# Patient Record
Sex: Female | Born: 1990 | Race: White | Hispanic: No | Marital: Married | Smoking: Never smoker
Health system: Southern US, Community
[De-identification: ages and names within clinical notes are randomized; demographics above are authoritative.]

## PROBLEM LIST (undated history)

## (undated) ENCOUNTER — Inpatient Hospital Stay (HOSPITAL_COMMUNITY): Payer: Self-pay

## (undated) DIAGNOSIS — I10 Essential (primary) hypertension: Secondary | ICD-10-CM

## (undated) DIAGNOSIS — J45909 Unspecified asthma, uncomplicated: Secondary | ICD-10-CM

## (undated) DIAGNOSIS — E669 Obesity, unspecified: Secondary | ICD-10-CM

## (undated) DIAGNOSIS — F419 Anxiety disorder, unspecified: Secondary | ICD-10-CM

## (undated) DIAGNOSIS — O139 Gestational [pregnancy-induced] hypertension without significant proteinuria, unspecified trimester: Secondary | ICD-10-CM

## (undated) DIAGNOSIS — O24419 Gestational diabetes mellitus in pregnancy, unspecified control: Secondary | ICD-10-CM

## (undated) DIAGNOSIS — F32A Depression, unspecified: Secondary | ICD-10-CM

## (undated) DIAGNOSIS — D649 Anemia, unspecified: Secondary | ICD-10-CM

## (undated) DIAGNOSIS — F329 Major depressive disorder, single episode, unspecified: Secondary | ICD-10-CM

## (undated) DIAGNOSIS — Z8619 Personal history of other infectious and parasitic diseases: Secondary | ICD-10-CM

## (undated) DIAGNOSIS — R87629 Unspecified abnormal cytological findings in specimens from vagina: Secondary | ICD-10-CM

## (undated) HISTORY — DX: Gestational diabetes mellitus in pregnancy, unspecified control: O24.419

## (undated) HISTORY — DX: Unspecified abnormal cytological findings in specimens from vagina: R87.629

## (undated) HISTORY — PX: COLPOSCOPY: SHX161

## (undated) HISTORY — DX: Personal history of other infectious and parasitic diseases: Z86.19

## (undated) HISTORY — PX: TONSILLECTOMY: SUR1361

## (undated) HISTORY — PX: HERNIA REPAIR: SHX51

## (undated) HISTORY — DX: Unspecified asthma, uncomplicated: J45.909

---
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
## 2011-09-02 LAB — OB RESULTS CONSOLE RUBELLA ANTIBODY, IGM: Rubella: IMMUNE

## 2011-09-02 LAB — OB RESULTS CONSOLE ABO/RH: RH Type: POSITIVE

## 2011-09-02 LAB — OB RESULTS CONSOLE HIV ANTIBODY (ROUTINE TESTING): HIV: NONREACTIVE

## 2011-09-02 LAB — OB RESULTS CONSOLE HEPATITIS B SURFACE ANTIGEN: Hepatitis B Surface Ag: NEGATIVE

## 2012-02-23 ENCOUNTER — Encounter (HOSPITAL_COMMUNITY): Payer: Self-pay | Admitting: *Deleted

## 2012-02-23 ENCOUNTER — Inpatient Hospital Stay (HOSPITAL_COMMUNITY)
Admission: AD | Admit: 2012-02-23 | Discharge: 2012-02-23 | Disposition: A | Discharge status: Home / Self Care | Payer: 59 | Source: Ambulatory Visit | Attending: Obstetrics and Gynecology | Admitting: Obstetrics and Gynecology

## 2012-02-23 DIAGNOSIS — O133 Gestational [pregnancy-induced] hypertension without significant proteinuria, third trimester: Secondary | ICD-10-CM

## 2012-02-23 DIAGNOSIS — R51 Headache: Secondary | ICD-10-CM | POA: Insufficient documentation

## 2012-02-23 DIAGNOSIS — O139 Gestational [pregnancy-induced] hypertension without significant proteinuria, unspecified trimester: Secondary | ICD-10-CM

## 2012-02-23 HISTORY — DX: Anemia, unspecified: D64.9

## 2012-02-23 HISTORY — DX: Gestational (pregnancy-induced) hypertension without significant proteinuria, unspecified trimester: O13.9

## 2012-02-23 HISTORY — DX: Obesity, unspecified: E66.9

## 2012-02-23 LAB — COMPREHENSIVE METABOLIC PANEL
AST: 14 U/L (ref 0–37)
Albumin: 2.7 g/dL — ABNORMAL LOW (ref 3.5–5.2)
Calcium: 9 mg/dL (ref 8.4–10.5)
Creatinine, Ser: 0.53 mg/dL (ref 0.50–1.10)
Total Protein: 6.9 g/dL (ref 6.0–8.3)

## 2012-02-23 LAB — CBC
MCH: 25.5 pg — ABNORMAL LOW (ref 26.0–34.0)
MCV: 76.8 fL — ABNORMAL LOW (ref 78.0–100.0)
Platelets: 246 10*3/uL (ref 150–400)
RDW: 15.3 % (ref 11.5–15.5)

## 2012-02-23 LAB — URINALYSIS, ROUTINE W REFLEX MICROSCOPIC
Bilirubin Urine: NEGATIVE
Protein, ur: NEGATIVE mg/dL
Urobilinogen, UA: 0.2 mg/dL (ref 0.0–1.0)

## 2012-02-23 LAB — URINE MICROSCOPIC-ADD ON

## 2012-02-23 MED ORDER — OXYCODONE-ACETAMINOPHEN 5-325 MG PO TABS
2.0000 | ORAL_TABLET | Freq: Once | ORAL | Status: AC
  Administered 2012-02-23: 2 via ORAL
  Filled 2012-02-23: qty 2

## 2012-02-23 MED ORDER — OXYCODONE-ACETAMINOPHEN 5-325 MG PO TABS: 2.0000 | ORAL_TABLET | Freq: Once | ORAL | Status: DC

## 2012-02-23 NOTE — MAU Note (Signed)
Has been having headaches today.  Checked bp twice and it was up, 160's over 90's.  Denies visual changes or epigastric pain.  Increased swelling noted in feet.

## 2012-02-23 NOTE — Progress Notes (Signed)
Pt states she was treated for depression in middle school

## 2012-02-23 NOTE — MAU Note (Signed)
Pt states she woke up with a headache yesterday. Pt states she still has a headache. Pt checked her blood pressure after lunch and it was 160/96

## 2012-02-23 NOTE — MAU Provider Note (Signed)
History     CSN: [id number]  Arrival date and time: 02/23/12 1533   First Provider Initiated Contact with Patient 02/23/12 1615      Chief Complaint  Patient presents with  . Hypertension   HPI This is a 22 y.o. female at [redacted]w[redacted]d who presents for evaluation of elevated BPs.  She has had a headache today unresponsive to Tylenol. Has some increase in feet swelling. No upper abdominal pain.  RN Note: Pt states she woke up with a headache yesterday. Pt states she still has a headache. Pt checked her blood pressure after lunch and it was 160/96      OB History    Grav Para Term Preterm Abortions TAB SAB Ect Mult Living   1               Past Medical History  Diagnosis Date  . Pregnancy induced hypertension   . Anemia   . Obese     Past Surgical History  Procedure Date  . Tonsillectomy     Family History  Problem Relation Age of Onset  . Anemia Mother     History  Substance Use Topics  . Smoking status: Not on file  . Smokeless tobacco: Not on file  . Alcohol Use:     Allergies: No Known Allergies  Prescriptions prior to admission  Medication Sig Dispense Refill  . IRON PO Take 1 tablet by mouth daily.      . Prenatal Vit-Fe Fumarate-FA (PRENATAL MULTIVITAMIN) TABS Take 1 tablet by mouth daily.        Review of Systems  Constitutional: Negative for fever, chills and malaise/fatigue.  Eyes: Negative for blurred vision.  Cardiovascular: Positive for leg swelling.  Gastrointestinal: Negative for nausea, vomiting, abdominal pain, diarrhea and constipation.  Genitourinary: Negative for dysuria.  Neurological: Positive for headaches. Negative for dizziness and weakness.   Physical Exam   Blood pressure 134/89, pulse 107, temperature 97.8 F (36.6 C), temperature source Oral, resp. rate 18, height 5' 1.75" (1.568 m), weight 281 lb (127.461 kg). Filed Vitals:   02/23/12 1646 02/23/12 1705 02/23/12 1716 02/23/12 1731  BP: 138/77 134/89 145/64 137/81  Pulse:  110 107 105 105  Temp:      TempSrc:      Resp:      Height:      Weight:        Physical Exam  Constitutional: She is oriented to person, place, and time. She appears well-developed and well-nourished. No distress.  HENT:  Head: Normocephalic.  Cardiovascular: Normal rate.   Respiratory: Effort normal.  GI: Soft. She exhibits no distension and no mass. There is no tenderness. There is no rebound and no guarding.  Musculoskeletal: Normal range of motion. She exhibits edema (trace).  Neurological: She is alert and oriented to person, place, and time. She has normal reflexes. She displays normal reflexes. She exhibits normal muscle tone.  Skin: Skin is warm and dry.  Psychiatric: She has a normal mood and affect.    MAU Course  Procedures  MDM Labs reviewed with Dr Tenny Craw.  Results for orders placed during the hospital encounter of 02/23/12 (from the past 24 hour(s))  URINALYSIS, ROUTINE W REFLEX MICROSCOPIC     Status: Abnormal   Collection Time   02/23/12  2:53 PM      Component Value Range   Color, Urine YELLOW  YELLOW   APPearance HAZY (*) CLEAR   Specific Gravity, Urine >1.030 (*) 1.005 - 1.030  pH 6.0  5.0 - 8.0   Glucose, UA NEGATIVE  NEGATIVE mg/dL   Hgb urine dipstick TRACE (*) NEGATIVE   Bilirubin Urine NEGATIVE  NEGATIVE   Ketones, ur NEGATIVE  NEGATIVE mg/dL   Protein, ur NEGATIVE  NEGATIVE mg/dL   Urobilinogen, UA 0.2  0.0 - 1.0 mg/dL   Nitrite NEGATIVE  NEGATIVE   Leukocytes, UA TRACE (*) NEGATIVE  URINE MICROSCOPIC-ADD ON     Status: Abnormal   Collection Time   02/23/12  2:53 PM      Component Value Range   Squamous Epithelial / LPF MANY (*) RARE   WBC, UA 3-6  <3 WBC/hpf   RBC / HPF 0-2  <3 RBC/hpf   Bacteria, UA FEW (*) RARE  CBC     Status: Abnormal   Collection Time   02/23/12  4:17 PM      Component Value Range   WBC 9.4  4.0 - 10.5 K/uL   RBC 4.19  3.87 - 5.11 MIL/uL   Hemoglobin 10.7 (*) 12.0 - 15.0 g/dL   HCT 16.1 (*) 09.6 - 04.5 %   MCV 76.8  (*) 78.0 - 100.0 fL   MCH 25.5 (*) 26.0 - 34.0 pg   MCHC 33.2  30.0 - 36.0 g/dL   RDW 40.9  81.1 - 91.4 %   Platelets 246  150 - 400 K/uL  COMPREHENSIVE METABOLIC PANEL     Status: Abnormal   Collection Time   02/23/12  4:17 PM      Component Value Range   Sodium 135  135 - 145 mEq/L   Potassium 4.1  3.5 - 5.1 mEq/L   Chloride 101  96 - 112 mEq/L   CO2 22  19 - 32 mEq/L   Glucose, Bld 105 (*) 70 - 99 mg/dL   BUN 6  6 - 23 mg/dL   Creatinine, Ser 7.82  0.50 - 1.10 mg/dL   Calcium 9.0  8.4 - 95.6 mg/dL   Total Protein 6.9  6.0 - 8.3 g/dL   Albumin 2.7 (*) 3.5 - 5.2 g/dL   AST 14  0 - 37 U/L   ALT 10  0 - 35 U/L   Alkaline Phosphatase 114  39 - 117 U/L   Total Bilirubin 0.1 (*) 0.3 - 1.2 mg/dL   GFR calc non Af Amer >90  >90 mL/min   GFR calc Af Amer >90  >90 mL/min  URIC ACID     Status: Normal   Collection Time   02/23/12  4:17 PM      Component Value Range   Uric Acid, Serum 4.9  2.4 - 7.0 mg/dL     Assessment and Plan  A:  SIUP at [redacted]w[redacted]d       Gestational Hypertension      Headache  P:  Discussed with Dr Tenny Craw       Will treat HA with Percocet       24 hour urine to be turned in Monday at office      Preeclampsia precautions  [facility] 02/23/2012, 5:40 PM

## 2012-02-25 LAB — URINE CULTURE

## 2012-02-28 ENCOUNTER — Encounter (HOSPITAL_COMMUNITY): Payer: Self-pay | Admitting: *Deleted

## 2012-02-28 ENCOUNTER — Inpatient Hospital Stay (HOSPITAL_COMMUNITY)
Admission: AD | Admit: 2012-02-28 | Discharge: 2012-02-28 | Disposition: A | Discharge status: Home / Self Care | Payer: 59 | Source: Ambulatory Visit | Attending: Obstetrics and Gynecology | Admitting: Obstetrics and Gynecology

## 2012-02-28 DIAGNOSIS — O219 Vomiting of pregnancy, unspecified: Secondary | ICD-10-CM

## 2012-02-28 DIAGNOSIS — O99891 Other specified diseases and conditions complicating pregnancy: Secondary | ICD-10-CM | POA: Insufficient documentation

## 2012-02-28 DIAGNOSIS — R51 Headache: Secondary | ICD-10-CM | POA: Insufficient documentation

## 2012-02-28 DIAGNOSIS — O212 Late vomiting of pregnancy: Secondary | ICD-10-CM | POA: Insufficient documentation

## 2012-02-28 DIAGNOSIS — O26893 Other specified pregnancy related conditions, third trimester: Secondary | ICD-10-CM

## 2012-02-28 LAB — COMPREHENSIVE METABOLIC PANEL
ALT: 10 U/L (ref 0–35)
AST: 13 U/L (ref 0–37)
Alkaline Phosphatase: 109 U/L (ref 39–117)
CO2: 22 mEq/L (ref 19–32)
Chloride: 104 mEq/L (ref 96–112)
Creatinine, Ser: 0.58 mg/dL (ref 0.50–1.10)
GFR calc non Af Amer: >90 mL/min (ref >90)
Total Bilirubin: 0.1 mg/dL — ABNORMAL LOW (ref 0.3–1.2)

## 2012-02-28 LAB — CBC
HCT: 30.2 % — ABNORMAL LOW (ref 36.0–46.0)
MCV: 77.4 fL — ABNORMAL LOW (ref 78.0–100.0)
Platelets: 212 10*3/uL (ref 150–400)
RBC: 3.9 MIL/uL (ref 3.87–5.11)
WBC: 8.6 10*3/uL (ref 4.0–10.5)

## 2012-02-28 LAB — CREATININE CLEARANCE, URINE, 24 HOUR
Collection Interval-CRCL: 24 hours
Creatinine Clearance: 189 mL/min — ABNORMAL HIGH (ref 75–115)
Creatinine, 24H Ur: 1577 mg/d (ref 700–1800)
Creatinine, Urine: 150.15 mg/dL
Creatinine: 0.58 mg/dL (ref 0.50–1.10)

## 2012-02-28 LAB — PROTEIN, URINE, 24 HOUR: Collection Interval-UPROT: 24 hours

## 2012-02-28 MED ORDER — DEXTROSE 5 % IN LACTATED RINGERS IV BOLUS
500.0000 mL | Freq: Once | INTRAVENOUS | Status: AC
  Administered 2012-02-28: 1000 mL via INTRAVENOUS

## 2012-02-28 MED ORDER — ONDANSETRON 8 MG/NS 50 ML IVPB
8.0000 mg | Freq: Once | INTRAVENOUS | Status: AC
  Administered 2012-02-28: 8 mg via INTRAVENOUS
  Filled 2012-02-28: qty 8

## 2012-02-28 NOTE — MAU Provider Note (Signed)
  History     CSN: [id number]  Arrival date and time: 02/28/12 1520   None     Chief Complaint  Patient presents with  . Headache  . Hypertension   HPI Marilyn Cooper is 22 y.o. G1P0 [redacted]w[redacted]d weeks presenting with nausea and vomiting, headache.  She has collected a 24 hr urine which she brought with her.  She states she spoke to Dr. Henderson Cloud and she sent her here for blood work.  Patient perceives fetal movement.  Denies leaking of fluid or vaginal bleeding.  Photophobic.  Patient was seen in the office today by Dr. Henderson Cloud.   Past Medical History  Diagnosis Date  . Pregnancy induced hypertension   . Anemia   . Obese     Past Surgical History  Procedure Laterality Date  . Tonsillectomy      Family History  Problem Relation Age of Onset  . Anemia Mother     History  Substance Use Topics  . Smoking status: Not on file  . Smokeless tobacco: Not on file  . Alcohol Use: Not on file    Allergies: No Known Allergies  Prescriptions prior to admission  Medication Sig Dispense Refill  . IRON PO Take 1 tablet by mouth daily.      Marland Kitchen oxyCODONE-acetaminophen (PERCOCET/ROXICET) 5-325 MG per tablet Take 2 tablets by mouth once.  20 tablet  0  . Prenatal Vit-Fe Fumarate-FA (PRENATAL MULTIVITAMIN) TABS Take 1 tablet by mouth daily.        ROS Physical Exam   There were no vitals taken for this visit.  Physical Exam  MAU Course  Procedures  MDM 16:25  Reported MSE to Dr. Henderson Cloud.  She said she had called in orders and that she had been see in office today. 20:15  Belinda, RN asked that I discharge the patient.  The labs results were reported by [facility] to Dr. Henderson Cloud.  Patient is to follow up in the office on Monday.  Assessment and Plan  A;  PIH ruled out at [redacted]w[redacted]d  P:  Follow up with Dr. Henderson Cloud on Monday   Azrael Huss,EVE M 02/28/2012, 4:16 PM

## 2012-02-28 NOTE — MAU Note (Signed)
Pt states she has been having elevated bp off and on . Pt complains of headache

## 2012-03-06 ENCOUNTER — Encounter (HOSPITAL_COMMUNITY): Payer: Self-pay | Admitting: *Deleted

## 2012-03-06 ENCOUNTER — Telehealth (HOSPITAL_COMMUNITY): Payer: Self-pay | Admitting: *Deleted

## 2012-03-06 NOTE — Telephone Encounter (Signed)
Preadmission screen  

## 2012-03-08 ENCOUNTER — Inpatient Hospital Stay (HOSPITAL_COMMUNITY)
Admission: RE | Admit: 2012-03-08 | Discharge: 2012-03-12 | DRG: 766 | Disposition: A | Discharge status: Home / Self Care | Payer: 59 | Source: Ambulatory Visit | Attending: Obstetrics and Gynecology | Admitting: Obstetrics and Gynecology

## 2012-03-08 ENCOUNTER — Encounter (HOSPITAL_COMMUNITY): Payer: Self-pay

## 2012-03-08 DIAGNOSIS — O139 Gestational [pregnancy-induced] hypertension without significant proteinuria, unspecified trimester: Principal | ICD-10-CM | POA: Diagnosis present

## 2012-03-08 DIAGNOSIS — E669 Obesity, unspecified: Secondary | ICD-10-CM | POA: Diagnosis present

## 2012-03-08 LAB — COMPREHENSIVE METABOLIC PANEL
ALT: 8 U/L (ref 0–35)
AST: 16 U/L (ref 0–37)
Alkaline Phosphatase: 130 U/L — ABNORMAL HIGH (ref 39–117)
CO2: 18 mEq/L — ABNORMAL LOW (ref 19–32)
Calcium: 9.2 mg/dL (ref 8.4–10.5)
Potassium: 4.2 mEq/L (ref 3.5–5.1)
Sodium: 136 mEq/L (ref 135–145)
Total Protein: 6.6 g/dL (ref 6.0–8.3)

## 2012-03-08 LAB — CBC
HCT: 33.2 % — ABNORMAL LOW (ref 36.0–46.0)
Hemoglobin: 10.7 g/dL — ABNORMAL LOW (ref 12.0–15.0)
MCHC: 32.2 g/dL (ref 30.0–36.0)
RDW: 15.7 % — ABNORMAL HIGH (ref 11.5–15.5)
WBC: 11 10*3/uL — ABNORMAL HIGH (ref 4.0–10.5)

## 2012-03-08 MED ORDER — LACTATED RINGERS IV SOLN
INTRAVENOUS | Status: DC
  Administered 2012-03-08 – 2012-03-09 (×3): 125 mL/h via INTRAVENOUS

## 2012-03-08 MED ORDER — ACETAMINOPHEN 325 MG PO TABS: 650.0000 mg | ORAL_TABLET | ORAL | Status: DC | PRN

## 2012-03-08 MED ORDER — MISOPROSTOL 25 MCG QUARTER TABLET
25.0000 ug | ORAL_TABLET | ORAL | Status: DC | PRN
  Administered 2012-03-08: 25 ug via VAGINAL
  Filled 2012-03-08: qty 0.25

## 2012-03-08 MED ORDER — ZOLPIDEM TARTRATE 5 MG PO TABS
5.0000 mg | ORAL_TABLET | Freq: Every evening | ORAL | Status: DC | PRN
  Administered 2012-03-08: 5 mg via ORAL
  Filled 2012-03-08: qty 1

## 2012-03-08 MED ORDER — CITRIC ACID-SODIUM CITRATE 334-500 MG/5ML PO SOLN
30.0000 mL | ORAL | Status: DC | PRN
  Administered 2012-03-08 – 2012-03-09 (×2): 30 mL via ORAL
  Filled 2012-03-08 (×2): qty 15

## 2012-03-08 MED ORDER — ONDANSETRON HCL 4 MG/2ML IJ SOLN: 4.0000 mg | Freq: Four times a day (QID) | INTRAMUSCULAR | Status: DC | PRN

## 2012-03-08 MED ORDER — LACTATED RINGERS IV SOLN
500.0000 mL | INTRAVENOUS | Status: DC | PRN
  Administered 2012-03-09: 1000 mL via INTRAVENOUS
  Administered 2012-03-09: 300 mL via INTRAVENOUS
  Administered 2012-03-09: 1000 mL via INTRAVENOUS

## 2012-03-08 MED ORDER — OXYTOCIN 40 UNITS IN LACTATED RINGERS INFUSION - SIMPLE MED: 62.5000 mL/h | INTRAVENOUS | Status: DC

## 2012-03-08 MED ORDER — LIDOCAINE HCL (PF) 1 % IJ SOLN: 30.0000 mL | INTRAMUSCULAR | Status: DC | PRN

## 2012-03-08 MED ORDER — IBUPROFEN 600 MG PO TABS: 600.0000 mg | ORAL_TABLET | Freq: Four times a day (QID) | ORAL | Status: DC | PRN

## 2012-03-08 MED ORDER — OXYTOCIN BOLUS FROM INFUSION: 500.0000 mL | INTRAVENOUS | Status: DC

## 2012-03-08 MED ORDER — TERBUTALINE SULFATE 1 MG/ML IJ SOLN: 0.2500 mg | Freq: Once | INTRAMUSCULAR | Status: AC | PRN

## 2012-03-08 MED ORDER — OXYCODONE-ACETAMINOPHEN 5-325 MG PO TABS: 1.0000 | ORAL_TABLET | ORAL | Status: DC | PRN

## 2012-03-08 MED ORDER — FLEET ENEMA 7-19 GM/118ML RE ENEM: 1.0000 | ENEMA | RECTAL | Status: DC | PRN

## 2012-03-08 NOTE — Progress Notes (Signed)
MD on the phone and notified of pt status, FHR, deceleration, SVE, and maternal BPs. Orders received. Will continue to monitor.

## 2012-03-08 NOTE — H&P (Signed)
22 y.o. [redacted]w[redacted]d  G4P0030 comes in for scheduled induction for gestational hypertension.  Pt had multiple elevated BPs in first trimester but none documented less that a week apart, so suspect chronic hypertension.  Denies HA/Vision changes/RUQ pain or other complaints.  Otherwise has good fetal movement and no bleeding.  Past Medical History  Diagnosis Date  . Pregnancy induced hypertension   . Anemia   . Obese   . Hx of varicella   . Asthma     childhood    Past Surgical History  Procedure Laterality Date  . Tonsillectomy      OB History   Grav Para Term Preterm Abortions TAB SAB Ect Mult Living   4 0   3 3         # Outc Date GA Lbr Len/2nd Wgt Sex Del Anes PTL Lv   1 TAB 2011           2 TAB 2012           3 TAB 2013           4 CUR               History   Social History  . Marital Status: Single    Spouse Name: N/A    Number of Children: N/A  . Years of Education: N/A   Occupational History  . Not on file.   Social History Main Topics  . Smoking status: Never Smoker   . Smokeless tobacco: Never Used  . Alcohol Use: No  . Drug Use: No  . Sexually Active: Yes   Other Topics Concern  . Not on file   Social History Narrative  . No narrative on file   Review of patient's allergies indicates no known allergies.    Prenatal Transfer Tool  Maternal Diabetes: No Genetic Screening: Declined Fetal Ultrasounds or other Referrals:  Other: anatomy scan normal Maternal Substance Abuse:  No Significant Maternal Medications:  None Significant Maternal Lab Results: Lab values include: Group B Strep negative  Other PNC: Labs done 02/28/12: ALT/AST:10/13, Plt 212, 24hr urine protein 168    Filed Vitals:   03/08/12 2009  BP: 146/93  Pulse: 114  Temp:   Resp:      Lungs/Cor:  NAD Abdomen:  soft, gravid Ex:  no cords, erythema SVE:  1/50/-3/posterior FHTs: 150 , min to mod var, +accesl, one shallow variable at start of monitoring Toco:  absent   A/P   Admit  to L&D for scheduled IOL for GHTN  Repeat CBC/CMP  Routine care  GBS Neg  Chandni Gagan

## 2012-03-09 ENCOUNTER — Inpatient Hospital Stay (HOSPITAL_COMMUNITY): Payer: 59 | Admitting: Anesthesiology

## 2012-03-09 ENCOUNTER — Encounter (HOSPITAL_COMMUNITY)
Admission: RE | Disposition: A | Discharge status: Home / Self Care | Payer: Self-pay | Source: Ambulatory Visit | Attending: Obstetrics and Gynecology

## 2012-03-09 ENCOUNTER — Encounter (HOSPITAL_COMMUNITY): Payer: Self-pay | Admitting: Anesthesiology

## 2012-03-09 ENCOUNTER — Encounter (HOSPITAL_COMMUNITY): Payer: Self-pay

## 2012-03-09 LAB — CBC
HCT: 31.7 % — ABNORMAL LOW (ref 36.0–46.0)
Hemoglobin: 10.5 g/dL — ABNORMAL LOW (ref 12.0–15.0)
MCV: 76 fL — ABNORMAL LOW (ref 78.0–100.0)
Platelets: 236 10*3/uL (ref 150–400)
RBC: 4.17 MIL/uL (ref 3.87–5.11)
WBC: 12.2 10*3/uL — ABNORMAL HIGH (ref 4.0–10.5)

## 2012-03-09 LAB — RPR: RPR Ser Ql: NONREACTIVE

## 2012-03-09 SURGERY — Surgical Case
Anesthesia: Regional | Wound class: Clean Contaminated

## 2012-03-09 MED ORDER — OXYTOCIN 40 UNITS IN LACTATED RINGERS INFUSION - SIMPLE MED: 1.0000 m[IU]/min | INTRAVENOUS | Status: DC

## 2012-03-09 MED ORDER — PRENATAL MULTIVITAMIN CH
1.0000 | ORAL_TABLET | Freq: Every day | ORAL | Status: DC
  Administered 2012-03-11 – 2012-03-12 (×2): 1 via ORAL
  Filled 2012-03-09 (×2): qty 1

## 2012-03-09 MED ORDER — KETOROLAC TROMETHAMINE 30 MG/ML IJ SOLN: 30.0000 mg | Freq: Four times a day (QID) | INTRAMUSCULAR | Status: DC | PRN

## 2012-03-09 MED ORDER — LIDOCAINE HCL (PF) 1 % IJ SOLN
INTRAMUSCULAR | Status: DC | PRN
  Administered 2012-03-09 (×2): 5 mL

## 2012-03-09 MED ORDER — OXYTOCIN 10 UNIT/ML IJ SOLN
INTRAMUSCULAR | Status: AC
  Filled 2012-03-09: qty 4

## 2012-03-09 MED ORDER — NALOXONE HCL 1 MG/ML IJ SOLN
1.0000 ug/kg/h | INTRAVENOUS | Status: DC | PRN
  Filled 2012-03-09: qty 2

## 2012-03-09 MED ORDER — ONDANSETRON HCL 4 MG PO TABS: 4.0000 mg | ORAL_TABLET | ORAL | Status: DC | PRN

## 2012-03-09 MED ORDER — KETOROLAC TROMETHAMINE 30 MG/ML IJ SOLN
30.0000 mg | Freq: Four times a day (QID) | INTRAMUSCULAR | Status: DC | PRN
Start: 2012-03-09 — End: 2012-03-09

## 2012-03-09 MED ORDER — MORPHINE SULFATE (PF) 0.5 MG/ML IJ SOLN
INTRAMUSCULAR | Status: DC | PRN
  Administered 2012-03-09: 4 mg via EPIDURAL

## 2012-03-09 MED ORDER — METOCLOPRAMIDE HCL 5 MG/ML IJ SOLN: 10.0000 mg | Freq: Three times a day (TID) | INTRAMUSCULAR | Status: DC | PRN

## 2012-03-09 MED ORDER — KETOROLAC TROMETHAMINE 60 MG/2ML IM SOLN
INTRAMUSCULAR | Status: AC
  Administered 2012-03-09: 60 mg via INTRAMUSCULAR
  Filled 2012-03-09: qty 2

## 2012-03-09 MED ORDER — LIDOCAINE-EPINEPHRINE (PF) 2 %-1:200000 IJ SOLN
INTRAMUSCULAR | Status: AC
  Filled 2012-03-09: qty 20

## 2012-03-09 MED ORDER — DIPHENHYDRAMINE HCL 50 MG/ML IJ SOLN: 12.5000 mg | INTRAMUSCULAR | Status: DC | PRN

## 2012-03-09 MED ORDER — LACTATED RINGERS IV SOLN
INTRAVENOUS | Status: DC
  Administered 2012-03-09 – 2012-03-10 (×2): via INTRAVENOUS

## 2012-03-09 MED ORDER — EPHEDRINE 5 MG/ML INJ
10.0000 mg | INTRAVENOUS | Status: DC | PRN
  Filled 2012-03-09: qty 4

## 2012-03-09 MED ORDER — ZOLPIDEM TARTRATE 5 MG PO TABS: 5.0000 mg | ORAL_TABLET | Freq: Every evening | ORAL | Status: DC | PRN

## 2012-03-09 MED ORDER — NALOXONE HCL 0.4 MG/ML IJ SOLN: 0.4000 mg | INTRAMUSCULAR | Status: DC | PRN

## 2012-03-09 MED ORDER — MORPHINE SULFATE 0.5 MG/ML IJ SOLN
INTRAMUSCULAR | Status: AC
  Filled 2012-03-09: qty 10

## 2012-03-09 MED ORDER — SIMETHICONE 80 MG PO CHEW: 80.0000 mg | CHEWABLE_TABLET | ORAL | Status: DC | PRN

## 2012-03-09 MED ORDER — OXYCODONE-ACETAMINOPHEN 5-325 MG PO TABS
1.0000 | ORAL_TABLET | ORAL | Status: DC | PRN
  Administered 2012-03-10 – 2012-03-11 (×4): 1 via ORAL
  Administered 2012-03-11: 2 via ORAL
  Administered 2012-03-11: 1 via ORAL
  Administered 2012-03-11: 2 via ORAL
  Administered 2012-03-12: 1 via ORAL
  Filled 2012-03-09: qty 1
  Filled 2012-03-09: qty 2
  Filled 2012-03-09 (×2): qty 1
  Filled 2012-03-09: qty 2
  Filled 2012-03-09 (×2): qty 1

## 2012-03-09 MED ORDER — OXYTOCIN 40 UNITS IN LACTATED RINGERS INFUSION - SIMPLE MED: 62.5000 mL/h | INTRAVENOUS | Status: AC

## 2012-03-09 MED ORDER — OXYTOCIN 10 UNIT/ML IJ SOLN
40.0000 [IU] | INTRAVENOUS | Status: DC | PRN
  Administered 2012-03-09: 40 [IU] via INTRAVENOUS

## 2012-03-09 MED ORDER — LANOLIN HYDROUS EX OINT: 1.0000 "application " | TOPICAL_OINTMENT | CUTANEOUS | Status: DC | PRN

## 2012-03-09 MED ORDER — SODIUM BICARBONATE 8.4 % IV SOLN
INTRAVENOUS | Status: DC | PRN
  Administered 2012-03-09: 5 mL via EPIDURAL

## 2012-03-09 MED ORDER — SCOPOLAMINE 1 MG/3DAYS TD PT72: 1.0000 | MEDICATED_PATCH | Freq: Once | TRANSDERMAL | Status: DC

## 2012-03-09 MED ORDER — CEFAZOLIN SODIUM 1-5 GM-% IV SOLN
1.0000 g | Freq: Once | INTRAVENOUS | Status: DC
  Filled 2012-03-09: qty 50

## 2012-03-09 MED ORDER — CEFAZOLIN SODIUM-DEXTROSE 2-3 GM-% IV SOLR
INTRAVENOUS | Status: AC
  Filled 2012-03-09: qty 50

## 2012-03-09 MED ORDER — WITCH HAZEL-GLYCERIN EX PADS: 1.0000 "application " | MEDICATED_PAD | CUTANEOUS | Status: DC | PRN

## 2012-03-09 MED ORDER — PHENYLEPHRINE 40 MCG/ML (10ML) SYRINGE FOR IV PUSH (FOR BLOOD PRESSURE SUPPORT): 80.0000 ug | PREFILLED_SYRINGE | INTRAVENOUS | Status: DC | PRN

## 2012-03-09 MED ORDER — KETOROLAC TROMETHAMINE 60 MG/2ML IM SOLN: 60.0000 mg | Freq: Once | INTRAMUSCULAR | Status: AC | PRN

## 2012-03-09 MED ORDER — SODIUM CHLORIDE 0.9 % IJ SOLN: 3.0000 mL | INTRAMUSCULAR | Status: DC | PRN

## 2012-03-09 MED ORDER — MENTHOL 3 MG MT LOZG: 1.0000 | LOZENGE | OROMUCOSAL | Status: DC | PRN

## 2012-03-09 MED ORDER — CEFAZOLIN SODIUM 10 G IJ SOLR
3.0000 g | INTRAMUSCULAR | Status: DC | PRN
  Administered 2012-03-09: 2 g via INTRAVENOUS

## 2012-03-09 MED ORDER — ONDANSETRON HCL 4 MG/2ML IJ SOLN
4.0000 mg | INTRAMUSCULAR | Status: DC | PRN
  Administered 2012-03-09: 4 mg via INTRAVENOUS
  Filled 2012-03-09: qty 2

## 2012-03-09 MED ORDER — DIPHENHYDRAMINE HCL 50 MG/ML IJ SOLN: 25.0000 mg | INTRAMUSCULAR | Status: DC | PRN

## 2012-03-09 MED ORDER — ONDANSETRON HCL 4 MG/2ML IJ SOLN: 4.0000 mg | Freq: Three times a day (TID) | INTRAMUSCULAR | Status: DC | PRN

## 2012-03-09 MED ORDER — FENTANYL 2.5 MCG/ML BUPIVACAINE 1/10 % EPIDURAL INFUSION (WH - ANES)
14.0000 mL/h | INTRAMUSCULAR | Status: DC
  Administered 2012-03-09: 14 mL/h via EPIDURAL
  Filled 2012-03-09: qty 125

## 2012-03-09 MED ORDER — PHENYLEPHRINE 40 MCG/ML (10ML) SYRINGE FOR IV PUSH (FOR BLOOD PRESSURE SUPPORT)
80.0000 ug | PREFILLED_SYRINGE | INTRAVENOUS | Status: DC | PRN
  Filled 2012-03-09: qty 5

## 2012-03-09 MED ORDER — MEPERIDINE HCL 25 MG/ML IJ SOLN: 6.2500 mg | INTRAMUSCULAR | Status: DC | PRN

## 2012-03-09 MED ORDER — DIBUCAINE 1 % RE OINT: 1.0000 "application " | TOPICAL_OINTMENT | RECTAL | Status: DC | PRN

## 2012-03-09 MED ORDER — DIPHENHYDRAMINE HCL 25 MG PO CAPS
25.0000 mg | ORAL_CAPSULE | ORAL | Status: DC | PRN
  Filled 2012-03-09: qty 1

## 2012-03-09 MED ORDER — FENTANYL CITRATE 0.05 MG/ML IJ SOLN: 25.0000 ug | INTRAMUSCULAR | Status: DC | PRN

## 2012-03-09 MED ORDER — IBUPROFEN 600 MG PO TABS
600.0000 mg | ORAL_TABLET | Freq: Four times a day (QID) | ORAL | Status: DC
  Administered 2012-03-10 – 2012-03-12 (×9): 600 mg via ORAL
  Filled 2012-03-09 (×10): qty 1

## 2012-03-09 MED ORDER — ONDANSETRON HCL 4 MG/2ML IJ SOLN
INTRAMUSCULAR | Status: DC | PRN
  Administered 2012-03-09: 4 mg via INTRAVENOUS

## 2012-03-09 MED ORDER — NALBUPHINE SYRINGE 5 MG/0.5 ML
5.0000 mg | INJECTION | INTRAMUSCULAR | Status: DC | PRN
  Filled 2012-03-09: qty 1

## 2012-03-09 MED ORDER — LACTATED RINGERS IV SOLN: 500.0000 mL | Freq: Once | INTRAVENOUS | Status: DC

## 2012-03-09 MED ORDER — SODIUM BICARBONATE 8.4 % IV SOLN
INTRAVENOUS | Status: AC
  Filled 2012-03-09: qty 50

## 2012-03-09 MED ORDER — OXYTOCIN 40 UNITS IN LACTATED RINGERS INFUSION - SIMPLE MED
1.0000 m[IU]/min | INTRAVENOUS | Status: DC
  Administered 2012-03-09: 1 m[IU]/min via INTRAVENOUS
  Filled 2012-03-09: qty 1000

## 2012-03-09 MED ORDER — EPHEDRINE 5 MG/ML INJ: 10.0000 mg | INTRAVENOUS | Status: DC | PRN

## 2012-03-09 MED ORDER — SIMETHICONE 80 MG PO CHEW
80.0000 mg | CHEWABLE_TABLET | Freq: Three times a day (TID) | ORAL | Status: DC
  Administered 2012-03-10 – 2012-03-12 (×8): 80 mg via ORAL

## 2012-03-09 MED ORDER — IBUPROFEN 600 MG PO TABS: 600.0000 mg | ORAL_TABLET | Freq: Four times a day (QID) | ORAL | Status: DC | PRN

## 2012-03-09 MED ORDER — LACTATED RINGERS IV SOLN
INTRAVENOUS | Status: DC | PRN
  Administered 2012-03-09 (×3): via INTRAVENOUS

## 2012-03-09 MED ORDER — DIPHENHYDRAMINE HCL 25 MG PO CAPS: 25.0000 mg | ORAL_CAPSULE | Freq: Four times a day (QID) | ORAL | Status: DC | PRN

## 2012-03-09 MED ORDER — SENNOSIDES-DOCUSATE SODIUM 8.6-50 MG PO TABS
2.0000 | ORAL_TABLET | Freq: Every day | ORAL | Status: DC
  Administered 2012-03-10 – 2012-03-11 (×2): 2 via ORAL

## 2012-03-09 MED ORDER — TETANUS-DIPHTH-ACELL PERTUSSIS 5-2.5-18.5 LF-MCG/0.5 IM SUSP
0.5000 mL | Freq: Once | INTRAMUSCULAR | Status: AC
  Administered 2012-03-11: 0.5 mL via INTRAMUSCULAR

## 2012-03-09 MED ORDER — ONDANSETRON HCL 4 MG/2ML IJ SOLN
INTRAMUSCULAR | Status: AC
  Filled 2012-03-09: qty 2

## 2012-03-09 SURGICAL SUPPLY — 38 items
ADH SKN CLS APL DERMABOND .7 (GAUZE/BANDAGES/DRESSINGS) ×2
CLOTH BEACON ORANGE TIMEOUT ST (SAFETY) ×2 IMPLANT
DERMABOND ADVANCED (GAUZE/BANDAGES/DRESSINGS) ×2
DERMABOND ADVANCED .7 DNX12 (GAUZE/BANDAGES/DRESSINGS) IMPLANT
DRAPE LG THREE QUARTER DISP (DRAPES) ×2 IMPLANT
DRESSING TELFA 8X3 (GAUZE/BANDAGES/DRESSINGS) ×2 IMPLANT
DRSG OPSITE POSTOP 4X10 (GAUZE/BANDAGES/DRESSINGS) ×2 IMPLANT
DRSG OPSITE POSTOP 4X12 (GAUZE/BANDAGES/DRESSINGS) ×1 IMPLANT
DURAPREP 26ML APPLICATOR (WOUND CARE) ×2 IMPLANT
ELECT REM PT RETURN 9FT ADLT (ELECTROSURGICAL) ×2
ELECTRODE REM PT RTRN 9FT ADLT (ELECTROSURGICAL) ×1 IMPLANT
EXTRACTOR VACUUM M CUP 4 TUBE (SUCTIONS) IMPLANT
GAUZE SPONGE 4X4 12PLY STRL LF (GAUZE/BANDAGES/DRESSINGS) ×4 IMPLANT
GLOVE BIO SURGEON STRL SZ7 (GLOVE) ×2 IMPLANT
GLOVE SS N UNI LF 6.5 STRL (GLOVE) ×1 IMPLANT
GLOVE SS N UNI LF 7.0 STRL (GLOVE) ×1 IMPLANT
GLOVE SS N UNI LF 7.5 STRL (GLOVE) ×1 IMPLANT
GOWN PREVENTION PLUS LG XLONG (DISPOSABLE) ×4 IMPLANT
KIT ABG SYR 3ML LUER SLIP (SYRINGE) ×1 IMPLANT
NDL HYPO 25X5/8 SAFETYGLIDE (NEEDLE) IMPLANT
NEEDLE HYPO 25X5/8 SAFETYGLIDE (NEEDLE) ×2 IMPLANT
NS IRRIG 1000ML POUR BTL (IV SOLUTION) ×2 IMPLANT
PACK C SECTION WH (CUSTOM PROCEDURE TRAY) ×2 IMPLANT
PAD ABD 7.5X8 STRL (GAUZE/BANDAGES/DRESSINGS) ×2 IMPLANT
PAD OB MATERNITY 4.3X12.25 (PERSONAL CARE ITEMS) ×2 IMPLANT
RETAINER VISCERAL (MISCELLANEOUS) ×1 IMPLANT
RTRCTR C-SECT PINK 25CM LRG (MISCELLANEOUS) ×2 IMPLANT
SLEEVE SCD COMPRESS KNEE MED (MISCELLANEOUS) IMPLANT
STAPLER VISISTAT 35W (STAPLE) IMPLANT
SUT CHROMIC 1 CTX 36 (SUTURE) ×6 IMPLANT
SUT PDS AB 0 CTX 60 (SUTURE) ×3 IMPLANT
SUT PLAIN 2 0 XLH (SUTURE) ×1 IMPLANT
SUT VIC AB 2-0 CT1 27 (SUTURE) ×2
SUT VIC AB 2-0 CT1 TAPERPNT 27 (SUTURE) ×1 IMPLANT
SUT VIC AB 4-0 KS 27 (SUTURE) ×1 IMPLANT
TOWEL OR 17X24 6PK STRL BLUE (TOWEL DISPOSABLE) ×6 IMPLANT
TRAY FOLEY CATH 14FR (SET/KITS/TRAYS/PACK) ×2 IMPLANT
WATER STERILE IRR 1000ML POUR (IV SOLUTION) ×2 IMPLANT

## 2012-03-09 NOTE — Op Note (Signed)
Pre-Operative Diagnosis: 1) 39+0 week intrauterine pregnancy 2) Non-reassuring fetal well-being remote from delivery 3) Thick meconium stained fluid Postoperative Diagnosis: Same Procedure: Primary Low Transverse Uterine Incision via Pfannenstiel Skin Incision Surgeon: Dr. Waynard Reeds Assistant: Dr. Ilda Mori Operative Findings: Female infant in vertex presentation with thick meconium stained fluid.  True knot noted in the umbilical cord. Normal ovaries, tubes, and uterus. Specimen: Placenta EBL: Total I/O In: 2300 [I.V.:2300] Out: 600 [Urine:100; Blood:500]   Procedure:Marilyn Cooper is an 22 year old gravida 4 para 1031 at 62 weeks and 0 days estimated gestational age who presents for cesarean section. The patient was admitted for induction of labor for gestational versus chronic hypertension.  She had had intermittant decelerations throughout the night.  Amniotomy was performed and internal monitors were placed.  Thick meconium stained fluid was noted.  The patient developed persistent late decelerations despite no pitocin.  Given remote from delivery and persistent late decelerations the decision was made to proceed with cesarean section for non-reassuring fetal well being. Following the appropriate informed consent the patient was brought to the operating room where spinal anesthesia was administered and found to be adequate. She was placed in the dorsal supine position with a leftward tilt. She was prepped and draped in the normal sterile fashion. Scalpel was then used to make a Pfannenstiel skin incision which was carried down to the underlying layers of soft tissue to the fascia. The fascia was incised in the midline and the fascial incision was extended laterally with Mayo scissors. The superior aspect of the fascial incision was grasped with Coker clamps x2, tented up and the rectus muscles dissected off sharply with the electrocautery unit area and the same procedure was repeated on the  inferior aspect of the fascial incision. The rectus muscles were separated in the midline. The abdominal peritoneum was identified, tented up, entered sharply, and the incision was extended superiorly and inferiorly with good visualization of the bladder. The Alexis retractor was then deployed. The vesicouterine peritoneum was identified, tented up, entered sharply, and the bladder flap was created digitally. Scalpel was then used to make a low transverse incision on the uterus which was extended laterally with blunt dissection. The fetal vertex was identified, delivered easily through the uterine incision followed by the body. The infant was bulb suctioned on the operative field, cord was clamped and cut and the infant was passed to the waiting neonatologist. Placenta was then delivered spontaneously, the uterus was cleared of all clot and debris. The uterine incision was repaired with #1 chromic in running locked fashion followed by a second imbricating layer. Ovaries and tubes were inspected and normal. The Alexis retractor was removed. The uterus was returned to the abdominal cavity the abdominal cavity was cleared of all clot and debris. The abdominal peritoneum was reapproximated with 2-0 Vicryl in a running fashion, the rectus muscles was reapproximated with #1 chromic in a running fashion. The fascia was closed with a looped PDS in a running fashion. The skin was closed with 4-0 vicryl in a subcuticular fashion and Dermabond. All sponge lap and needle counts were correct x2. Patient tolerated the procedure well and recovered in stable condition following the procedure.

## 2012-03-09 NOTE — Anesthesia Preprocedure Evaluation (Addendum)
Anesthesia Evaluation  Patient identified by MRN, date of birth, ID band Patient awake    Reviewed: Allergy & Precautions, H&P , Patient's Chart, lab work & pertinent test results  Airway Mallampati: III TM Distance: >3 FB Neck ROM: full    Dental no notable dental hx.    Pulmonary neg pulmonary ROS, asthma ,  breath sounds clear to auscultation  Pulmonary exam normal       Cardiovascular hypertension (PIH), negative cardio ROS  Rhythm:regular Rate:Normal     Neuro/Psych negative neurological ROS  negative psych ROS   GI/Hepatic negative GI ROS, Neg liver ROS,   Endo/Other  negative endocrine ROSMorbid obesity  Renal/GU negative Renal ROS     Musculoskeletal   Abdominal   Peds  Hematology negative hematology ROS (+) anemia ,   Anesthesia Other Findings   Reproductive/Obstetrics (+) Pregnancy (NRFHR --> C/S)                          Anesthesia Physical Anesthesia Plan  ASA: III and emergent  Anesthesia Plan: Epidural   Post-op Pain Management:    Induction:   Airway Management Planned:   Additional Equipment:   Intra-op Plan:   Post-operative Plan:   Informed Consent: I have reviewed the patients History and Physical, chart, labs and discussed the procedure including the risks, benefits and alternatives for the proposed anesthesia with the patient or authorized representative who has indicated his/her understanding and acceptance.     Plan Discussed with: Surgeon and CRNA  Anesthesia Plan Comments:        Anesthesia Quick Evaluation

## 2012-03-09 NOTE — Progress Notes (Signed)
After d/c of pitocin, strip was reviewed and discussed with RN.  Overall good variability with 15x15 accels noted in the past hour.  RN reports two episodes of possible subtle late decels approx 6am with good variability thereafter.  Pt has had multiple narrow variables since with one more pronounced late deceleration.  Throughout variability has been good. Instructed RN to apply oxygen as needed.  Currently the tracing is reassuring.  Request given to resume pitocin for CST.  Asked RN to reeval cervix, no change.

## 2012-03-09 NOTE — Anesthesia Postprocedure Evaluation (Signed)
  Anesthesia Post-op Note  Anesthesia Post Note  Patient: Marilyn Cooper  Procedure(s) Performed: Procedure(s) (LRB): CESAREAN SECTION (N/A)  Anesthesia type: Epidural  Patient location: PACU  Post pain: Pain level controlled  Post assessment: Post-op Vital signs reviewed  Last Vitals:  Filed Vitals:   03/09/12 1409  BP: 132/77  Pulse: 95  Temp: 37.1 C  Resp: 18    Post vital signs: stable  Level of consciousness: awake  Complications: No apparent anesthesia complications

## 2012-03-09 NOTE — Progress Notes (Signed)
MD notified of FHR tracing, decelerations and UC pattern. Orders received to discontinue pitocin and restart when tracing recovers. Will continue to monitor.

## 2012-03-09 NOTE — Transfer of Care (Signed)
Immediate Anesthesia Transfer of Care Note  Patient: Marilyn Cooper  Procedure(s) Performed: Procedure(s): CESAREAN SECTION (N/A)  Patient Location: PACU  Anesthesia Type:Epidural  Level of Consciousness: awake, alert  and oriented  Airway & Oxygen Therapy: Patient Spontanous Breathing  Post-op Assessment: Report given to PACU RN and Post -op Vital signs reviewed and stable  Post vital signs: Reviewed and stable  Complications: No apparent anesthesia complications

## 2012-03-09 NOTE — Anesthesia Procedure Notes (Signed)

## 2012-03-09 NOTE — Consult Note (Signed)
Neonatology Note:   Attendance at C-section:    I was asked to attend this primary C/S at term due to [facility]. The mother is a G4P0A3 A pos, GBS  neg with PIH and FHR decelerations. ROM 3 hours prior to delivery, fluid with thick meconium. Infant was flaccid, apneic, and with a HR of about 60 at birth. I bulb suctioned him, then intubated him with a 3.5 mm ETT. Meconium was seen welling up from between the vocal cords. We suctioned about 1 ml of meconium from the trachea, then gave vigorous stimulation. The baby opened his mouth wide as if to cry, but didn't, so PPV was applied for 3 breaths, which provided enough stimulation to get him crying. BBO2 was then given as he remained cyanotic. His color and tone improved gradually, and perfusion also improved. By 7 minutes of life, his tone was normal. Ap 2/8/9. Lungs clear to ausc in DR. Of note, there was a true knot in the cord. To CN to care of Pediatrician.   Deatra James, MD

## 2012-03-09 NOTE — Progress Notes (Signed)
MD notified of pt status, FHR, decelerations, UC pattern, SVE, pts pain level, and RN unable to place cytotec. MD states to wait and try to place cytotec. If unable to place another by 02:30 RN is to start pitocin. Will continue to monitor.

## 2012-03-09 NOTE — Progress Notes (Signed)
Asked by RN to review strip.  Episodes of minimal variability.  RN noted several decels, associated with moderate variability, not necessarily assoc with contractions.  Currently moderate variability with 15x15 noted at 12:14.  Asked RN to alert me with any further concerns of the strip.

## 2012-03-09 NOTE — Progress Notes (Signed)
MD notified of FHR, decelerations and RN interventions. MD asked to review strip. MD states she is comfortable with FHR tracing. Will continue to monitor

## 2012-03-09 NOTE — Progress Notes (Signed)
Patient ID: Marilyn Cooper, female   DOB: February 25, 1990, 22 y.o.   MRN: [id number]  S: Comfortable O: Filed Vitals:   03/09/12 0731 03/09/12 0802 03/09/12 0832 03/09/12 0902  BP: 129/77 146/67 120/94 119/64  Pulse: 97 101  94  Temp:    98.4 F (36.9 C)  TempSrc:    Oral  Resp: 18 18 18 18   Height:      Weight:       cvx 2/50/-2 FHT 140-150 reactive with accelerations toco Q4  AROM With FSE placement for thick meconium fluid IUPC placed  A/P 1) Start pitocin again 2) thick meconium fluid, plan for NICU to attend delivery 3) Overall FWB reassuring 4) Epidural on request

## 2012-03-09 NOTE — Progress Notes (Signed)
Patient ID: Marilyn Cooper, female   DOB: [birth year], 22 y.o.   MRN: [id number]   S: comfortable after epidural O: AFVSS cvc 3/80/-2, firm toco q2-4, pitocin off FHT 150 with decreased variability and repetative late decels.  Pitocin stopped, O2 on due to decreased variability and lates. Pt informed of concern for FWB.  If repetative lates persist will need to proceed with cesarean

## 2012-03-10 LAB — CBC
MCH: 25.1 pg — ABNORMAL LOW (ref 26.0–34.0)
MCHC: 32.7 g/dL (ref 30.0–36.0)
MCV: 76.8 fL — ABNORMAL LOW (ref 78.0–100.0)
Platelets: 152 10*3/uL (ref 150–400)

## 2012-03-10 NOTE — Progress Notes (Signed)
Subjective: Postpartum Day 1: Cesarean Delivery Patient reports pain controlled, bleeding appropriate  Objective: Vital signs in last 24 hours: Filed Vitals:   03/09/12 2035 03/09/12 2200 03/10/12 0000 03/10/12 0611  BP:  122/82 130/80 112/72  Pulse: 107 101 100 98  Temp:  98.6 F (37 C) 98.7 F (37.1 C) 97.9 F (36.6 C)  TempSrc:   Oral Oral  Resp: 18 20 20 20   Height:      Weight:      SpO2: 97% 97% 98% 96%    Physical Exam:  General: alert, cooperative and appears stated age 22: appropriate Uterine Fundus: firm Incision: healing well DVT Evaluation: No evidence of DVT seen on physical exam.   Recent Labs  03/09/12 0915 03/10/12 0515  HGB 10.5* 8.2*  HCT 31.7* 25.1*    Assessment/Plan: Status post Cesarean section. Doing well postoperatively.  Continue current care.   Sherre Wooton H. 03/10/2012, 9:36 AM

## 2012-03-10 NOTE — Anesthesia Postprocedure Evaluation (Signed)
  Anesthesia Post-op Note  Patient: Marilyn Cooper  Procedure(s) Performed: Procedure(s): CESAREAN SECTION (N/A)  Patient Location: PACU  Anesthesia Type:Epidural  Level of Consciousness: awake, alert  and oriented  Airway and Oxygen Therapy: Patient Spontanous Breathing  Post-op Pain: mild  Post-op Assessment: Post-op Vital signs reviewed and Patient's Cardiovascular Status Stable  Post-op Vital Signs: Reviewed and stable  Complications: No apparent anesthesia complications

## 2012-03-11 NOTE — Progress Notes (Signed)
Subjective: Postpartum Day 2: Cesarean Delivery Patient reports doing well.  Patient does notice more pain at the incision today but its now controlled with percocet  Objective: Vital signs in last 24 hours: Temp:  [97.9 F (36.6 C)-99.2 F (37.3 C)] 97.9 F (36.6 C) (02/23 0535) Pulse Rate:  [102-109] 102 (02/23 0535) Resp:  [18] 18 (02/23 0535) BP: (126-149)/(88-90) 149/88 mmHg (02/23 0535)  Physical Exam:  General: alert, cooperative and appears stated age Lochia: appropriate Uterine Fundus: firm Incision: healing well DVT Evaluation: No evidence of DVT seen on physical exam.   Recent Labs  03/09/12 0915 03/10/12 0515  HGB 10.5* 8.2*  HCT 31.7* 25.1*    Assessment/Plan: Status post Cesarean section. Doing well postoperatively.  Continue current care.  Miner Koral H. 03/11/2012, 12:02 PM

## 2012-03-12 ENCOUNTER — Encounter (HOSPITAL_COMMUNITY)
Admission: RE | Admit: 2012-03-12 | Discharge: 2012-03-12 | Disposition: A | Discharge status: Home / Self Care | Payer: 59 | Source: Ambulatory Visit | Attending: Obstetrics and Gynecology | Admitting: Obstetrics and Gynecology

## 2012-03-12 ENCOUNTER — Encounter (HOSPITAL_COMMUNITY): Payer: Self-pay | Admitting: Obstetrics and Gynecology

## 2012-03-12 DIAGNOSIS — O923 Agalactia: Secondary | ICD-10-CM | POA: Insufficient documentation

## 2012-03-12 MED ORDER — OXYCODONE-ACETAMINOPHEN 5-325 MG PO TABS: 1.0000 | ORAL_TABLET | ORAL | Status: DC | PRN

## 2012-03-12 NOTE — Discharge Summary (Signed)
Obstetric Discharge Summary Reason for Admission: induction of labor; PIH Prenatal Procedures: NST and ultrasound Intrapartum Procedures: cesarean: low cervical, transverse Postpartum Procedures: none Complications-Operative and Postpartum: none Hemoglobin  Date Value Range Status  03/10/2012 8.2* 12.0 - 15.0 g/dL Final     REPEATED TO VERIFY     DELTA CHECK NOTED     HCT  Date Value Range Status  03/10/2012 25.1* 36.0 - 46.0 % Final    Physical Exam:  General: alert Lochia: appropriate Uterine Fundus: firm Incision: healing well DVT Evaluation: No evidence of DVT seen on physical exam.  Discharge Diagnoses: Term Pregnancy-delivered, PIH, meconium fluid and indeterminate fetal heart rate tracing remote from delivery.   Discharge Information: Date: 03/12/2012 Activity: limited Diet: routine Medications: PNV, Ibuprofen and Percocet Condition: stable Instructions: refer to practice specific booklet Discharge to: home   Newborn Data: Live born female  Birth Weight: 8 lb 1.8 oz (3680 g) APGAR: 2, 8  Home with mother.  Marilyn Cooper D 03/12/2012, 9:44 AM

## 2012-04-10 ENCOUNTER — Encounter (HOSPITAL_COMMUNITY)
Admission: RE | Admit: 2012-04-10 | Discharge: 2012-04-10 | Disposition: A | Discharge status: Home / Self Care | Payer: 59 | Source: Ambulatory Visit | Attending: Obstetrics and Gynecology | Admitting: Obstetrics and Gynecology

## 2012-04-10 DIAGNOSIS — O923 Agalactia: Secondary | ICD-10-CM | POA: Insufficient documentation

## 2012-04-27 ENCOUNTER — Other Ambulatory Visit: Payer: Self-pay | Admitting: Obstetrics and Gynecology

## 2013-02-04 ENCOUNTER — Encounter: Payer: Self-pay | Admitting: Physician Assistant

## 2013-02-04 ENCOUNTER — Ambulatory Visit (INDEPENDENT_AMBULATORY_CARE_PROVIDER_SITE_OTHER): Payer: 59 | Admitting: Physician Assistant

## 2013-02-04 VITALS — BP 152/94 | HR 84 | Temp 98.2°F | Resp 18 | Ht 61.5 in | Wt 279.0 lb

## 2013-02-04 DIAGNOSIS — R635 Abnormal weight gain: Secondary | ICD-10-CM

## 2013-02-04 DIAGNOSIS — F411 Generalized anxiety disorder: Secondary | ICD-10-CM

## 2013-02-04 DIAGNOSIS — F32A Depression, unspecified: Secondary | ICD-10-CM | POA: Insufficient documentation

## 2013-02-04 LAB — BASIC METABOLIC PANEL WITH GFR
BUN: 7 mg/dL (ref 6–23)
CALCIUM: 8.9 mg/dL (ref 8.4–10.5)
CO2: 25 mEq/L (ref 19–32)
CREATININE: 0.54 mg/dL (ref 0.50–1.10)
Chloride: 106 mEq/L (ref 96–112)
GFR, Est African American: >89 mL/min
Glucose, Bld: 102 mg/dL — ABNORMAL HIGH (ref 70–99)
POTASSIUM: 4.2 meq/L (ref 3.5–5.3)
SODIUM: 140 meq/L (ref 135–145)

## 2013-02-04 LAB — TSH: TSH: 1.656 u[IU]/mL (ref 0.350–4.500)

## 2013-02-04 MED ORDER — SERTRALINE HCL 50 MG PO TABS: 50.0000 mg | ORAL_TABLET | Freq: Every day | ORAL | Status: DC

## 2013-02-04 MED ORDER — CLONAZEPAM 0.5 MG PO TABS: 0.5000 mg | ORAL_TABLET | Freq: Two times a day (BID) | ORAL | Status: DC | PRN

## 2013-02-05 NOTE — Progress Notes (Signed)
Patient ID: Marilyn Cooper MRN: [id number], DOB: 17-Sep-1990, 23 y.o. Date of Encounter: @DATE @  Chief Complaint:  Chief Complaint  Patient presents with  . discuss weight issues, anxiety    very moody, depressed    HPI: 23 y.o. year old obese white female  presents with above complaints.  She reports that she has struggled with depression for a long time. In the past has met with a therapist but has never been on any medications. In the past she was able to change her routine with exercise, food she was eating, and being involved in positive activities and avoiding things that made her sad/negative. She was able to control her symptoms with these lifestyle changes.  However she says for the past one month she has not been able to control her mood even with trying to adjust these things. Says that she "goes off "all the sudden and lashes out over nothing.  She has one child who is 3 months old. Says that she had no postpartum depression. Says her mood was stable until just this past one month.  She does report that she believes one trigger recently has been the fact that she was not able to get in to school as planned. She says that she has always been a planner and has always had goals. She had planned to go to college and to pharmacy school. This baby was unplanned. However she was able to adapt to that change. However when she recently found out that she did not get her forms in  to school in time, this  caused cause her a lot of anxiety. 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  She also says she has problems with insomnia. Says  this past Friday night she was up late at night doing laundry. She says that she gets impulses to do things and is unable to control it.  As well she is concerned about her weight. Her baby is 22  months old. Prepregnancy weight 260 pounds. After the baby she actually lost down to 240 pounds. Says that she was eating small amounts of healthy foods throughout the day. Was walking 2.2 miles every day. Now she is doing no exercise. Works at [employer] and because of her schedule she ends up eating an entire sub within a matter of minutes which is very different than the diet she was doing. Also because of her anxiety she ends up eating a lot of chips etc. In addition to the weight gain she is also concerned about developing diabetes.   Past Medical History  Diagnosis Date  . Pregnancy induced hypertension   . Anemia   . Obese   . Hx of varicella   . Asthma     childhood     Home Meds: See attached medication section for current medication list. Any medications entered into computer today will not appear on this note's list. The medications listed below were entered prior to today. Current Outpatient Prescriptions on File Prior to Visit  Medication Sig Dispense Refill  . calcium carbonate-magnesium hydroxide (ROLAIDS) 334 MG CHEW Chew 2 tablets by mouth daily as needed (For heartburn).      . IRON PO Take 1 tablet by mouth daily.       No current facility-administered medications on  file prior to visit.    Allergies: No Known Allergies  History   Social History  . Marital Status: Single    Spouse Name: N/A    Number of Children: N/A  . Years of Education: N/A   Occupational History  . Not on file.   Social History Main Topics  . Smoking status: Never Smoker   . Smokeless tobacco: Never Used  . Alcohol Use: No  . Drug Use: No  . Sexual Activity: Yes   Other Topics Concern  . Not on file   Social History Narrative  . No narrative on file    Family History  Problem Relation Age of Onset  . Anemia Mother   . Anemia Sister   . Birth defects Brother     heart disorder?  Marland Kitchen Hypertension Maternal Grandmother   . Diabetes Maternal Grandmother   . Hypertension Maternal  Grandfather   . Diabetes Maternal Grandfather   . Hypertension Paternal Grandmother   . Hypertension Paternal Grandfather      Review of Systems:  See HPI for pertinent ROS. All other ROS negative.    Physical Exam: Blood pressure 152/94, pulse 84, temperature 98.2 F (36.8 C), temperature source Oral, resp. rate 18, height 5' 1.5" (1.562 m), weight 279 lb (126.554 kg), last menstrual period 02/04/2013, not currently breastfeeding., Body mass index is 51.87 kg/(m^2). General: Obese WF. Appears in no acute distress. Neck: Supple. No thyromegaly. No lymphadenopathy. Lungs: Clear bilaterally to auscultation without wheezes, rales, or rhonchi. Breathing is unlabored. Heart: RRR with S1 S2. No murmurs, rubs, or gallops. Musculoskeletal:  Strength and tone normal for age. Extremities/Skin: Warm and dry. No clubbing or cyanosis. No edema. No rashes or suspicious lesions. Neuro: Alert and oriented X 3. Moves all extremities spontaneously. Gait is normal. CNII-XII grossly in tact. Psych:  Responds to questions appropriately with a normal affect.     ASSESSMENT AND PLAN:  23 y.o. year old female with  1. Generalized anxiety disorder I spent > 40 minutes with the patient in face-to-face contact discussing the above. Also had lengthy discussion regarding expectations of medications. Discussed that it can take weeks to see the effects from the Zoloft.  If she thinks it is causing side effects and she is to call me immediately. Otherwise even if she is seeing no effect from it she is to continue taking it daily until followup office visit in 6 weeks. Also discussed proper use of the Klonopin. Discussed that she should not use this prior to driving or work as it may cause her to feel drowsy. Also discussed that the goal would be in the future we can control most of her symptoms with the Zoloft and eventually get her off of the Klonopin. She'll schedule followup office visit in 6 weeks. Call sooner if any  adverse effects or symptoms are worsening. - sertraline (ZOLOFT) 50 MG tablet; Take 1 tablet (50 mg total) by mouth daily.  Dispense: 30 tablet; Refill: 3 - clonazePAM (KLONOPIN) 0.5 MG tablet; Take 1 tablet (0.5 mg total) by mouth 2 (two) times daily as needed for anxiety.  Dispense: 60 tablet; Refill: 1  2. Weight gain - TSH - BASIC METABOLIC PANEL WITH GFR   Signed, [address] [place], [place], [facility] 02/05/2013 9:13 AM

## 2013-03-18 ENCOUNTER — Encounter: Payer: Self-pay | Admitting: Physician Assistant

## 2013-03-18 ENCOUNTER — Ambulatory Visit (INDEPENDENT_AMBULATORY_CARE_PROVIDER_SITE_OTHER): Payer: 59 | Admitting: Physician Assistant

## 2013-03-18 VITALS — BP 144/80 | HR 84 | Temp 98.4°F | Resp 18 | Wt 272.0 lb

## 2013-03-18 DIAGNOSIS — R635 Abnormal weight gain: Secondary | ICD-10-CM

## 2013-03-18 DIAGNOSIS — F411 Generalized anxiety disorder: Secondary | ICD-10-CM

## 2013-03-18 MED ORDER — SERTRALINE HCL 100 MG PO TABS: 100.0000 mg | ORAL_TABLET | Freq: Every day | ORAL | Status: DC

## 2013-03-18 NOTE — Progress Notes (Signed)
Patient ID: Marilyn Cooper MRN: [id number], DOB: 03/23/1990, 23 y.o. Date of Encounter: 03/18/2013, 9:21 AM    Chief Complaint:  Chief Complaint  Patient presents with  . 6 wk follow up new meds    c/o rash ?     HPI: 23 y.o. year old white female here for f/u  evaluation of anxiety/depression as well as weight management.  I saw her for office visit on 02/04/13. At that visit she reported that she had struggled with depression for a long time. In the past had met with a therapist but had never been on any medications. In the past she was able to change her routine with exercise, food she was eating, and being involved in positive activities and avoiding things that made her sad/negative. Able to control her symptoms with these lifestyle changes.  However she reported that for the past 1 month she had not been able to control her mood even with trying to adjust these things. Stated that she would "go off" all of a sudden and lash out over nothing.  She has one child who was 25 months old at the time of that visit. she reported she had no postpartum depression. Says her mood has been stable until just over the past one month.  At that visit she reported that she believed one trigger recently had been the fact that she had not been able to get into school as planned. She stated that she had always been a planner and had always had goals. She had planned to go to college and then to pharmacy school. This baby was unplanned. However she had been able to adapt to that change. However when she recently found out that she did not get her forms into the school in time, this caused her a lot of anxiety. She stated that she felt she was questioning her identity and felt that her identity was somewhat lost -- now she is a mother and not in school and has had a lot of changes  At that visit she also reported she was having problems with insomnia. She stated that Friday night she did stay up late at  night doing laundry. Stated that she would get impulses to do things and would be  unable to control it.  Also at that visit she was concerned about her weight. At that visit her baby was 60 months old. She reported prepregnancy weight was 260 pounds. After the baby she actually lost down to 240 pounds. Stated that she had been eating small amounts of healthy foods throughout the day. Was walking 2.2 miles every day. At the time of her visit 02/04/13 she was doing no exercise. She was working at [employer] and because of her schedule she would end up eating an entire sub within a matter of minutes which was very different than the diet she had been doing. Also because of her anxiety she would end up eating a lot of chips etc. In addition to the weight gain she was also concerned about developing diabetes.  At that visit we did lab work including BMPT and TSH. These were both normal.  At that visit I started Zoloft 50 mg daily. Also prescribed Klonopin 0.5 mg to use 1 twice a day as needed.  Today she states that she does feel some improvement with the Zoloft. Some days she feels great and she does not need any Klonopin. However some days she does need to take Klonopin twice  per day. She says this dose of Klonopin works well for her.  She has noticed patches of itchy rash on her left elbow and also on her anterior chest recently. Says she has a history of eczema but this seemed a little different and she was assured that could be secondary to the medicines.  States that she has 5 months until her wedding. Also 5 months until she is moving into a new house.     Home Meds: See attached medication section for any medications that were entered at today's visit. The computer does not put those onto this list.The following list is a list of meds entered prior to today's visit.   Current Outpatient Prescriptions on File Prior to Visit  Medication Sig Dispense Refill  . calcium carbonate-magnesium hydroxide  (ROLAIDS) 334 MG CHEW Chew 2 tablets by mouth daily as needed (For heartburn).      . clonazePAM (KLONOPIN) 0.5 MG tablet Take 1 tablet (0.5 mg total) by mouth 2 (two) times daily as needed for anxiety.  60 tablet  1  . IRON PO Take 1 tablet by mouth daily.       No current facility-administered medications on file prior to visit.    Allergies: No Known Allergies    Review of Systems: See HPI for pertinent ROS. All other ROS negative.    Physical Exam: Blood pressure 144/80, pulse 84, temperature 98.4 F (36.9 C), temperature source Oral, resp. rate 18, weight 272 lb (123.378 kg), last menstrual period 03/05/2013, not currently breastfeeding., Body mass index is 50.57 kg/(m^2). General:  Obese WF. Appears in no acute distress. Neck: Supple. No thyromegaly. No lymphadenopathy. Lungs: Clear bilaterally to auscultation without wheezes, rales, or rhonchi. Breathing is unlabored. Heart: Regular rhythm. No murmurs, rubs, or gallops. Abdomen: Soft, non-tender, non-distended with normoactive bowel sounds. No hepatomegaly. No rebound/guarding. No obvious abdominal masses. Msk:  Strength and tone normal for age. Extremities/Skin: Warm and dry. No clubbing or cyanosis. No edema. No rashes or suspicious lesions. Neuro: Alert and oriented X 3. Moves all extremities spontaneously. Gait is normal. CNII-XII grossly in tact. Psych:  Responds to questions appropriately with a normal affect.     ASSESSMENT AND PLAN:  24 y.o. year old female with  1. Generalized anxiety disorder Increase Zoloft to 100 mg daily. Followup in 6 weeks or sooner if needed. Can continue to use the Klonopin one twice daily as needed. He still has lots of pills left in her original bottle. - sertraline (ZOLOFT) 100 MG tablet; Take 1 tablet (100 mg total) by mouth daily.  Dispense: 30 tablet; Refill: 3  2. Weight gain Weight is down from 279 on 02/04/13  To  272 today.  3. Rash: He is an approximate 1 inch area on the  brachial aspect of her right elbow which has very light pink erythema and very small papules/urticaria. She has similar type of rash on her anterior chest which is also about 1 inch. I discussed with her that I do not think this is related to her medications. I think may be related to her anxiety/nodes were secondary to her eczema. Recommend that she apply hydrocortisone cream to these areas. Followup if the rash worsens.  Regular office visits 6 weeks or sooner if there is worsening or develops any adverse reactions.  Signed, [address] [place], [place], [facility] 03/18/2013 9:21 AM

## 2013-05-01 ENCOUNTER — Ambulatory Visit: Payer: 59 | Admitting: Physician Assistant

## 2013-05-31 ENCOUNTER — Other Ambulatory Visit: Payer: Self-pay | Admitting: Physician Assistant

## 2013-05-31 NOTE — Telephone Encounter (Signed)
Dose change refill denied

## 2013-05-31 NOTE — Telephone Encounter (Signed)
Pt on 100 mg dose.  50 mg dose denied

## 2013-11-18 ENCOUNTER — Encounter: Payer: Self-pay | Admitting: Physician Assistant

## 2014-01-01 ENCOUNTER — Other Ambulatory Visit: Payer: Self-pay | Admitting: Physician Assistant

## 2014-01-01 NOTE — Telephone Encounter (Signed)
Pt has not had this refilled since spring.  At last vist was to return for follow up and was No Show.  Refilled returned to pharmacy denied

## 2014-03-11 ENCOUNTER — Ambulatory Visit (INDEPENDENT_AMBULATORY_CARE_PROVIDER_SITE_OTHER): Payer: 59 | Admitting: Family Medicine

## 2014-03-11 ENCOUNTER — Encounter: Payer: Self-pay | Admitting: Family Medicine

## 2014-03-11 VITALS — BP 138/86 | HR 120 | Temp 99.9°F | Resp 20 | Ht 62.0 in | Wt 281.0 lb

## 2014-03-11 DIAGNOSIS — F411 Generalized anxiety disorder: Secondary | ICD-10-CM

## 2014-03-11 DIAGNOSIS — J069 Acute upper respiratory infection, unspecified: Secondary | ICD-10-CM

## 2014-03-11 DIAGNOSIS — E860 Dehydration: Secondary | ICD-10-CM

## 2014-03-11 MED ORDER — AZITHROMYCIN 250 MG PO TABS: ORAL_TABLET | ORAL | Status: DC

## 2014-03-11 MED ORDER — GUAIFENESIN-CODEINE 100-10 MG/5ML PO SOLN: 5.0000 mL | Freq: Four times a day (QID) | ORAL | Status: DC | PRN

## 2014-03-11 MED ORDER — CLONAZEPAM 0.5 MG PO TABS: 0.5000 mg | ORAL_TABLET | Freq: Two times a day (BID) | ORAL | Status: DC | PRN

## 2014-03-11 MED ORDER — ALBUTEROL SULFATE HFA 108 (90 BASE) MCG/ACT IN AERS: 2.0000 | INHALATION_SPRAY | RESPIRATORY_TRACT | Status: DC | PRN

## 2014-03-11 NOTE — Patient Instructions (Signed)
Increase your fluids Take antibiotics, cough medicine, antibiotics Klonopin refilled F/U 6 months

## 2014-03-11 NOTE — Assessment & Plan Note (Addendum)
Continue zoloft, she ran out of klonopin 3 weeks ago, refilled,   Note she has had tachycardia on and off, she been taking OTC cough/sinus meds, fever, may be MTF at this point, no red flags Discussed returning for fasting labs if she does not have them done by her GYN at her Physical next month

## 2014-03-11 NOTE — Progress Notes (Signed)
Patient ID: Marilyn Cooper, female   DOB: 07/07/1990, 24 y.o.   MRN: [id number]   Subjective:    Patient ID: Marilyn Cooper, female    DOB: 1990/05/08, 24 y.o.   MRN: [id number]  Patient presents for Illness  patient here with respiratory illness. She actually had a GI bug last week along with the rest of her family members this resolved by Thursday. Through the weekend she started having low-grade fever MAXIMUM TEMPERATURE is 100.40F she also had cough with bile production wheezing soreness in her chest. At times she would still short of breath. She has history of asthma but she has not had any flares in the past few years. She does not have any inhalers at home. She admits that she has not been drinking a lot of fluids but she has been trying to rest. She is caring for HER-12-year-old at home who also had the gastroenteritis. She has not had any nausea vomiting the past few days. She is tolerating by mouth otherwise    Review Of Systems:  GEN- denies fatigue, +fever, weight loss,weakness, recent illness HEENT- denies eye drainage, change in vision, nasal discharge, CVS- denies chest pain, palpitations RESP- + SOB, +cough, +wheeze ABD- denies N/V, +change in stools, abd pain GU- denies dysuria, hematuria, dribbling, incontinence MSK- denies joint pain, muscle aches, injury Neuro- denies headache, dizziness, syncope, seizure activity       Objective:    BP 138/86 mmHg  Pulse 120  Temp(Src) 99.9 F (37.7 C) (Oral)  Resp 20  Ht 5' 2"  (1.575 m)  Wt 281 lb (127.461 kg)  BMI 51.38 kg/m2  SpO2 98%  LMP 02/22/2014 (Approximate) GEN- NAD, alert and oriented x3 HEENT- PERRL, EOMI, non injected sclera, pink conjunctiva, MM a little tachy, oropharynx clear, TM clear bilat, nares clear, no maxillary sinus tenderness Neck- Supple, no LAD CVS- tacychardic, no murmur RESP-course BS, decreased at base, difficult due to habitus, harsh cough, no wheeze, no rales ABD-NABS,soft,NT,ND EXT- No  edema Pulses- Radial 2+        Assessment & Plan:      Problem List Items Addressed This Visit      Unprioritized   Generalized anxiety disorder   Relevant Medications   clonazePAM (KLONOPIN)  tablet    Other Visit Diagnoses    Acute URI    -  Primary    Given Zpak, albuterol for bedtome and cough medicine     Relevant Medications    azithromycin (ZITHROMAX) tablet    Mild dehydration        Recent gastroenteritis, resolved now with respiratory infection,push fluids, gaterade, water, rest       Note: This dictation was prepared with Dragon dictation along with smaller Company secretary. Any transcriptional errors that result from this process are unintentional.

## 2014-05-28 LAB — OB RESULTS CONSOLE GC/CHLAMYDIA
Chlamydia: NEGATIVE
Gonorrhea: NEGATIVE

## 2014-06-21 ENCOUNTER — Inpatient Hospital Stay (HOSPITAL_COMMUNITY): Payer: Medicaid Other | Admitting: Registered Nurse

## 2014-06-21 ENCOUNTER — Inpatient Hospital Stay (HOSPITAL_COMMUNITY): Payer: Medicaid Other

## 2014-06-21 ENCOUNTER — Encounter (HOSPITAL_COMMUNITY): Admission: AD | Disposition: A | Discharge status: Home / Self Care | Payer: Self-pay | Source: Ambulatory Visit

## 2014-06-21 ENCOUNTER — Encounter (HOSPITAL_COMMUNITY): Payer: Self-pay | Admitting: *Deleted

## 2014-06-21 ENCOUNTER — Inpatient Hospital Stay (HOSPITAL_COMMUNITY)
Admission: AD | Admit: 2014-06-21 | Discharge: 2014-06-25 | DRG: 330 | Disposition: A | Discharge status: Home / Self Care | Payer: Medicaid Other | Source: Ambulatory Visit | Attending: Surgery | Admitting: Surgery

## 2014-06-21 DIAGNOSIS — O368390 Maternal care for abnormalities of the fetal heart rate or rhythm, unspecified trimester, not applicable or unspecified: Secondary | ICD-10-CM

## 2014-06-21 DIAGNOSIS — Z3A13 13 weeks gestation of pregnancy: Secondary | ICD-10-CM | POA: Diagnosis present

## 2014-06-21 DIAGNOSIS — K219 Gastro-esophageal reflux disease without esophagitis: Secondary | ICD-10-CM | POA: Diagnosis present

## 2014-06-21 DIAGNOSIS — K420 Umbilical hernia with obstruction, without gangrene: Principal | ICD-10-CM | POA: Diagnosis present

## 2014-06-21 DIAGNOSIS — D649 Anemia, unspecified: Secondary | ICD-10-CM | POA: Diagnosis present

## 2014-06-21 DIAGNOSIS — J45909 Unspecified asthma, uncomplicated: Secondary | ICD-10-CM | POA: Diagnosis present

## 2014-06-21 DIAGNOSIS — Z8249 Family history of ischemic heart disease and other diseases of the circulatory system: Secondary | ICD-10-CM

## 2014-06-21 DIAGNOSIS — O26899 Other specified pregnancy related conditions, unspecified trimester: Secondary | ICD-10-CM

## 2014-06-21 DIAGNOSIS — K436 Other and unspecified ventral hernia with obstruction, without gangrene: Secondary | ICD-10-CM | POA: Diagnosis present

## 2014-06-21 DIAGNOSIS — R112 Nausea with vomiting, unspecified: Secondary | ICD-10-CM

## 2014-06-21 DIAGNOSIS — Z833 Family history of diabetes mellitus: Secondary | ICD-10-CM

## 2014-06-21 DIAGNOSIS — R109 Unspecified abdominal pain: Secondary | ICD-10-CM

## 2014-06-21 DIAGNOSIS — Z6841 Body Mass Index (BMI) 40.0 and over, adult: Secondary | ICD-10-CM | POA: Diagnosis not present

## 2014-06-21 DIAGNOSIS — O139 Gestational [pregnancy-induced] hypertension without significant proteinuria, unspecified trimester: Secondary | ICD-10-CM | POA: Diagnosis present

## 2014-06-21 DIAGNOSIS — K429 Umbilical hernia without obstruction or gangrene: Secondary | ICD-10-CM

## 2014-06-21 HISTORY — PX: UMBILICAL HERNIA REPAIR: SHX196

## 2014-06-21 LAB — CBC
HEMATOCRIT: 33.5 % — AB (ref 36.0–46.0)
HEMOGLOBIN: 11.1 g/dL — AB (ref 12.0–15.0)
MCH: 25.1 pg — AB (ref 26.0–34.0)
MCHC: 33.1 g/dL (ref 30.0–36.0)
MCV: 75.8 fL — ABNORMAL LOW (ref 78.0–100.0)
Platelets: 193 10*3/uL (ref 150–400)
RBC: 4.42 MIL/uL (ref 3.87–5.11)
RDW: 15.4 % (ref 11.5–15.5)
WBC: 10.6 10*3/uL — ABNORMAL HIGH (ref 4.0–10.5)

## 2014-06-21 LAB — COMPREHENSIVE METABOLIC PANEL
ALK PHOS: 69 U/L (ref 38–126)
ALT: 12 U/L — AB (ref 14–54)
ANION GAP: 7 (ref 5–15)
AST: 15 U/L (ref 15–41)
Albumin: 3.5 g/dL (ref 3.5–5.0)
BUN: 6 mg/dL (ref 6–20)
CALCIUM: 8.8 mg/dL — AB (ref 8.9–10.3)
CHLORIDE: 108 mmol/L (ref 101–111)
CO2: 22 mmol/L (ref 22–32)
Creatinine, Ser: 0.48 mg/dL (ref 0.44–1.00)
GFR calc Af Amer: >60 mL/min (ref >60)
GFR calc non Af Amer: >60 mL/min (ref >60)
GLUCOSE: 142 mg/dL — AB (ref 65–99)
Potassium: 3.9 mmol/L (ref 3.5–5.1)
SODIUM: 137 mmol/L (ref 135–145)
TOTAL PROTEIN: 7.2 g/dL (ref 6.5–8.1)
Total Bilirubin: 0.4 mg/dL (ref 0.3–1.2)

## 2014-06-21 SURGERY — REPAIR, HERNIA, UMBILICAL, ADULT
Anesthesia: General | Site: Abdomen

## 2014-06-21 MED ORDER — FENTANYL CITRATE (PF) 250 MCG/5ML IJ SOLN
INTRAMUSCULAR | Status: AC
  Filled 2014-06-21: qty 5

## 2014-06-21 MED ORDER — HYDROMORPHONE HCL 1 MG/ML IJ SOLN
2.0000 mg | Freq: Once | INTRAMUSCULAR | Status: AC
  Administered 2014-06-21: 2 mg via INTRAVENOUS
  Filled 2014-06-21: qty 2

## 2014-06-21 MED ORDER — 0.9 % SODIUM CHLORIDE (POUR BTL) OPTIME
TOPICAL | Status: DC | PRN
  Administered 2014-06-21: 1000 mL

## 2014-06-21 MED ORDER — ONDANSETRON HCL 4 MG PO TABS: 4.0000 mg | ORAL_TABLET | Freq: Four times a day (QID) | ORAL | Status: DC | PRN

## 2014-06-21 MED ORDER — ENOXAPARIN SODIUM 40 MG/0.4ML ~~LOC~~ SOLN
40.0000 mg | SUBCUTANEOUS | Status: DC
  Administered 2014-06-22 – 2014-06-24 (×3): 40 mg via SUBCUTANEOUS
  Filled 2014-06-21 (×4): qty 0.4

## 2014-06-21 MED ORDER — SUCCINYLCHOLINE CHLORIDE 20 MG/ML IJ SOLN
INTRAMUSCULAR | Status: DC | PRN
  Administered 2014-06-21: 100 mg via INTRAVENOUS

## 2014-06-21 MED ORDER — HYDROMORPHONE HCL 1 MG/ML IJ SOLN
0.2500 mg | INTRAMUSCULAR | Status: DC | PRN
  Administered 2014-06-21: 0.25 mg via INTRAVENOUS
  Administered 2014-06-21: 0.5 mg via INTRAVENOUS
  Administered 2014-06-21: 0.25 mg via INTRAVENOUS

## 2014-06-21 MED ORDER — GLYCOPYRROLATE 0.2 MG/ML IJ SOLN
INTRAMUSCULAR | Status: DC | PRN
  Administered 2014-06-21: .8 mg via INTRAVENOUS

## 2014-06-21 MED ORDER — DEXAMETHASONE SODIUM PHOSPHATE 10 MG/ML IJ SOLN
INTRAMUSCULAR | Status: AC
  Filled 2014-06-21: qty 1

## 2014-06-21 MED ORDER — MIDAZOLAM HCL 2 MG/2ML IJ SOLN
INTRAMUSCULAR | Status: AC
Start: 2014-06-21 — End: 2014-06-21
  Filled 2014-06-21: qty 2

## 2014-06-21 MED ORDER — HYDROMORPHONE HCL 1 MG/ML IJ SOLN
INTRAMUSCULAR | Status: AC
  Filled 2014-06-21: qty 1

## 2014-06-21 MED ORDER — KETOROLAC TROMETHAMINE 30 MG/ML IJ SOLN: 30.0000 mg | Freq: Once | INTRAMUSCULAR | Status: DC | PRN

## 2014-06-21 MED ORDER — BUPIVACAINE HCL (PF) 0.25 % IJ SOLN
INTRAMUSCULAR | Status: AC
  Filled 2014-06-21: qty 30

## 2014-06-21 MED ORDER — PROMETHAZINE HCL 25 MG/ML IJ SOLN: 6.2500 mg | INTRAMUSCULAR | Status: DC | PRN

## 2014-06-21 MED ORDER — NEOSTIGMINE METHYLSULFATE 10 MG/10ML IV SOLN
INTRAVENOUS | Status: AC
  Filled 2014-06-21: qty 1

## 2014-06-21 MED ORDER — NEOSTIGMINE METHYLSULFATE 10 MG/10ML IV SOLN
INTRAVENOUS | Status: DC | PRN
  Administered 2014-06-21: 5 mg via INTRAVENOUS

## 2014-06-21 MED ORDER — ONDANSETRON HCL 4 MG/2ML IJ SOLN
INTRAMUSCULAR | Status: DC | PRN
  Administered 2014-06-21: 4 mg via INTRAVENOUS

## 2014-06-21 MED ORDER — GLYCOPYRROLATE 0.2 MG/ML IJ SOLN
INTRAMUSCULAR | Status: AC
  Filled 2014-06-21: qty 3

## 2014-06-21 MED ORDER — MEPERIDINE HCL 50 MG/ML IJ SOLN: 6.2500 mg | INTRAMUSCULAR | Status: DC | PRN

## 2014-06-21 MED ORDER — ERTAPENEM SODIUM 1 G IJ SOLR: 1.0000 g | INTRAMUSCULAR | Status: DC | PRN

## 2014-06-21 MED ORDER — SODIUM CHLORIDE 0.9 % IV SOLN
1.0000 g | INTRAVENOUS | Status: DC | PRN
  Administered 2014-06-21: 1 g via INTRAVENOUS

## 2014-06-21 MED ORDER — ONDANSETRON HCL 4 MG/2ML IJ SOLN
INTRAMUSCULAR | Status: AC
  Filled 2014-06-21: qty 2

## 2014-06-21 MED ORDER — KCL IN DEXTROSE-NACL 20-5-0.45 MEQ/L-%-% IV SOLN
INTRAVENOUS | Status: DC
  Administered 2014-06-21 – 2014-06-25 (×4): via INTRAVENOUS
  Filled 2014-06-21 (×11): qty 1000

## 2014-06-21 MED ORDER — HYDROMORPHONE HCL 1 MG/ML IJ SOLN
1.0000 mg | INTRAMUSCULAR | Status: DC | PRN
Start: 2014-06-21 — End: 2014-06-25
  Administered 2014-06-21: 1 mg via INTRAVENOUS
  Administered 2014-06-22 (×6): 2 mg via INTRAVENOUS
  Administered 2014-06-23: 1 mg via INTRAVENOUS
  Administered 2014-06-23 (×2): 2 mg via INTRAVENOUS
  Administered 2014-06-23 – 2014-06-24 (×5): 1 mg via INTRAVENOUS
  Filled 2014-06-21 (×5): qty 1
  Filled 2014-06-21 (×5): qty 2
  Filled 2014-06-21: qty 1
  Filled 2014-06-21 (×3): qty 2
  Filled 2014-06-21: qty 1

## 2014-06-21 MED ORDER — ROCURONIUM BROMIDE 100 MG/10ML IV SOLN
INTRAVENOUS | Status: AC
  Filled 2014-06-21: qty 1

## 2014-06-21 MED ORDER — LIP MEDEX EX OINT
TOPICAL_OINTMENT | CUTANEOUS | Status: AC
  Filled 2014-06-21: qty 7

## 2014-06-21 MED ORDER — ALBUTEROL SULFATE (2.5 MG/3ML) 0.083% IN NEBU: 3.0000 mL | INHALATION_SOLUTION | RESPIRATORY_TRACT | Status: DC | PRN

## 2014-06-21 MED ORDER — DEXAMETHASONE SODIUM PHOSPHATE 10 MG/ML IJ SOLN
INTRAMUSCULAR | Status: DC | PRN
  Administered 2014-06-21: 10 mg via INTRAVENOUS

## 2014-06-21 MED ORDER — ONDANSETRON HCL 4 MG/2ML IJ SOLN
4.0000 mg | Freq: Four times a day (QID) | INTRAMUSCULAR | Status: DC | PRN
  Administered 2014-06-24 (×2): 4 mg via INTRAVENOUS
  Filled 2014-06-21 (×2): qty 2

## 2014-06-21 MED ORDER — SODIUM CHLORIDE 0.9 % IV SOLN
1.0000 g | INTRAVENOUS | Status: DC
  Filled 2014-06-21: qty 1

## 2014-06-21 MED ORDER — LACTATED RINGERS IV SOLN
Freq: Once | INTRAVENOUS | Status: AC
  Administered 2014-06-21: 13:00:00 via INTRAVENOUS

## 2014-06-21 MED ORDER — HYDROMORPHONE HCL 1 MG/ML IJ SOLN
1.0000 mg | Freq: Once | INTRAMUSCULAR | Status: AC
  Administered 2014-06-21: 1 mg via INTRAVENOUS
  Filled 2014-06-21: qty 1

## 2014-06-21 MED ORDER — PROPOFOL 10 MG/ML IV BOLUS
INTRAVENOUS | Status: DC | PRN
  Administered 2014-06-21: 200 mg via INTRAVENOUS

## 2014-06-21 MED ORDER — LACTATED RINGERS IV SOLN
INTRAVENOUS | Status: DC | PRN
  Administered 2014-06-21: 18:00:00 via INTRAVENOUS

## 2014-06-21 MED ORDER — FENTANYL CITRATE (PF) 100 MCG/2ML IJ SOLN
INTRAMUSCULAR | Status: DC | PRN
  Administered 2014-06-21 (×10): 50 ug via INTRAVENOUS

## 2014-06-21 MED ORDER — LIDOCAINE HCL (CARDIAC) 20 MG/ML IV SOLN
INTRAVENOUS | Status: AC
  Filled 2014-06-21: qty 5

## 2014-06-21 MED ORDER — PROPOFOL 10 MG/ML IV BOLUS
INTRAVENOUS | Status: AC
  Filled 2014-06-21: qty 20

## 2014-06-21 MED ORDER — ROCURONIUM BROMIDE 100 MG/10ML IV SOLN
INTRAVENOUS | Status: DC | PRN
  Administered 2014-06-21 (×2): 10 mg via INTRAVENOUS
  Administered 2014-06-21: 30 mg via INTRAVENOUS

## 2014-06-21 MED ORDER — SODIUM CHLORIDE 0.9 % IV SOLN
INTRAVENOUS | Status: AC
  Filled 2014-06-21: qty 1

## 2014-06-21 SURGICAL SUPPLY — 40 items
APL SKNCLS STERI-STRIP NONHPOA (GAUZE/BANDAGES/DRESSINGS) ×1
BENZOIN TINCTURE PRP APPL 2/3 (GAUZE/BANDAGES/DRESSINGS) ×2 IMPLANT
BLADE HEX COATED 2.75 (ELECTRODE) ×2 IMPLANT
BLADE SURG SZ10 CARB STEEL (BLADE) ×4 IMPLANT
CHLORAPREP W/TINT 26ML (MISCELLANEOUS) ×2 IMPLANT
CLEANER TIP ELECTROSURG 2X2 (MISCELLANEOUS) ×2 IMPLANT
COVER MAYO STAND STRL (DRAPES) ×1 IMPLANT
DECANTER SPIKE VIAL GLASS SM (MISCELLANEOUS) ×2 IMPLANT
DRAPE LAPAROTOMY T 102X78X121 (DRAPES) ×2 IMPLANT
DRSG OPSITE POSTOP 4X6 (GAUZE/BANDAGES/DRESSINGS) ×1 IMPLANT
ELECT REM PT RETURN 9FT ADLT (ELECTROSURGICAL) ×2
ELECTRODE REM PT RTRN 9FT ADLT (ELECTROSURGICAL) ×1 IMPLANT
GAUZE SPONGE 4X4 12PLY STRL (GAUZE/BANDAGES/DRESSINGS) IMPLANT
GLOVE BIOGEL PI IND STRL 7.0 (GLOVE) ×1 IMPLANT
GLOVE BIOGEL PI INDICATOR 7.0 (GLOVE) ×1
GLOVE SURG ORTHO 8.0 STRL STRW (GLOVE) ×2 IMPLANT
GOWN STRL REUS W/TWL LRG LVL3 (GOWN DISPOSABLE) ×2 IMPLANT
GOWN STRL REUS W/TWL XL LVL3 (GOWN DISPOSABLE) ×4 IMPLANT
KIT BASIN OR (CUSTOM PROCEDURE TRAY) ×2 IMPLANT
LIGASURE IMPACT 36 18CM CVD LR (INSTRUMENTS) ×1 IMPLANT
MARKER SKIN DUAL TIP RULER LAB (MISCELLANEOUS) ×2 IMPLANT
NDL HYPO 25X1 1.5 SAFETY (NEEDLE) ×1 IMPLANT
NEEDLE HYPO 25X1 1.5 SAFETY (NEEDLE) ×2 IMPLANT
NS IRRIG 1000ML POUR BTL (IV SOLUTION) ×2 IMPLANT
PACK BASIC VI WITH GOWN DISP (CUSTOM PROCEDURE TRAY) ×2 IMPLANT
PENCIL BUTTON HOLSTER BLD 10FT (ELECTRODE) ×2 IMPLANT
RELOAD PROXIMATE 75MM BLUE (ENDOMECHANICALS) ×4 IMPLANT
RELOAD STAPLE 75 3.8 BLU REG (ENDOMECHANICALS) IMPLANT
SPONGE LAP 4X18 X RAY DECT (DISPOSABLE) ×2 IMPLANT
STAPLER PROXIMATE 75MM BLUE (STAPLE) ×1 IMPLANT
STRIP CLOSURE SKIN 1/2X4 (GAUZE/BANDAGES/DRESSINGS) ×2 IMPLANT
SUT ETHIBOND NAB CT1 #1 30IN (SUTURE) ×2 IMPLANT
SUT MNCRL AB 4-0 PS2 18 (SUTURE) ×2 IMPLANT
SUT NOVA NAB DX-16 0-1 5-0 T12 (SUTURE) ×1 IMPLANT
SUT SILK 2 0 SH CR/8 (SUTURE) ×1 IMPLANT
SUT SILK 3 0 SH CR/8 (SUTURE) ×1 IMPLANT
SUT VIC AB 3-0 SH 18 (SUTURE) ×3 IMPLANT
SYR CONTROL 10ML LL (SYRINGE) ×2 IMPLANT
TOWEL OR 17X26 10 PK STRL BLUE (TOWEL DISPOSABLE) ×2 IMPLANT
WATER STERILE IRR 1500ML POUR (IV SOLUTION) ×2 IMPLANT

## 2014-06-21 NOTE — ED Provider Notes (Signed)
CSN: [id number]     Arrival date & time 06/21/14  1229 History   First MD Initiated Contact with Patient 06/21/14 1600     Chief Complaint  Patient presents with  . Hernia     (Consider location/radiation/quality/duration/timing/severity/associated sxs/prior Treatment) HPI Comments: Patient presents as a transfer from the [facility]U. She is [redacted] weeks pregnant. She has a known umbilical hernia. Today she had increasing pain with enlargement of the hernia and associated nausea and vomiting. She was seen that the [facility] and the hernia was not able to be reduced. They consulted with Dr. Gerrit FriendsGerkin who advised [facility]. She denies any fevers. She denies any current issues with the pregnancy.   Past Medical History  Diagnosis Date  . Pregnancy induced hypertension   . Anemia   . Obese   . Hx of varicella   . Asthma     childhood   Past Surgical History  Procedure Laterality Date  . Tonsillectomy    . Cesarean section N/A 03/09/2012    Procedure: CESAREAN SECTION;  Surgeon: Freddrick MarchKendra H. Tenny Crawoss, MD;  Location: [facility];  Service: Obstetrics;  Laterality: N/A;   Family History  Problem Relation Age of Onset  . Anemia Mother   . Anemia Sister   . Birth defects Brother     heart disorder?  Marland Kitchen. Hypertension Maternal Grandmother   . Diabetes Maternal Grandmother   . Hypertension Maternal Grandfather   . Diabetes Maternal Grandfather   . Hypertension Paternal Grandmother   . Hypertension Paternal Grandfather    History  Substance Use Topics  . Smoking status: Never Smoker   . Smokeless tobacco: Never Used  . Alcohol Use: No   OB History    Gravida Para Term Preterm AB TAB SAB Ectopic Multiple Living   5 1 1  3 3    1      Review of Systems  Constitutional: Negative for fever, chills, diaphoresis and fatigue.  HENT: Negative for congestion, rhinorrhea and sneezing.   Eyes: Negative.   Respiratory: Negative for cough, chest tightness  and shortness of breath.   Cardiovascular: Negative for chest pain and leg swelling.  Gastrointestinal: Positive for nausea, vomiting and abdominal pain. Negative for diarrhea and blood in stool.  Genitourinary: Negative for frequency, hematuria, flank pain and difficulty urinating.  Musculoskeletal: Negative for back pain and arthralgias.  Skin: Negative for rash.  Neurological: Negative for dizziness, speech difficulty, weakness, numbness and headaches.      Allergies  Review of patient's allergies indicates no known allergies.  Home Medications   Prior to Admission medications   Medication Sig Start Date End Date Taking? Authorizing Provider  acetaminophen (TYLENOL) 325 MG tablet Take 650 mg by mouth every 6 (six) hours as needed for mild pain or headache.   Yes Historical Provider, MD  Famotidine (PEPCID AC PO) Take 1 tablet by mouth daily.   Yes Historical Provider, MD  IRON PO Take 1 tablet by mouth daily.   Yes Historical Provider, MD  Prenatal Vit-Fe Fumarate-FA (PRENATAL MULTIVITAMIN) TABS tablet Take 2 tablets by mouth 2 (two) times daily.   Yes Historical Provider, MD  albuterol (PROVENTIL HFA;VENTOLIN HFA) 108 (90 BASE) MCG/ACT inhaler Inhale 2 puffs into the lungs every 4 (four) hours as needed for wheezing or shortness of breath. Patient not taking: Reported on 06/21/2014 03/11/14   Salley ScarletKawanta F [place], MD  azithromycin (ZITHROMAX) 250 MG tablet Take 2 tablets x 1 day,  then 1 tab daily for 4 days Patient not taking: Reported on 06/21/2014 03/11/14   Salley Scarlet, MD  clonazePAM (KLONOPIN) 0.5 MG tablet Take 1 tablet (0.5 mg total) by mouth 2 (two) times daily as needed for anxiety. Patient not taking: Reported on 06/21/2014 03/11/14   Salley Scarlet, MD  guaiFENesin-codeine 100-10 MG/5ML syrup Take 5 mLs by mouth every 6 (six) hours as needed for cough. Patient not taking: Reported on 06/21/2014 03/11/14   Salley Scarlet, MD  sertraline (ZOLOFT) 100 MG tablet Take 1 tablet (100  mg total) by mouth daily. Patient not taking: Reported on 06/21/2014 03/18/13   Dorena Bodo, PA-C   BP 154/93 mmHg  Pulse 90  Temp(Src) 98 F (36.7 C) (Oral)  Resp 20  Ht  (1.549 m)  Wt 284 lb (128.822 kg)  BMI 53.69 kg/m2  SpO2 100%  LMP 02/22/2014 (Approximate) Physical Exam  Constitutional: She is oriented to person, place, and time. She appears well-developed and well-nourished.  HENT:  Head: Normocephalic and atraumatic.  Eyes: Pupils are equal, round, and reactive to light.  Neck: Normal range of motion. Neck supple.  Cardiovascular: Normal rate, regular rhythm and normal heart sounds.   Pulmonary/Chest: Effort normal and breath sounds normal. No respiratory distress. She has no wheezes. She has no rales. She exhibits no tenderness.  Abdominal: Soft. Bowel sounds are normal. There is tenderness. There is no rebound and no guarding.  +tender umbilical hernia.  Musculoskeletal: Normal range of motion. She exhibits no edema.  Lymphadenopathy:    She has no cervical adenopathy.  Neurological: She is alert and oriented to person, place, and time.  Skin: Skin is warm and dry. No rash noted.  Psychiatric: She has a normal mood and affect.    ED Course  Procedures (including critical care time) Labs Review Labs Reviewed  CBC - Abnormal; Notable for the following:    WBC 10.6 (*)    Hemoglobin 11.1 (*)    HCT 33.5 (*)    MCV 75.8 (*)    MCH 25.1 (*)    All other components within normal limits  COMPREHENSIVE METABOLIC PANEL - Abnormal; Notable for the following:    Glucose, Bld 142 (*)    Calcium 8.8 (*)    ALT 12 (*)    All other components within normal limits    Imaging Review No results found.   EKG Interpretation None      MDM   Final diagnoses:  Umbilical hernia, recurrence not specified    Pt seen by Dr. Gerrit Friends at the same time as my evaluation.  Admitted to the surgery team for surgical fixation of the hernia.    Rolan Bucco, MD 06/21/14  [phone]

## 2014-06-21 NOTE — ED Notes (Signed)
Bed: XB14WA19 Expected date: 06/21/14 Expected time: 3:49 PM Means of arrival: Ambulance Comments: [facility]

## 2014-06-21 NOTE — Anesthesia Preprocedure Evaluation (Addendum)
Anesthesia Evaluation  Patient identified by MRN, date of birth, ID band Patient awake    Reviewed: Allergy & Precautions, H&P , NPO status , Patient's Chart, lab work & pertinent test results, reviewed documented beta blocker date and time   Airway Mallampati: II  TM Distance: >3 FB Neck ROM: full    Dental no notable dental hx. (+) Teeth Intact   Pulmonary    Pulmonary exam normal       Cardiovascular hypertension, Normal cardiovascular exam    Neuro/Psych negative neurological ROS     GI/Hepatic negative GI ROS, Neg liver ROS,   Endo/Other  Morbid obesity  Renal/GU negative Renal ROS     Musculoskeletal   Abdominal Normal abdominal exam  (+)   Peds  Hematology   Anesthesia Other Findings   Reproductive/Obstetrics negative OB ROS                            Anesthesia Physical Anesthesia Plan  ASA: III  Anesthesia Plan: General   Post-op Pain Management:    Induction: Intravenous  Airway Management Planned: Oral ETT  Additional Equipment:   Intra-op Plan:   Post-operative Plan: Extubation in OR  Informed Consent: I have reviewed the patients History and Physical, chart, labs and discussed the procedure including the risks, benefits and alternatives for the proposed anesthesia with the patient or authorized representative who has indicated his/her understanding and acceptance.   Dental Advisory Given  Plan Discussed with: CRNA and Surgeon  Anesthesia Plan Comments:         Anesthesia Quick Evaluation

## 2014-06-21 NOTE — Progress Notes (Signed)
Report called to Salome HolmesAnne Councilman RN on 5E and transported to by Wells GuilesSarah Marshall RN. Pink and black shorts and wedding rings with pt at transfer.

## 2014-06-21 NOTE — ED Notes (Signed)
Pt BIB [facility]. [facility] Pt is here for surgical consult regarding umbilical hernia. Pt is [redacted] weeks pregnant. NPO since last night.

## 2014-06-21 NOTE — Anesthesia Postprocedure Evaluation (Signed)
Anesthesia Post Note  Patient: Marilyn Cooper  Procedure(s) Performed: Procedure(s) (LRB): EXPLORATORY LAPAROTOMY,  SMALL BOWEL RESECTION, PRIMARY REPAIR INCARCERATED VENTRAL HERNIA (N/A)  Anesthesia type: General  Patient location: PACU  Post pain: Pain level controlled  Post assessment: Post-op Vital signs reviewed  Last Vitals:  Filed Vitals:   06/21/14 2100  BP: 162/88  Pulse: 94  Temp:   Resp: 16    Post vital signs: Reviewed  Level of consciousness: sedated  Complications: No apparent anesthesia complications

## 2014-06-21 NOTE — Transfer of Care (Signed)
Immediate Anesthesia Transfer of Care Note  Patient: Marilyn Cooper  Procedure(s) Performed: Procedure(s): EXPLORATORY LAPAROTOMY,  SMALL BOWEL RESECTION, PRIMARY REPAIR INCARCERATED VENTRAL HERNIA (N/A)  Patient Location: PACU  Anesthesia Type:General  Level of Consciousness:  sedated, patient cooperative and responds to stimulation  Airway & Oxygen Therapy:Patient Spontanous Breathing and Patient connected to face mask oxgen  Post-op Assessment:  Report given to PACU RN and Post -op Vital signs reviewed and stable  Post vital signs:  Reviewed and stable  Last Vitals:  Filed Vitals:   06/21/14 1652  BP: 111/66  Pulse: 77  Temp: 36.7 C  Resp: 16    Complications: No apparent anesthesia complications

## 2014-06-21 NOTE — Op Note (Signed)
NAMEDARLISA, SPRUIELL NO.:  [id number]  MEDICAL RECORD NO.:  [id number]  LOCATION:  [facility]                         FACILITY:  [facility]  PHYSICIAN:  Velora Heckler, MD      DATE OF BIRTH:  May 13, 1990  DATE OF PROCEDURE:  06/21/2014                              OPERATIVE REPORT   PREOPERATIVE DIAGNOSIS:  Incarcerated ventral hernia.  POSTOPERATIVE DIAGNOSIS:  Strangulated ventral hernia with infarcted loop of small bowel.  PROCEDURE: 1. Exploratory laparotomy. 2. Small bowel resection with primary anastomosis. 3. Primary repair of ventral hernia.  SURGEON:  Velora Heckler, MD, FACS  ANESTHESIA:  General.  ESTIMATED BLOOD LOSS:  Minimal.  PREPARATION:  ChloraPrep.  COMPLICATIONS:  None.  INDICATIONS:  The patient is a 24 year old female, referred from [facility], at 12 weeks estimated gestational age with her second pregnancy.  She presented with an incarcerated umbilical hernia.  Hernia has been present for approximately 2 years, but has never been incarcerated.  Over the past 24 hours, she developed pain, swelling, nausea, and vomiting.  No imaging was performed due to concerns for the fetus.  The patient was transferred to [facility], where she was evaluated by General Surgery and prepared for the operating room.  BODY OF REPORT:  Procedure was done in OR #1 at the [facility].  The patient was brought to the operating room, placed in supine position on the operating room table.  Following administration of general anesthesia, the patient was prepped and draped in the usual aseptic fashion.  After ascertaining that an adequate level of anesthesia had been achieved, a midline incision was made just above the level of the umbilicus.  Dissection was carefully carried into the subcutaneous tissues and the hernia sac was identified.  It contains omentum.  It contains a moderate amount of fluid.  Hernia sac was opened and  explored.  The omentum appears viable.  However, there was a loop of small bowel present which was clearly infarcted.  It was not perforated. Fascial incision was extended cephalad in the midline.  This allowed for delivery of the small bowel onto the abdominal wall.  There appears to be approximately a 20-cm segment of infarcted small bowel.  The omentum was dissected away from the hernia defect and reduced back within the peritoneal cavity.  Part of the omentum was resected and ligated with 2- 0 silk ties.  Next, the bowel was resected using GIA staplers, proximal and distal to the infarcted segment.  Mesentery was divided with the LigaSure.  A side-to-side functionally end-to-end anastomosis was created between the proximal and distal small bowel ends using a GIA stapler.  Enterotomy was closed with a TA-60 stapler.  Mesenteric defect was closed with interrupted 2-0 silk sutures.  Good hemostasis was noted.  Anastomosis appeared to be widely patent.  The small bowel was returned to the peritoneal cavity.  Fascial edges were dissected out circumferentially.  Hernia sac was excised and discarded.  Fascial defect was then closed with interrupted #1 Novafil simple sutures. Subcutaneous tissues were irrigated.  Subcutaneous tissues were closed with interrupted 3-0 Vicryl sutures.  Skin was closed with stainless steel staples.  Wound was washed and dried and honeycomb dressing was applied.  The patient was awakened from anesthesia and brought to the recovery room.  The patient tolerated the procedure well.   Velora Hecklerodd M. Jospeh Mangel, MD, [facility] Office: [phone]    TMG/MEDQ  D:  06/21/2014  T:  06/21/2014  Job:  [id number]  cc:   Mora ApplPinn, Dr.  Emelda FearFerguson, Dr.

## 2014-06-21 NOTE — MAU Provider Note (Signed)
History     CSN: [id number]  Arrival date and time: 06/21/14 1229   First Provider Initiated Contact with Patient 06/21/14 1303      Chief Complaint  Patient presents with  . Hernia   HPI Carmen L Grewe 24 y.o. [redacted]w[redacted]d  Comes to MAU with nausea and vomiting that started today at 10:30 am.  Has vomited repeatedly and has had a sudden onset of abdominal pain.  Has been diaphoretic and has continued with pain.  Hx of umbilical hernia that occurred with her last pregnancy.  Has always had a hard, but nontender smooth mass slightly larger than a pingpong ball.  Now it is larger and tender.  OB History    Gravida Para Term Preterm AB TAB SAB Ectopic Multiple Living   Past Medical History  Diagnosis Date  . Pregnancy induced hypertension   . Anemia   . Obese   . Hx of varicella   . Asthma     childhood    Past Surgical History  Procedure Laterality Date  . Tonsillectomy    . Cesarean section N/A 03/09/2012    Procedure: CESAREAN SECTION;  Surgeon: Freddrick March. Tenny Craw, MD;  Location: [facility];  Service: Obstetrics;  Laterality: N/A;    Family History  Problem Relation Age of Onset  . Anemia Mother   . Anemia Sister   . Birth defects Brother     heart disorder?  Marland Kitchen Hypertension Maternal Grandmother   . Diabetes Maternal Grandmother   . Hypertension Maternal Grandfather   . Diabetes Maternal Grandfather   . Hypertension Paternal Grandmother   . Hypertension Paternal Grandfather     History  Substance Use Topics  . Smoking status: Never Smoker   . Smokeless tobacco: Never Used  . Alcohol Use: No    Allergies: No Known Allergies  Prescriptions prior to admission  Medication Sig Dispense Refill Last Dose  . acetaminophen (TYLENOL) 325 MG tablet Take 650 mg by mouth every 6 (six) hours as needed for mild pain or headache.   prn  . Famotidine (PEPCID AC PO) Take 1 tablet by mouth daily.   06/20/2014 at Unknown time  . IRON PO Take 1 tablet by mouth daily.    Past Week at Unknown time  . Prenatal Vit-Fe Fumarate-FA (PRENATAL MULTIVITAMIN) TABS tablet Take 2 tablets by mouth 2 (two) times daily.   06/20/2014 at Unknown time  . albuterol (PROVENTIL HFA;VENTOLIN HFA) 108 (90 BASE) MCG/ACT inhaler Inhale 2 puffs into the lungs every 4 (four) hours as needed for wheezing or shortness of breath. (Patient not taking: Reported on 06/21/2014) 1 Inhaler 1   . azithromycin (ZITHROMAX) 250 MG tablet Take 2 tablets x 1 day, then 1 tab daily for 4 days (Patient not taking: Reported on 06/21/2014) 6 tablet 0   . clonazePAM (KLONOPIN) 0.5 MG tablet Take 1 tablet (0.5 mg total) by mouth 2 (two) times daily as needed for anxiety. (Patient not taking: Reported on 06/21/2014) 60 tablet 1   . guaiFENesin-codeine 100-10 MG/5ML syrup Take 5 mLs by mouth every 6 (six) hours as needed for cough. (Patient not taking: Reported on 06/21/2014) 180 mL 0   . sertraline (ZOLOFT) 100 MG tablet Take 1 tablet (100 mg total) by mouth daily. (Patient not taking: Reported on 06/21/2014) 30 tablet 3 Taking    Review of Systems  Constitutional: Negative for fever.  Gastrointestinal: Positive for nausea, vomiting  and abdominal pain.       Umbilical hernia   Physical Exam   Blood pressure 163/90, pulse 83, temperature 97.7 F (36.5 C), temperature source Oral, resp. rate 20, height 5\' 1"  (1.549 m), weight 284 lb (128.822 kg), last menstrual period 02/22/2014, SpO2 100 %.  Physical Exam  Nursing note and vitals reviewed. Constitutional: She is oriented to person, place, and time. She appears well-developed and well-nourished. She appears distressed.  HENT:  Head: Normocephalic.  Eyes: EOM are normal.  Neck: Neck supple.  GI: Soft. There is tenderness. There is guarding. There is no rebound.  Umbilical hernia is large 13-16 cm in diameter.  It is very hard and lumpy.  No discoloration seen.  Is not reducible.  Is very painful to any palpation. Bowel sounds in 4 quadrants but decreased in lower  quadrants bilaterally.  FHT 160 by ultrasound.  Musculoskeletal: Normal range of motion.  Neurological: She is alert and oriented to person, place, and time.  Skin: Skin is warm. There is pallor.  damp  Psychiatric: She has a normal mood and affect.    MAU Course  Procedures Results for orders placed or performed during the hospital encounter of 06/21/14 (from the past 24 hour(s))  CBC     Status: Abnormal   Collection Time: 06/21/14  1:15 PM  Result Value Ref Range   WBC 10.6 (H) 4.0 - 10.5 K/uL   RBC 4.42 3.87 - 5.11 MIL/uL   Hemoglobin 11.1 (L) 12.0 - 15.0 g/dL   HCT 16.133.5 (L) 09.636.0 - 04.546.0 %   MCV 75.8 (L) 78.0 - 100.0 fL   MCH 25.1 (L) 26.0 - 34.0 pg   MCHC 33.1 30.0 - 36.0 g/dL   RDW 40.915.4 81.111.5 - 91.415.5 %   Platelets 193 150 - 400 K/uL  Comprehensive metabolic panel     Status: Abnormal   Collection Time: 06/21/14  1:15 PM  Result Value Ref Range   Sodium 137 135 - 145 mmol/L   Potassium 3.9 3.5 - 5.1 mmol/L   Chloride 108 101 - 111 mmol/L   CO2 22 22 - 32 mmol/L   Glucose, Bld 142 (H) 65 - 99 mg/dL   BUN 6 6 - 20 mg/dL   Creatinine, Ser 7.820.48 0.44 - 1.00 mg/dL   Calcium 8.8 (L) 8.9 - 10.3 mg/dL   Total Protein 7.2 6.5 - 8.1 g/dL   Albumin 3.5 3.5 - 5.0 g/dL   AST 15 15 - 41 U/L   ALT 12 (L) 14 - 54 U/L   Alkaline Phosphatase 69 38 - 126 U/L   Total Bilirubin 0.4 0.3 - 1.2 mg/dL   GFR calc non Af Amer >60 >60 mL/min   GFR calc Af Amer >60 >60 mL/min   Anion gap 7 5 - 15    MDM Consult with Dr. Mora ApplPinn - CT of abdomen with and without contrast.  IV started. Radiology called - CT is not the test they would recommend during the pregnancy, client needs MRI - spoke with Dr. Mora ApplPinn and MRI ordered. Client in pain and Dilaudid IV ordered. 1430  Portable ultrasound to verify FHT - 160, Dr. Emelda FearFerguson in the room supervising the ultrasound and viewed the client's abdomen.  Dr. Emelda FearFerguson called and spoke with Dr. Mora ApplPinn about the plan of care.  Assessment and Plan  Possible  strangulated umbilical hernia   Jaideep Pollack 06/21/2014, 2:12 PM

## 2014-06-21 NOTE — H&P (Signed)
Marilyn Cooper is an 24 y.o. female.    General Surgery [facility]  Chief Complaint: incarcerated umbilical hernia  HPI:  Patient is a 24 yo WF with an incarcerated umbilical hernia.  Hernia has been present for 2 1/2 years and was previously reducible.Over the past 24 hours the hernia has become painful, tense, distended, and non-reducible.  Patient has had nausea and emesis.  She was evaluated at [facility] and transferred to [facility] ER for general surgery to evaluate and manage.  Patient is [redacted] weeks EGA with her second pregnancy.  Prior abdominal surgery includes C-section 2 years ago.  Past Medical History  Diagnosis Date  . Pregnancy induced hypertension   . Anemia   . Obese   . Hx of varicella   . Asthma     childhood    Past Surgical History  Procedure Laterality Date  . Tonsillectomy    . Cesarean section N/A 03/09/2012    Procedure: CESAREAN SECTION;  Surgeon: Farrel Gobble. Harrington Challenger, MD;  Location: [facility];  Service: Obstetrics;  Laterality: N/A;    Family History  Problem Relation Age of Onset  . Anemia Mother   . Anemia Sister   . Birth defects Brother     heart disorder?  Marland Kitchen Hypertension Maternal Grandmother   . Diabetes Maternal Grandmother   . Hypertension Maternal Grandfather   . Diabetes Maternal Grandfather   . Hypertension Paternal Grandmother   . Hypertension Paternal Grandfather    Social History:  reports that she has never smoked. She has never used smokeless tobacco. She reports that she does not drink alcohol or use illicit drugs.  Allergies: No Known Allergies   (Not in a hospital admission)  Results for orders placed or performed during the hospital encounter of 06/21/14 (from the past 48 hour(s))  CBC     Status: Abnormal   Collection Time: 06/21/14  1:15 PM  Result Value Ref Range   WBC 10.6 (H) 4.0 - 10.5 K/uL   RBC 4.42 3.87 - 5.11 MIL/uL   Hemoglobin 11.1 (L) 12.0 - 15.0 g/dL   HCT 33.5 (L) 36.0 - 46.0 %   MCV 75.8 (L) 78.0 - 100.0 fL   MCH 25.1 (L) 26.0 - 34.0 pg   MCHC 33.1 30.0 - 36.0 g/dL   RDW 15.4 11.5 - 15.5 %   Platelets 193 150 - 400 K/uL  Comprehensive metabolic panel     Status: Abnormal   Collection Time: 06/21/14  1:15 PM  Result Value Ref Range   Sodium 137 135 - 145 mmol/L   Potassium 3.9 3.5 - 5.1 mmol/L   Chloride 108 101 - 111 mmol/L   CO2 22 22 - 32 mmol/L   Glucose, Bld 142 (H) 65 - 99 mg/dL   BUN 6 6 - 20 mg/dL   Creatinine, Ser 0.48 0.44 - 1.00 mg/dL   Calcium 8.8 (L) 8.9 - 10.3 mg/dL   Total Protein 7.2 6.5 - 8.1 g/dL   Albumin 3.5 3.5 - 5.0 g/dL   AST 15 15 - 41 U/L   ALT 12 (L) 14 - 54 U/L   Alkaline Phosphatase 69 38 - 126 U/L   Total Bilirubin 0.4 0.3 - 1.2 mg/dL   GFR calc non Af Amer >60 >60 mL/min   GFR calc Af Amer >60 >60 mL/min    Comment: (NOTE) The eGFR has been calculated using the CKD EPI equation. This calculation has not been validated in all clinical situations.  eGFR's persistently <60 mL/min signify possible Chronic Kidney Disease.    Anion gap 7 5 - 15   No results found.  Review of Systems  Constitutional: Negative.   HENT: Negative.   Eyes: Negative.   Respiratory: Negative.   Cardiovascular: Negative.   Gastrointestinal: Positive for nausea, vomiting and abdominal pain.  Genitourinary: Negative.   Musculoskeletal: Negative.   Skin: Negative.   Neurological: Negative.   Endo/Heme/Allergies: Negative.   Psychiatric/Behavioral: Negative.     Blood pressure 154/93, pulse 90, temperature 98 F (36.7 C), temperature source Oral, resp. rate 20, height 5' 1" (1.549 m), weight 128.822 kg (284 lb), last menstrual period 02/22/2014, SpO2 100 %. Physical Exam  Constitutional: She is oriented to person, place, and time. She appears well-developed and well-nourished. No distress.  HENT:  Head: Normocephalic and atraumatic.  Right Ear: External ear normal.  Left Ear: External ear normal.  Eyes: Conjunctivae are normal. Pupils are  equal, round, and reactive to light. No scleral icterus.  Neck: Normal range of motion. Neck supple. No thyromegaly present.  Cardiovascular: Normal rate, regular rhythm and normal heart sounds.   No murmur heard. Respiratory: Effort normal and breath sounds normal. No respiratory distress. She has no wheezes.  GI: Soft. Bowel sounds are normal. She exhibits no distension. There is tenderness.  Obvious incarcerated hernia in midline just above level of umbilicus, firm, tender  Musculoskeletal: Normal range of motion. She exhibits no edema.  Lymphadenopathy:    She has no cervical adenopathy.  Neurological: She is alert and oriented to person, place, and time.  Skin: Skin is warm and dry.  Psychiatric: She has a normal mood and affect. Her behavior is normal.     Assessment/Plan Incarcerated umbilical hernia  Admit to general surgery service  Plan urgent repair of incarcerated umbilical hernia, possible bowel resection  Discussed risks of surgery, especially risk of spontaneous abortion and fetal loss.  Patient also discussed this with Dr. Glo Herring and Dr. Alwyn Pea at [facility].  MRI not performed as patient desires surgical repair whether there is comprimised small bowel or not within the hernia.  The risks and benefits of the procedure have been discussed at length with the patient.  The patient understands the proposed procedure, potential alternative treatments, and the course of recovery to be expected.  All of the patient's questions have been answered at this time.  The patient wishes to proceed with surgery.  Earnstine Regal, MD, [facility] Office: [place] 06/21/2014, 4:24 PM

## 2014-06-21 NOTE — Anesthesia Procedure Notes (Signed)
Procedure Name: Intubation Date/Time: 06/21/2014 5:56 PM Performed by: Delphia GratesHANDLER, Haydn Cush Pre-anesthesia Checklist: Patient identified, Emergency Drugs available, Suction available and Patient being monitored Patient Re-evaluated:Patient Re-evaluated prior to inductionOxygen Delivery Method: Circle system utilized Preoxygenation: Pre-oxygenation with 100% oxygen Intubation Type: IV induction and Rapid sequence Laryngoscope Size: Mac and 4 Grade View: Grade I Tube type: Oral Tube size: 7.5 mm Number of attempts: 1 Airway Equipment and Method: Stylet Placement Confirmation: ETT inserted through vocal cords under direct vision,  positive ETCO2 and breath sounds checked- equal and bilateral Secured at: 22 cm Tube secured with: Tape Dental Injury: Teeth and Oropharynx as per pre-operative assessment

## 2014-06-21 NOTE — Brief Op Note (Signed)
06/21/2014  7:19 PM  PATIENT:  Marilyn Cooper  24 y.o. female  PRE-OPERATIVE DIAGNOSIS:  incarcerated umbilical hernia  POST-OPERATIVE DIAGNOSIS:  incarcerated umbilical hernia with strangulated/infarcted small intestine  PROCEDURE:  Procedure(s): EXPLORATORY LAPAROTOMY,  SMALL BOWEL RESECTION, PRIMARY REPAIR INCARCERATED VENTRAL HERNIA (N/A)  SURGEON:  Surgeon(s) and Role:    * Darnell Levelodd Treavor Blomquist, MD - Primary  ANESTHESIA:   general  EBL:     BLOOD ADMINISTERED:none  DRAINS: none   LOCAL MEDICATIONS USED:  NONE  SPECIMEN:  Excision  DISPOSITION OF SPECIMEN:  PATHOLOGY  COUNTS:  YES  TOURNIQUET:  * No tourniquets in log *  DICTATION: .Other Dictation: Dictation Number [id number]  PLAN OF CARE: Admit to inpatient   PATIENT DISPOSITION:  PACU - hemodynamically stable.   Delay start of Pharmacological VTE agent (>24hrs) due to surgical blood loss or risk of bleeding: yes  Velora Hecklerodd M. Inigo Lantigua, MD, [facility] Office: [phone]

## 2014-06-21 NOTE — MAU Note (Signed)
Pt here for hernia pain. Has been vomiting and hernia had enlarged. Pain rated at 7/8 over 10. Unable to take pain meds r/t vomiting.

## 2014-06-21 NOTE — ED Notes (Signed)
MD at bedside. Dr. Gerrit FriendsGerkin, Surgeon at bedsie.

## 2014-06-22 LAB — CBC
HEMATOCRIT: 34.9 % — AB (ref 36.0–46.0)
HEMOGLOBIN: 11 g/dL — AB (ref 12.0–15.0)
MCH: 24.4 pg — AB (ref 26.0–34.0)
MCHC: 31.5 g/dL (ref 30.0–36.0)
MCV: 77.4 fL — ABNORMAL LOW (ref 78.0–100.0)
Platelets: 227 10*3/uL (ref 150–400)
RBC: 4.51 MIL/uL (ref 3.87–5.11)
RDW: 15.5 % (ref 11.5–15.5)
WBC: 11.6 10*3/uL — ABNORMAL HIGH (ref 4.0–10.5)

## 2014-06-22 LAB — CREATININE, SERUM
Creatinine, Ser: 0.44 mg/dL (ref 0.44–1.00)
GFR calc Af Amer: >60 mL/min (ref >60)
GFR calc non Af Amer: >60 mL/min (ref >60)

## 2014-06-22 NOTE — Progress Notes (Signed)
Patient ID: Marilyn Cooper, female   DOB: 27-Apr-1990, 24 y.o.   MRN: [id number]  General Surgery [facility]  POD#: 1  Subjective: Patient in bed, husband at bedside.  No complaints.  L&D examined baby with USN - good report.  Objective: Vital signs in last 24 hours: Temp:  [97.7 F (36.5 C)-100.7 F (38.2 C)] 98.4 F (36.9 C) (06/05 0531) Pulse Rate:  [77-113] 94 (06/05 0531) Resp:  [12-27] 20 (06/05 0200) BP: (111-172)/(66-102) 137/83 mmHg (06/05 0531) SpO2:  [96 %-100 %] 99 % (06/05 0531) Weight:  [128.822 kg (284 lb)] 128.822 kg (284 lb) (06/04 1247) Last BM Date: 06/21/14  Intake/Output from previous day: 06/04 0701 - 06/05 0700 In: 1500 [I.V.:1500] Out: 650 [Urine:250; Emesis/NG output:400] Intake/Output this shift:    Physical Exam: HEENT - sclerae clear, mucous membranes moist Chest - clear bilaterally Cor - RRR Abdomen - soft, obese; quiet; dressing dry and intact Ext - no edema, non-tender Neuro - alert & oriented, no focal deficits  Lab Results:   Recent Labs  06/21/14 1315 06/22/14 0047  WBC 10.6* 11.6*  HGB 11.1* 11.0*  HCT 33.5* 34.9*  PLT 193 227   BMET  Recent Labs  06/21/14 1315 06/22/14 0047  NA 137  --   K 3.9  --   CL 108  --   CO2 22  --   GLUCOSE 142*  --   BUN 6  --   CREATININE 0.48 0.44  CALCIUM 8.8*  --    PT/INR No results for input(s): LABPROT, INR in the last 72 hours. Comprehensive Metabolic Panel:    Component Value Date/Time   NA 137 06/21/2014 1315   NA 140 02/04/2013 0910   K 3.9 06/21/2014 1315   K 4.2 02/04/2013 0910   CL 108 06/21/2014 1315   CL 106 02/04/2013 0910   CO2 22 06/21/2014 1315   CO2 25 02/04/2013 0910   BUN 6 06/21/2014 1315   BUN 7 02/04/2013 0910   CREATININE 0.44 06/22/2014 0047   CREATININE 0.48 06/21/2014 1315   CREATININE 0.54 02/04/2013 0910   CREATININE 0.58 02/27/2012 1230   GLUCOSE 142* 06/21/2014 1315   GLUCOSE 102* 02/04/2013 0910   CALCIUM 8.8*  06/21/2014 1315   CALCIUM 8.9 02/04/2013 0910   AST 15 06/21/2014 1315   AST 16 03/08/2012 2000   ALT 12* 06/21/2014 1315   ALT 8 03/08/2012 2000   ALKPHOS 69 06/21/2014 1315   ALKPHOS 130* 03/08/2012 2000   BILITOT 0.4 06/21/2014 1315   BILITOT 0.1* 03/08/2012 2000   PROT 7.2 06/21/2014 1315   PROT 6.6 03/08/2012 2000   ALBUMIN 3.5 06/21/2014 1315   ALBUMIN 2.7* 03/08/2012 2000    Studies/Results: [place] Ob Comp Less 14 Wks  06/22/2014   CLINICAL DATA:  Assess fetal heart tones after hernia repair. Initial encounter.  EXAM: OBSTETRIC <14 WK ULTRASOUND  TECHNIQUE: Transabdominal ultrasound was performed for evaluation of the gestation as well as the maternal uterus and adnexal regions.  COMPARISON:  None.  FINDINGS: Intrauterine gestational sac: Visualized/normal in shape.  Yolk sac:  Yes  Embryo:  Yes  Cardiac Activity: Yes  Heart Rate: 163 bpm  CRL:   6.78 cm   13 w 0 d                  US EDC: 12/27/2014  Maternal uterus/adnexae: No subchorionic hemorrhage is noted. The uterus is otherwise unremarkable.  The ovaries are not visualized on this  study.  No free fluid is seen within the pelvic cul-de-sac.  IMPRESSION: Single live intrauterine pregnancy noted, with a crown-rump length of 6.8 cm, corresponding to a gestational age of [redacted] weeks 0 days. This matches the gestational age by LMP of 13 weeks 4 days, reflecting an estimated date of delivery of December 23, 2014.   Electronically Signed   By: Roanna Raider M.D.   On: 06/22/2014 01:27    Anti-infectives: Anti-infectives    Start     Dose/Rate Route Frequency Ordered Stop   06/22/14 0600  ertapenem (INVANZ) 1 g in sodium chloride 0.9 % 50 mL IVPB  Status:  Discontinued     1 g 100 mL/hr over 30 Minutes Intravenous On call to O.R. 06/21/14 1815 06/21/14 2150      Assessment & Plans: Status post ex lap with small bowel resection  Continue NG, NPO, IVF  OOB, ambulate in halls  Velora Heckler, MD, [facility] Office: [phone]   Marilyn Cooper Judie Petit 06/22/2014

## 2014-06-23 ENCOUNTER — Encounter (HOSPITAL_COMMUNITY): Payer: Self-pay | Admitting: Surgery

## 2014-06-23 NOTE — Progress Notes (Signed)
Patient ID: Marilyn Cooper, female   DOB: 06-Jan-1991, 24 y.o.   MRN: [id number]  General Surgery [facility]  POD#: 2  Subjective: Patient in bed, pain controlled.  No flatus or BM.  Objective: Vital signs in last 24 hours: Temp:  [97.8 F (36.6 C)-98.6 F (37 C)] 97.8 F (36.6 C) (06/06 0523) Pulse Rate:  [54-96] 54 (06/06 0523) Resp:  [18-20] 20 (06/06 0523) BP: (146-168)/(79-84) 158/79 mmHg (06/06 0523) SpO2:  [98 %-100 %] 100 % (06/06 0523) Last BM Date: 06/21/14  Intake/Output from previous day: 06/05 0701 - 06/06 0700 In: -  Out: 2750 [Urine:1650; Emesis/NG output:1100] Intake/Output this shift:    Physical Exam: HEENT - sclerae clear, mucous membranes moist Neck - soft Chest - clear bilaterally Cor - RRR Abdomen - soft, obese; active BS present; thin bilious in NG tube; wound dry and intact Ext - no edema, non-tender Neuro - alert & oriented, no focal deficits  Lab Results:   Recent Labs  06/21/14 1315 06/22/14 0047  WBC 10.6* 11.6*  HGB 11.1* 11.0*  HCT 33.5* 34.9*  PLT 193 227   BMET  Recent Labs  06/21/14 1315 06/22/14 0047  NA 137  --   K 3.9  --   CL 108  --   CO2 22  --   GLUCOSE 142*  --   BUN 6  --   CREATININE 0.48 0.44  CALCIUM 8.8*  --    PT/INR No results for input(s): LABPROT, INR in the last 72 hours. Comprehensive Metabolic Panel:    Component Value Date/Time   NA 137 06/21/2014 1315   NA 140 02/04/2013 0910   K 3.9 06/21/2014 1315   K 4.2 02/04/2013 0910   CL 108 06/21/2014 1315   CL 106 02/04/2013 0910   CO2 22 06/21/2014 1315   CO2 25 02/04/2013 0910   BUN 6 06/21/2014 1315   BUN 7 02/04/2013 0910   CREATININE 0.44 06/22/2014 0047   CREATININE 0.48 06/21/2014 1315   CREATININE 0.54 02/04/2013 0910   CREATININE 0.58 02/27/2012 1230   GLUCOSE 142* 06/21/2014 1315   GLUCOSE 102* 02/04/2013 0910   CALCIUM 8.8* 06/21/2014 1315   CALCIUM 8.9 02/04/2013 0910   AST 15 06/21/2014 1315   AST 16  03/08/2012 2000   ALT 12* 06/21/2014 1315   ALT 8 03/08/2012 2000   ALKPHOS 69 06/21/2014 1315   ALKPHOS 130* 03/08/2012 2000   BILITOT 0.4 06/21/2014 1315   BILITOT 0.1* 03/08/2012 2000   PROT 7.2 06/21/2014 1315   PROT 6.6 03/08/2012 2000   ALBUMIN 3.5 06/21/2014 1315   ALBUMIN 2.7* 03/08/2012 2000    Studies/Results: US Ob Comp Less 14 Wks  06/22/2014   CLINICAL DATA:  Assess fetal heart tones after hernia repair. Initial encounter.  EXAM: OBSTETRIC <14 WK ULTRASOUND  TECHNIQUE: Transabdominal ultrasound was performed for evaluation of the gestation as well as the maternal uterus and adnexal regions.  COMPARISON:  None.  FINDINGS: Intrauterine gestational sac: Visualized/normal in shape.  Yolk sac:  Yes  Embryo:  Yes  Cardiac Activity: Yes  Heart Rate: 163 bpm  CRL:   6.78 cm   13 w 0 d                  [place] EDC: 12/27/2014  Maternal uterus/adnexae: No subchorionic hemorrhage is noted. The uterus is otherwise unremarkable.  The ovaries are not visualized on this study.  No free fluid is seen within the pelvic  cul-de-sac.  IMPRESSION: Single live intrauterine pregnancy noted, with a crown-rump length of 6.8 cm, corresponding to a gestational age of [redacted] weeks 0 days. This matches the gestational age by LMP of 13 weeks 4 days, reflecting an estimated date of delivery of December 23, 2014.   Electronically Signed   By: Roanna RaiderJeffery  Chang M.D.   On: 06/22/2014 01:27    Anti-infectives: Anti-infectives    Start     Dose/Rate Route Frequency Ordered Stop   06/22/14 0600  ertapenem (INVANZ) 1 g in sodium chloride 0.9 % 50 mL IVPB  Status:  Discontinued     1 g 100 mL/hr over 30 Minutes Intravenous On call to O.R. 06/21/14 1815 06/21/14 2150      Assessment & Plans: Status post small bowel resection, repair inc umbilical hernia  Discontinue NG tube  Begin clear liquid diet  OOB, ambulate in halls  Velora Hecklerodd M. Marie Borowski, MD, [facility] Office: [phone]   Shey Yott  Judie PetitM 06/23/2014

## 2014-06-24 MED ORDER — ACETAMINOPHEN 325 MG PO TABS
650.0000 mg | ORAL_TABLET | Freq: Four times a day (QID) | ORAL | Status: DC | PRN
  Administered 2014-06-24: 650 mg via ORAL
  Filled 2014-06-24: qty 2

## 2014-06-24 MED ORDER — OXYCODONE-ACETAMINOPHEN 5-325 MG PO TABS: 1.0000 | ORAL_TABLET | ORAL | Status: DC | PRN

## 2014-06-24 MED ORDER — ACETAMINOPHEN 325 MG PO TABS: ORAL_TABLET | ORAL | Status: DC

## 2014-06-24 MED ORDER — OXYCODONE-ACETAMINOPHEN 5-325 MG PO TABS
1.0000 | ORAL_TABLET | ORAL | Status: DC | PRN
  Administered 2014-06-24 – 2014-06-25 (×4): 1 via ORAL
  Filled 2014-06-24: qty 1
  Filled 2014-06-24: qty 2
  Filled 2014-06-24 (×2): qty 1

## 2014-06-24 MED ORDER — IBUPROFEN 200 MG PO TABS: 600.0000 mg | ORAL_TABLET | Freq: Four times a day (QID) | ORAL | Status: DC | PRN

## 2014-06-24 NOTE — Discharge Instructions (Signed)
[facility] _______[facility]  UMBILICAL OR INGUINAL HERNIA REPAIR: POST OP INSTRUCTIONS  Always review your discharge instruction sheet given to you by the facility where your surgery was performed. IF YOU HAVE DISABILITY OR FAMILY LEAVE FORMS, YOU MUST BRING THEM TO THE OFFICE FOR PROCESSING.   DO NOT GIVE THEM TO YOUR DOCTOR.  1. A  prescription for pain medication may be given to you upon discharge.  Take your pain medication as prescribed, if needed.  If narcotic pain medicine is not needed, then you may take acetaminophen (Tylenol) or ibuprofen (Advil) as needed. 2. Take your usually prescribed medications unless otherwise directed. 3. If you need a refill on your pain medication, please contact your pharmacy.  They will contact our office to request authorization. Prescriptions will not be filled after 5 pm or on week-ends. 4. You should follow a light diet the first 24 hours after arrival home, such as soup and crackers, etc.  Be sure to include lots of fluids daily.  Resume your normal diet the day after surgery. 5. Most patients will experience some swelling and bruising around the umbilicus or in the groin and scrotum.  Ice packs and reclining will help.  Swelling and bruising can take several days to resolve.  6. It is common to experience some constipation if taking pain medication after surgery.  Increasing fluid intake and taking a stool softener (such as Colace) will usually help or prevent this problem from occurring.  A mild laxative (Milk of Magnesia or Miralax) should be taken according to package directions if there are no bowel movements after 48 hours. 7. Unless discharge instructions indicate otherwise, you may remove your bandages 24-48 hours after surgery, and you may shower at that time.  You may have steri-strips (small skin tapes) in place directly over the incision.  These strips should be left on the skin for 7-10 days.  If your surgeon used skin glue on the  incision, you may shower in 24 hours.  The glue will flake off over the next 2-3 weeks.  Any sutures or staples will be removed at the office during your follow-up visit. 8. ACTIVITIES:  You may resume regular (light) daily activities beginning the next day--such as daily self-care, walking, climbing stairs--gradually increasing activities as tolerated.  You may have sexual intercourse when it is comfortable.  Refrain from any heavy lifting or straining until approved by your doctor. a. You may drive when you are no longer taking prescription pain medication, you can comfortably wear a seatbelt, and you can safely maneuver your car and apply brakes. b. RETURN TO WORK:  __No lifting over 20 pounds for 4 weeks from date of surgery. c. _____________________________________________ 9. You should see your doctor in the office for a follow-up appointment approximately 2-3 weeks after your surgery.  Make sure that you call for this appointment within a day or two after you arrive home to insure a convenient appointment time. 10. OTHER INSTRUCTIONS:  __________________________________________________________________________________________________________________________________________________________________________________________  WHEN TO CALL YOUR DOCTOR: 1. Fever over 101.0 2. Inability to urinate 3. Nausea and/or vomiting 4. Extreme swelling or bruising 5. Continued bleeding from incision. 6. Increased pain, redness, or drainage from the incision  The clinic staff is available to answer your questions during regular business hours.  Please dont hesitate to call and ask to speak to one of the nurses for clinical concerns.  If you have a medical emergency, go to the nearest emergency room or call 911.  A surgeon  from [facility] is always on call at the hospital   [address], [address], [place], [place]  [id number] ?  [address] [address], [place], [place]   [id number] [phone] ?  [phone] ? FAX [phone] Web site: [email or web address] Hernia Repair Care After These instructions give you information on caring for yourself after your procedure. Your doctor may also give you more specific instructions. Call your doctor if you have any problems or questions after your procedure. HOME CARE   You may have changes in your poops (bowel movements).  You may have loose or watery poop (diarrhea).  You may be not able to poop.  Your bowels will slowly get back to normal.  Do not eat any food that makes you sick to your stomach (nauseous). Eat small meals 4 to 6 times a day instead of 3 large ones.  Do not drink pop. It will give you gas.  Do not drink alcohol.  Do not lift anything heavier than 10 pounds. This is about the weight of a gallon of milk.  Do not do anything that makes you very tired for at least 6 weeks.  Do not get your wound wet for 2 days.  You may take a sponge bath during this time.  After 2 days you may take a shower. Gently pat your surgical cut (incision) dry with a towel. Do not rub it.  For men: You may have been given an athletic supporter (scrotal support) before you left the hospital. It holds your scrotum and testicles closer to your body so there is no strain on your wound. Wear the supporter until your doctor tells you that you do not need it anymore. GET HELP RIGHT AWAY IF:  You have watery poop, or cannot poop for more than 3 days.  You feel sick to your stomach or throw up (vomit) more than 2 or 3 times.  You have temperature by mouth above 102 F (38.9 C).  You see redness or puffiness (swelling) around your wound.  You see yellowish white fluid (pus) coming from your wound.  You see a bulge or bump in your lower belly (abdomen) or near your groin.  You develop a rash, trouble breathing, or any other symptoms from medicines taken. MAKE SURE YOU:  Understand these instructions.  Will watch your  condition.  Will get help right away if your are not doing well or get worse. Document Released: 12/17/2007 Document Revised: 03/28/2011 Document Reviewed: 12/17/2007 [facility] Patient Information 2015 [employer] [place]. This information is not intended to replace advice given to you by your health care provider. Make sure you discuss any questions you have with your health care provider.

## 2014-06-24 NOTE — Progress Notes (Signed)
3 Days Post-Op  Subjective: She gets reflux with liquids, but is doing OK, no flatus so far.  She has been up walking.  We will advance her diet and if she is doing well consider discharge later today.  Objective: Vital signs in last 24 hours: Temp:  [89.3 F (31.8 C)-98.2 F (36.8 C)] 97.9 F (36.6 C) (06/07 0554) Pulse Rate:  [86-98] 86 (06/07 0554) Resp:  [18-20] 18 (06/07 0554) BP: (133-168)/(67-83) 136/83 mmHg (06/07 0554) SpO2:  [99 %] 99 % (06/07 0554) Last BM Date: 06/21/14 240 PO Diet: clears Afebrile, VSS No labs Intake/Output from previous day: 06/06 0701 - 06/07 0700 In: 5040.8 [P.O.:240; I.V.:4800.8] Out: 3000 [Urine:3000] Intake/Output this shift:    General appearance: alert, cooperative and no distress GI: soft sore, incision looks fine.  Lab Results:   Recent Labs  06/21/14 1315 06/22/14 0047  WBC 10.6* 11.6*  HGB 11.1* 11.0*  HCT 33.5* 34.9*  PLT 193 227    BMET  Recent Labs  06/21/14 1315 06/22/14 0047  NA 137  --   K 3.9  --   CL 108  --   CO2 22  --   GLUCOSE 142*  --   BUN 6  --   CREATININE 0.48 0.44  CALCIUM 8.8*  --    PT/INR No results for input(s): LABPROT, INR in the last 72 hours.   Recent Labs Lab 06/21/14 1315  AST 15  ALT 12*  ALKPHOS 69  BILITOT 0.4  PROT 7.2  ALBUMIN 3.5     Lipase  No results found for: LIPASE   Studies/Results: No results found.  Medications: . enoxaparin (LOVENOX) injection  40 mg Subcutaneous Q24H    Assessment/Plan Strangulated ventral hernia with infarcted loop of small bowel. Exploratory laparotomy, Small bowel resection with primary anastomosis.  Primary repair of ventral hernia, 06/21/14, Dr. Darnell Levelodd Gerkin Pregnancy induced hypertension Gestation 13 weeks Anemia Asthma Body mass index is 53.69  Antibiotics:  None DVT:  Lovenox  Plan:  Advance diet and consider discharge later, no flatus so far.      LOS: 3 days    Rjay Revolorio 06/24/2014

## 2014-06-24 NOTE — Discharge Summary (Signed)
Physician Discharge Summary  Patient ID: Marilyn Cooper MRN: [id number] DOB/AGE: August 14, 1990 24 y.o.  Admit date: 06/21/2014 Discharge date: 06/25/2014  Admission Diagnoses:  Strangulated ventral hernia with infarcted loop of small bowel. Pregnancy induced hypertension Gestation 13 weeks Anemia Asthma Body mass index is 53.69    Discharge Diagnoses:  Same  Principal Problem:   Incarcerated umbilical hernia Active Problems:   Strangulated ventral hernia   PROCEDURES:  Exploratory laparotomy, Small bowel resection with primary anastomosis. Primary repair of ventral hernia, 06/21/14, Dr. Alean Rinneodd Gerkin   Hospital Course:  Patient is a 24 yo WF with an incarcerated umbilical hernia. Hernia has been present for 2 1/2 years and was previously reducible.Over the past 24 hours the hernia has become painful, tense, distended, and non-reducible. Patient has had nausea and emesis. [facility] general surgery to evaluate and manage. Patient is [redacted] weeks EGA with her second pregnancy. Prior abdominal surgery includes C-section 2 years ago. Post op she has done well she is up walking, we have advanced her diet and we hope to send her home today. No lifting over 20 pounds for 4 weeks.  Clean site with soap and water.  She will see her OB-Gyn tomorrow for 12 week follow up.  She will get her staples out in our office on 07/01/14 with nurse visit.   Prior to Admission medications   Medication Sig Start Date End Date Taking? Authorizing Provider  acetaminophen (TYLENOL) 325 MG tablet Take 650 mg by mouth every 6 (six) hours as needed for mild pain or headache.   Yes Historical Provider, MD  Famotidine (PEPCID AC PO) Take 1 tablet by mouth daily.   Yes Historical Provider, MD  IRON PO Take 1 tablet by mouth daily.   Yes Historical Provider, MD  Prenatal Vit-Fe Fumarate-FA (PRENATAL MULTIVITAMIN) TABS tablet Take 2 tablets by mouth 2  (two) times daily.   Yes Historical Provider, MD  acetaminophen (TYLENOL) 325 MG tablet Do not take more than 4000 mg of Tylenol (acetaminophen) per day. 06/24/14   Sherrie GeorgeWillard Samauri Kellenberger, PA-C  albuterol (PROVENTIL HFA;VENTOLIN HFA) 108 (90 BASE) MCG/ACT inhaler Inhale 2 puffs into the lungs every 4 (four) hours as needed for wheezing or shortness of breath. Patient not taking: Reported on 06/21/2014 03/11/14   Salley ScarletKawanta F Williamson, MD  azithromycin [facility](ZITHROMAX) 250 MG tablet Take 2 tablets x 1 day, then 1 tab daily for 4 days Patient not taking: Reported on 06/21/2014 03/11/14   Salley ScarletKawanta F Leland, MD  clonazePAM (KLONOPIN) 0.5 MG tablet Take 1 tablet (0.5 mg total) by mouth 2 (two) times daily as needed for anxiety. Patient not taking: Reported on 06/21/2014 03/11/14   Salley ScarletKawanta F Mattawa, MD  guaiFENesin-codeine 100-10 MG/5ML syrup Take 5 mLs by mouth every 6 (six) hours as needed for cough. Patient not taking: Reported on 06/21/2014 03/11/14   Salley ScarletKawanta F Vilas, MD  oxyCODONE-acetaminophen (PERCOCET/ROXICET) 5-325 MG per tablet Take 1-2 tablets by mouth every 4 (four) hours as needed for moderate pain. 06/24/14   Sherrie GeorgeWillard Annarae Macnair, PA-C  sertraline (ZOLOFT) 100 MG tablet Take 1 tablet (100 mg total) by mouth daily. Patient not taking: Reported on 06/21/2014 03/18/13   Dorena BodoMary B Dixon, PA-C   CBC Latest Ref Rng 06/22/2014 06/21/2014 03/10/2012  WBC 4.0 - 10.5 K/uL 11.6(H) 10.6(H) 8.9  Hemoglobin 12.0 - 15.0 g/dL 11.0(L) 11.1(L) 8.2(L)  Hematocrit 36.0 - 46.0 % 34.9(L) 33.5(L) 25.1(L)  Platelets 150 - 400 K/uL  227 193 152   CMP Latest Ref Rng 06/22/2014 06/21/2014 02/04/2013  Glucose 65 - 99 mg/dL - 161(W) 960(A)  BUN 6 - 20 mg/dL - 6 7  Creatinine 5.40 - 1.00 mg/dL 9.81 1.91 4.78  Sodium 135 - 145 mmol/L - 137 140  Potassium 3.5 - 5.1 mmol/L - 3.9 4.2  Chloride 101 - 111 mmol/L - 108 106  CO2 22 - 32 mmol/L - 22 25  Calcium 8.9 - 10.3 mg/dL - 8.8(L) 8.9  Total Protein 6.5 - 8.1 g/dL - 7.2 -  Total Bilirubin 0.3 - 1.2 mg/dL - 0.4 -   Alkaline Phos 38 - 126 U/L - 69 -  AST 15 - 41 U/L - 15 -  ALT 14 - 54 U/L - 12(L) -    Condition on d/c:  improved   Disposition: 01-Home or Self Care     Medication List    STOP taking these medications        azithromycin 250 MG tablet  Commonly known as:  ZITHROMAX     clonazePAM 0.5 MG tablet  Commonly known as:  KLONOPIN      TAKE these medications        acetaminophen 325 MG tablet  Commonly known as:  TYLENOL  Do not take more than 4000 mg of Tylenol (acetaminophen) per day.     albuterol 108 (90 BASE) MCG/ACT inhaler  Commonly known as:  PROVENTIL HFA;VENTOLIN HFA  Inhale 2 puffs into the lungs every 4 (four) hours as needed for wheezing or shortness of breath.     guaiFENesin-codeine 100-10 MG/5ML syrup  Take 5 mLs by mouth every 6 (six) hours as needed for cough.     IRON PO  Take 1 tablet by mouth daily.     oxyCODONE-acetaminophen 5-325 MG per tablet  Commonly known as:  PERCOCET/ROXICET  Take 1-2 tablets by mouth every 4 (four) hours as needed for moderate pain.     PEPCID AC PO  Take 1 tablet by mouth daily.     prenatal multivitamin Tabs tablet  Take 2 tablets by mouth 2 (two) times daily.     sertraline 100 MG tablet  Commonly known as:  ZOLOFT  Take 1 tablet (100 mg total) by mouth daily.       Follow-up Information    Follow up with Velora Heckler, MD. Schedule an appointment as soon as possible for a visit in 2 weeks.   Specialty:  General Surgery   Contact information:   [address] [place] [address] [phone]       Call [facility] BETH, PA-C.   Specialty:  Physician Assistant   Why:  As needed for Medical issues   Contact information:   [address] [place] [address] [phone]       Follow up with Velora Heckler, MD On 07/01/2014.   Specialty:  General Surgery   Why:  You have an appointment for staple removal with the Nurse at 10:30 AM, be there 30 minutes early for check in.      Contact information:   [address] [place] [address] [phone]       Signed: Sherrie George 06/25/2014, 2:31 PM

## 2014-06-24 NOTE — Progress Notes (Signed)
Did well with lunch, no flatus and very anxious about going home. Will continue to observe and ambulate today.

## 2014-06-25 MED ORDER — POLYETHYLENE GLYCOL 3350 17 G PO PACK
17.0000 g | PACK | Freq: Every day | ORAL | Status: DC
  Administered 2014-06-25: 17 g via ORAL
  Filled 2014-06-25: qty 1

## 2014-06-25 NOTE — Care Management Note (Signed)
Case Management Note  Patient Details  Name: Marilyn Cooper MRN: [id number] Date of Birth: 1991/01/11  Subjective/Objective:                    Action/Plan:d/c home no needs or orders.   Expected Discharge Date:                  Expected Discharge Plan:  Home/Self Care  In-House Referral:     Discharge planning Services  CM Consult  Post Acute Care Choice:    Choice offered to:     DME Arranged:    DME Agency:     HH Arranged:    HH Agency:     Status of Service:  Completed, signed off  Medicare Important Message Given:    Date Medicare IM Given:    Medicare IM give by:    Date Additional Medicare IM Given:    Additional Medicare Important Message give by:     If discussed at Long Length of Stay Meetings, dates discussed:    Additional Comments:  Marilyn Cooper, Marilyn Freund, RN 06/25/2014, 2:51 PM

## 2014-06-25 NOTE — Progress Notes (Signed)
4 Days Post-Op  Subjective: Walking hall this AM.  Says she feels better walking.  No Bm so far.  No further emesis.   She is very anxious wants dressing over incision to protect from 24 yr old at home.  Objective: Vital signs in last 24 hours: Temp:  [97.6 F (36.4 C)-98.3 F (36.8 C)] 97.6 F (36.4 C) (06/08 16100638) Pulse Rate:  [82-99] 82 (06/08 0638) Resp:  [18-20] 18 (06/08 [id number]) BP: (140-159)/(72-89) 151/82 mmHg (06/08 0638) SpO2:  [98 %-100 %] 100 % (06/07 2230) Last BM Date: 06/21/14 Emesis x 1 yesterday Afebrile VSS  Intake/Output from previous day: 06/07 0701 - 06/08 0700 In: 1420 [I.V.:1420] Out: 450 [Urine:450] Intake/Output this shift:    General appearance: alert, cooperative and no distress GI: soft sore, site looks fine.  she will need staples out at home .  BS hypoacitve  Lab Results:  No results for input(s): WBC, HGB, HCT, PLT in the last 72 hours.  BMET No results for input(s): NA, K, CL, CO2, GLUCOSE, BUN, CREATININE, CALCIUM in the last 72 hours. PT/INR No results for input(s): LABPROT, INR in the last 72 hours.   Recent Labs Lab 06/21/14 1315  AST 15  ALT 12*  ALKPHOS 69  BILITOT 0.4  PROT 7.2  ALBUMIN 3.5     Lipase  No results found for: LIPASE   Studies/Results: No results found.  Medications: . enoxaparin (LOVENOX) injection  40 mg Subcutaneous Q24H    Assessment/Plan Exploratory laparotomy, Small bowel resection with primary anastomosis. Primary repair of ventral hernia, 06/21/14, Dr. Darnell Levelodd Gerkin Pregnancy induced hypertension Gestation 13 weeks Anemia Asthma Body mass index is 53.69  Antibiotics: None DVT: Lovenox/walking allot   Plan:  Saline lock, Miralax, soft diet as tolerated.      LOS: 4 days    Carrianne Hyun 06/25/2014

## 2014-06-26 LAB — OB RESULTS CONSOLE ABO/RH: RH TYPE: POSITIVE

## 2014-06-26 LAB — OB RESULTS CONSOLE RPR: RPR: NONREACTIVE

## 2014-06-26 LAB — OB RESULTS CONSOLE RUBELLA ANTIBODY, IGM: Rubella: IMMUNE

## 2014-06-26 LAB — OB RESULTS CONSOLE HEPATITIS B SURFACE ANTIGEN: Hepatitis B Surface Ag: NEGATIVE

## 2014-06-26 LAB — OB RESULTS CONSOLE HIV ANTIBODY (ROUTINE TESTING): HIV: NONREACTIVE

## 2014-09-21 ENCOUNTER — Inpatient Hospital Stay (HOSPITAL_COMMUNITY)
Admission: AD | Admit: 2014-09-21 | Discharge: 2014-09-21 | Disposition: A | Discharge status: Home / Self Care | Payer: Medicaid Other | Source: Ambulatory Visit | Attending: Obstetrics | Admitting: Obstetrics

## 2014-09-21 ENCOUNTER — Encounter (HOSPITAL_COMMUNITY): Payer: Self-pay | Admitting: *Deleted

## 2014-09-21 DIAGNOSIS — O4690 Antepartum hemorrhage, unspecified, unspecified trimester: Secondary | ICD-10-CM

## 2014-09-21 DIAGNOSIS — Z3A25 25 weeks gestation of pregnancy: Secondary | ICD-10-CM | POA: Insufficient documentation

## 2014-09-21 DIAGNOSIS — O4692 Antepartum hemorrhage, unspecified, second trimester: Secondary | ICD-10-CM | POA: Insufficient documentation

## 2014-09-21 LAB — URINALYSIS, ROUTINE W REFLEX MICROSCOPIC
Bilirubin Urine: NEGATIVE
GLUCOSE, UA: NEGATIVE mg/dL
Ketones, ur: NEGATIVE mg/dL
Nitrite: NEGATIVE
PH: 5.5 (ref 5.0–8.0)
PROTEIN: NEGATIVE mg/dL
Urobilinogen, UA: 0.2 mg/dL (ref 0.0–1.0)

## 2014-09-21 LAB — URINE MICROSCOPIC-ADD ON

## 2014-09-21 NOTE — Discharge Instructions (Signed)
Vaginal Bleeding During Pregnancy, Second Trimester °A small amount of bleeding (spotting) from the vagina is relatively common in pregnancy. It usually stops on its own. Various things can cause bleeding or spotting in pregnancy. Some bleeding may be related to the pregnancy, and some may not. Sometimes the bleeding is normal and is not a problem. However, bleeding can also be a sign of something serious. Be sure to tell your health care provider about any vaginal bleeding right away. °Some possible causes of vaginal bleeding during the second trimester include: °· Infection, inflammation, or growths on the cervix.   °· The placenta may be partially or completely covering the opening of the cervix inside the uterus (placenta previa). °· The placenta may have separated from the uterus (abruption of the placenta).   °· You may be having early (preterm) labor.   °· The cervix may not be strong enough to keep a baby inside the uterus (cervical insufficiency).   °· Tiny cysts may have developed in the uterus instead of pregnancy tissue (molar pregnancy).  °HOME CARE INSTRUCTIONS  °Watch your condition for any changes. The following actions may help to lessen any discomfort you are feeling: °· Follow your health care provider's instructions for limiting your activity. If your health care provider orders bed rest, you may need to stay in bed and only get up to use the bathroom. However, your health care provider may allow you to continue light activity. °· If needed, make plans for someone to help with your regular activities and responsibilities while you are on bed rest. °· Keep track of the number of pads you use each day, how often you change pads, and how soaked (saturated) they are. Write this down. °· Do not use tampons. Do not douche. °· Do not have sexual intercourse or orgasms until approved by your health care provider. °· If you pass any tissue from your vagina, save the tissue so you can show it to your  health care provider. °· Only take over-the-counter or prescription medicines as directed by your health care provider. °· Do not take aspirin because it can make you bleed. °· Do not exercise or perform any strenuous activities or heavy lifting without your health care provider's permission. °· Keep all follow-up appointments as directed by your health care provider. °SEEK MEDICAL CARE IF: °· You have any vaginal bleeding during any part of your pregnancy. °· You have cramps or labor pains. °· You have a fever, not controlled by medicine. °SEEK IMMEDIATE MEDICAL CARE IF:  °· You have severe cramps in your back or belly (abdomen). °· You have contractions. °· You have chills. °· You pass large clots or tissue from your vagina. °· Your bleeding increases. °· You feel light-headed or weak, or you have fainting episodes. °· You are leaking fluid or have a gush of fluid from your vagina. °MAKE SURE YOU: °· Understand these instructions. °· Will watch your condition. °· Will get help right away if you are not doing well or get worse. °Document Released: 10/13/2004 Document Revised: 01/08/2013 Document Reviewed: 09/10/2012 °ExitCare® Patient Information ©2015 [employer]. This information is not intended to replace advice given to you by your health care provider. Make sure you discuss any questions you have with your health care provider. ° °Pelvic Rest °Pelvic rest is sometimes recommended for women when:  °· The placenta is partially or completely covering the opening of the cervix (placenta previa). °· There is bleeding between the uterine wall and the amniotic sac in the   first trimester (subchorionic hemorrhage). °· The cervix begins to open without labor starting (incompetent cervix, cervical insufficiency). °· The labor is too early (preterm labor). °HOME CARE INSTRUCTIONS °· Do not have sexual intercourse, stimulation, or an orgasm. °· Do not use tampons, douche, or put anything in the vagina. °· Do not lift  anything over 10 pounds (4.5 kg). °· Avoid strenuous activity or straining your pelvic muscles. °SEEK MEDICAL CARE IF:  °· You have any vaginal bleeding during pregnancy. Treat this as a potential emergency. °· You have cramping pain felt low in the stomach (stronger than menstrual cramps). °· You notice vaginal discharge (watery, mucus, or bloody). °· You have a low, dull backache. °· There are regular contractions or uterine tightening. °SEEK IMMEDIATE MEDICAL CARE IF: °You have vaginal bleeding and have placenta previa.  °Document Released: 04/30/2010 Document Revised: 03/28/2011 Document Reviewed: 04/30/2010 °ExitCare® Patient Information ©2015 [employer]. This information is not intended to replace advice given to you by your health care provider. Make sure you discuss any questions you have with your health care provider. ° °

## 2014-09-21 NOTE — MAU Note (Signed)
Patient is a G5P1 26weeks previous c/section here for bleeding.   States having small amount of red blood noted when wiping this morning.  No pain.  Reports +fetal movement.  Denies recent sexual intercourse.  Had hernia repair surgery 12 weeks ago.  Also states being told she had partial placenta previa 6 weeks ago that resolved as of two weeks ago.  Planning a VBAC.

## 2014-09-21 NOTE — MAU Provider Note (Signed)
History     CSN: [id number]  Arrival date and time: 09/21/14 1023   First Provider Initiated Contact with Patient 09/21/14 1115      Chief Complaint  Patient presents with  . Vaginal Bleeding   HPI Marilyn Cooper 24 y.o. [id number] @[redacted]w[redacted]d  presents complaining of vaginal bleeding.  She awoke this am and noted the blood when wiping after using bathroom.  She felt like it was more than just spotting but not enough to soak a pad.  It was bright red.  She has not noted it since but has ben feeling like something was coming out.  She notes abdominal pressure.  She denies abdominal pain/dysuria/SOB, dysurian.  Last IC 4-5 days ago.  OB History    Gravida Para Term Preterm AB TAB SAB Ectopic Multiple Living   5 1 1  3 3    1       Past Medical History  Diagnosis Date  . Pregnancy induced hypertension   . Anemia   . Obese   . Hx of varicella   . Asthma     childhood    Past Surgical History  Procedure Laterality Date  . Tonsillectomy    . Cesarean section N/A 03/09/2012    Procedure: CESAREAN SECTION;  Surgeon: Freddrick March. Tenny Craw, MD;  Location: [facility];  Service: Obstetrics;  Laterality: N/A;  . Umbilical hernia repair N/A 06/21/2014    Procedure: EXPLORATORY LAPAROTOMY,  SMALL BOWEL RESECTION, PRIMARY REPAIR INCARCERATED VENTRAL HERNIA;  Surgeon: Darnell Level, MD;  Location: [facility];  Service: General;  Laterality: N/A;    Family History  Problem Relation Age of Onset  . Anemia Mother   . Anemia Sister   . Birth defects Brother     heart disorder?  Marland Kitchen Hypertension Maternal Grandmother   . Diabetes Maternal Grandmother   . Hypertension Maternal Grandfather   . Diabetes Maternal Grandfather   . Hypertension Paternal Grandmother   . Hypertension Paternal Grandfather     Social History  Substance Use Topics  . Smoking status: Never Smoker   . Smokeless tobacco: Never Used  . Alcohol Use: No    Allergies: No Known Allergies  Prescriptions prior to admission  Medication Sig  Dispense Refill Last Dose  . cyclobenzaprine (FLEXERIL) 10 MG tablet Take 10 mg by mouth 3 (three) times daily as needed for muscle spasms.   prn  . OVER THE COUNTER MEDICATION Take 1 tablet by mouth daily. Patient is taking a over the counter iron tablet not sure of mg only as needed   prn  . Prenatal Vit-Fe Fumarate-FA (PRENATAL MULTIVITAMIN) TABS tablet Take 2 tablets by mouth 2 (two) times daily.   09/21/2014 at Unknown time  . acetaminophen (TYLENOL) 325 MG tablet Do not take more than 4000 mg of Tylenol (acetaminophen) per day. (Patient not taking: Reported on 09/21/2014)   Not Taking at Unknown time  . albuterol (PROVENTIL HFA;VENTOLIN HFA) 108 (90 BASE) MCG/ACT inhaler Inhale 2 puffs into the lungs every 4 (four) hours as needed for wheezing or shortness of breath. (Patient not taking: Reported on 06/21/2014) 1 Inhaler 1 prn  . guaiFENesin-codeine 100-10 MG/5ML syrup Take 5 mLs by mouth every 6 (six) hours as needed for cough. (Patient not taking: Reported on 06/21/2014) 180 mL 0 Not Taking at Unknown time  . oxyCODONE-acetaminophen (PERCOCET/ROXICET) 5-325 MG per tablet Take 1-2 tablets by mouth every 4 (four) hours as needed for moderate pain. (Patient not taking: Reported on 09/21/2014) 40 tablet 0 Not  Taking at Unknown time  . sertraline (ZOLOFT) 100 MG tablet Take 1 tablet (100 mg total) by mouth daily. (Patient not taking: Reported on 06/21/2014) 30 tablet 3 Not Taking at Unknown time    ROS Pertinent ROS in HPI.  All other systems are negative.   Physical Exam   Blood pressure 140/92, pulse 106, temperature 98 F (36.7 C), temperature source Oral, resp. rate 18, height  (1.549 m), weight 286 lb (129.729 kg), last menstrual period 02/22/2014, SpO2 100 %.  Physical Exam  Constitutional: She is oriented to person, place, and time. She appears well-developed and well-nourished. No distress.  HENT:  Head: Normocephalic and atraumatic.  Eyes: EOM are normal.  Neck: Normal range of motion.   Cardiovascular: Normal rate and normal heart sounds.   Respiratory: No respiratory distress.  GI: Soft. There is no tenderness.  Genitourinary:  FFN collected Spec inserted.  No pooling and no blood.  Vagina with small amt of white discharge.  Cervix closed  Musculoskeletal: Normal range of motion.  Neurological: She is alert and oriented to person, place, and time.  Skin: Skin is warm and dry.  Psychiatric: She has a normal mood and affect.    MAU Course  Procedures  MDM Discussed with Dr. Marlow Baars.  No need to send FFN.  Pt to have continuous monitoring for good strip and then may be discharged to home with f/u in clinic.    Assessment and Plan  A:  1. Vaginal bleeding during pregnancy, antepartum     P: Discharge to home Pelvic rest  F/u in clinic as scheduled/ as needed Patient may return to MAU as needed or if her condition were to change or worsen   Bertram Denver 09/21/2014, 11:16 AM

## 2014-09-24 LAB — OB RESULTS CONSOLE HGB/HCT, BLOOD
HEMATOCRIT: 32 %
HEMOGLOBIN: 10.2 g/dL

## 2014-09-25 ENCOUNTER — Encounter (HOSPITAL_COMMUNITY): Payer: Self-pay | Admitting: *Deleted

## 2014-10-17 ENCOUNTER — Encounter (HOSPITAL_COMMUNITY): Payer: Self-pay | Admitting: *Deleted

## 2014-10-17 ENCOUNTER — Inpatient Hospital Stay (HOSPITAL_COMMUNITY)
Admission: AD | Admit: 2014-10-17 | Discharge: 2014-10-17 | Disposition: A | Discharge status: Home / Self Care | Payer: Medicaid Other | Source: Ambulatory Visit | Attending: Obstetrics and Gynecology | Admitting: Obstetrics and Gynecology

## 2014-10-17 DIAGNOSIS — R030 Elevated blood-pressure reading, without diagnosis of hypertension: Secondary | ICD-10-CM | POA: Diagnosis present

## 2014-10-17 DIAGNOSIS — Z3A29 29 weeks gestation of pregnancy: Secondary | ICD-10-CM | POA: Diagnosis not present

## 2014-10-17 DIAGNOSIS — O133 Gestational [pregnancy-induced] hypertension without significant proteinuria, third trimester: Secondary | ICD-10-CM

## 2014-10-17 DIAGNOSIS — O163 Unspecified maternal hypertension, third trimester: Secondary | ICD-10-CM

## 2014-10-17 DIAGNOSIS — O26893 Other specified pregnancy related conditions, third trimester: Secondary | ICD-10-CM | POA: Diagnosis not present

## 2014-10-17 LAB — URIC ACID: Uric Acid, Serum: 3.8 mg/dL (ref 2.3–6.6)

## 2014-10-17 LAB — COMPREHENSIVE METABOLIC PANEL
ALBUMIN: 2.8 g/dL — AB (ref 3.5–5.0)
ALK PHOS: 80 U/L (ref 38–126)
ALT: 10 U/L — ABNORMAL LOW (ref 14–54)
ANION GAP: 10 (ref 5–15)
AST: 13 U/L — AB (ref 15–41)
CALCIUM: 8.5 mg/dL — AB (ref 8.9–10.3)
CO2: 20 mmol/L — ABNORMAL LOW (ref 22–32)
Chloride: 105 mmol/L (ref 101–111)
Creatinine, Ser: 0.48 mg/dL (ref 0.44–1.00)
GFR calc Af Amer: >60 mL/min (ref >60)
GFR calc non Af Amer: >60 mL/min (ref >60)
GLUCOSE: 172 mg/dL — AB (ref 65–99)
Potassium: 3.4 mmol/L — ABNORMAL LOW (ref 3.5–5.1)
Sodium: 135 mmol/L (ref 135–145)
TOTAL PROTEIN: 6.6 g/dL (ref 6.5–8.1)
Total Bilirubin: 0.5 mg/dL (ref 0.3–1.2)

## 2014-10-17 LAB — URINE MICROSCOPIC-ADD ON

## 2014-10-17 LAB — CBC
HEMATOCRIT: 27.9 % — AB (ref 36.0–46.0)
HEMOGLOBIN: 8.9 g/dL — AB (ref 12.0–15.0)
MCH: 25.4 pg — ABNORMAL LOW (ref 26.0–34.0)
MCHC: 31.9 g/dL (ref 30.0–36.0)
MCV: 79.5 fL (ref 78.0–100.0)
Platelets: 216 10*3/uL (ref 150–400)
RBC: 3.51 MIL/uL — ABNORMAL LOW (ref 3.87–5.11)
RDW: 15.2 % (ref 11.5–15.5)
WBC: 8.7 10*3/uL (ref 4.0–10.5)

## 2014-10-17 LAB — URINALYSIS, ROUTINE W REFLEX MICROSCOPIC
Bilirubin Urine: NEGATIVE
Glucose, UA: NEGATIVE mg/dL
Ketones, ur: 15 mg/dL — AB
LEUKOCYTES UA: NEGATIVE
Nitrite: NEGATIVE
PROTEIN: NEGATIVE mg/dL
Specific Gravity, Urine: 1.025 (ref 1.005–1.030)
Urobilinogen, UA: 0.2 mg/dL (ref 0.0–1.0)
pH: 6 (ref 5.0–8.0)

## 2014-10-17 LAB — LACTATE DEHYDROGENASE: LDH: 99 U/L (ref 98–192)

## 2014-10-17 LAB — PROTEIN / CREATININE RATIO, URINE
CREATININE, URINE: 180 mg/dL
Protein Creatinine Ratio: 0.12 mg/mg{Cre} (ref 0.00–0.15)
Total Protein, Urine: 22 mg/dL

## 2014-10-17 NOTE — MAU Provider Note (Signed)
History     CSN: [id number]  Arrival date and time: 10/17/14 1115   First Provider Initiated Contact with Patient 10/17/14 1142         Chief Complaint  Patient presents with  . Hypertension   HPI  Marilyn Cooper is a 24 y.o. [id number] at [redacted]w[redacted]d who presents sent from office for BP eval.  Was seen in office today for routine visit & had elevated BP.  History of PIH with previous pregnancy.  Denies headache, vision changes, or epigastric pain.  Denies contractions, vaginal bleeding, or LOF.  Positive fetal movement.  Denies chest pain or SOB.     OB History    Gravida Para Term Preterm AB TAB SAB Ectopic Multiple Living   Past Medical History  Diagnosis Date  . Pregnancy induced hypertension   . Anemia   . Obese   . Hx of varicella   . Asthma     childhood    Past Surgical History  Procedure Laterality Date  . Tonsillectomy    . Cesarean section N/A 03/09/2012    Procedure: CESAREAN SECTION;  Surgeon: Freddrick March. Tenny Craw, MD;  Location: [facility];  Service: Obstetrics;  Laterality: N/A;  . Umbilical hernia repair N/A 06/21/2014    Procedure: EXPLORATORY LAPAROTOMY,  SMALL BOWEL RESECTION, PRIMARY REPAIR INCARCERATED VENTRAL HERNIA;  Surgeon: Darnell Level, MD;  Location: [facility];  Service: General;  Laterality: N/A;    Family History  Problem Relation Age of Onset  . Anemia Mother   . Anemia Sister   . Birth defects Brother     heart disorder?  Marland Kitchen Hypertension Maternal Grandmother   . Diabetes Maternal Grandmother   . Hypertension Maternal Grandfather   . Diabetes Maternal Grandfather   . Hypertension Paternal Grandmother   . Hypertension Paternal Grandfather     Social History  Substance Use Topics  . Smoking status: Never Smoker   . Smokeless tobacco: Never Used  . Alcohol Use: No    Allergies: No Known Allergies  Prescriptions prior to admission  Medication Sig Dispense Refill Last Dose  . albuterol (PROVENTIL HFA;VENTOLIN HFA) 108  (90 BASE) MCG/ACT inhaler Inhale 2 puffs into the lungs every 4 (four) hours as needed for wheezing or shortness of breath. (Patient not taking: Reported on 06/21/2014) 1 Inhaler 1 prn  . cyclobenzaprine (FLEXERIL) 10 MG tablet Take 10 mg by mouth 3 (three) times daily as needed for muscle spasms.   prn  . OVER THE COUNTER MEDICATION Take 1 tablet by mouth daily. Patient is taking a over the counter iron tablet not sure of mg only as needed   prn  . Prenatal Vit-Fe Fumarate-FA (PRENATAL MULTIVITAMIN) TABS tablet Take 2 tablets by mouth 2 (two) times daily.   09/21/2014 at Unknown time    Review of Systems  Constitutional: Negative.   HENT: Negative.   Eyes: Negative for blurred vision.  Respiratory: Negative.   Cardiovascular: Negative.   Gastrointestinal: Negative.   Genitourinary: Negative.   Neurological: Negative for dizziness and headaches.   Physical Exam   Blood pressure 136/80, pulse 115, last menstrual period 02/22/2014.  Patient Vitals for the past 24 hrs:  BP Temp Temp src Pulse Resp  10/17/14 1221 130/77 mmHg - - 109 -  10/17/14 1211 131/77 mmHg - - 105 -  10/17/14 1201 152/78 mmHg - - 117 -  10/17/14 1151 164/91 mmHg - - 116 -  10/17/14 1141 157/92 mmHg - - 114 -  10/17/14 1131 136/80 mmHg - - 115 -  10/17/14 1129 149/96 mmHg 98.7 F (37.1 C) Oral 116 20     Physical Exam  Nursing note and vitals reviewed. Constitutional: She is oriented to person, place, and time. She appears well-developed and well-nourished. No distress.  HENT:  Head: Normocephalic and atraumatic.  Eyes: Conjunctivae are normal. Right eye exhibits no discharge. Left eye exhibits no discharge. No scleral icterus.  Neck: Normal range of motion.  Cardiovascular: Normal rate, regular rhythm and normal heart sounds.   No murmur heard. Respiratory: Effort normal and breath sounds normal. No respiratory distress. She has no wheezes.  GI: Soft.  Musculoskeletal: She exhibits no edema.  Neurological:  She is alert and oriented to person, place, and time. She has normal reflexes.  No clonus  Skin: Skin is warm and dry. She is not diaphoretic.  Psychiatric: She has a normal mood and affect. Her behavior is normal. Judgment and thought content normal.   Fetal Tracing:  Baseline: 145 Variability: moderate Accelerations: 10x10 & 15x15 Decelerations: none  Toco: none   MAU Course  Procedures Results for orders placed or performed during the hospital encounter of 10/17/14 (from the past 24 hour(s))  Urinalysis, Routine w reflex microscopic (not at [facility])     Status: Abnormal   Collection Time: 10/17/14 11:24 AM  Result Value Ref Range   Color, Urine YELLOW YELLOW   APPearance CLOUDY (A) CLEAR   Specific Gravity, Urine 1.025 1.005 - 1.030   pH 6.0 5.0 - 8.0   Glucose, UA NEGATIVE NEGATIVE mg/dL   Hgb urine dipstick TRACE (A) NEGATIVE   Bilirubin Urine NEGATIVE NEGATIVE   Ketones, ur 15 (A) NEGATIVE mg/dL   Protein, ur NEGATIVE NEGATIVE mg/dL   Urobilinogen, UA 0.2 0.0 - 1.0 mg/dL   Nitrite NEGATIVE NEGATIVE   Leukocytes, UA NEGATIVE NEGATIVE  Protein / creatinine ratio, urine     Status: None   Collection Time: 10/17/14 11:24 AM  Result Value Ref Range   Creatinine, Urine 180.00 mg/dL   Total Protein, Urine 22 mg/dL   Protein Creatinine Ratio 0.12 0.00 - 0.15 mg/mg[Cre]  Urine microscopic-add on     Status: Abnormal   Collection Time: 10/17/14 11:24 AM  Result Value Ref Range   Squamous Epithelial / LPF FEW (A) RARE   WBC, UA 0-2 <3 WBC/hpf   Bacteria, UA FEW (A) RARE  CBC     Status: Abnormal   Collection Time: 10/17/14 11:48 AM  Result Value Ref Range   WBC 8.7 4.0 - 10.5 K/uL   RBC 3.51 (L) 3.87 - 5.11 MIL/uL   Hemoglobin 8.9 (L) 12.0 - 15.0 g/dL   HCT 96.2 (L) 95.2 - 84.1 %   MCV 79.5 78.0 - 100.0 fL   MCH 25.4 (L) 26.0 - 34.0 pg   MCHC 31.9 30.0 - 36.0 g/dL   RDW 32.4 40.1 - 02.7 %   Platelets 216 150 - 400 K/uL  Comprehensive metabolic panel     Status:  Abnormal   Collection Time: 10/17/14 11:48 AM  Result Value Ref Range   Sodium 135 135 - 145 mmol/L   Potassium 3.4 (L) 3.5 - 5.1 mmol/L   Chloride 105 101 - 111 mmol/L   CO2 20 (L) 22 - 32 mmol/L   Glucose, Bld 172 (H) 65 - 99 mg/dL   BUN <5 (L) 6 - 20 mg/dL   Creatinine, Ser 2.53 0.44 - 1.00 mg/dL  Calcium 8.5 (L) 8.9 - 10.3 mg/dL   Total Protein 6.6 6.5 - 8.1 g/dL   Albumin 2.8 (L) 3.5 - 5.0 g/dL   AST 13 (L) 15 - 41 U/L   ALT 10 (L) 14 - 54 U/L   Alkaline Phosphatase 80 38 - 126 U/L   Total Bilirubin 0.5 0.3 - 1.2 mg/dL   GFR calc non Af Amer >60 >60 mL/min   GFR calc Af Amer >60 >60 mL/min   Anion gap 10 5 - 15  Lactate dehydrogenase     Status: None   Collection Time: 10/17/14 11:48 AM  Result Value Ref Range   LDH 99 98 - 192 U/L  Uric acid     Status: None   Collection Time: 10/17/14 11:48 AM  Result Value Ref Range   Uric Acid, Serum 3.8 2.3 - 6.6 mg/dL    MDM Category 1 tracing, no contractions 1320- C/w Dr. Tenny Craw. Reviewed labs & BP. Pt to go to office on Monday for BP check  Assessment and Plan  A: 1. Elevated blood pressure affecting pregnancy in third trimester, antepartum    P: Discharge home Go to office Monday morning for BP check Preeclampsia precautions & reasons to return to MAU  Judeth Horn, NP  10/17/2014, 11:38 AM

## 2014-10-17 NOTE — MAU Note (Signed)
Sent from office for further evaluation of elevated BP 

## 2014-10-17 NOTE — Discharge Instructions (Signed)

## 2014-11-05 ENCOUNTER — Other Ambulatory Visit: Payer: Self-pay | Admitting: Obstetrics and Gynecology

## 2014-11-20 ENCOUNTER — Inpatient Hospital Stay (HOSPITAL_COMMUNITY)
Admission: AD | Admit: 2014-11-20 | Discharge: 2014-11-20 | Disposition: A | Discharge status: Home / Self Care | Payer: 59 | Source: Ambulatory Visit | Attending: Obstetrics and Gynecology | Admitting: Obstetrics and Gynecology

## 2014-11-20 ENCOUNTER — Encounter (HOSPITAL_COMMUNITY): Payer: Self-pay

## 2014-11-20 ENCOUNTER — Inpatient Hospital Stay (HOSPITAL_COMMUNITY): Payer: 59

## 2014-11-20 DIAGNOSIS — Z3A34 34 weeks gestation of pregnancy: Secondary | ICD-10-CM | POA: Insufficient documentation

## 2014-11-20 DIAGNOSIS — O133 Gestational [pregnancy-induced] hypertension without significant proteinuria, third trimester: Secondary | ICD-10-CM | POA: Diagnosis not present

## 2014-11-20 DIAGNOSIS — G44209 Tension-type headache, unspecified, not intractable: Secondary | ICD-10-CM

## 2014-11-20 DIAGNOSIS — R51 Headache: Secondary | ICD-10-CM | POA: Insufficient documentation

## 2014-11-20 DIAGNOSIS — O288 Other abnormal findings on antenatal screening of mother: Secondary | ICD-10-CM

## 2014-11-20 LAB — COMPREHENSIVE METABOLIC PANEL
ALT: 11 U/L — ABNORMAL LOW (ref 14–54)
ANION GAP: 8 (ref 5–15)
AST: 18 U/L (ref 15–41)
Albumin: 2.7 g/dL — ABNORMAL LOW (ref 3.5–5.0)
Alkaline Phosphatase: 99 U/L (ref 38–126)
BUN: 7 mg/dL (ref 6–20)
CHLORIDE: 108 mmol/L (ref 101–111)
CO2: 19 mmol/L — ABNORMAL LOW (ref 22–32)
CREATININE: 0.33 mg/dL — AB (ref 0.44–1.00)
Calcium: 8.9 mg/dL (ref 8.9–10.3)
GLUCOSE: 118 mg/dL — AB (ref 65–99)
POTASSIUM: 4.1 mmol/L (ref 3.5–5.1)
Sodium: 135 mmol/L (ref 135–145)
Total Bilirubin: 0.3 mg/dL (ref 0.3–1.2)
Total Protein: 6 g/dL — ABNORMAL LOW (ref 6.5–8.1)

## 2014-11-20 LAB — CBC
HCT: 28.1 % — ABNORMAL LOW (ref 36.0–46.0)
Hemoglobin: 9.2 g/dL — ABNORMAL LOW (ref 12.0–15.0)
MCH: 25.4 pg — AB (ref 26.0–34.0)
MCHC: 32.7 g/dL (ref 30.0–36.0)
MCV: 77.6 fL — AB (ref 78.0–100.0)
PLATELETS: 207 10*3/uL (ref 150–400)
RBC: 3.62 MIL/uL — ABNORMAL LOW (ref 3.87–5.11)
RDW: 15.9 % — AB (ref 11.5–15.5)
WBC: 8.9 10*3/uL (ref 4.0–10.5)

## 2014-11-20 LAB — URINALYSIS, ROUTINE W REFLEX MICROSCOPIC
BILIRUBIN URINE: NEGATIVE
Glucose, UA: NEGATIVE mg/dL
HGB URINE DIPSTICK: NEGATIVE
Ketones, ur: NEGATIVE mg/dL
NITRITE: NEGATIVE
PROTEIN: NEGATIVE mg/dL
Specific Gravity, Urine: 1.015 (ref 1.005–1.030)
UROBILINOGEN UA: 0.2 mg/dL (ref 0.0–1.0)
pH: 7 (ref 5.0–8.0)

## 2014-11-20 LAB — PROTEIN / CREATININE RATIO, URINE
CREATININE, URINE: 70 mg/dL
Protein Creatinine Ratio: 0.23 mg/mg{Cre} — ABNORMAL HIGH (ref 0.00–0.15)
Total Protein, Urine: 16 mg/dL

## 2014-11-20 LAB — URINE MICROSCOPIC-ADD ON

## 2014-11-20 MED ORDER — LACTATED RINGERS IV BOLUS (SEPSIS)
1000.0000 mL | Freq: Once | INTRAVENOUS | Status: AC
Start: 2014-11-20 — End: 2014-11-20
  Administered 2014-11-20: 1000 mL via INTRAVENOUS

## 2014-11-20 MED ORDER — BUTALBITAL-APAP-CAFFEINE 50-325-40 MG PO TABS
2.0000 | ORAL_TABLET | Freq: Once | ORAL | Status: AC
  Administered 2014-11-20: 2 via ORAL
  Filled 2014-11-20: qty 2

## 2014-11-20 NOTE — MAU Provider Note (Signed)
History     CSN: [id number]  Arrival date and time: 11/20/14 [id number]   First Provider Initiated Contact with Patient 11/20/14 0251      Chief Complaint  Patient presents with  . Headache   HPI Comments: Marilyn Cooper is a 24 y.o. [id number] at 2373w3d who presents today with a headache. She states that the headache started around 1300 yesterday. She states that she tried allergy medicine and tylenol, but they did not help. She is on 300 mg labetalol BID for elevated blood pressure in the pregnancy. She states that often when she gets a headache she takes the blood pressure medication, and then the headache goes away. She denies any contractions, VB or LOF. She states that the fetus has been moving normally.   Headache  This is a new problem. The current episode started yesterday. The problem occurs constantly. The problem has been unchanged. The pain is located in the frontal region. The pain does not radiate. The pain quality is not similar to prior headaches. The quality of the pain is described as throbbing. The pain is at a severity of 7/10. Associated symptoms include photophobia. Pertinent negatives include no abdominal pain, blurred vision, dizziness, nausea, visual change or vomiting. Nothing aggravates the symptoms. She has tried acetaminophen (zyrtec ) for the symptoms. The treatment provided no relief.    Past Medical History  Diagnosis Date  . Pregnancy induced hypertension   . Anemia   . Obese   . Hx of varicella   . Asthma     childhood    Past Surgical History  Procedure Laterality Date  . Tonsillectomy    . Cesarean section N/A 03/09/2012    Procedure: CESAREAN SECTION;  Surgeon: Freddrick MarchKendra H. Tenny Crawoss, MD;  Location: [facility];  Service: Obstetrics;  Laterality: N/A;  . Umbilical hernia repair N/A 06/21/2014    Procedure: EXPLORATORY LAPAROTOMY,  SMALL BOWEL RESECTION, PRIMARY REPAIR INCARCERATED VENTRAL HERNIA;  Surgeon: Darnell Levelodd Gerkin, MD;  Location: [facility];  Service: General;   Laterality: N/A;    Family History  Problem Relation Age of Onset  . Anemia Mother   . Anemia Sister   . Birth defects Brother     heart disorder?  Marland Kitchen. Hypertension Maternal Grandmother   . Diabetes Maternal Grandmother   . Hypertension Maternal Grandfather   . Diabetes Maternal Grandfather   . Hypertension Paternal Grandmother   . Hypertension Paternal Grandfather     Social History  Substance Use Topics  . Smoking status: Never Smoker   . Smokeless tobacco: Never Used  . Alcohol Use: No    Allergies: No Known Allergies  Prescriptions prior to admission  Medication Sig Dispense Refill Last Dose  . IRON PO Take 2 tablets by mouth at bedtime.   11/19/2014 at Unknown time  . labetalol (NORMODYNE) 300 MG tablet Take 300 mg by mouth 2 (two) times daily.   11/19/2014 at 2030  . Prenatal Vit-Min-FA-Fish Oil ([facility] PRENATAL GUMMY PO) Take 4 each by mouth daily.   11/19/2014 at Unknown time  . sertraline (ZOLOFT) 50 MG tablet Take 50 mg by mouth daily.   11/19/2014 at 0900  . albuterol (PROVENTIL HFA;VENTOLIN HFA) 108 (90 BASE) MCG/ACT inhaler Inhale 2 puffs into the lungs every 4 (four) hours as needed for wheezing or shortness of breath. 1 Inhaler 1 More than a month at Unknown time    Review of Systems  Eyes: Positive for photophobia. Negative for blurred vision.  Cardiovascular: Negative for chest pain.  Gastrointestinal: Negative for nausea, vomiting, abdominal pain, diarrhea and constipation.  Neurological: Positive for headaches. Negative for dizziness.   Physical Exam   Blood pressure 127/59, pulse 108, temperature 98 F (36.7 C), temperature source Oral, resp. rate 18, height  (1.549 m), weight 133.358 kg (294 lb), last menstrual period 02/22/2014, SpO2 97 %.  Physical Exam  Nursing note and vitals reviewed. Constitutional: She is oriented to person, place, and time. She appears well-developed and well-nourished. No distress.  HENT:  Head: Normocephalic.   Cardiovascular: Normal rate.   Respiratory: Effort normal.  GI: Soft. There is no tenderness.  Neurological: She is alert and oriented to person, place, and time. She has normal reflexes.  1 beat of clonus in each leg.   Skin: Skin is warm and dry.  Psychiatric: She has a normal mood and affect.   FHT: 145, moderate with no accels, no decels - reassuring  Toco: no UCs   Results for orders placed or performed during the hospital encounter of 11/20/14 (from the past 24 hour(s))  Urinalysis, Routine w reflex microscopic (not at [facility])     Status: Abnormal   Collection Time: 11/20/14  2:25 AM  Result Value Ref Range   Color, Urine YELLOW YELLOW   APPearance CLEAR CLEAR   Specific Gravity, Urine 1.015 1.005 - 1.030   pH 7.0 5.0 - 8.0   Glucose, UA NEGATIVE NEGATIVE mg/dL   Hgb urine dipstick NEGATIVE NEGATIVE   Bilirubin Urine NEGATIVE NEGATIVE   Ketones, ur NEGATIVE NEGATIVE mg/dL   Protein, ur NEGATIVE NEGATIVE mg/dL   Urobilinogen, UA 0.2 0.0 - 1.0 mg/dL   Nitrite NEGATIVE NEGATIVE   Leukocytes, UA SMALL (A) NEGATIVE  Urine microscopic-add on     Status: None   Collection Time: 11/20/14  2:25 AM  Result Value Ref Range   Squamous Epithelial / LPF RARE RARE   WBC, UA 3-6 <3 WBC/hpf   Bacteria, UA RARE RARE  Protein / creatinine ratio, urine     Status: Abnormal   Collection Time: 11/20/14  2:25 AM  Result Value Ref Range   Creatinine, Urine 70.00 mg/dL   Total Protein, Urine 16 mg/dL   Protein Creatinine Ratio 0.23 (H) 0.00 - 0.15 mg/mg[Cre]  CBC     Status: Abnormal   Collection Time: 11/20/14  3:20 AM  Result Value Ref Range   WBC 8.9 4.0 - 10.5 K/uL   RBC 3.62 (L) 3.87 - 5.11 MIL/uL   Hemoglobin 9.2 (L) 12.0 - 15.0 g/dL   HCT 16.1 (L) 09.6 - 04.5 %   MCV 77.6 (L) 78.0 - 100.0 fL   MCH 25.4 (L) 26.0 - 34.0 pg   MCHC 32.7 30.0 - 36.0 g/dL   RDW 40.9 (H) 81.1 - 91.4 %   Platelets 207 150 - 400 K/uL  Comprehensive metabolic panel     Status: Abnormal   Collection  Time: 11/20/14  3:20 AM  Result Value Ref Range   Sodium 135 135 - 145 mmol/L   Potassium 4.1 3.5 - 5.1 mmol/L   Chloride 108 101 - 111 mmol/L   CO2 19 (L) 22 - 32 mmol/L   Glucose, Bld 118 (H) 65 - 99 mg/dL   BUN 7 6 - 20 mg/dL   Creatinine, Ser 7.82 (L) 0.44 - 1.00 mg/dL   Calcium 8.9 8.9 - 95.6 mg/dL   Total Protein 6.0 (L) 6.5 - 8.1 g/dL   Albumin 2.7 (L) 3.5 - 5.0 g/dL   AST 18 15 -  41 U/L   ALT 11 (L) 14 - 54 U/L   Alkaline Phosphatase 99 38 - 126 U/L   Total Bilirubin 0.3 0.3 - 1.2 mg/dL   GFR calc non Af Amer >60 >60 mL/min   GFR calc Af Amer >60 >60 mL/min   Anion gap 8 5 - 15   BPP: 8/8 AFI 16.9 62%tile MAU Course  Procedures  MDM 0308: D/W Dr. Claiborne Billings. Patient may have fioricet and LR bolus for headache. Will get pre-eclampsia labs. If normal patient may be DC home. Consider BPP if NST does not become reactive.   0530: Patient reports that headache has improved some with the Fioricet. Reviewed labs and [place]. Will DC home.   Assessment and Plan   1. Tension-type headache, not intractable, unspecified chronicity pattern   2. Non-reactive NST (non-stress test)   3. Gestational hypertension w/o significant proteinuria in 3rd trimester    DC home Comfort measures reviewed  3rd Trimester precautions  Pre-eclampsia warning signs  PTL precautions  Fetal kick counts RX: none, continue labetalol as prescribed  Return to MAU as needed FU with OB as planned  Follow-up Information    Follow up with HORVATH,MICHELLE A, MD.   Specialty:  Obstetrics and Gynecology   Why:  As scheduled   Contact information:   [address] [place] [address] [phone]         Tawnya Crook 11/20/2014, 2:52 AM

## 2014-11-20 NOTE — MAU Note (Signed)
Pt states she has had a headache for the last 12 hours, has taken tylenol, her B/P meds, and some allergy meds to try and make it go away.

## 2014-11-20 NOTE — Discharge Instructions (Signed)

## 2014-11-25 ENCOUNTER — Encounter (HOSPITAL_COMMUNITY): Payer: Self-pay

## 2014-11-25 ENCOUNTER — Inpatient Hospital Stay (HOSPITAL_COMMUNITY)
Admission: AD | Admit: 2014-11-25 | Discharge: 2014-11-27 | DRG: 782 | Disposition: A | Discharge status: Home / Self Care | Payer: 59 | Source: Ambulatory Visit | Attending: Obstetrics | Admitting: Obstetrics

## 2014-11-25 ENCOUNTER — Inpatient Hospital Stay (HOSPITAL_COMMUNITY): Payer: 59

## 2014-11-25 DIAGNOSIS — Z3A35 35 weeks gestation of pregnancy: Secondary | ICD-10-CM

## 2014-11-25 DIAGNOSIS — IMO0002 Reserved for concepts with insufficient information to code with codable children: Secondary | ICD-10-CM

## 2014-11-25 DIAGNOSIS — O34219 Maternal care for unspecified type scar from previous cesarean delivery: Secondary | ICD-10-CM

## 2014-11-25 DIAGNOSIS — O368390 Maternal care for abnormalities of the fetal heart rate or rhythm, unspecified trimester, not applicable or unspecified: Secondary | ICD-10-CM

## 2014-11-25 DIAGNOSIS — O99213 Obesity complicating pregnancy, third trimester: Secondary | ICD-10-CM

## 2014-11-25 DIAGNOSIS — O133 Gestational [pregnancy-induced] hypertension without significant proteinuria, third trimester: Principal | ICD-10-CM | POA: Diagnosis present

## 2014-11-25 DIAGNOSIS — O1493 Unspecified pre-eclampsia, third trimester: Secondary | ICD-10-CM

## 2014-11-25 DIAGNOSIS — O139 Gestational [pregnancy-induced] hypertension without significant proteinuria, unspecified trimester: Secondary | ICD-10-CM

## 2014-11-25 LAB — COMPREHENSIVE METABOLIC PANEL
ALBUMIN: 2.8 g/dL — AB (ref 3.5–5.0)
ALK PHOS: 120 U/L (ref 38–126)
ALT: 10 U/L — ABNORMAL LOW (ref 14–54)
ANION GAP: 9 (ref 5–15)
AST: 16 U/L (ref 15–41)
BUN: 6 mg/dL (ref 6–20)
CALCIUM: 9.2 mg/dL (ref 8.9–10.3)
CO2: 20 mmol/L — ABNORMAL LOW (ref 22–32)
Chloride: 106 mmol/L (ref 101–111)
Creatinine, Ser: 0.4 mg/dL — ABNORMAL LOW (ref 0.44–1.00)
Glucose, Bld: 90 mg/dL (ref 65–99)
POTASSIUM: 4.3 mmol/L (ref 3.5–5.1)
Sodium: 135 mmol/L (ref 135–145)
TOTAL PROTEIN: 6.5 g/dL (ref 6.5–8.1)

## 2014-11-25 LAB — CBC
HEMATOCRIT: 31.2 % — AB (ref 36.0–46.0)
HEMOGLOBIN: 10 g/dL — AB (ref 12.0–15.0)
MCH: 25 pg — AB (ref 26.0–34.0)
MCHC: 32.1 g/dL (ref 30.0–36.0)
MCV: 78 fL (ref 78.0–100.0)
Platelets: 255 10*3/uL (ref 150–400)
RBC: 4 MIL/uL (ref 3.87–5.11)
RDW: 15.6 % — AB (ref 11.5–15.5)
WBC: 9.2 10*3/uL (ref 4.0–10.5)

## 2014-11-25 LAB — URINALYSIS, ROUTINE W REFLEX MICROSCOPIC
Bilirubin Urine: NEGATIVE
GLUCOSE, UA: NEGATIVE mg/dL
HGB URINE DIPSTICK: NEGATIVE
Ketones, ur: NEGATIVE mg/dL
Nitrite: NEGATIVE
PH: 6 (ref 5.0–8.0)
PROTEIN: NEGATIVE mg/dL
Specific Gravity, Urine: 1.025 (ref 1.005–1.030)
Urobilinogen, UA: 0.2 mg/dL (ref 0.0–1.0)

## 2014-11-25 LAB — LACTATE DEHYDROGENASE: LDH: 117 U/L (ref 98–192)

## 2014-11-25 LAB — PROTEIN / CREATININE RATIO, URINE
CREATININE, URINE: 143 mg/dL
PROTEIN CREATININE RATIO: 0.37 mg/mg{creat} — AB (ref 0.00–0.15)
TOTAL PROTEIN, URINE: 53 mg/dL

## 2014-11-25 LAB — URINE MICROSCOPIC-ADD ON

## 2014-11-25 LAB — URIC ACID: Uric Acid, Serum: 3.8 mg/dL (ref 2.3–6.6)

## 2014-11-25 LAB — TYPE AND SCREEN
ABO/RH(D): A POS
ANTIBODY SCREEN: NEGATIVE

## 2014-11-25 MED ORDER — FERROUS SULFATE 325 (65 FE) MG PO TABS
325.0000 mg | ORAL_TABLET | Freq: Three times a day (TID) | ORAL | Status: DC
  Administered 2014-11-26 – 2014-11-27 (×4): 325 mg via ORAL
  Filled 2014-11-25 (×4): qty 1

## 2014-11-25 MED ORDER — LABETALOL HCL 5 MG/ML IV SOLN
20.0000 mg | INTRAVENOUS | Status: DC | PRN
  Administered 2014-11-25: 20 mg via INTRAVENOUS
  Filled 2014-11-25: qty 4

## 2014-11-25 MED ORDER — HYDRALAZINE HCL 20 MG/ML IJ SOLN: 10.0000 mg | Freq: Once | INTRAMUSCULAR | Status: DC | PRN

## 2014-11-25 MED ORDER — BETAMETHASONE SOD PHOS & ACET 6 (3-3) MG/ML IJ SUSP
12.0000 mg | INTRAMUSCULAR | Status: AC
  Administered 2014-11-25 – 2014-11-26 (×2): 12 mg via INTRAMUSCULAR
  Filled 2014-11-25 (×2): qty 2

## 2014-11-25 MED ORDER — LACTATED RINGERS IV SOLN
INTRAVENOUS | Status: DC
  Administered 2014-11-26 – 2014-11-27 (×2): via INTRAVENOUS

## 2014-11-25 MED ORDER — SERTRALINE HCL 50 MG PO TABS
50.0000 mg | ORAL_TABLET | Freq: Every day | ORAL | Status: DC
  Administered 2014-11-26 – 2014-11-27 (×2): 50 mg via ORAL
  Filled 2014-11-25 (×2): qty 1

## 2014-11-25 MED ORDER — CEFAZOLIN SODIUM 10 G IJ SOLR: 3.0000 g | INTRAMUSCULAR | Status: DC

## 2014-11-25 MED ORDER — LABETALOL HCL 300 MG PO TABS
300.0000 mg | ORAL_TABLET | Freq: Two times a day (BID) | ORAL | Status: DC
  Administered 2014-11-25 – 2014-11-27 (×4): 300 mg via ORAL
  Filled 2014-11-25 (×4): qty 1

## 2014-11-25 NOTE — H&P (Signed)
24 y.o. [redacted]w[redacted]d  [id number] comes in to office today for routine visit and found to have BPs 172/96 and 178/104.  Otherwise has good fetal movement and no bleeding.  Pt had HA this morning which went away with Fiorcet.  She also had an episode of blurry vision that lasted only a few seconds.  Pt sent directly to MAU for evaluation.  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  She was started on Labetalol then and only had an occ BP of 150/90.  She had not had any signs of preeclampsia.  She has had some headaches but these have gone away with fiorcet.    Pt had bowel resection at about 11 weeks for incarceration in an umbilical hernia.    Past Medical History  Diagnosis Date  . Pregnancy induced hypertension   . Anemia   . Obese   . Hx of varicella   . Asthma     childhood    Past Surgical History  Procedure Laterality Date  . Tonsillectomy    . Cesarean section N/A 03/09/2012    Procedure: CESAREAN SECTION;  Surgeon: Freddrick March. Tenny Craw, MD;  Location: [facility];  Service: Obstetrics;  Laterality: N/A;  . Umbilical hernia repair N/A 06/21/2014    Procedure: EXPLORATORY LAPAROTOMY,  SMALL BOWEL RESECTION, PRIMARY REPAIR INCARCERATED VENTRAL HERNIA;  Surgeon: Darnell Level, MD;  Location: [facility];  Service: General;  Laterality: N/A;    OB History  Gravida Para Term Preterm AB SAB TAB Ectopic Multiple Living  # Outcome Date GA Lbr Len/2nd Weight Sex Delivery Anes PTL Lv  5 Current           4 Term 03/09/12 [redacted]w[redacted]d   [name] EPI  Y  3 TAB 2013          2 TAB 2012          1 TAB 2011              Social History   Social History  . Marital Status: Married    Spouse Name: N/A  . Number of Children: N/A  . Years of Education: N/A   Occupational History  . Not on file.   Social History Main Topics  . Smoking status: Never Smoker   . Smokeless tobacco: Never Used  . Alcohol Use: No  . Drug Use: No  . Sexual Activity: Yes   Other  Topics Concern  . Not on file   Social History Narrative   Review of patient's allergies indicates no known allergies.    Prenatal Transfer Tool  Maternal Diabetes: No Genetic Screening: Normal Maternal Ultrasounds/Referrals: Normal Fetal Ultrasounds or other Referrals:  None Maternal Substance Abuse:  No Significant Maternal Medications:  None Significant Maternal Lab Results: None  Other PNC: uncomplicated.    Filed Vitals:   11/25/14 1702 11/25/14 1718 11/25/14 1822 11/25/14 1823  BP: 151/90 147/92 157/79   Pulse: 94 89 92   Temp:      TempSrc:      Resp:      Height:     (1.549 m)  Weight:    132.45 kg (292 lb)    Lungs/Cor:  NAD Abdomen:  soft, gravid Ex:  no cords, erythema SVE:  Not checked- pt for repeat c/s. FHTs:  133, good STV, NST not reactive but accels Toco:  qocc  Results  for orders placed or performed during the hospital encounter of 11/25/14 (from the past 24 hour(s))  Urinalysis, Routine w reflex microscopic (not at [facility])     Status: Abnormal   Collection Time: 11/25/14  4:00 PM  Result Value Ref Range   Color, Urine YELLOW YELLOW   APPearance CLEAR CLEAR   Specific Gravity, Urine 1.025 1.005 - 1.030   pH 6.0 5.0 - 8.0   Glucose, UA NEGATIVE NEGATIVE mg/dL   Hgb urine dipstick NEGATIVE NEGATIVE   Bilirubin Urine NEGATIVE NEGATIVE   Ketones, ur NEGATIVE NEGATIVE mg/dL   Protein, ur NEGATIVE NEGATIVE mg/dL   Urobilinogen, UA 0.2 0.0 - 1.0 mg/dL   Nitrite NEGATIVE NEGATIVE   Leukocytes, UA SMALL (A) NEGATIVE  Protein / creatinine ratio, urine     Status: Abnormal   Collection Time: 11/25/14  4:00 PM  Result Value Ref Range   Creatinine, Urine 143.00 mg/dL   Total Protein, Urine 53 mg/dL   Protein Creatinine Ratio 0.37 (H) 0.00 - 0.15 mg/mg[Cre]  Urine microscopic-add on     Status: Abnormal   Collection Time: 11/25/14  4:00 PM  Result Value Ref Range   Squamous Epithelial / LPF FEW (A) RARE   WBC, UA 0-2 <3 WBC/hpf   RBC / HPF 0-2  <3 RBC/hpf   Bacteria, UA RARE RARE   Urine-Other MUCOUS PRESENT   CBC     Status: Abnormal   Collection Time: 11/25/14  4:10 PM  Result Value Ref Range   WBC 9.2 4.0 - 10.5 K/uL   RBC 4.00 3.87 - 5.11 MIL/uL   Hemoglobin 10.0 (L) 12.0 - 15.0 g/dL   HCT 16.131.2 (L) 09.636.0 - 04.546.0 %   MCV 78.0 78.0 - 100.0 fL   MCH 25.0 (L) 26.0 - 34.0 pg   MCHC 32.1 30.0 - 36.0 g/dL   RDW 40.915.6 (H) 81.111.5 - 91.415.5 %   Platelets 255 150 - 400 K/uL  Comprehensive metabolic panel     Status: Abnormal   Collection Time: 11/25/14  4:10 PM  Result Value Ref Range   Sodium 135 135 - 145 mmol/L   Potassium 4.3 3.5 - 5.1 mmol/L   Chloride 106 101 - 111 mmol/L   CO2 20 (L) 22 - 32 mmol/L   Glucose, Bld 90 65 - 99 mg/dL   BUN 6 6 - 20 mg/dL   Creatinine, Ser 7.820.40 (L) 0.44 - 1.00 mg/dL   Calcium 9.2 8.9 - 95.610.3 mg/dL   Total Protein 6.5 6.5 - 8.1 g/dL   Albumin 2.8 (L) 3.5 - 5.0 g/dL   AST 16 15 - 41 U/L   ALT 10 (L) 14 - 54 U/L   Alkaline Phosphatase 120 38 - 126 U/L   Total Bilirubin <0.1 (L) 0.3 - 1.2 mg/dL   GFR calc non Af Amer >60 >60 mL/min   GFR calc Af Amer >60 >60 mL/min   Anion gap 9 5 - 15  Lactate dehydrogenase     Status: None   Collection Time: 11/25/14  4:10 PM  Result Value Ref Range   LDH 117 98 - 192 U/L  Uric acid     Status: None   Collection Time: 11/25/14  4:10 PM  Result Value Ref Range   Uric Acid, Serum 3.8 2.3 - 6.6 mg/dL    A/P   [id number] 7762w1d with PIH with severe BPs responded to IV meds.  BPs were at 15:30 to 16:00- not 4 hours apart yet.  GBS not done yet.  Pt is for repeat C/S if delivery needed.  Will observe for now in house.    Marilyn Cooper A

## 2014-11-25 NOTE — MAU Note (Signed)
Scheduled C/S in 2 weeks.  Pt sent from MD office for elevated BP.  Had HA this a.m., took fioricet which relieved her HA.  Has had some visual spots.  No epigastric pain.  Denies uc's, bleeding or LOF.

## 2014-11-25 NOTE — MAU Provider Note (Signed)
History     CSN: [id number]  Arrival date and time: 11/25/14 1538   First Provider Initiated Contact with Patient 11/25/14 1612       Chief Complaint  Patient presents with  . Hypertension   HPI Marilyn Cooper is a 24 y.o. [id number] at [redacted]w[redacted]d who presents sent from office for elevated BP & headache.  Had headache this morning; treated with Fioricet which relieved symptoms.  Reports blurry vision this morning.  Denies epigastric pain, chest pain, or shortness of breath.  Denies contractions, LOF, or vaginal bleeding.  Positive fetal movement.  Currently taking labetalol 300 mg BID; last took dose this morning.   OB History    Gravida Para Term Preterm AB TAB SAB Ectopic Multiple Living   Past Medical History  Diagnosis Date  . Pregnancy induced hypertension   . Anemia   . Obese   . Hx of varicella   . Asthma     childhood    Past Surgical History  Procedure Laterality Date  . Tonsillectomy    . Cesarean section N/A 03/09/2012    Procedure: CESAREAN SECTION;  Surgeon: Freddrick March. Tenny Craw, MD;  Location: [facility];  Service: Obstetrics;  Laterality: N/A;  . Umbilical hernia repair N/A 06/21/2014    Procedure: EXPLORATORY LAPAROTOMY,  SMALL BOWEL RESECTION, PRIMARY REPAIR INCARCERATED VENTRAL HERNIA;  Surgeon: Darnell Level, MD;  Location: [facility];  Service: General;  Laterality: N/A;    Family History  Problem Relation Age of Onset  . Anemia Mother   . Anemia Sister   . Birth defects Brother     heart disorder?  Marland Kitchen Hypertension Maternal Grandmother   . Diabetes Maternal Grandmother   . Hypertension Maternal Grandfather   . Diabetes Maternal Grandfather   . Hypertension Paternal Grandmother   . Hypertension Paternal Grandfather     Social History  Substance Use Topics  . Smoking status: Never Smoker   . Smokeless tobacco: Never Used  . Alcohol Use: No    Allergies: No Known Allergies  Prescriptions prior to admission  Medication Sig Dispense  Refill Last Dose  . albuterol (PROVENTIL HFA;VENTOLIN HFA) 108 (90 BASE) MCG/ACT inhaler Inhale 2 puffs into the lungs every 4 (four) hours as needed for wheezing or shortness of breath. 1 Inhaler 1 More than a month at Unknown time  . butalbital-acetaminophen-caffeine (FIORICET, ESGIC) 50-325-40 MG tablet Take 1 tablet by mouth every 6 (six) hours as needed for headache.     . IRON PO Take 1 tablet by mouth 3 (three) times daily.    11/19/2014 at Unknown time  . labetalol (NORMODYNE) 300 MG tablet Take 300 mg by mouth 2 (two) times daily.   11/19/2014 at 2030  . Prenatal Vit-Min-FA-Fish Oil ([facility] PRENATAL GUMMY PO) Take 1 each by mouth 2 (two) times daily.    11/19/2014 at Unknown time  . sertraline (ZOLOFT) 50 MG tablet Take 50 mg by mouth daily.   11/19/2014 at 0900    Review of Systems  Constitutional: Negative.   Eyes: Positive for blurred vision (this morning).  Respiratory: Negative.   Cardiovascular: Negative.   Gastrointestinal: Negative.   Genitourinary: Negative.   Neurological: Positive for headaches (relieved with fioricet).   Physical Exam   Blood pressure 164/104, pulse 110, temperature 98 F (36.7 C), temperature source Oral, resp. rate 20, last menstrual period 02/22/2014.   Patient Vitals for the past 24 hrs:  BP Temp Temp src Pulse Resp  11/25/14 1702 151/90 mmHg - - 94 -  11/25/14 1647 157/96 mmHg - - 99 -  11/25/14 1632 159/92 mmHg - - 96 -  11/25/14 1617 (!) 158/101 mmHg - - 102 -  11/25/14 1608 (!) 164/104 mmHg - - 110 -  11/25/14 1554 169/96 mmHg 98 F (36.7 C) Oral 111 20     Physical Exam  Nursing note and vitals reviewed. Constitutional: She is oriented to person, place, and time. She appears well-developed and well-nourished. No distress.  HENT:  Head: Normocephalic and atraumatic.  Eyes: Conjunctivae are normal. Right eye exhibits no discharge. Left eye exhibits no discharge. No scleral icterus.  Neck: Normal range of motion.  Cardiovascular: Normal  rate, regular rhythm and normal heart sounds.   No murmur heard. Respiratory: Effort normal and breath sounds normal. No respiratory distress. She has no wheezes.  GI: Soft.  Musculoskeletal: She exhibits no edema.  Neurological: She is alert and oriented to person, place, and time. She has normal reflexes.  No clonus  Skin: Skin is warm and dry. She is not diaphoretic.  Psychiatric: She has a normal mood and affect. Her behavior is normal. Judgment and thought content normal.   Fetal Tracing:  Baseline: 145 Variability: moderate Accelerations: 15x15 (1), & 10x10 Decelerations: questionable variable deceleration  Toco: none   MAU Course  Procedures Results for orders placed or performed during the hospital encounter of 11/25/14 (from the past 24 hour(s))  Urinalysis, Routine w reflex microscopic (not at [facility])     Status: Abnormal   Collection Time: 11/25/14  4:00 PM  Result Value Ref Range   Color, Urine YELLOW YELLOW   APPearance CLEAR CLEAR   Specific Gravity, Urine 1.025 1.005 - 1.030   pH 6.0 5.0 - 8.0   Glucose, UA NEGATIVE NEGATIVE mg/dL   Hgb urine dipstick NEGATIVE NEGATIVE   Bilirubin Urine NEGATIVE NEGATIVE   Ketones, ur NEGATIVE NEGATIVE mg/dL   Protein, ur NEGATIVE NEGATIVE mg/dL   Urobilinogen, UA 0.2 0.0 - 1.0 mg/dL   Nitrite NEGATIVE NEGATIVE   Leukocytes, UA SMALL (A) NEGATIVE  Protein / creatinine ratio, urine     Status: Abnormal   Collection Time: 11/25/14  4:00 PM  Result Value Ref Range   Creatinine, Urine 143.00 mg/dL   Total Protein, Urine 53 mg/dL   Protein Creatinine Ratio 0.37 (H) 0.00 - 0.15 mg/mg[Cre]  Urine microscopic-add on     Status: Abnormal   Collection Time: 11/25/14  4:00 PM  Result Value Ref Range   Squamous Epithelial / LPF FEW (A) RARE   WBC, UA 0-2 <3 WBC/hpf   RBC / HPF 0-2 <3 RBC/hpf   Bacteria, UA RARE RARE   Urine-Other MUCOUS PRESENT   CBC     Status: Abnormal   Collection Time: 11/25/14  4:10 PM  Result Value Ref  Range   WBC 9.2 4.0 - 10.5 K/uL   RBC 4.00 3.87 - 5.11 MIL/uL   Hemoglobin 10.0 (L) 12.0 - 15.0 g/dL   HCT 16.131.2 (L) 09.636.0 - 04.546.0 %   MCV 78.0 78.0 - 100.0 fL   MCH 25.0 (L) 26.0 - 34.0 pg   MCHC 32.1 30.0 - 36.0 g/dL   RDW 40.915.6 (H) 81.111.5 - 91.415.5 %   Platelets 255 150 - 400 K/uL  Comprehensive metabolic panel     Status: Abnormal   Collection Time: 11/25/14  4:10 PM  Result Value Ref Range   Sodium 135 135 -  145 mmol/L   Potassium 4.3 3.5 - 5.1 mmol/L   Chloride 106 101 - 111 mmol/L   CO2 20 (L) 22 - 32 mmol/L   Glucose, Bld 90 65 - 99 mg/dL   BUN 6 6 - 20 mg/dL   Creatinine, Ser 7.82 (L) 0.44 - 1.00 mg/dL   Calcium 9.2 8.9 - 95.6 mg/dL   Total Protein 6.5 6.5 - 8.1 g/dL   Albumin 2.8 (L) 3.5 - 5.0 g/dL   AST 16 15 - 41 U/L   ALT 10 (L) 14 - 54 U/L   Alkaline Phosphatase 120 38 - 126 U/L   Total Bilirubin <0.1 (L) 0.3 - 1.2 mg/dL   GFR calc non Af Amer >60 >60 mL/min   GFR calc Af Amer >60 >60 mL/min   Anion gap 9 5 - 15  Lactate dehydrogenase     Status: None   Collection Time: 11/25/14  4:10 PM  Result Value Ref Range   LDH 117 98 - 192 U/L  Uric acid     Status: None   Collection Time: 11/25/14  4:10 PM  Result Value Ref Range   Uric Acid, Serum 3.8 2.3 - 6.6 mg/dL    MDM CBC, CMP, LDH, uric acid, protein creatinine ratio Cycle BPs - labetalol protocol ordered for severe range BPs Category 1 tracing 1654-S/w Dr. Henderson Cloud; notified of labs, BPs, labetalol administration, & fetal tracing. Pt to be admitted to ante, late pre term steroids, & ultrasounds for BPP, AFI, & EFW. Pt NPO.  Assessment and Plan  A: 1. Gestational hypertension, third trimester   2. Fetal heart deceleration    P: Admit to antenatal for obs of BPs vs c/section First BMZ dose to be given in MAU Ultrasound - BPP, AFI, & EFW Labetalol protocol to continue in antenatal for severe range BPs  Judeth Horn, NP  11/25/2014, 4:10 PM

## 2014-11-26 DIAGNOSIS — O139 Gestational [pregnancy-induced] hypertension without significant proteinuria, unspecified trimester: Secondary | ICD-10-CM | POA: Diagnosis present

## 2014-11-26 DIAGNOSIS — Z3A35 35 weeks gestation of pregnancy: Secondary | ICD-10-CM | POA: Diagnosis not present

## 2014-11-26 DIAGNOSIS — O133 Gestational [pregnancy-induced] hypertension without significant proteinuria, third trimester: Secondary | ICD-10-CM | POA: Diagnosis present

## 2014-11-26 DIAGNOSIS — O34219 Maternal care for unspecified type scar from previous cesarean delivery: Secondary | ICD-10-CM | POA: Diagnosis present

## 2014-11-26 LAB — CBC
HEMATOCRIT: 27.1 % — AB (ref 36.0–46.0)
HEMOGLOBIN: 8.7 g/dL — AB (ref 12.0–15.0)
MCH: 25.1 pg — ABNORMAL LOW (ref 26.0–34.0)
MCHC: 32.1 g/dL (ref 30.0–36.0)
MCV: 78.3 fL (ref 78.0–100.0)
Platelets: 212 10*3/uL (ref 150–400)
RBC: 3.46 MIL/uL — ABNORMAL LOW (ref 3.87–5.11)
RDW: 15.7 % — AB (ref 11.5–15.5)
WBC: 12 10*3/uL — ABNORMAL HIGH (ref 4.0–10.5)

## 2014-11-26 LAB — RPR: RPR: NONREACTIVE

## 2014-11-26 MED ORDER — OXYCODONE HCL 5 MG PO TABS
5.0000 mg | ORAL_TABLET | ORAL | Status: DC | PRN
  Filled 2014-11-26: qty 1

## 2014-11-26 MED ORDER — ACETAMINOPHEN 325 MG PO TABS: 650.0000 mg | ORAL_TABLET | ORAL | Status: DC | PRN

## 2014-11-26 MED ORDER — BUTALBITAL-APAP-CAFFEINE 50-325-40 MG PO TABS
2.0000 | ORAL_TABLET | Freq: Four times a day (QID) | ORAL | Status: DC | PRN
  Administered 2014-11-26 – 2014-11-27 (×2): 2 via ORAL
  Filled 2014-11-26 (×2): qty 2

## 2014-11-26 NOTE — Progress Notes (Signed)
24 y.o. [id number] 5532w2d HD# admitted for 35w hbp.  Pt currently stable.  C/o mild HA this AM.  Has not eaten since yesterday evening.  No scotomata, no RUQ pain, no VB.  Good FM.  BP 117/62 mmHg  Pulse 96  Temp(Src) 97.8 F (36.6 C) (Oral)  Resp 20  Ht 5\' 1"  (1.549 m)  Wt 132.45 kg (292 lb)  BMI 55.20 kg/m2  LMP 02/22/2014 (Approximate)   NAD Abd  Soft, gravid, nontender Ex SCDs FHTs  120s, mod variability, + accels, no decels Toco  quiet  Results for orders placed or performed during the hospital encounter of 11/25/14 (from the past 24 hour(s))  Urinalysis, Routine w reflex microscopic (not [facility])     Status: Abnormal   Collection Time: 11/25/14  4:00 PM  Result Value Ref Range   Color, Urine YELLOW YELLOW   APPearance CLEAR CLEAR   Specific Gravity, Urine 1.025 1.005 - 1.030   pH 6.0 5.0 - 8.0   Glucose, UA NEGATIVE NEGATIVE mg/dL   Hgb urine dipstick NEGATIVE NEGATIVE   Bilirubin Urine NEGATIVE NEGATIVE   Ketones, ur NEGATIVE NEGATIVE mg/dL   Protein, ur NEGATIVE NEGATIVE mg/dL   Urobilinogen, UA 0.2 0.0 - 1.0 mg/dL   Nitrite NEGATIVE NEGATIVE   Leukocytes, UA SMALL (A) NEGATIVE  Protein / creatinine ratio, urine     Status: Abnormal   Collection Time: 11/25/14  4:00 PM  Result Value Ref Range   Creatinine, Urine 143.00 mg/dL   Total Protein, Urine 53 mg/dL   Protein Creatinine Ratio 0.37 (H) 0.00 - 0.15 mg/mg[Cre]  Urine microscopic-add on     Status: Abnormal   Collection Time: 11/25/14  4:00 PM  Result Value Ref Range   Squamous Epithelial / LPF FEW (A) RARE   WBC, UA 0-2 <3 WBC/hpf   RBC / HPF 0-2 <3 RBC/hpf   Bacteria, UA RARE RARE   Urine-Other MUCOUS PRESENT   CBC     Status: Abnormal   Collection Time: 11/25/14  4:10 PM  Result Value Ref Range   WBC 9.2 4.0 - 10.5 K/uL   RBC 4.00 3.87 - 5.11 MIL/uL   Hemoglobin 10.0 (L) 12.0 - 15.0 g/dL   HCT 63.031.2 (L) 16.036.0 - 10.946.0 %   MCV 78.0 78.0 - 100.0 fL   MCH 25.0 (L) 26.0 - 34.0 pg   MCHC 32.1 30.0 - 36.0  g/dL   RDW 32.315.6 (H) 55.711.5 - 32.215.5 %   Platelets 255 150 - 400 K/uL  Comprehensive metabolic panel     Status: Abnormal   Collection Time: 11/25/14  4:10 PM  Result Value Ref Range   Sodium 135 135 - 145 mmol/L   Potassium 4.3 3.5 - 5.1 mmol/L   Chloride 106 101 - 111 mmol/L   CO2 20 (L) 22 - 32 mmol/L   Glucose, Bld 90 65 - 99 mg/dL   BUN 6 6 - 20 mg/dL   Creatinine, Ser 0.250.40 (L) 0.44 - 1.00 mg/dL   Calcium 9.2 8.9 - 42.710.3 mg/dL   Total Protein 6.5 6.5 - 8.1 g/dL   Albumin 2.8 (L) 3.5 - 5.0 g/dL   AST 16 15 - 41 U/L   ALT 10 (L) 14 - 54 U/L   Alkaline Phosphatase 120 38 - 126 U/L   Total Bilirubin <0.1 (L) 0.3 - 1.2 mg/dL   GFR calc non Af Amer >60 >60 mL/min   GFR calc Af Amer >60 >60 mL/min   Anion gap 9 5 -  15  Lactate dehydrogenase     Status: None   Collection Time: 11/25/14  4:10 PM  Result Value Ref Range   LDH 117 98 - 192 U/L  Uric acid     Status: None   Collection Time: 11/25/14  4:10 PM  Result Value Ref Range   Uric Acid, Serum 3.8 2.3 - 6.6 mg/dL  Type and screen [facility]     Status: None   Collection Time: 11/25/14  4:10 PM  Result Value Ref Range   ABO/RH(D) A POS    Antibody Screen NEG    Sample Expiration 11/28/2014   RPR     Status: None   Collection Time: 11/25/14  4:10 PM  Result Value Ref Range   RPR Ser Ql Non Reactive Non Reactive  CBC     Status: Abnormal   Collection Time: 11/26/14  6:08 AM  Result Value Ref Range   WBC 12.0 (H) 4.0 - 10.5 K/uL   RBC 3.46 (L) 3.87 - 5.11 MIL/uL   Hemoglobin 8.7 (L) 12.0 - 15.0 g/dL   HCT 16.1 (L) 09.6 - 04.5 %   MCV 78.3 78.0 - 100.0 fL   MCH 25.1 (L) 26.0 - 34.0 pg   MCHC 32.1 30.0 - 36.0 g/dL   RDW 40.9 (H) 81.1 - 91.4 %   Platelets 212 150 - 400 K/uL    A:  HD#  [redacted]w[redacted]d with yesterday with known GHTN, elevated BPs.  P: Since admission yesterday, BPs have been mild range, 110-140s/70-80s.  Labs on admission were wnl.  Will allow patient to eat and treat headache with tylenol.   Fetal status reassuring at this time.  Will switch to BID fetal monitoring.  Is currently on labetalol  po BID.  Received 1 dose IV labetalol on admission Delivery warranted for persistently severe range BPs , abnormal labs, persistent symptoms or NRFS.  Can continue expectant management at this time.  Prematurity--BMZ series initiated on admission.    [facility] [employer]

## 2014-11-27 NOTE — Discharge Summary (Signed)
  Pt presented 11/25/14 from office with severe range BPs and HA.  She was already being treated with labetalol po.  She received one dose of IV labetalol and BPs have been normal to mild since.  She decines HA today, states Has come and go.  PEC labs wnl.  Pt has office appt tomorrow.  Discussed the evolving picture BP issues in pregnancy and the need for close monitoring.  Instructions and precautions reviewed with pt.  Pt states she has plenty of labetalol at home and will take as prescribed.  She will call with any new symptoms, decreased FM, LOF or VB.  Baby currently 135mod var +accels no decels and no ctxs on monitor.

## 2014-11-27 NOTE — Discharge Instructions (Signed)
Hypertension During Pregnancy °Hypertension is also called high blood pressure. Blood pressure moves blood in your body. Sometimes, the force that moves the blood becomes too strong. When you are pregnant, this condition should be watched carefully. It can cause problems for you and your baby. °HOME CARE  °· Make and keep all of your doctor visits. °· Take medicine as told by your doctor. Tell your doctor about all medicines you take. °· Eat very little salt. °· Exercise regularly. °· Do not drink alcohol. °· Do not smoke. °· Do not have drinks with caffeine. °· Lie on your left side when resting. °· Your health care provider may ask you to take one low-dose aspirin (81mg) each day. °GET HELP RIGHT AWAY IF: °· You have bad belly (abdominal) pain. °· You have sudden puffiness (swelling) in the hands, ankles, or face. °· You gain 4 pounds (1.8 kilograms) or more in 1 week. °· You throw up (vomit) repeatedly. °· You have bleeding from the vagina. °· You do not feel the baby moving as much. °· You have a headache. °· You have blurred or double vision. °· You have muscle twitching or spasms. °· You have shortness of breath. °· You have blue fingernails and lips. °· You have blood in your pee (urine). °MAKE SURE YOU: °· Understand these instructions. °· Will watch your condition. °· Will get help right away if you are not doing well or get worse. °  °This information is not intended to replace advice given to you by your health care provider. Make sure you discuss any questions you have with your health care provider. °  °Document Released: 02/05/2010 Document Revised: 01/24/2014 Document Reviewed: 08/02/2012 °Elsevier Interactive Patient Education ©2016 [employer] Inc[employer] ° °Preeclampsia and Eclampsia °Preeclampsia is a serious condition that develops only during pregnancy. It is also called toxemia of pregnancy. This condition causes high blood pressure along with other symptoms, such as swelling and headaches. These may  develop as the condition gets worse. Preeclampsia may occur 20 weeks or later into your pregnancy.  °Diagnosing and treating preeclampsia early is very important. If not treated early, it can cause serious problems for you and your baby. One problem it can lead to is eclampsia, which is a condition that causes muscle jerking or shaking (convulsions) in the mother. Delivering your baby is the best treatment for preeclampsia or eclampsia.  °RISK FACTORS °The cause of preeclampsia is not known. You may be more likely to develop preeclampsia if you have certain risk factors. These include:  °· Being pregnant for the first time. °· Having preeclampsia in a past pregnancy. °· Having a family history of preeclampsia. °· Having high blood pressure. °· Being pregnant with twins or triplets. °· Being 35 or older. °· Being African American. °· Having kidney disease or diabetes. °· Having medical conditions such as lupus or blood diseases. °· Being very overweight (obese). °SIGNS AND SYMPTOMS  °The earliest signs of preeclampsia are: °· High blood pressure. °· Increased protein in your urine. Your health care provider will check for this at every prenatal visit. °Other symptoms that can develop include:  °· Severe headaches. °· Sudden weight gain. °· Swelling of your hands, face, legs, and feet. °· Feeling sick to your stomach (nauseous) and throwing up (vomiting). °· Vision problems (blurred or double vision). °· Numbness in your face, arms, legs, and feet. °· Dizziness. °· Slurred speech. °· Sensitivity to bright lights. °· Abdominal pain. °DIAGNOSIS  °There are no screening tests   for preeclampsia. Your health care provider will ask you about symptoms and check for signs of preeclampsia during your prenatal visits. You may also have tests, including: °· Urine testing. °· Blood testing. °· Checking your baby's heart rate. °· Checking the health of your baby and your placenta using images created with sound waves  (ultrasound). °TREATMENT  °You can work out the best treatment approach together with your health care provider. It is very important to keep all prenatal appointments. If you have an increased risk of preeclampsia, you may need more frequent prenatal exams. °· Your health care provider may prescribe bed rest. °· You may have to eat as little salt as possible. °· You may need to take medicine to lower your blood pressure if the condition does not respond to more conservative measures. °· You may need to stay in the hospital if your condition is severe. There, treatment will focus on controlling your blood pressure and fluid retention. You may also need to take medicine to prevent seizures. °· If the condition gets worse, your baby may need to be delivered early to protect you and the baby. You may have your labor started with medicine (be induced), or you may have a cesarean delivery. °· Preeclampsia usually goes away after the baby is born. °HOME CARE INSTRUCTIONS  °· Only take over-the-counter or prescription medicines as directed by your health care provider. °· Lie on your left side while resting. This keeps pressure off your baby. °· Elevate your feet while resting. °· Get regular exercise. Ask your health care provider what type of exercise is safe for you. °· Avoid caffeine and alcohol. °· Do not smoke. °· Drink 6-8 glasses of water every day. °· Eat a balanced diet that is low in salt. Do not add salt to your food. °· Avoid stressful situations as much as possible. °· Get plenty of rest and sleep. °· Keep all prenatal appointments and tests as scheduled. °SEEK MEDICAL CARE IF: °· You are gaining more weight than expected. °· You have any headaches, abdominal pain, or nausea. °· You are bruising more than usual. °· You feel dizzy or light-headed. °SEEK IMMEDIATE MEDICAL CARE IF:  °· You develop sudden or severe swelling anywhere in your body. This usually happens in the legs. °· You gain 5 lb (2.3 kg) or more  in a week. °· You have a severe headache, dizziness, problems with your vision, or confusion. °· You have severe abdominal pain. °· You have lasting nausea or vomiting. °· You have a seizure. °· You have trouble moving any part of your body. °· You develop numbness in your body. °· You have trouble speaking. °· You have any abnormal bleeding. °· You develop a stiff neck. °· You pass out. °MAKE SURE YOU:  °· Understand these instructions. °· Will watch your condition. °· Will get help right away if you are not doing well or get worse. °  °This information is not intended to replace advice given to you by your health care provider. Make sure you discuss any questions you have with your health care provider. °  °Document Released: 01/01/2000 Document Revised: 01/08/2013 Document Reviewed: 10/26/2012 °Elsevier Interactive Patient Education ©2016 Elsevier Inc. ° °

## 2014-11-28 ENCOUNTER — Encounter (HOSPITAL_COMMUNITY): Payer: Self-pay | Admitting: Anesthesiology

## 2014-11-30 ENCOUNTER — Inpatient Hospital Stay (EMERGENCY_DEPARTMENT_HOSPITAL)
Admission: AD | Admit: 2014-11-30 | Discharge: 2014-11-30 | Disposition: A | Discharge status: Home / Self Care | Payer: 59 | Source: Ambulatory Visit | Attending: Obstetrics and Gynecology | Admitting: Obstetrics and Gynecology

## 2014-11-30 ENCOUNTER — Encounter (HOSPITAL_COMMUNITY): Payer: Self-pay | Admitting: *Deleted

## 2014-11-30 DIAGNOSIS — O133 Gestational [pregnancy-induced] hypertension without significant proteinuria, third trimester: Secondary | ICD-10-CM | POA: Diagnosis not present

## 2014-11-30 DIAGNOSIS — O1414 Severe pre-eclampsia complicating childbirth: Secondary | ICD-10-CM | POA: Diagnosis not present

## 2014-11-30 LAB — CBC
HEMATOCRIT: 31.3 % — AB (ref 36.0–46.0)
Hemoglobin: 10.1 g/dL — ABNORMAL LOW (ref 12.0–15.0)
MCH: 24.9 pg — ABNORMAL LOW (ref 26.0–34.0)
MCHC: 32.3 g/dL (ref 30.0–36.0)
MCV: 77.3 fL — AB (ref 78.0–100.0)
PLATELETS: 257 10*3/uL (ref 150–400)
RBC: 4.05 MIL/uL (ref 3.87–5.11)
RDW: 15.8 % — ABNORMAL HIGH (ref 11.5–15.5)
WBC: 9.2 10*3/uL (ref 4.0–10.5)

## 2014-11-30 LAB — PROTEIN / CREATININE RATIO, URINE
Creatinine, Urine: 304 mg/dL
Protein Creatinine Ratio: 0.22 mg/mg{Cre} — ABNORMAL HIGH (ref 0.00–0.15)
Total Protein, Urine: 67 mg/dL

## 2014-11-30 LAB — COMPREHENSIVE METABOLIC PANEL
ALBUMIN: 2.9 g/dL — AB (ref 3.5–5.0)
ALT: 10 U/L — AB (ref 14–54)
AST: 18 U/L (ref 15–41)
Alkaline Phosphatase: 110 U/L (ref 38–126)
Anion gap: 7 (ref 5–15)
BUN: 7 mg/dL (ref 6–20)
CHLORIDE: 109 mmol/L (ref 101–111)
CO2: 21 mmol/L — ABNORMAL LOW (ref 22–32)
CREATININE: 0.43 mg/dL — AB (ref 0.44–1.00)
Calcium: 8.8 mg/dL — ABNORMAL LOW (ref 8.9–10.3)
GFR calc Af Amer: >60 mL/min (ref >60)
GLUCOSE: 136 mg/dL — AB (ref 65–99)
Potassium: 3.7 mmol/L (ref 3.5–5.1)
Sodium: 137 mmol/L (ref 135–145)
Total Bilirubin: 0.3 mg/dL (ref 0.3–1.2)
Total Protein: 6.9 g/dL (ref 6.5–8.1)

## 2014-11-30 LAB — URIC ACID: URIC ACID, SERUM: 4.4 mg/dL (ref 2.3–6.6)

## 2014-11-30 LAB — LACTATE DEHYDROGENASE: LDH: 122 U/L (ref 98–192)

## 2014-11-30 MED ORDER — OXYCODONE-ACETAMINOPHEN 5-325 MG PO TABS
2.0000 | ORAL_TABLET | Freq: Once | ORAL | Status: AC
  Administered 2014-11-30: 2 via ORAL
  Filled 2014-11-30: qty 2

## 2014-11-30 NOTE — Discharge Instructions (Signed)
Hypertension During Pregnancy °Hypertension is also called high blood pressure. Blood pressure moves blood in your body. Sometimes, the force that moves the blood becomes too strong. When you are pregnant, this condition should be watched carefully. It can cause problems for you and your baby. °HOME CARE  °· Make and keep all of your doctor visits. °· Take medicine as told by your doctor. Tell your doctor about all medicines you take. °· Eat very little salt. °· Exercise regularly. °· Do not drink alcohol. °· Do not smoke. °· Do not have drinks with caffeine. °· Lie on your left side when resting. °· Your health care provider may ask you to take one low-dose aspirin (81mg) each day. °GET HELP RIGHT AWAY IF: °· You have bad belly (abdominal) pain. °· You have sudden puffiness (swelling) in the hands, ankles, or face. °· You gain 4 pounds (1.8 kilograms) or more in 1 week. °· You throw up (vomit) repeatedly. °· You have bleeding from the vagina. °· You do not feel the baby moving as much. °· You have a headache. °· You have blurred or double vision. °· You have muscle twitching or spasms. °· You have shortness of breath. °· You have blue fingernails and lips. °· You have blood in your pee (urine). °MAKE SURE YOU: °· Understand these instructions. °· Will watch your condition. °· Will get help right away if you are not doing well or get worse. °  °This information is not intended to replace advice given to you by your health care provider. Make sure you discuss any questions you have with your health care provider. °  °Document Released: 02/05/2010 Document Revised: 01/24/2014 Document Reviewed: 08/02/2012 °Elsevier Interactive Patient Education ©2016 Elsevier Inc. ° °

## 2014-11-30 NOTE — MAU Note (Addendum)
Was on ante Tue through Thurs, HA, high BP.  Was told if HA continues and Fioricet does not help- to return. 178/102. Blurring at times, no increase in swelling, denies epigastric

## 2014-11-30 NOTE — MAU Provider Note (Signed)
History     CSN: [id number]  Arrival date and time: 11/30/14 1149   None     Chief Complaint  Patient presents with  . Hypertension   HPI 24 y.o. [id number] at [redacted]w[redacted]d complaining of headache since waking at 7:30 this morning,with only minimal relief from Fiorcet x 2, and elevated BP at home, 170/102. This pregnancy has been complicated by hypertension, patient was admitted from 11/8 - 11/9 w/ elevated BP and received BMZ x 2 doses at that time, current taking Labetalol 300 mg BID, last dose 7:30 AM. Prior C/S x 1, repeat scheduled for 11/21.   Past Medical History  Diagnosis Date  . Pregnancy induced hypertension   . Anemia   . Obese   . Hx of varicella   . Asthma     childhood    Past Surgical History  Procedure Laterality Date  . Tonsillectomy    . Cesarean section N/A 03/09/2012    Procedure: CESAREAN SECTION;  Surgeon: Freddrick March. Tenny Craw, MD;  Location: [facility];  Service: Obstetrics;  Laterality: N/A;  . Umbilical hernia repair N/A 06/21/2014    Procedure: EXPLORATORY LAPAROTOMY,  SMALL BOWEL RESECTION, PRIMARY REPAIR INCARCERATED VENTRAL HERNIA;  Surgeon: Darnell Level, MD;  Location: [facility];  Service: General;  Laterality: N/A;    Family History  Problem Relation Age of Onset  . Anemia Mother   . Anemia Sister   . Birth defects Brother     heart disorder?  Marland Kitchen Hypertension Maternal Grandmother   . Diabetes Maternal Grandmother   . Hypertension Maternal Grandfather   . Diabetes Maternal Grandfather   . Hypertension Paternal Grandmother   . Hypertension Paternal Grandfather     Social History  Substance Use Topics  . Smoking status: Never Smoker   . Smokeless tobacco: Never Used  . Alcohol Use: No    Allergies: No Known Allergies  No prescriptions prior to admission    Review of Systems  Constitutional: Negative.   Eyes: Positive for blurred vision.  Respiratory: Negative.  Negative for shortness of breath.   Cardiovascular: Negative.  Negative for chest pain.   Gastrointestinal: Positive for nausea and vomiting. Negative for abdominal pain and diarrhea.  Genitourinary: Negative.   Musculoskeletal: Negative.   Neurological: Positive for headaches. Negative for dizziness, sensory change and loss of consciousness.   Physical Exam   Blood pressure 137/92, pulse 106, temperature 98.1 F (36.7 C), temperature source Oral, resp. rate 18, weight 290 lb 6.4 oz (131.725 kg), last menstrual period 02/22/2014.  Physical Exam  Nursing note and vitals reviewed. Constitutional: She is oriented to person, place, and time. She appears well-developed and well-nourished. No distress.  Cardiovascular: Normal rate.   Respiratory: Effort normal.  GI: Soft. Distention: c/w dates. There is no tenderness. There is no rebound and no guarding.  Musculoskeletal: Normal range of motion.  Neurological: She is alert and oriented to person, place, and time. She has normal reflexes.  Skin: Skin is warm and dry.  Psychiatric: She has a normal mood and affect.    FHR: 155 bpm, variability: moderate,  accelerations:  Present,  decelerations:  Absent TOCO: quiet  MAU Course  Procedures  Results for orders placed or performed during the hospital encounter of 11/30/14 (from the past 24 hour(s))  CBC     Status: Abnormal   Collection Time: 11/30/14 12:10 PM  Result Value Ref Range   WBC 9.2 4.0 - 10.5 K/uL   RBC 4.05 3.87 - 5.11 MIL/uL   Hemoglobin  10.1 (L) 12.0 - 15.0 g/dL   HCT 16.1 (L) 09.6 - 04.5 %   MCV 77.3 (L) 78.0 - 100.0 fL   MCH 24.9 (L) 26.0 - 34.0 pg   MCHC 32.3 30.0 - 36.0 g/dL   RDW 40.9 (H) 81.1 - 91.4 %   Platelets 257 150 - 400 K/uL  Comprehensive metabolic panel     Status: Abnormal   Collection Time: 11/30/14 12:10 PM  Result Value Ref Range   Sodium 137 135 - 145 mmol/L   Potassium 3.7 3.5 - 5.1 mmol/L   Chloride 109 101 - 111 mmol/L   CO2 21 (L) 22 - 32 mmol/L   Glucose, Bld 136 (H) 65 - 99 mg/dL   BUN 7 6 - 20 mg/dL   Creatinine, Ser 7.82  (L) 0.44 - 1.00 mg/dL   Calcium 8.8 (L) 8.9 - 10.3 mg/dL   Total Protein 6.9 6.5 - 8.1 g/dL   Albumin 2.9 (L) 3.5 - 5.0 g/dL   AST 18 15 - 41 U/L   ALT 10 (L) 14 - 54 U/L   Alkaline Phosphatase 110 38 - 126 U/L   Total Bilirubin 0.3 0.3 - 1.2 mg/dL   GFR calc non Af Amer >60 >60 mL/min   GFR calc Af Amer >60 >60 mL/min   Anion gap 7 5 - 15  Uric acid     Status: None   Collection Time: 11/30/14 12:10 PM  Result Value Ref Range   Uric Acid, Serum 4.4 2.3 - 6.6 mg/dL  Lactate dehydrogenase     Status: None   Collection Time: 11/30/14 12:10 PM  Result Value Ref Range   LDH 122 98 - 192 U/L  Protein / creatinine ratio, urine     Status: Abnormal   Collection Time: 11/30/14 12:10 PM  Result Value Ref Range   Creatinine, Urine 304.00 mg/dL   Total Protein, Urine 67 mg/dL   Protein Creatinine Ratio 0.22 (H) 0.00 - 0.15 mg/mg[Cre]   Percocet 5/325 #2 for headache, patient reports no improvement after about 30 minutes, but does not appear in any acute distress  Assessment and Plan   1. PIH (pregnancy induced hypertension), third trimester   Normal labs, no severe range BP, stable maternal-fetal status - rev'd precautions, f/u in office as scheduled in 2 days or sooner PRN    Medication List    TAKE these medications        albuterol 108 (90 BASE) MCG/ACT inhaler  Commonly known as:  PROVENTIL HFA;VENTOLIN HFA  Inhale 2 puffs into the lungs every 4 (four) hours as needed for wheezing or shortness of breath.     butalbital-acetaminophen-caffeine 50-325-40 MG tablet  Commonly known as:  FIORICET, ESGIC  Take 1 tablet by mouth every 6 (six) hours as needed for headache.     calcium carbonate 500 MG chewable tablet  Commonly known as:  TUMS - dosed in mg elemental calcium  Chew 3 tablets by mouth 3 (three) times daily as needed for indigestion or heartburn.     cetirizine 10 MG tablet  Commonly known as:  ZYRTEC  Take 10 mg by mouth daily.     [employer] PRENATAL GUMMY PO  Take 2  each by mouth 2 (two) times daily.     guaiFENesin 600 MG 12 hr tablet  Commonly known as:  MUCINEX  Take 600 mg by mouth 2 (two) times daily as needed for cough or to loosen phlegm.     IRON PO  Take  325 mg by mouth 3 (three) times daily.     labetalol 300 MG tablet  Commonly known as:  NORMODYNE  Take 300 mg by mouth 2 (two) times daily.     sertraline 50 MG tablet  Commonly known as:  ZOLOFT  Take 50 mg by mouth daily.            Follow-up Information    Follow up with Almon HerculesOSS,KENDRA H., MD.   Specialty:  Obstetrics and Gynecology   Why:  as scheduled or sooner as needed   Contact information:   [address] [place] [address] [phone]         FRAZIER,NATALIE 11/30/2014, 2:10 PM

## 2014-12-03 ENCOUNTER — Encounter (HOSPITAL_COMMUNITY): Payer: Self-pay | Admitting: *Deleted

## 2014-12-03 ENCOUNTER — Inpatient Hospital Stay (HOSPITAL_COMMUNITY): Payer: 59 | Admitting: Anesthesiology

## 2014-12-03 ENCOUNTER — Inpatient Hospital Stay (HOSPITAL_COMMUNITY)
Admission: AD | Admit: 2014-12-03 | Discharge: 2014-12-07 | DRG: 765 | Disposition: A | Discharge status: Home / Self Care | Payer: 59 | Source: Ambulatory Visit | Attending: Obstetrics | Admitting: Obstetrics

## 2014-12-03 ENCOUNTER — Encounter (HOSPITAL_COMMUNITY)
Admission: AD | Disposition: A | Discharge status: Home / Self Care | Payer: Self-pay | Source: Ambulatory Visit | Attending: Obstetrics

## 2014-12-03 DIAGNOSIS — O1414 Severe pre-eclampsia complicating childbirth: Principal | ICD-10-CM | POA: Diagnosis present

## 2014-12-03 DIAGNOSIS — O34211 Maternal care for low transverse scar from previous cesarean delivery: Secondary | ICD-10-CM | POA: Diagnosis present

## 2014-12-03 DIAGNOSIS — O1092 Unspecified pre-existing hypertension complicating childbirth: Secondary | ICD-10-CM | POA: Diagnosis present

## 2014-12-03 DIAGNOSIS — O99214 Obesity complicating childbirth: Secondary | ICD-10-CM | POA: Diagnosis present

## 2014-12-03 DIAGNOSIS — F419 Anxiety disorder, unspecified: Secondary | ICD-10-CM | POA: Diagnosis present

## 2014-12-03 DIAGNOSIS — Z6841 Body Mass Index (BMI) 40.0 and over, adult: Secondary | ICD-10-CM

## 2014-12-03 DIAGNOSIS — Z3A36 36 weeks gestation of pregnancy: Secondary | ICD-10-CM

## 2014-12-03 DIAGNOSIS — O133 Gestational [pregnancy-induced] hypertension without significant proteinuria, third trimester: Secondary | ICD-10-CM

## 2014-12-03 DIAGNOSIS — O139 Gestational [pregnancy-induced] hypertension without significant proteinuria, unspecified trimester: Secondary | ICD-10-CM | POA: Diagnosis present

## 2014-12-03 HISTORY — DX: Depression, unspecified: F32.A

## 2014-12-03 HISTORY — DX: Major depressive disorder, single episode, unspecified: F32.9

## 2014-12-03 HISTORY — DX: Anxiety disorder, unspecified: F41.9

## 2014-12-03 LAB — COMPREHENSIVE METABOLIC PANEL
ALBUMIN: 2.9 g/dL — AB (ref 3.5–5.0)
ALT: 13 U/L — ABNORMAL LOW (ref 14–54)
ANION GAP: 9 (ref 5–15)
AST: 20 U/L (ref 15–41)
Alkaline Phosphatase: 109 U/L (ref 38–126)
BILIRUBIN TOTAL: 0.2 mg/dL — AB (ref 0.3–1.2)
BUN: 9 mg/dL (ref 6–20)
CHLORIDE: 107 mmol/L (ref 101–111)
CO2: 18 mmol/L — AB (ref 22–32)
Calcium: 8.9 mg/dL (ref 8.9–10.3)
Creatinine, Ser: 0.44 mg/dL (ref 0.44–1.00)
GFR calc Af Amer: >60 mL/min (ref >60)
GFR calc non Af Amer: >60 mL/min (ref >60)
GLUCOSE: 129 mg/dL — AB (ref 65–99)
POTASSIUM: 3.8 mmol/L (ref 3.5–5.1)
SODIUM: 134 mmol/L — AB (ref 135–145)
TOTAL PROTEIN: 6.9 g/dL (ref 6.5–8.1)

## 2014-12-03 LAB — TYPE AND SCREEN
ABO/RH(D): A POS
Antibody Screen: NEGATIVE

## 2014-12-03 LAB — CBC
HEMATOCRIT: 31.1 % — AB (ref 36.0–46.0)
Hemoglobin: 10 g/dL — ABNORMAL LOW (ref 12.0–15.0)
MCH: 25.1 pg — ABNORMAL LOW (ref 26.0–34.0)
MCHC: 32.2 g/dL (ref 30.0–36.0)
MCV: 77.9 fL — ABNORMAL LOW (ref 78.0–100.0)
Platelets: 215 10*3/uL (ref 150–400)
RBC: 3.99 MIL/uL (ref 3.87–5.11)
RDW: 15.6 % — AB (ref 11.5–15.5)
WBC: 8.8 10*3/uL (ref 4.0–10.5)

## 2014-12-03 LAB — URINALYSIS, ROUTINE W REFLEX MICROSCOPIC
Bilirubin Urine: NEGATIVE
Glucose, UA: NEGATIVE mg/dL
HGB URINE DIPSTICK: NEGATIVE
KETONES UR: NEGATIVE mg/dL
Leukocytes, UA: NEGATIVE
Nitrite: NEGATIVE
PROTEIN: NEGATIVE mg/dL
Specific Gravity, Urine: >1.03 — ABNORMAL HIGH (ref 1.005–1.030)
pH: 6 (ref 5.0–8.0)

## 2014-12-03 LAB — PROTEIN / CREATININE RATIO, URINE
Creatinine, Urine: 175 mg/dL
PROTEIN CREATININE RATIO: 0.22 mg/mg{creat} — AB (ref 0.00–0.15)
Total Protein, Urine: 39 mg/dL

## 2014-12-03 SURGERY — Surgical Case
Anesthesia: Spinal

## 2014-12-03 MED ORDER — ACETAMINOPHEN 325 MG PO TABS: 650.0000 mg | ORAL_TABLET | ORAL | Status: DC | PRN

## 2014-12-03 MED ORDER — PHENYLEPHRINE 40 MCG/ML (10ML) SYRINGE FOR IV PUSH (FOR BLOOD PRESSURE SUPPORT)
PREFILLED_SYRINGE | INTRAVENOUS | Status: AC
  Filled 2014-12-03: qty 10

## 2014-12-03 MED ORDER — LACTATED RINGERS IV SOLN
2.0000 g/h | INTRAVENOUS | Status: DC
  Administered 2014-12-03: 2 g/h via INTRAVENOUS
  Filled 2014-12-03: qty 80

## 2014-12-03 MED ORDER — FENTANYL CITRATE (PF) 100 MCG/2ML IJ SOLN
INTRAMUSCULAR | Status: AC
  Filled 2014-12-03: qty 2

## 2014-12-03 MED ORDER — MISOPROSTOL 200 MCG PO TABS
ORAL_TABLET | ORAL | Status: AC
  Filled 2014-12-03: qty 5

## 2014-12-03 MED ORDER — LACTATED RINGERS IV SOLN
INTRAVENOUS | Status: DC | PRN
  Administered 2014-12-03: 23:00:00 via INTRAVENOUS

## 2014-12-03 MED ORDER — SODIUM BICARBONATE 8.4 % IV SOLN
INTRAVENOUS | Status: AC
  Filled 2014-12-03: qty 50

## 2014-12-03 MED ORDER — DOCUSATE SODIUM 100 MG PO CAPS
100.0000 mg | ORAL_CAPSULE | Freq: Every day | ORAL | Status: DC
Start: 2014-12-04 — End: 2014-12-04

## 2014-12-03 MED ORDER — FENTANYL CITRATE (PF) 100 MCG/2ML IJ SOLN
INTRAMUSCULAR | Status: DC | PRN
  Administered 2014-12-03: 100 ug via EPIDURAL

## 2014-12-03 MED ORDER — MORPHINE SULFATE (PF) 0.5 MG/ML IJ SOLN
INTRAMUSCULAR | Status: AC
  Filled 2014-12-03: qty 10

## 2014-12-03 MED ORDER — OXYTOCIN 10 UNIT/ML IJ SOLN
40.0000 [IU] | INTRAMUSCULAR | Status: DC | PRN
  Administered 2014-12-03: 40 [IU] via INTRAVENOUS

## 2014-12-03 MED ORDER — MAGNESIUM SULFATE BOLUS VIA INFUSION
4.0000 g | Freq: Once | INTRAVENOUS | Status: AC
  Administered 2014-12-03: 4 g via INTRAVENOUS
  Filled 2014-12-03: qty 500

## 2014-12-03 MED ORDER — SODIUM BICARBONATE 8.4 % IV SOLN
INTRAVENOUS | Status: DC | PRN
  Administered 2014-12-03: 3 mL via EPIDURAL
  Administered 2014-12-03: 1 mL via EPIDURAL
  Administered 2014-12-03: 4 mL via EPIDURAL

## 2014-12-03 MED ORDER — MORPHINE SULFATE (PF) 0.5 MG/ML IJ SOLN
INTRAMUSCULAR | Status: DC | PRN
  Administered 2014-12-03: 4 mg via EPIDURAL
  Administered 2014-12-03: 1 mg via EPIDURAL

## 2014-12-03 MED ORDER — CALCIUM CARBONATE ANTACID 500 MG PO CHEW: 2.0000 | CHEWABLE_TABLET | ORAL | Status: DC | PRN

## 2014-12-03 MED ORDER — CITRIC ACID-SODIUM CITRATE 334-500 MG/5ML PO SOLN
ORAL | Status: AC
  Administered 2014-12-03: 30 mL
  Filled 2014-12-03: qty 15

## 2014-12-03 MED ORDER — BUPIVACAINE IN DEXTROSE 0.75-8.25 % IT SOLN
INTRATHECAL | Status: AC
  Filled 2014-12-03: qty 2

## 2014-12-03 MED ORDER — METOCLOPRAMIDE HCL 10 MG PO TABS
10.0000 mg | ORAL_TABLET | Freq: Once | ORAL | Status: AC
  Administered 2014-12-03: 10 mg via ORAL
  Filled 2014-12-03: qty 1

## 2014-12-03 MED ORDER — OXYTOCIN 10 UNIT/ML IJ SOLN
INTRAMUSCULAR | Status: AC
  Filled 2014-12-03: qty 4

## 2014-12-03 MED ORDER — PHENYLEPHRINE 8 MG IN D5W 100 ML (0.08MG/ML) PREMIX OPTIME
INJECTION | INTRAVENOUS | Status: AC
  Filled 2014-12-03: qty 100

## 2014-12-03 MED ORDER — OXYCODONE-ACETAMINOPHEN 5-325 MG PO TABS: 2.0000 | ORAL_TABLET | Freq: Four times a day (QID) | ORAL | Status: DC | PRN

## 2014-12-03 MED ORDER — LACTATED RINGERS IV SOLN
INTRAVENOUS | Status: DC
  Administered 2014-12-03: 21:00:00 via INTRAVENOUS

## 2014-12-03 MED ORDER — ONDANSETRON HCL 4 MG/2ML IJ SOLN
INTRAMUSCULAR | Status: DC | PRN
Start: 2014-12-03 — End: 2014-12-03
  Administered 2014-12-03: 4 mg via INTRAVENOUS

## 2014-12-03 MED ORDER — PHENYLEPHRINE 8 MG IN D5W 100 ML (0.08MG/ML) PREMIX OPTIME
INJECTION | INTRAVENOUS | Status: DC | PRN
  Administered 2014-12-03: 40 ug/min via INTRAVENOUS

## 2014-12-03 MED ORDER — MORPHINE SULFATE (PF) 0.5 MG/ML IJ SOLN
INTRAMUSCULAR | Status: AC
Start: 2014-12-03 — End: 2014-12-03
  Filled 2014-12-03: qty 10

## 2014-12-03 MED ORDER — PRENATAL MULTIVITAMIN CH: 1.0000 | ORAL_TABLET | Freq: Every day | ORAL | Status: DC

## 2014-12-03 MED ORDER — LACTATED RINGERS IV SOLN
INTRAVENOUS | Status: DC | PRN
  Administered 2014-12-03: 22:00:00 via INTRAVENOUS

## 2014-12-03 MED ORDER — ONDANSETRON HCL 4 MG/2ML IJ SOLN
INTRAMUSCULAR | Status: AC
  Filled 2014-12-03: qty 2

## 2014-12-03 MED ORDER — DEXTROSE 5 % IV SOLN
3.0000 g | INTRAVENOUS | Status: AC
  Administered 2014-12-03: 3 g via INTRAVENOUS
  Filled 2014-12-03: qty 3000

## 2014-12-03 MED ORDER — PHENYLEPHRINE HCL 10 MG/ML IJ SOLN
INTRAMUSCULAR | Status: DC | PRN
  Administered 2014-12-03: 80 ug via INTRAVENOUS
  Administered 2014-12-03: 40 ug via INTRAVENOUS
  Administered 2014-12-03: 120 ug via INTRAVENOUS
  Administered 2014-12-03 (×2): 80 ug via INTRAVENOUS

## 2014-12-03 MED ORDER — ZOLPIDEM TARTRATE 5 MG PO TABS: 5.0000 mg | ORAL_TABLET | Freq: Every evening | ORAL | Status: DC | PRN

## 2014-12-03 MED ORDER — LIDOCAINE-EPINEPHRINE (PF) 2 %-1:200000 IJ SOLN
INTRAMUSCULAR | Status: AC
  Filled 2014-12-03: qty 20

## 2014-12-03 SURGICAL SUPPLY — 40 items
APL SKNCLS STERI-STRIP NONHPOA (GAUZE/BANDAGES/DRESSINGS) ×1
BENZOIN TINCTURE PRP APPL 2/3 (GAUZE/BANDAGES/DRESSINGS) ×3 IMPLANT
CLAMP CORD UMBIL (MISCELLANEOUS) IMPLANT
CLOSURE WOUND 1/2 X4 (GAUZE/BANDAGES/DRESSINGS) ×1
CLOTH BEACON ORANGE TIMEOUT ST (SAFETY) ×3 IMPLANT
DRAPE SHEET LG 3/4 BI-LAMINATE (DRAPES) IMPLANT
DRSG OPSITE POSTOP 4X10 (GAUZE/BANDAGES/DRESSINGS) ×3 IMPLANT
DURAPREP 26ML APPLICATOR (WOUND CARE) ×3 IMPLANT
ELECT REM PT RETURN 9FT ADLT (ELECTROSURGICAL) ×3
ELECTRODE REM PT RTRN 9FT ADLT (ELECTROSURGICAL) ×1 IMPLANT
EXTRACTOR VACUUM KIWI (MISCELLANEOUS) IMPLANT
GLOVE BIO SURGEON STRL SZ 6 (GLOVE) ×3 IMPLANT
GLOVE BIOGEL PI IND STRL 6.5 (GLOVE) ×1 IMPLANT
GLOVE BIOGEL PI IND STRL 7.0 (GLOVE) ×1 IMPLANT
GLOVE BIOGEL PI INDICATOR 6.5 (GLOVE) ×2
GLOVE BIOGEL PI INDICATOR 7.0 (GLOVE) ×2
GOWN STRL REUS W/TWL LRG LVL3 (GOWN DISPOSABLE) ×6 IMPLANT
KIT ABG SYR 3ML LUER SLIP (SYRINGE) IMPLANT
NDL HYPO 25X5/8 SAFETYGLIDE (NEEDLE) IMPLANT
NEEDLE HYPO 25X5/8 SAFETYGLIDE (NEEDLE) IMPLANT
NS IRRIG 1000ML POUR BTL (IV SOLUTION) ×3 IMPLANT
PACK C SECTION WH (CUSTOM PROCEDURE TRAY) ×3 IMPLANT
PAD OB MATERNITY 4.3X12.25 (PERSONAL CARE ITEMS) ×3 IMPLANT
PENCIL SMOKE EVAC W/HOLSTER (ELECTROSURGICAL) ×3 IMPLANT
RETAINER VISCERAL (MISCELLANEOUS) ×2 IMPLANT
RTRCTR C-SECT PINK 25CM LRG (MISCELLANEOUS) ×3 IMPLANT
STRIP CLOSURE SKIN 1/2X4 (GAUZE/BANDAGES/DRESSINGS) ×2 IMPLANT
SUT MNCRL 0 VIOLET CTX 36 (SUTURE) ×2 IMPLANT
SUT MNCRL AB 3-0 PS2 27 (SUTURE) ×3 IMPLANT
SUT MONOCRYL 0 CTX 36 (SUTURE) ×4
SUT PLAIN 0 NONE (SUTURE) IMPLANT
SUT PLAIN 2 0 (SUTURE) ×3
SUT PLAIN 2 0 XLH (SUTURE) ×2 IMPLANT
SUT PLAIN ABS 2-0 CT1 27XMFL (SUTURE) ×1 IMPLANT
SUT VIC AB 0 CTX 36 (SUTURE) ×6
SUT VIC AB 0 CTX36XBRD ANBCTRL (SUTURE) ×2 IMPLANT
SUT VIC AB 2-0 CT1 27 (SUTURE) ×3
SUT VIC AB 2-0 CT1 TAPERPNT 27 (SUTURE) ×1 IMPLANT
TOWEL OR 17X24 6PK STRL BLUE (TOWEL DISPOSABLE) ×3 IMPLANT
TRAY FOLEY CATH SILVER 14FR (SET/KITS/TRAYS/PACK) ×3 IMPLANT

## 2014-12-03 NOTE — Anesthesia Procedure Notes (Signed)
Spinal Patient location during procedure: OR Start time: 12/03/2014 10:06 PM End time: 12/03/2014 10:09 PM Staffing Anesthesiologist: Leilani AbleHATCHETT, Tamsin Nader Performed by: anesthesiologist  Preanesthetic Checklist Completed: patient identified, surgical consent, pre-op evaluation, timeout performed, IV checked, risks and benefits discussed and monitors and equipment checked Spinal Block Patient position: sitting Prep: site prepped and draped and DuraPrep Patient monitoring: cardiac monitor, continuous pulse ox, blood pressure and heart rate Approach: midline Location: L3-4 Injection technique: catheter Needle Needle type: Tuohy and Sprotte  Needle gauge: 24 G Needle length: 12.7 cm Needle insertion depth: 8 cm Catheter type: closed end flexible Catheter size: 19 g Catheter at skin depth: 12 cm Assessment Sensory level: T4

## 2014-12-03 NOTE — H&P (Signed)
24 y.o. [id number] @ [redacted]w[redacted]d presents with persistent headache.  She has a h/o GHTN since 26 weeks managed with labetalol  po bid.  She was admitted to antepartum on 11/8 with severe range BPs that responded to IV anti-hypertensive medication. She received a BMZ course during this admission.  She has been seen four times in the past week with complaint of headache.  Today, she c/o HA unresponsive to tylenol or fioricet and spots in vision.  She received reglan in MAU with no improvement.  She reports vomiting earlier today and now c/o RUQ pain.  Otherwise has good fetal movement and no bleeding.  Pregnancy c/b:  1. Incarcerated umbilical hernia: requiring repair via laparotomy w small bowel resection in the first trimester.  2. History of cesarean section: desires repeat 3. History of anxiety / panic attacks: restarted on zoloft  daily this pregnancy  Past Medical History  Diagnosis Date  . Pregnancy induced hypertension   . Anemia   . Obese   . Hx of varicella   . Asthma     childhood    Past Surgical History  Procedure Laterality Date  . Tonsillectomy    . Cesarean section N/A 03/09/2012    Procedure: CESAREAN SECTION;  Surgeon: Freddrick March. Tenny Craw, MD;  Location: [facility];  Service: Obstetrics;  Laterality: N/A;  . Umbilical hernia repair N/A 06/21/2014    Procedure: EXPLORATORY LAPAROTOMY,  SMALL BOWEL RESECTION, PRIMARY REPAIR INCARCERATED VENTRAL HERNIA;  Surgeon: Darnell Level, MD;  Location: [facility];  Service: General;  Laterality: N/A;    OB History  Gravida Para Term Preterm AB SAB TAB Ectopic Multiple Living  # Outcome Date GA Lbr Len/2nd Weight Sex Delivery Anes PTL Lv  5 Current           4 Term 03/09/12 [redacted]w[redacted]d   [name] EPI  Y  3 TAB 2013          2 TAB 2012          1 TAB 2011              Social History   Social History  . Marital Status: Married    Spouse Name: N/A  . Number of Children: N/A  . Years of Education: N/A   Occupational History   . Not on file.   Social History Main Topics  . Smoking status: Never Smoker   . Smokeless tobacco: Never Used  . Alcohol Use: No  . Drug Use: No  . Sexual Activity: Yes   Other Topics Concern  . Not on file   Social History Narrative   Review of patient's allergies indicates no known allergies.    Prenatal Transfer Tool  Maternal Diabetes: No Genetic Screening: Normal Maternal Ultrasounds/Referrals: Normal Fetal Ultrasounds or other Referrals:  None Maternal Substance Abuse:  No Significant Maternal Medications:  Meds include: Other:  labetalol Significant Maternal Lab Results: None   ABO, Rh: --/--/A POS (11/08 1610) Antibody: NEG (11/08 1610) Rubella: Immune RPR: Non Reactive (11/08 1610)  HBsAg: Negative (06/09 0000)  HIV: Non-reactive (06/09 0000)  GBS:   unknown     Filed Vitals:   12/03/14 1902  BP: 157/97  Pulse: 105  Temp:   Resp:      General:  NAD Abdomen:  Obese, gravid, well healed vertical midline incision.  TTP RUQ.  Ex: 1+  Edema b/l, no clonus SVE:  deferred FHTs:  140s, mod var, + accels, no decels Toco:  quiet  [facility] 7[facility] (90%), AFI 12.  A/P   24 y.o. [id number] 10484w2d presents with preeclampsia, severe by features Given persistent headache unresponsive to multiple medications, blurry vision and right upper quadrant pain, she now meets the definition of severe preeclampsia and warrants delivery.  Discussed patient with Dr. Claudean SeveranceWhitecar, MFM, who agrees that delivery is warranted this evening.  Will plan 24h magnesium postpartum.    History of cesarean delivery: desires repeat.  Discussed risks to include infection, bleeding, damage to surrounding structures (including, but not limited to, bowel, bladder, tubes, ovaries, nerves, vessels, baby), need for additional procedures, need for blood transfusion, dvt.  All questions answered.  Consent signed.   [facility]Lavetta Geier GEFFEL [employer]CLARK

## 2014-12-03 NOTE — MAU Provider Note (Signed)
Chief Complaint:  Headache and Hypertension   First Provider Initiated Contact with Patient 12/03/14 1758      Headache  This is a recurrent problem. The current episode started today. The problem occurs constantly. The problem has been unchanged. The pain is located in the bilateral and frontal region. The pain does not radiate. The pain quality is similar to prior headaches. The quality of the pain is described as aching and dull. The pain is moderate. Associated symptoms include blurred vision, nausea and a visual change. Pertinent negatives include no abdominal pain, back pain, dizziness, fever, numbness, photophobia, sinus pressure, vomiting or weakness. Nothing aggravates the symptoms. Treatments tried: Fioricet. The treatment provided no relief. Her past medical history is significant for hypertension.    Marilyn Cooper is a 24 y.o. [id number] at [redacted]w[redacted]d who presents to maternity admissions reporting Headache, unrelieved by Fioricet and seeing spots since this morning. Was instructed to take some Fioricet and come in if it does not work.  Denies upper abdominal pain or other symptoms.  Turned in 24 hr urine today in office.. She reports good fetal movement, denies LOF, vaginal bleeding, vaginal itching/burning, urinary symptoms, h/a, dizziness, n/v, or fever/chills.    RN Note: Pt C/O HA since this a.m., was seen in MD office. Pt took fioricet with no relief, has been ongoing issue. Also seeing spots this evening. Denies epigastric pain or swelling. Turned in 24-hour urine today. Denies uc's, bleeding or LOF.            Past Medical History: Past Medical History  Diagnosis Date  . Pregnancy induced hypertension   . Anemia   . Obese   . Hx of varicella   . Asthma     childhood    Past obstetric history: OB History  Gravida Para Term Preterm AB SAB TAB Ectopic Multiple Living  # Outcome Date GA Lbr Len/2nd Weight Sex Delivery Anes PTL Lv  5 Current            4 Term 03/09/12 [redacted]w[redacted]d   [name] EPI  Y  3 TAB 2013          2 TAB 2012          1 TAB 2011              Past Surgical History: Past Surgical History  Procedure Laterality Date  . Tonsillectomy    . Cesarean section N/A 03/09/2012    Procedure: CESAREAN SECTION;  Surgeon: Freddrick March. Tenny Craw, MD;  Location: [facility];  Service: Obstetrics;  Laterality: N/A;  . Umbilical hernia repair N/A 06/21/2014    Procedure: EXPLORATORY LAPAROTOMY,  SMALL BOWEL RESECTION, PRIMARY REPAIR INCARCERATED VENTRAL HERNIA;  Surgeon: Darnell Level, MD;  Location: [facility];  Service: General;  Laterality: N/A;    Family History: Family History  Problem Relation Age of Onset  . Anemia Mother   . Anemia Sister   . Birth defects Brother     heart disorder?  Marland Kitchen Hypertension Maternal Grandmother   . Diabetes Maternal Grandmother   . Hypertension Maternal Grandfather   . Diabetes Maternal Grandfather   . Hypertension Paternal Grandmother   . Hypertension Paternal Grandfather     Social History: Social History  Substance Use Topics  . Smoking status: Never Smoker   . Smokeless tobacco: Never Used  . Alcohol Use: No    Allergies: No Known Allergies  Meds:  Prescriptions  prior to admission  Medication Sig Dispense Refill Last Dose  . albuterol (PROVENTIL HFA;VENTOLIN HFA) 108 (90 BASE) MCG/ACT inhaler Inhale 2 puffs into the lungs every 4 (four) hours as needed for wheezing or shortness of breath. 1 Inhaler 1 rescue  . butalbital-acetaminophen-caffeine (FIORICET, ESGIC) 50-325-40 MG tablet Take 1 tablet by mouth every 6 (six) hours as needed for headache.   11/25/2014 at 0630  . calcium carbonate (TUMS - DOSED IN MG ELEMENTAL CALCIUM) 500 MG chewable tablet Chew 3 tablets by mouth 3 (three) times daily as needed for indigestion or heartburn.   11/25/2014 at 1300  . cetirizine (ZYRTEC) 10 MG tablet Take 10 mg by mouth daily.   11/24/2014 at Unknown time  . guaiFENesin (MUCINEX) 600 MG 12 hr tablet Take 600  mg by mouth 2 (two) times daily as needed for cough or to loosen phlegm.   11/24/2014 at Unknown time  . IRON PO Take 325 mg by mouth 3 (three) times daily.    11/25/2014 at 1300  . labetalol (NORMODYNE) 300 MG tablet Take 300 mg by mouth 2 (two) times daily.   11/25/2014 at 0630  . Prenatal Vit-Min-FA-Fish Oil ([employer] PRENATAL GUMMY PO) Take 2 each by mouth 2 (two) times daily.    11/25/2014 at 0630  . sertraline (ZOLOFT) 50 MG tablet Take 50 mg by mouth daily.   11/25/2014 at 0630    ROS:  Review of Systems  Constitutional: Negative for fever, chills and fatigue.  HENT: Negative for congestion and sinus pressure.   Eyes: Positive for blurred vision and visual disturbance. Negative for photophobia.  Respiratory: Negative for shortness of breath.   Gastrointestinal: Positive for nausea. Negative for vomiting, abdominal pain, diarrhea and constipation.  Genitourinary: Negative for flank pain, vaginal bleeding and pelvic pain.  Musculoskeletal: Negative for back pain.  Neurological: Positive for headaches. Negative for dizziness, facial asymmetry, speech difficulty, weakness and numbness.     I have reviewed patient's Past Medical Hx, Surgical Hx, Family Hx, Social Hx, medications and allergies.   Physical Exam  Patient Vitals for the past 24 hrs:  BP Temp Temp src Pulse Resp  12/03/14 1743 156/92 mmHg 97.5 F (36.4 C) Oral 111 18   Constitutional: Well-developed, well-nourished female in no acute distress.  Cardiovascular: normal rate and rhythm, no ectopic beats Respiratory: normal effort Lungs CTAB GI: Abd soft, non-tender, gravid appropriate for gestational age. No liver tenderness MS: Extremities nontender, Trace to 1+ pedal edema, normal ROM Neurologic: Alert and oriented x 4. DTRs normal with no clonus GU: Neg CVAT.    FHT:  Baseline 135 , moderate variability, accelerations present, no decelerations Contractions: irregular   Labs: --/--/A POS (11/08 1610)  Imaging:   MAU  Course/MDM:  Consult Dr Chestine Spore with presentation, history and exam findings.She recommends repeating PIH labs and serial BP measurements.   I have ordered labs and reviewed results Filed Vitals:   12/03/14 1743 12/03/14 1825  BP: 156/92 150/101  Pulse: 111 106  Temp: 97.5 F (36.4 C)   TempSrc: Oral   Resp: 18    Results for orders placed or performed during the hospital encounter of 12/03/14 (from the past 24 hour(s))  Urinalysis, Routine w reflex microscopic (not at [facility])     Status: Abnormal   Collection Time: 12/03/14  5:50 PM  Result Value Ref Range   Color, Urine YELLOW YELLOW   APPearance CLEAR CLEAR   Specific Gravity, Urine >1.030 (H) 1.005 - 1.030   pH 6.0 5.0 -  8.0   Glucose, UA NEGATIVE NEGATIVE mg/dL   Hgb urine dipstick NEGATIVE NEGATIVE   Bilirubin Urine NEGATIVE NEGATIVE   Ketones, ur NEGATIVE NEGATIVE mg/dL   Protein, ur NEGATIVE NEGATIVE mg/dL   Nitrite NEGATIVE NEGATIVE   Leukocytes, UA NEGATIVE NEGATIVE  Protein / creatinine ratio, urine     Status: Abnormal   Collection Time: 12/03/14  5:50 PM  Result Value Ref Range   Creatinine, Urine 175.00 mg/dL   Total Protein, Urine 39 mg/dL   Protein Creatinine Ratio 0.22 (H) 0.00 - 0.15 mg/mg[Cre]  CBC     Status: Abnormal   Collection Time: 12/03/14  6:11 PM  Result Value Ref Range   WBC 8.8 4.0 - 10.5 K/uL   RBC 3.99 3.87 - 5.11 MIL/uL   Hemoglobin 10.0 (L) 12.0 - 15.0 g/dL   HCT 16.1 (L) 09.6 - 04.5 %   MCV 77.9 (L) 78.0 - 100.0 fL   MCH 25.1 (L) 26.0 - 34.0 pg   MCHC 32.2 30.0 - 36.0 g/dL   RDW 40.9 (H) 81.1 - 91.4 %   Platelets 215 150 - 400 K/uL  Comprehensive metabolic panel     Status: Abnormal   Collection Time: 12/03/14  6:11 PM  Result Value Ref Range   Sodium 134 (L) 135 - 145 mmol/L   Potassium 3.8 3.5 - 5.1 mmol/L   Chloride 107 101 - 111 mmol/L   CO2 18 (L) 22 - 32 mmol/L   Glucose, Bld 129 (H) 65 - 99 mg/dL   BUN 9 6 - 20 mg/dL   Creatinine, Ser 7.82 0.44 - 1.00 mg/dL   Calcium 8.9 8.9  - 95.6 mg/dL   Total Protein 6.9 6.5 - 8.1 g/dL   Albumin 2.9 (L) 3.5 - 5.0 g/dL   AST 20 15 - 41 U/L   ALT 13 (L) 14 - 54 U/L   Alkaline Phosphatase 109 38 - 126 U/L   Total Bilirubin 0.2 (L) 0.3 - 1.2 mg/dL   GFR calc non Af Amer >60 >60 mL/min   GFR calc Af Amer >60 >60 mL/min   Anion gap 9 5 - 15   Filed Vitals:   12/03/14 1825 12/03/14 1832 12/03/14 1847 12/03/14 1902  BP: 150/101 159/91 159/91 157/97  Pulse: 106 102 110 105  Temp:      TempSrc:      Resp:       Dr Chestine Spore up dated with lab results and BP readings, as well as symptoms She recommends admission/observation   Assessment: SIUP at [redacted]w[redacted]d  Gestational Hypertension, rule out preeclampsia Unrelieved headache, RUQ pain and visual changes  Plan: Admit to Antenatal per order DR Chestine Spore    Medication List    ASK your doctor about these medications        albuterol 108 (90 BASE) MCG/ACT inhaler  Commonly known as:  PROVENTIL HFA;VENTOLIN HFA  Inhale 2 puffs into the lungs every 4 (four) hours as needed for wheezing or shortness of breath.     butalbital-acetaminophen-caffeine 50-325-40 MG tablet  Commonly known as:  FIORICET, ESGIC  Take 1 tablet by mouth every 6 (six) hours as needed for headache.     calcium carbonate 500 MG chewable tablet  Commonly known as:  TUMS - dosed in mg elemental calcium  Chew 3 tablets by mouth 3 (three) times daily as needed for indigestion or heartburn.     cetirizine 10 MG tablet  Commonly known as:  ZYRTEC  Take 10 mg by mouth daily.  [employer] PRENATAL GUMMY PO  Take 2 each by mouth 2 (two) times daily.     guaiFENesin 600 MG 12 hr tablet  Commonly known as:  MUCINEX  Take 600 mg by mouth 2 (two) times daily as needed for cough or to loosen phlegm.     IRON PO  Take 325 mg by mouth 3 (three) times daily.     labetalol 300 MG tablet  Commonly known as:  NORMODYNE  Take 300 mg by mouth 2 (two) times daily.     sertraline 50 MG tablet  Commonly known as:  ZOLOFT   Take 50 mg by mouth daily.        Wynelle BourgeoisMarie Jamice Carreno CNM, MSN Certified Nurse-Midwife 12/03/2014 6:01 PM

## 2014-12-03 NOTE — Brief Op Note (Signed)
12/03/2014  11:29 PM  PATIENT:  Marilyn Cooper  24 y.o. female  PRE-OPERATIVE DIAGNOSIS:  1.Severe preeclampsia at 3269w2d. 2. Prior cesarean section, desires repeat   POST-OPERATIVE DIAGNOSIS:  1. Severe preeclampsia at 3569w2d  2. Prior cesarean section, desires repeat   PROCEDURE:  Procedure(s): CESAREAN SECTION (N/A)  SURGEON:  Surgeon(s) and Role:    * Marlow Baarsyanna Danasha Melman, MD - Primary  ANESTHESIA:   CSE  EBL:  Total I/O In: 600 [I.V.:600] Out: 730 [Urine:130; Blood:600]  BLOOD ADMINISTERED:none  DRAINS: none   LOCAL MEDICATIONS USED:  NONE  SPECIMEN:  Source of Specimen:  placenta  DISPOSITION OF SPECIMEN:  PATHOLOGY  COUNTS:  YES  TOURNIQUET:  * No tourniquets in log *  DICTATION: .Note written in EPIC  PLAN OF CARE: Admit to inpatient   PATIENT DISPOSITION:  PACU - hemodynamically stable.   Delay start of Pharmacological VTE agent (>24hrs) due to surgical blood loss or risk of bleeding: not applicable

## 2014-12-03 NOTE — Anesthesia Preprocedure Evaluation (Deleted)
Anesthesia Evaluation  Patient identified by MRN, date of birth, ID band Patient awake    Reviewed: Allergy & Precautions, H&P , NPO status , Patient's Chart, lab work & pertinent test results  Airway Mallampati: II  TM Distance: >3 FB Neck ROM: full    Dental no notable dental hx.    Pulmonary    Pulmonary exam normal        Cardiovascular hypertension, Pt. on medications Normal cardiovascular exam     Neuro/Psych negative neurological ROS     GI/Hepatic negative GI ROS, Neg liver ROS,   Endo/Other  Morbid obesity  Renal/GU negative Renal ROS     Musculoskeletal   Abdominal (+) + obese,   Peds  Hematology   Anesthesia Other Findings   Reproductive/Obstetrics (+) Pregnancy                             Anesthesia Physical Anesthesia Plan  ASA: III  Anesthesia Plan: Combined Spinal and Epidural   Post-op Pain Management:    Induction:   Airway Management Planned:   Additional Equipment:   Intra-op Plan:   Post-operative Plan:   Informed Consent: I have reviewed the patients History and Physical, chart, labs and discussed the procedure including the risks, benefits and alternatives for the proposed anesthesia with the patient or authorized representative who has indicated his/her understanding and acceptance.     Plan Discussed with: CRNA and Surgeon  Anesthesia Plan Comments:         Anesthesia Quick Evaluation

## 2014-12-03 NOTE — Anesthesia Preprocedure Evaluation (Addendum)
Anesthesia Evaluation  Patient identified by MRN, date of birth, ID band Patient awake    Reviewed: Allergy & Precautions, H&P , NPO status , Patient's Chart, lab work & pertinent test results  Airway Mallampati: III  TM Distance: >3 FB Neck ROM: full    Dental no notable dental hx.    Pulmonary neg pulmonary ROS,    Pulmonary exam normal        Cardiovascular hypertension, Pt. on home beta blockers and Pt. on medications Normal cardiovascular exam     Neuro/Psych negative neurological ROS     GI/Hepatic negative GI ROS, Neg liver ROS,   Endo/Other  Morbid obesity  Renal/GU negative Renal ROS     Musculoskeletal   Abdominal (+) + obese,   Peds  Hematology   Anesthesia Other Findings   Reproductive/Obstetrics (+) Pregnancy                             Anesthesia Physical Anesthesia Plan  ASA: III  Anesthesia Plan: Combined Spinal and Epidural   Post-op Pain Management:    Induction:   Airway Management Planned:   Additional Equipment:   Intra-op Plan:   Post-operative Plan:   Informed Consent: I have reviewed the patients History and Physical, chart, labs and discussed the procedure including the risks, benefits and alternatives for the proposed anesthesia with the patient or authorized representative who has indicated his/her understanding and acceptance.     Plan Discussed with: CRNA and Surgeon  Anesthesia Plan Comments:         Anesthesia Quick Evaluation

## 2014-12-03 NOTE — Op Note (Signed)
Cesarean Section Procedure Note  Pre-operative Diagnosis: 1. Intrauterine pregnancy at [redacted]w[redacted]d  2. Sev1ere preeclampsia 3. Prior cesarean section, desires repeat   Post-operative Diagnosis: 1. Intrauterine pregnancy at 6848w2d  2. Severe preeclampsia 3. Prior cesarean section, desires repeat   Surgeon: Marlow Baarsyanna Deshonna Trnka, MD  Procedure: Repeat low transverse cesarean section   Anesthesia: Continuous spinal epidural anesthesia  Estimated Blood Loss: 600 mL         Drains: Foley catheter         Specimens: placenta to pathology         Implants: none         Complications:  None; patient tolerated the procedure well.         Disposition: PACU - hemodynamically stable.  Findings:  Normal uterus, tubes and ovaries bilaterally.  Viable female infant, weight pending Apgars 8, 9.    Procedure Details   After continuous spinal epidural anesthesia was found to adequate , the patient was placed in the dorsal supine position with a leftward tilt, draped and prepped in the usual sterile manner. A Traxi retractor was placed on the patient's abdomen to assist with retracting the pannus.  A Pfannenstiel incision was made through the prior scar and carried down through the subcutaneous tissue to the fascia. The fascia was incised in the midline and the fascial incision was extended laterally with Mayo scissors. The superior aspect of the fascial incision was grasped with two Kocher clamp, tented up and the rectus muscles dissected off sharply. The rectus was then dissected off with blunt dissection and Mayo scissors inferiorly. The rectus muscles were separated in the midline. The abdominal peritoneum was identified, tented up, entered sharply, and the incision was extended superiorly and inferiorly with good visualization of the bladder. The Alexis retractor was deployed.  The uterus was noted to be rotated greater than 90 degrees, with the left ovarian venous plexus and left fallopian tube in the midline.  The  patient was tilted to her left and the uterus manipulated so that the anterior aspect of the uterus was visible.   The vesicouterine peritoneum was identified, tented up, entered sharply, and the bladder flap was created digitally. Scalpel was then used to make a low transverse incision on the uterus which was extended in the cephalad-caudad direction with blunt dissection. The fluid was clear. The fetal vertex was identified, elevated out of the pelvis and brought to the hysterotomy. The head was delivered easily followed by the shoulders and body. The cord was clamped and cut and the infant was passed to the waiting neonatologist. Placenta was then delivered spontaneously, intact and appear normal, the uterus was cleared of all clot and debris   The hysterotomy was repaired with #0 Monocryl in running locked fashion.  At this time, the patient had significant nausea and vomiting due to anesthesia, leading to increased intra-abdominal pressure and the bowel interfering with the procedure. The bowel was kept covered with wet laps.   A 10 minute delay in operative time was encountered until nausea was resolved, the bowel was returned to the abdomen, and the procedure could continue. The uterus was examined and the hysterotomy noted to be hemostatic. The Alexis retractor was removed from the abdomen. The peritoneum was examined and all vessels noted to be hemostatic. The abdominal cavity was cleared of all clot and debris.  The peritoneum was closed with 2-0 vicryl in a running fashion. The fascia and rectus muscles were inspected and were hemostatic. The fascia was closed  with 0 Vicryl in a running fashion. The subcuticular layer was irrigated and all bleeders cauterized.  The subcutaneous layer was re approximated with interrupted 3-0 plain gut.  The skin was closed with 3-0 monocryl in a subcuticular fashion. The incision was dressed with a PICO wound vac. All sponge lap and needle counts were correct x3.  Patient tolerated the procedure well and recovered in stable condition following the procedure.

## 2014-12-03 NOTE — MAU Note (Signed)
Pt C/O HA since this a.m., was seen in MD office.  Pt took fioricet with no relief, has been ongoing issue.  Also seeing spots this evening.  Denies epigastric pain or swelling.  Turned in 24-hour urine today.   Denies uc's, bleeding or LOF.

## 2014-12-03 NOTE — Transfer of Care (Signed)
Immediate Anesthesia Transfer of Care Note  Patient: Marilyn Cooper  Procedure(s) Performed: Procedure(s): CESAREAN SECTION (N/A)  Patient Location: PACU  Anesthesia Type:Spinal and Epidural  Level of Consciousness: awake, alert  and oriented  Airway & Oxygen Therapy: Patient Spontanous Breathing  Post-op Assessment: Report given to RN and Post -op Vital signs reviewed and stable  Post vital signs: Reviewed and stable  Last Vitals:  Filed Vitals:   12/03/14 2155  BP: 169/92  Pulse: 98  Temp:   Resp:     Complications: No apparent anesthesia complications

## 2014-12-03 NOTE — Progress Notes (Signed)
Prior to cesarean section, BPs were 160-170s/90s-100s.  At the end of the procedure, BPs 110-130s/70-80s.   Will hold labetalol 300 mg PO bid at this time, restart as needed Plan for 24 hr magnesium postpartum

## 2014-12-03 NOTE — Consult Note (Signed)
Neonatology Note:   Attendance at C-section:   I was asked by Dr. Clark to attend this repeat C/S at 36 2/7 weeks due to severe maternal pre-eclampsia. The mother is a G5P1A3 A pos, GBS unknown with pre-eclampsia, on Labetalol, and anxiety, on Zoloft. She got Betamethasone on 11/8-9. ROM at delivery, fluid clear. Infant vigorous with good spontaneous cry and tone. Needed only minimal bulb suctioning. Ap 8/9. Lungs clear to ausc in DR. To CN to care of Pediatrician.  Callee Rohrig C. Bethanie Bloxom, MD 

## 2014-12-04 ENCOUNTER — Encounter (HOSPITAL_COMMUNITY): Payer: Self-pay | Admitting: *Deleted

## 2014-12-04 LAB — RPR: RPR Ser Ql: NONREACTIVE

## 2014-12-04 LAB — CBC
HEMATOCRIT: 27.6 % — AB (ref 36.0–46.0)
HEMOGLOBIN: 9.1 g/dL — AB (ref 12.0–15.0)
MCH: 25.6 pg — ABNORMAL LOW (ref 26.0–34.0)
MCHC: 33 g/dL (ref 30.0–36.0)
MCV: 77.7 fL — AB (ref 78.0–100.0)
Platelets: 205 10*3/uL (ref 150–400)
RBC: 3.55 MIL/uL — AB (ref 3.87–5.11)
RDW: 15.7 % — ABNORMAL HIGH (ref 11.5–15.5)
WBC: 11.3 10*3/uL — AB (ref 4.0–10.5)

## 2014-12-04 MED ORDER — DIBUCAINE 1 % RE OINT: 1.0000 "application " | TOPICAL_OINTMENT | RECTAL | Status: DC | PRN

## 2014-12-04 MED ORDER — ALBUTEROL SULFATE (2.5 MG/3ML) 0.083% IN NEBU: 3.0000 mL | INHALATION_SOLUTION | RESPIRATORY_TRACT | Status: DC | PRN

## 2014-12-04 MED ORDER — MAGNESIUM SULFATE 50 % IJ SOLN
2.0000 g/h | INTRAVENOUS | Status: DC
  Administered 2014-12-04 (×2): 2 g/h via INTRAVENOUS
  Filled 2014-12-04 (×2): qty 80

## 2014-12-04 MED ORDER — DIPHENHYDRAMINE HCL 25 MG PO CAPS: 25.0000 mg | ORAL_CAPSULE | Freq: Four times a day (QID) | ORAL | Status: DC | PRN

## 2014-12-04 MED ORDER — OXYTOCIN 40 UNITS IN LACTATED RINGERS INFUSION - SIMPLE MED
62.5000 mL/h | INTRAVENOUS | Status: AC
  Administered 2014-12-04: 62.5 mL/h via INTRAVENOUS
  Filled 2014-12-04: qty 1000

## 2014-12-04 MED ORDER — WITCH HAZEL-GLYCERIN EX PADS: 1.0000 "application " | MEDICATED_PAD | CUTANEOUS | Status: DC | PRN

## 2014-12-04 MED ORDER — SERTRALINE HCL 50 MG PO TABS
50.0000 mg | ORAL_TABLET | Freq: Every day | ORAL | Status: DC
  Administered 2014-12-04 – 2014-12-06 (×3): 50 mg via ORAL
  Filled 2014-12-04 (×3): qty 1

## 2014-12-04 MED ORDER — OXYCODONE-ACETAMINOPHEN 5-325 MG PO TABS
1.0000 | ORAL_TABLET | ORAL | Status: DC | PRN
  Administered 2014-12-05 – 2014-12-06 (×5): 1 via ORAL
  Filled 2014-12-04 (×5): qty 1

## 2014-12-04 MED ORDER — SIMETHICONE 80 MG PO CHEW
80.0000 mg | CHEWABLE_TABLET | Freq: Three times a day (TID) | ORAL | Status: DC
  Administered 2014-12-04 – 2014-12-07 (×11): 80 mg via ORAL
  Filled 2014-12-04 (×11): qty 1

## 2014-12-04 MED ORDER — TETANUS-DIPHTH-ACELL PERTUSSIS 5-2.5-18.5 LF-MCG/0.5 IM SUSP
0.5000 mL | Freq: Once | INTRAMUSCULAR | Status: AC
  Administered 2014-12-05: 0.5 mL via INTRAMUSCULAR
  Filled 2014-12-04 (×2): qty 0.5

## 2014-12-04 MED ORDER — LANOLIN HYDROUS EX OINT: 1.0000 "application " | TOPICAL_OINTMENT | CUTANEOUS | Status: DC | PRN

## 2014-12-04 MED ORDER — SENNOSIDES-DOCUSATE SODIUM 8.6-50 MG PO TABS
2.0000 | ORAL_TABLET | ORAL | Status: DC
  Administered 2014-12-04 – 2014-12-05 (×3): 2 via ORAL
  Filled 2014-12-04 (×3): qty 2

## 2014-12-04 MED ORDER — BUPIVACAINE IN DEXTROSE 0.75-8.25 % IT SOLN
INTRATHECAL | Status: DC | PRN
  Administered 2014-12-03: 1 mL via INTRATHECAL

## 2014-12-04 MED ORDER — LACTATED RINGERS IV SOLN
INTRAVENOUS | Status: DC
  Administered 2014-12-04: 17:00:00 via INTRAVENOUS

## 2014-12-04 MED ORDER — PRENATAL MULTIVITAMIN CH
1.0000 | ORAL_TABLET | Freq: Every day | ORAL | Status: DC
  Administered 2014-12-04 – 2014-12-07 (×4): 1 via ORAL
  Filled 2014-12-04 (×4): qty 1

## 2014-12-04 MED ORDER — SIMETHICONE 80 MG PO CHEW: 80.0000 mg | CHEWABLE_TABLET | ORAL | Status: DC | PRN

## 2014-12-04 MED ORDER — OXYCODONE-ACETAMINOPHEN 5-325 MG PO TABS
2.0000 | ORAL_TABLET | ORAL | Status: DC | PRN
  Administered 2014-12-05 – 2014-12-06 (×3): 2 via ORAL
  Filled 2014-12-04 (×3): qty 2

## 2014-12-04 MED ORDER — SIMETHICONE 80 MG PO CHEW
80.0000 mg | CHEWABLE_TABLET | ORAL | Status: DC
  Administered 2014-12-04 – 2014-12-05 (×3): 80 mg via ORAL
  Filled 2014-12-04 (×3): qty 1

## 2014-12-04 MED ORDER — ACETAMINOPHEN 325 MG PO TABS: 650.0000 mg | ORAL_TABLET | ORAL | Status: DC | PRN

## 2014-12-04 MED ORDER — IBUPROFEN 600 MG PO TABS
600.0000 mg | ORAL_TABLET | Freq: Four times a day (QID) | ORAL | Status: DC
  Administered 2014-12-04 – 2014-12-07 (×14): 600 mg via ORAL
  Filled 2014-12-04 (×14): qty 1

## 2014-12-04 MED ORDER — MENTHOL 3 MG MT LOZG
1.0000 | LOZENGE | OROMUCOSAL | Status: DC | PRN
  Filled 2014-12-04 (×2): qty 9

## 2014-12-04 NOTE — Progress Notes (Signed)
MOB was referred for history of depression/anxiety.  Referral is screened out by Clinical Social Worker because none of the following criteria appear to apply: -History of anxiety/depression during this pregnancy, or of post-partum depression. - Diagnosis of anxiety and/or depression within last 3 years - History of depression due to pregnancy loss/loss of child or -MOB's symptoms are currently being treated with medication and/or therapy. MOB is currently prescribed Zoloft.   Please contact the Clinical Social Worker if needs arise or upon MOB request.  

## 2014-12-04 NOTE — Anesthesia Postprocedure Evaluation (Signed)
Anesthesia Post Note  Patient: Marilyn Cooper  Procedure(s) Performed: Procedure(s) (LRB): CESAREAN SECTION (N/A)  Anesthesia type: Spinal  Patient location: PACU  Post pain: Pain level controlled  Post assessment: Post-op Vital signs reviewed  Last Vitals:  Filed Vitals:   12/04/14 0115  BP: 113/61  Pulse: 96  Temp:   Resp: 24    Post vital signs: Reviewed  Level of consciousness: awake  Complications: No apparent anesthesia complications

## 2014-12-04 NOTE — Lactation Note (Signed)
This note was copied from the chart of Marilyn Britney Edberg. Lactation Consultation Note Experienced BF mom of a few months. Mom has LPI , LGA. Mom has tubular breast w/everted nipples. Breast has multiple sores d/t mom states she has some kind of condition of her sweat glands over producing and contantly getting inflamed. Mom has a band aide on one. States she had this problem since she was 89 yrs old. Asked mom to show to OB and ask for ABT ointment since some has pustules. Encouraged mom to cover the inflamed glands that has pustules.  Mom encouraged to feed baby 8-12 times/24 hours and with feeding cues. Mom encouraged to waken baby for feeds.  Encouraged to call for assistance if needed and to verify proper latch. Mom shown how to use DEBP & how to disassemble, clean, & reassemble parts, by RN. Mom knows to pump q3h for 15-20 min. Hand expression taught to Mom, collected 4 ml. Educated about newborn behavior of LPI, cluster feeding, I&O, supply and demand. Encouraged comfort during BF so colostrum flows better and mom will enjoy the feeding longer. Taking deep breaths and breast massage during BF. WH/LC brochure given w/resources, support groups and LC services.   Patient Name: Marilyn Cooper Reason for consult: Initial assessment   Maternal Data Has patient been taught Hand Expression?: Yes Does the patient have breastfeeding experience prior to this delivery?: Yes  Feeding Feeding Type: Formula  LATCH Score/Interventions Intervention(s): Breast massage;Breast compression  Intervention(s): Hand expression;Skin to skin;Alternate breast massage  Type of Nipple: Everted at rest and after stimulation  Comfort (Breast/Nipple): Soft / non-tender           Lactation Tools Discussed/Used WIC Program: No   Consult Status Consult Status: Follow-up Date: 12/05/14 Follow-up type: In-patient    Yecenia Dalgleish, Diamond NickelLAURA G Cooper, 8:02 AM

## 2014-12-04 NOTE — Progress Notes (Signed)
POD#1 Pt without complaints. She states that her headache has resolved. B/Ps are wnl since delivery without labetalol. She is not diuresing however she never had significant edema IMP/ stable Plan/ Continue MgSO4

## 2014-12-05 ENCOUNTER — Inpatient Hospital Stay (HOSPITAL_COMMUNITY)
Admission: RE | Admit: 2014-12-05 | Discharge: 2014-12-05 | Disposition: A | Discharge status: Home / Self Care | Payer: Medicaid Other | Source: Ambulatory Visit

## 2014-12-05 ENCOUNTER — Encounter (HOSPITAL_COMMUNITY): Payer: Self-pay | Admitting: Obstetrics

## 2014-12-05 MED ORDER — LABETALOL HCL 200 MG PO TABS
300.0000 mg | ORAL_TABLET | Freq: Once | ORAL | Status: AC
  Administered 2014-12-05: 300 mg via ORAL

## 2014-12-05 MED ORDER — LABETALOL HCL 200 MG PO TABS
300.0000 mg | ORAL_TABLET | Freq: Two times a day (BID) | ORAL | Status: DC
  Administered 2014-12-05: 300 mg via ORAL
  Filled 2014-12-05: qty 1

## 2014-12-05 MED ORDER — LABETALOL HCL 200 MG PO TABS
300.0000 mg | ORAL_TABLET | Freq: Three times a day (TID) | ORAL | Status: DC
  Administered 2014-12-06 – 2014-12-07 (×6): 300 mg via ORAL
  Filled 2014-12-05 (×8): qty 1

## 2014-12-05 NOTE — Progress Notes (Signed)
Patient is eating, ambulating, voiding.  Pain control is good. + flatus.  Appropriate lochia.  No complaints- no HA, vision change, RUQ pain.  Filed Vitals:   12/05/14 0248 12/05/14 0603 12/05/14 0808 12/05/14 0906  BP: 151/82 151/73 169/91 153/85  Pulse: 92 84 87 86  Temp:   97.9 F (36.6 C)   TempSrc:   Oral   Resp: 20 20 20    Height:      Weight:      SpO2:   99%     Fundus firm Perineum without swelling. Inc: c/d/i Ext: no CT  Lab Results  Component Value Date   WBC 11.3* 12/04/2014   HGB 9.1* 12/04/2014   HCT 27.6* 12/04/2014   MCV 77.7* 12/04/2014   PLT 205 12/04/2014    --/--/A POS (11/16 1811)  A/P Postop day #2 s/p c/s st 36 weeks for severe range BPs S/p MgSO4 Severe range BP this am, labetalol 300 bid restarted Continue other routine care   ElliottALLAHAN, [facility]

## 2014-12-05 NOTE — Progress Notes (Signed)
Pt BP 164/85, HR 92.  Pt was woken from a nap to take her vitals and currently denies symptoms.  Pt states that prior to nap she did see some "yellow lines" in her vision but it resolved with a nap.  MD on call, Dr. Claiborne Billingsallahan, notified at 1900 and ordered Labetalol to be changed from BID to TID and to go ahead and give the Labetalol now.

## 2014-12-05 NOTE — Lactation Note (Addendum)
This note was copied from the chart of Marilyn Marlen Pederson. Lactation Consultation Note  Patient Name: Marilyn Dinah BeersHaylee Meske [id number]'s Date: 12/05/2014 Reason for consult: Follow-up assessment   Follow up with mom of 35 hour old infant in antenatal. Mom is moving to room 111 today. Infant born at 36w 2d and weighed 7 lb 15.7 oz at birth, weight today is 7 lb 10.4 oz with a 4% weight loss. Mom reports that infant latched on and BF for 15 minutes this morning. She said infant was still nursing, but she removed her. Mom denies nipple tenderness. Infant had just finished a bottle when I went into the room. Mom is pumping and getting small volumes that she is feeding to the baby before formula feedings. She has had 6 bottles of formula of 8-15cc, 3 bottles EBM of 0.5-5 cc, Breast x 1 for 15 minutes, 1 BF attempt, 8 voids and 1 stool in last 2 hours. LPT infant policy handout given to mom with supplementation amounts discussed. Mom is pleased that infant is nursing well this morning. Enc mom to BF first followed by supplementation of EBM/formula per guidelines, Pumping for 15 minutes on Initiation mode followed by hand expression. Mom reports she is hand expressing to get infant latched. Mom reports she has a [employer] DEBP at home that she used for her son and feels it works great, will need to reevaluate pump need at D/C. Enc mom to call with questions/concerns.   Maternal Data Formula Feeding for Exclusion: No Does the patient have breastfeeding experience prior to this delivery?: Yes  Feeding Feeding Type: Breast Fed Nipple Type: Slow - flow Length of feed: 15 min  LATCH Score/Interventions                      Lactation Tools Discussed/Used     Consult Status Consult Status: Follow-up Date: 12/06/14 Follow-up type: In-patient    Marilyn Cooper 12/05/2014, 10:28 AM

## 2014-12-06 ENCOUNTER — Encounter (HOSPITAL_COMMUNITY): Payer: Self-pay | Admitting: *Deleted

## 2014-12-06 MED ORDER — SERTRALINE HCL 100 MG PO TABS
100.0000 mg | ORAL_TABLET | Freq: Every day | ORAL | Status: DC
  Administered 2014-12-07: 100 mg via ORAL
  Filled 2014-12-06 (×2): qty 1

## 2014-12-06 NOTE — Progress Notes (Addendum)
Rn called Dr. Claiborne Billingsallahan regarding Bp of 159/87 at 2144. Then Rn called Dr Claiborne Billingsallahan again regarding BP of 167/86. No other signs of PIH. Dr. Claiborne Billingsallahan said to check BP in 1 hour if equal to or greater than 160(systolic)/ or equal to or greater than 105 (diastolic) and  give 100 of labetalol x1. Dr. Claiborne Billingsallahan will decide whether to increase dose in the am.

## 2014-12-06 NOTE — Progress Notes (Signed)
Subjective: Postpartum Day 3: Cesarean Delivery Patient reports pain controlled, tolerating po, ambulating and voiding without difficulty. Pt with increased stress, feeling more emotionally labile. BPs more elevated yesterday. Requesting to postpone discharge until tomorrow  Objective: Vital signs in last 24 hours: Temp:  [97.7 F (36.5 C)-98.2 F (36.8 C)] 97.9 F (36.6 C) (11/19 0553) Pulse Rate:  [85-96] 91 (11/19 1053) Resp:  [18-20] 18 (11/19 1053) BP: (136-167)/(77-95) 155/87 mmHg (11/19 1053) SpO2:  [100 %] 100 % (11/18 1850)  Physical Exam:  General: alert, cooperative and appears stated age Lochia: appropriate Uterine Fundus: firm Incision: healing well, wound vac in place DVT Evaluation: No evidence of DVT seen on physical exam.   Recent Labs  12/03/14 1811 12/04/14 0530  HGB 10.0* 9.1*  HCT 31.1* 27.6*    Assessment/Plan: Status post Cesarean section. Doing well postoperatively.  Continue current care. H/O depression, pt on zoloft 50 mg. Had been on Zoloft 100 mg previously that she took in divided doses. Pt would like to increase back to 100mg  again. Increased stress d/t mother-in-law in hospice. Once she's discharged she's concerned about feeling overwhelmed. Will keep for one more day BPs more elevated yesterday. Labetalol increased to 300mg  TID Dailyn Kempner H. 12/06/2014, 12:01 PM

## 2014-12-07 LAB — COMPREHENSIVE METABOLIC PANEL
ALBUMIN: 2.4 g/dL — AB (ref 3.5–5.0)
ALT: 23 U/L (ref 14–54)
AST: 28 U/L (ref 15–41)
Alkaline Phosphatase: 91 U/L (ref 38–126)
Anion gap: 7 (ref 5–15)
BUN: 10 mg/dL (ref 6–20)
CHLORIDE: 107 mmol/L (ref 101–111)
CO2: 24 mmol/L (ref 22–32)
Calcium: 8.6 mg/dL — ABNORMAL LOW (ref 8.9–10.3)
Creatinine, Ser: 0.51 mg/dL (ref 0.44–1.00)
GFR calc Af Amer: >60 mL/min (ref >60)
GFR calc non Af Amer: >60 mL/min (ref >60)
GLUCOSE: 105 mg/dL — AB (ref 65–99)
Potassium: 3.9 mmol/L (ref 3.5–5.1)
SODIUM: 138 mmol/L (ref 135–145)
Total Bilirubin: 0.3 mg/dL (ref 0.3–1.2)
Total Protein: 6.3 g/dL — ABNORMAL LOW (ref 6.5–8.1)

## 2014-12-07 LAB — CBC
HEMATOCRIT: 25.3 % — AB (ref 36.0–46.0)
Hemoglobin: 8 g/dL — ABNORMAL LOW (ref 12.0–15.0)
MCH: 25.2 pg — ABNORMAL LOW (ref 26.0–34.0)
MCHC: 31.6 g/dL (ref 30.0–36.0)
MCV: 79.8 fL (ref 78.0–100.0)
Platelets: 173 10*3/uL (ref 150–400)
RBC: 3.17 MIL/uL — ABNORMAL LOW (ref 3.87–5.11)
RDW: 15.9 % — AB (ref 11.5–15.5)
WBC: 5.5 10*3/uL (ref 4.0–10.5)

## 2014-12-07 MED ORDER — LABETALOL HCL 200 MG PO TABS
200.0000 mg | ORAL_TABLET | Freq: Once | ORAL | Status: AC
  Administered 2014-12-07: 200 mg via ORAL
  Filled 2014-12-07: qty 1

## 2014-12-07 MED ORDER — LABETALOL HCL 200 MG PO TABS: 400.0000 mg | ORAL_TABLET | Freq: Three times a day (TID) | ORAL | Status: DC

## 2014-12-07 NOTE — Progress Notes (Signed)
PIH labs wnl. B/P improved . Will discharge to home and recheck on tues in the office.

## 2014-12-07 NOTE — Lactation Note (Signed)
This note was copied from the chart of Marilyn Tyronda Fiorillo. Lactation Consultation Note  Patient Name: Marilyn Cooper [id number]'s Date: 12/07/2014 Reason for consult: Follow-up assessment;Late preterm infant  Baby 5884 hours old, LPI. Mom states that baby is nursing/latching well. Mom reports baby supplemented 40 ml about 45 minutes prior to this LC's visit, and baby is sound asleep in crib. Mom states that she is post-pumping after each feeding in order to have EBM for next feeding as needed. Mom states that she has DEBP at home and her plan is to put baby to breast first with each feeding, then supplement with EBM, and the postpump. Mom has over an ounce of EBM at bedside prepared for next feeding. Enc mom to keep pumping until she is sure baby able to be satisfied at breast. Mom aware of OP/BFSG and LC phone line assistance after D/C. Maternal Data    Feeding Feeding Type: Bottle Fed - Breast Milk Length of feed: 5 min  LATCH Score/Interventions                      Lactation Tools Discussed/Used     Consult Status Consult Status: PRN    Geralynn OchsWILLIARD, Natalie Mceuen 12/07/2014, 11:01 AM

## 2014-12-07 NOTE — Progress Notes (Signed)
POD#4 Pt has no complaints. Her headaches have resolved. She is still having HTN that is not adequately controlled. Hesitant to switch to Procardia because she is susceptible to headaches. Will give one dose of labetalol 200mg  now and increase dose to 400 TID. She has minimal edema. Will recheck PIH labs and if wnl will discharge on higher dose and manage as an outpt.

## 2014-12-07 NOTE — Discharge Summary (Signed)
Obstetric Discharge Summary Reason for Admission: cesarean section Prenatal Procedures: NST and ultrasound Intrapartum Procedures: cesarean: low cervical, transverse Postpartum Procedures: none Complications-Operative and Postpartum: none HEMOGLOBIN  Date Value Ref Range Status  12/07/2014 8.0* 12.0 - 15.0 g/dL Final  62/13/086509/07/2014 78.410.2 g/dL Final   HCT  Date Value Ref Range Status  12/07/2014 25.3* 36.0 - 46.0 % Final  09/24/2014 32 % Final    Physical Exam:  General: alert Lochia: appropriate Uterine Fundus: firm Incision: healing well DVT Evaluation: No evidence of DVT seen on physical exam.  Discharge Diagnoses: Term Pregnancy-delivered and Chronic HTN with superimposed preeclampsia  Discharge Information: Date: 12/07/2014 Activity: pelvic rest Diet: routine Medications: PNV, Ibuprofen, Percocet and labetalol Condition: stable and improved Instructions: refer to practice specific booklet Discharge to: home Follow-up Information    Follow up with Levi AlandANDERSON,Micayla Brathwaite E, MD. Schedule an appointment as soon as possible for a visit in 2 days.   Specialty:  Obstetrics and Gynecology   Contact information:   [address] [place] [id number] [phone]       Newborn Data: Live born female  Birth Weight: 7 lb 15.7 oz (3620 g) APGAR: 8, 9  Home with mother.  Jamar Casagrande E 12/07/2014, 1:25 PM

## 2014-12-07 NOTE — Progress Notes (Addendum)
Patient's BP's have been high since June 2016 with this pregnancy. Patient was to be discharged yesterday, but MD decided to monitor blood pressures another day.  Today, prior to discharging patient to home, Dr Dareen PianoAnderson gave an additional one time dose of 200 mg of labetalol and then increased the TID dose to 400 mg (up from 300 mg). Patient continually updated per plan of care during this shift and yesterday.  Patient understands the warning signs of PIH and eclampsia, where sheet has been given and reviewed.  Patient able to teach back sheet on high BP signs and symptoms. Patient's husband is a IT sales professionalfirefighter and is able to monitor BP's at home. Patient states she is comfortable with discharge and understands when to call MD. House coverage notified by this RN regarding high BP's and discussed patient's history and current plan of care. Patient has negative pressure dressing (PICO) dressing in place. Dr Dareen PianoAnderson called by this RN to confirm that dressing is to be taken off at office when patient is seen on Tuesday. Patient instructed that PICO is to remain in place and green light to be checked every 8 hours until dressing is removed by OB/GYN office staff. Patient verbalizes follow up clearly and states no questions.

## 2014-12-08 ENCOUNTER — Inpatient Hospital Stay (HOSPITAL_COMMUNITY): Admission: RE | Admit: 2014-12-08 | Payer: 59 | Source: Ambulatory Visit | Admitting: Obstetrics

## 2014-12-08 ENCOUNTER — Encounter (HOSPITAL_COMMUNITY): Admission: RE | Payer: Self-pay | Source: Ambulatory Visit

## 2014-12-08 SURGERY — Surgical Case
Anesthesia: Regional

## 2015-08-03 ENCOUNTER — Encounter: Payer: Self-pay | Admitting: Physician Assistant

## 2015-08-03 ENCOUNTER — Ambulatory Visit (INDEPENDENT_AMBULATORY_CARE_PROVIDER_SITE_OTHER): Payer: Medicaid Other | Admitting: Physician Assistant

## 2015-08-03 VITALS — BP 140/90 | Temp 98.0°F | Wt 287.0 lb

## 2015-08-03 DIAGNOSIS — I10 Essential (primary) hypertension: Secondary | ICD-10-CM | POA: Diagnosis not present

## 2015-08-03 DIAGNOSIS — F411 Generalized anxiety disorder: Secondary | ICD-10-CM | POA: Diagnosis not present

## 2015-08-03 DIAGNOSIS — F410 Panic disorder [episodic paroxysmal anxiety] without agoraphobia: Secondary | ICD-10-CM | POA: Diagnosis not present

## 2015-08-03 HISTORY — DX: Panic disorder (episodic paroxysmal anxiety): F41.0

## 2015-08-03 MED ORDER — CLONAZEPAM 0.5 MG PO TABS: 0.5000 mg | ORAL_TABLET | Freq: Every day | ORAL | Status: DC | PRN

## 2015-08-03 MED ORDER — SERTRALINE HCL 50 MG PO TABS: ORAL_TABLET | ORAL | Status: DC

## 2015-08-03 NOTE — Progress Notes (Signed)
Patient ID: Marilyn Cooper MRN: [id number], DOB: 07-08-1990, 25 y.o. Date of Encounter: @  Chief Complaint:  Chief Complaint  Patient presents with  . OTHER    medication management    HPI: 25 y.o. year old white female  presents with above.   Hypertension: Patient had a C-section 12/03/2014. She reports that with a prior pregnancy (prior to the 11/2014 pregnancy) she had high blood pressure during the last 4 weeks of pregnancy but never required medicine--says that they just monitored that. After delivery 2 hours later her blood pressure was normal. Says that with this most recent pregnancy--- at about 20 weeks--- her blood pressure increased. She was treated with labetalol. She says that at follow-up visit with her gynecologist after delivery of the baby says it was either the end of November or the beginning of December-- that they stopped the labetalol and started enalapril. Taking enalapril 10 mg daily since then.  Anxiety disorder/panic disorder: She states that when she became pregnant with this most recent pregnancy she had stopped medications prior to the pregnancy. Was not taking Zoloft at that time. Says that during her pregnancy her mother-in-law had cancer and she was having to care for her. Says that she died 2 months ago. Says that she was caring for her mother-in-law at about the same time as her blood pressure went up so the doctor started her on Zoloft--- feeling that the stress and anxiety caring for the mother-in-law was affecting her. Patient states that she is still taking the Zoloft. However, she says that she doesn't think that it is helping at all. Say she feels the same as she did at the first half of her pregnancy when she was off of the medication. Says that she does not feel like she has any generalized anxiety issues right now. Says that she does feel panic at times. Says this happens 1-3 times per week. Says that in the past Klonopin worked very well for  that.  No other complaints or concerns.   Past Medical History  Diagnosis Date  . Pregnancy induced hypertension   . Anemia   . Obese   . Hx of varicella   . Asthma     childhood  . Anxiety   . Depression     see social work consult     Home Meds: Outpatient Prescriptions Prior to Visit  Medication Sig Dispense Refill  . albuterol (PROVENTIL HFA;VENTOLIN HFA) 108 (90 BASE) MCG/ACT inhaler Inhale 2 puffs into the lungs every 4 (four) hours as needed for wheezing or shortness of breath. 1 Inhaler 1  . IRON PO Take 325 mg by mouth 3 (three) times daily.     . Prenatal Vit-Min-FA-Fish Oil ([employer] PRENATAL GUMMY PO) Take 2 each by mouth 2 (two) times daily. Reported on 08/03/2015    . labetalol (NORMODYNE) 200 MG tablet Take 2 tablets (400 mg total) by mouth 3 (three) times daily. (Patient not taking: Reported on 08/03/2015) 100 tablet 0   No facility-administered medications prior to visit.    Allergies: No Known Allergies  Social History   Social History  . Marital Status: Married    Spouse Name: N/A  . Number of Children: N/A  . Years of Education: N/A   Occupational History  . Not on file.   Social History Main Topics  . Smoking status: Never Smoker   . Smokeless tobacco: Never Used  . Alcohol Use: No  . Drug Use: No  . Sexual  Activity: Yes   Other Topics Concern  . Not on file   Social History Narrative    Family History  Problem Relation Age of Onset  . Anemia Mother   . Anemia Sister   . Birth defects Brother     heart disorder?  Marland Kitchen. Hypertension Maternal Grandmother   . Diabetes Maternal Grandmother   . Hypertension Maternal Grandfather   . Diabetes Maternal Grandfather   . Hypertension Paternal Grandmother   . Hypertension Paternal Grandfather      Review of Systems:  See HPI for pertinent ROS. All other ROS negative.    Physical Exam: Blood pressure 140/90, temperature 98 F (36.7 C), weight 287 lb (130.182 kg), unknown if currently  breastfeeding., Body mass index is 54.26 kg/(m^2). General: Severely Obese WF. Appears in no acute distress. Neck: Supple. No thyromegaly. No lymphadenopathy. Lungs: Clear bilaterally to auscultation without wheezes, rales, or rhonchi. Breathing is unlabored. Heart: RRR with S1 S2. No murmurs, rubs, or gallops. Musculoskeletal:  Strength and tone normal for age. Extremities/Skin: Warm and dry. Neuro: Alert and oriented X 3. Moves all extremities spontaneously. Gait is normal. CNII-XII grossly in tact. Psych:  Responds to questions appropriately with a normal affect.     ASSESSMENT AND PLAN:  25 y.o. year old female with  1. Essential hypertension She is to continue enalapril 10 mg daily. Check lab to monitor. - BASIC METABOLIC PANEL WITH GFR  2. Generalized anxiety disorder She is going to wean off of the Zoloft. I wrote down dosing plan for her. If she starts to feel worse she will call me. Otherwise she is going to schedule follow-up office visit with me 2 months. - sertraline (ZOLOFT) 50 MG tablet; Take 2 daily --take as directed to wean off  Dispense: 60 tablet; Refill: 0  3. Panic disorder She is going to wean off of the Zoloft. I wrote down dosing plan for her. If she starts to feel worse she will call me. Otherwise she is going to schedule follow-up office visit with me 2 months. She is going to use Klonopin as needed. She states that she only has panic attacks 1-3 times a week --SO-- she should only be using Klonopin 1-3 times per week. Monitor her use of this medication. - sertraline (ZOLOFT) 50 MG tablet; Take 2 daily --take as directed to wean off  Dispense: 60 tablet; Refill: 0 - clonazePAM (KLONOPIN) 0.5 MG tablet; Take 1 tablet (0.5 mg total) by mouth daily as needed for anxiety.  Dispense: 30 tablet; Refill: 0   Follow up office visit 2 weeks or sooner if needed.  Signed, [address]Mary Beth Womens BayDixon, [place], [facility] 08/03/2015 10:57 AM

## 2015-08-04 LAB — BASIC METABOLIC PANEL WITH GFR
BUN: 7 mg/dL (ref 7–25)
CALCIUM: 9 mg/dL (ref 8.6–10.2)
CO2: 23 mmol/L (ref 20–31)
CREATININE: 0.55 mg/dL (ref 0.50–1.10)
Chloride: 104 mmol/L (ref 98–110)
GFR, Est African American: >89 mL/min (ref >=60)
GLUCOSE: 134 mg/dL — AB (ref 70–99)
POTASSIUM: 4.1 mmol/L (ref 3.5–5.3)
Sodium: 138 mmol/L (ref 135–146)

## 2015-08-04 NOTE — Addendum Note (Signed)
Addended by: Luella CookMCINTYRE, Concepcion Kirkpatrick E on: 08/04/2015 09:49 AM   Modules accepted: Medications

## 2015-09-26 ENCOUNTER — Other Ambulatory Visit: Payer: Self-pay | Admitting: Physician Assistant

## 2015-09-26 DIAGNOSIS — F410 Panic disorder [episodic paroxysmal anxiety] without agoraphobia: Secondary | ICD-10-CM

## 2015-09-26 DIAGNOSIS — F411 Generalized anxiety disorder: Secondary | ICD-10-CM

## 2015-10-08 ENCOUNTER — Ambulatory Visit (INDEPENDENT_AMBULATORY_CARE_PROVIDER_SITE_OTHER): Payer: Medicaid Other | Admitting: Physician Assistant

## 2015-10-08 ENCOUNTER — Encounter: Payer: Self-pay | Admitting: Physician Assistant

## 2015-10-08 VITALS — BP 148/90 | HR 89 | Temp 98.1°F | Resp 18 | Wt 289.0 lb

## 2015-10-08 DIAGNOSIS — I10 Essential (primary) hypertension: Secondary | ICD-10-CM | POA: Diagnosis not present

## 2015-10-08 DIAGNOSIS — F329 Major depressive disorder, single episode, unspecified: Secondary | ICD-10-CM | POA: Insufficient documentation

## 2015-10-08 DIAGNOSIS — F410 Panic disorder [episodic paroxysmal anxiety] without agoraphobia: Secondary | ICD-10-CM | POA: Diagnosis not present

## 2015-10-08 DIAGNOSIS — F411 Generalized anxiety disorder: Secondary | ICD-10-CM | POA: Diagnosis not present

## 2015-10-08 DIAGNOSIS — F32A Depression, unspecified: Secondary | ICD-10-CM | POA: Insufficient documentation

## 2015-10-08 MED ORDER — SERTRALINE HCL 100 MG PO TABS: 100.0000 mg | ORAL_TABLET | Freq: Every day | ORAL | 5 refills | Status: DC

## 2015-10-08 MED ORDER — SERTRALINE HCL 50 MG PO TABS: 50.0000 mg | ORAL_TABLET | Freq: Every day | ORAL | 0 refills | Status: DC

## 2015-10-08 MED ORDER — ENALAPRIL MALEATE 10 MG PO TABS: 10.0000 mg | ORAL_TABLET | Freq: Every day | ORAL | 1 refills | Status: DC

## 2015-10-08 MED ORDER — CLONAZEPAM 0.5 MG PO TABS: 0.5000 mg | ORAL_TABLET | Freq: Every day | ORAL | 0 refills | Status: DC | PRN

## 2015-10-08 NOTE — Progress Notes (Signed)
Patient ID: NIKEYA MAXIM MRN: [id number], DOB: 1990-11-23, 25 y.o. Date of Encounter: @DATE @  Chief Complaint:  Chief Complaint  Patient presents with  . Follow-up    HPI: 25 y.o. year old white female  presents with above.   08/03/2015: Hypertension: Patient had a C-section 12/03/2014. She reports that with a prior pregnancy (prior to the 11/2014 pregnancy) she had high blood pressure during the last 4 weeks of pregnancy but never required medicine--says that they just monitored that. After delivery 2 hours later her blood pressure was normal. Says that with this most recent pregnancy--- at about 20 weeks--- her blood pressure increased. She was treated with labetalol. She says that at follow-up visit with her gynecologist after delivery of the baby says it was either the end of November or the beginning of December-- that they stopped the labetalol and started enalapril. Taking enalapril 10 mg daily since then.  Anxiety disorder/panic disorder: She states that when she became pregnant with this most recent pregnancy she had stopped medications prior to the pregnancy. Was not taking Zoloft at that time. Says that during her pregnancy her mother-in-law had cancer and she was having to care for her. Says that she died 2 months ago. Says that she was caring for her mother-in-law at about the same time as her blood pressure went up so the doctor started her on Zoloft--- feeling that the stress and anxiety caring for the mother-in-law was affecting her. Patient states that she is still taking the Zoloft. However, she says that she doesn't think that it is helping at all. Say she feels the same as she did at the first half of her pregnancy when she was off of the medication. Says that she does not feel like she has any generalized anxiety issues right now. Says that she does feel panic at times. Says this happens 1-3 times per week. Says that in the past Klonopin worked very well for  that.  AT THAT OV: --BP was at goal. Checked bmet to monitor labs and planned to continue enalapril 10 mg daily. --Planned to wean off of the Zoloft. Prescribed Klonopin No. 30 +0 for her to have available to use as needed for anxiety/panic.   10/08/2015: She states that she got confused about the blood pressure medicine. Says that when she was called and told that lab was normal she wasn't sure if she needed the blood pressure medicine anymore or not. She did not refill blood pressure medicine and is not taking that currently. At this visit today her blood pressure reading is elevated and so I told her to restart the enalapril 10 mg daily and that the lab work was stable when taking the medicine at last visit  08/03/15.  She also reports that she did not realize how much the Zoloft was working until she started to come off of it. Says that after weaning down the dose, she could notice some difference-- but since she has been entirely off of the medicine --she can really tell a difference.  Says that there were days where she did not want to get out of the bed, did not want to have to be a parent, did not want to have to be a mom, did not want have to be a wife. As well, definitely has been anxious and at times felt panic.  Says that the Zoloft definitely was working and she definitely does need to be back on it.  Says that "as much  as she does not want to have to depend on a medication and need a medication, she realizes that she has no choice." Also says that when she first was weaning down the Zoloft, she only needed the Klonopin about once a week. Once she was completely off of Zoloft, she was needing it much more frequently.  No other complaints or concerns today.  Past Medical History:  Diagnosis Date  . Anemia   . Anxiety   . Asthma    childhood  . Depression    see social work consult  . Hx of varicella   . Obese   . Pregnancy induced hypertension      Home Meds: Outpatient  Medications Prior to Visit  Medication Sig Dispense Refill  . IRON PO Take 325 mg by mouth 3 (three) times daily.     . clonazePAM (KLONOPIN) 0.5 MG tablet Take 1 tablet (0.5 mg total) by mouth daily as needed for anxiety. 30 tablet 0  . albuterol (PROVENTIL HFA;VENTOLIN HFA) 108 (90 BASE) MCG/ACT inhaler Inhale 2 puffs into the lungs every 4 (four) hours as needed for wheezing or shortness of breath. (Patient not taking: Reported on 10/08/2015) 1 Inhaler 1  . Prenatal Vit-Min-FA-Fish Oil ([facility] PRENATAL GUMMY PO) Take 2 each by mouth 2 (two) times daily. Reported on 08/03/2015    . enalapril (VASOTEC) 10 MG tablet Take 10 mg by mouth daily.    . sertraline (ZOLOFT) 50 MG tablet TAKE 2 DAILY --TAKE AS DIRECTED TO WEAN OFF (Patient not taking: Reported on 10/08/2015) 60 tablet 0   No facility-administered medications prior to visit.     Allergies: No Known Allergies  Social History   Social History  . Marital status: Married    Spouse name: N/A  . Number of children: N/A  . Years of education: N/A   Occupational History  . Not on file.   Social History Main Topics  . Smoking status: Never Smoker  . Smokeless tobacco: Never Used  . Alcohol use No  . Drug use: No  . Sexual activity: Yes   Other Topics Concern  . Not on file   Social History Narrative  . No narrative on file    Family History  Problem Relation Age of Onset  . Anemia Mother   . Anemia Sister   . Birth defects Brother     heart disorder?  Marland Kitchen. Hypertension Maternal Grandmother   . Diabetes Maternal Grandmother   . Hypertension Maternal Grandfather   . Diabetes Maternal Grandfather   . Hypertension Paternal Grandmother   . Hypertension Paternal Grandfather      Review of Systems:  See HPI for pertinent ROS. All other ROS negative.    Physical Exam: Blood pressure (!) 148/90, pulse 89, temperature 98.1 F (36.7 C), temperature source Oral, resp. rate 18, weight 289 lb (131.1 kg), last menstrual period  10/01/2015, unknown if currently breastfeeding., Body mass index is 54.61 kg/m. General: Severely Obese WF. Appears in no acute distress. Neck: Supple. No thyromegaly. No lymphadenopathy. Lungs: Clear bilaterally to auscultation without wheezes, rales, or rhonchi. Breathing is unlabored. Heart: RRR with S1 S2. No murmurs, rubs, or gallops. Musculoskeletal:  Strength and tone normal for age. Extremities/Skin: Warm and dry. Neuro: Alert and oriented X 3. Moves all extremities spontaneously. Gait is normal. CNII-XII grossly in tact. Psych:  Responds to questions appropriately with a normal affect.     ASSESSMENT AND PLAN:  25 y.o. year old female with  1. Essential hypertension  At OV 08/03/15 she was on the enalapril 10 mg daily and at that time her blood pressure was controlled/at goal and her bmet was normal. I meant for her to continue the enalapril 10 mg daily. However at follow-up visit 10/08/15 there had been a misunderstanding and she has stopped the enalapril and her blood pressure is now elevated so she is to restart this and continue taking this daily..  She voices understanding and agrees. - enalapril (VASOTEC) 10 MG tablet; Take 1 tablet (10 mg total) by mouth daily.  Dispense: 90 tablet; Refill: 1  2. Generalized anxiety disorder 3. Panic disorder 4. Depression She is to restart Zoloft with taking 50 mg daily for 1 month and then go up to the 100 mg daily. (Prior dose prior to OV 07/2015 was 100mg ) She is to use the Klonopin as needed for anxiety/panic. - sertraline (ZOLOFT) 50 MG tablet; Take 1 tablet (50 mg total) by mouth daily.  Dispense: 30 tablet; Refill: 0---This was sent electronically - sertraline (ZOLOFT) 100 MG tablet; Take 1 tablet (100 mg total) by mouth daily.  Dispense: 30 tablet; Refill: 5--This was printed--with comment--"DO NOT FILL UNTIL 11/07/2015 - clonazePAM (KLONOPIN) 0.5 MG tablet; Take 1 tablet (0.5 mg total) by mouth daily as needed for anxiety.   Dispense: 30 tablet; Refill: 0   Routine follow-up visit in 6 months if things remain stable. Follow-up sooner if needed.  Murray Hodgkins [place], [place], [facility] 10/08/2015 2:27 PM

## 2015-11-24 IMAGING — US US MFM OB COMP +14 WKS
1 series · 14 of 28 positions shown · non-contrast
Comparison: none

[Series 1: us mfm ob comp +14 wks · 60 acquisitions, 14 frames shown]
[im 3/60]
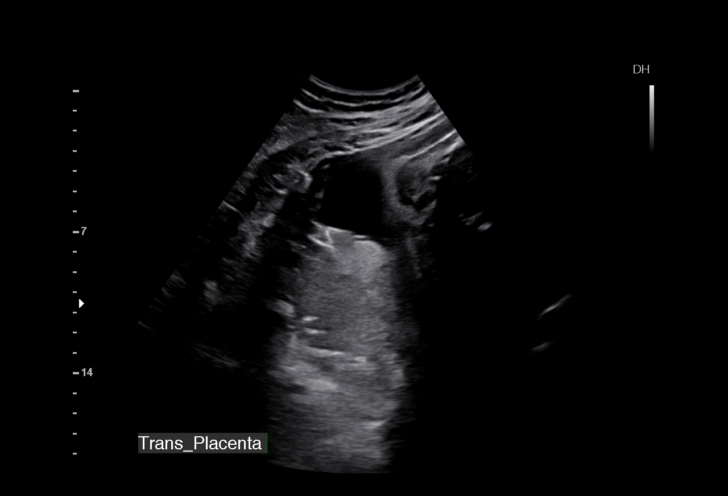
[im 7/60]
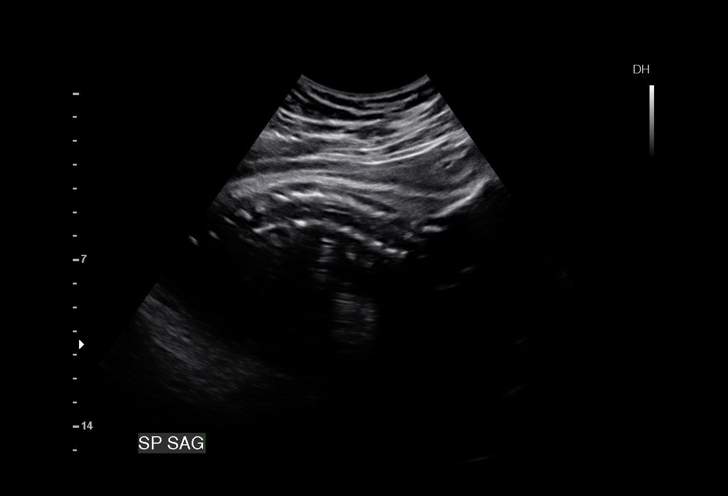
[im 11/60]
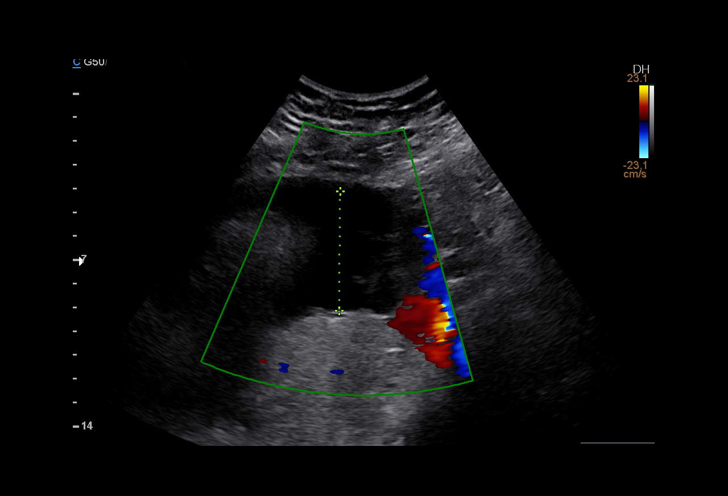
[im 16/60]
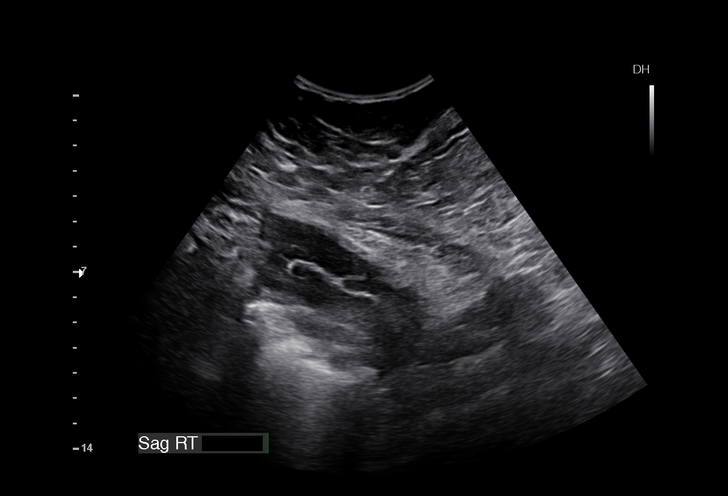
[im 20/60]
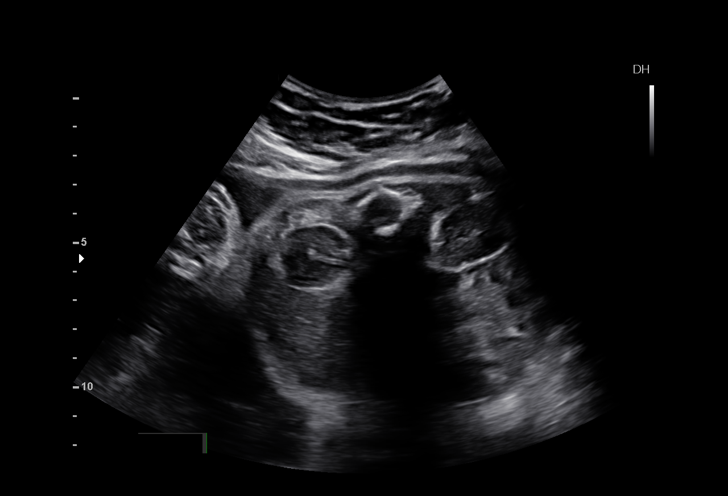
[im 25/60]
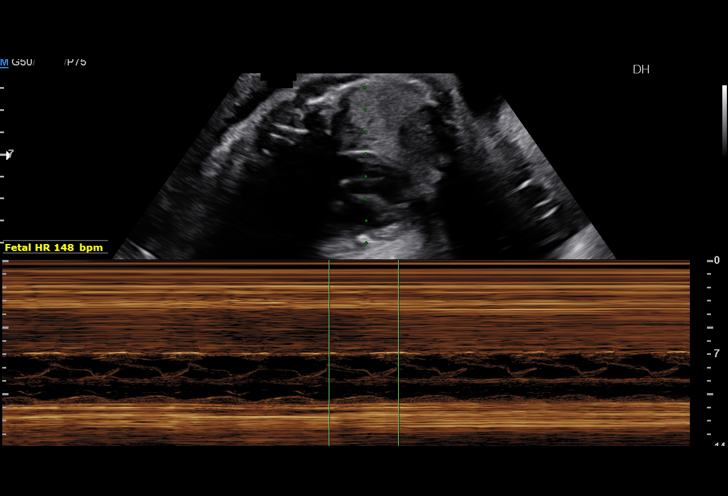
[im 29/60]
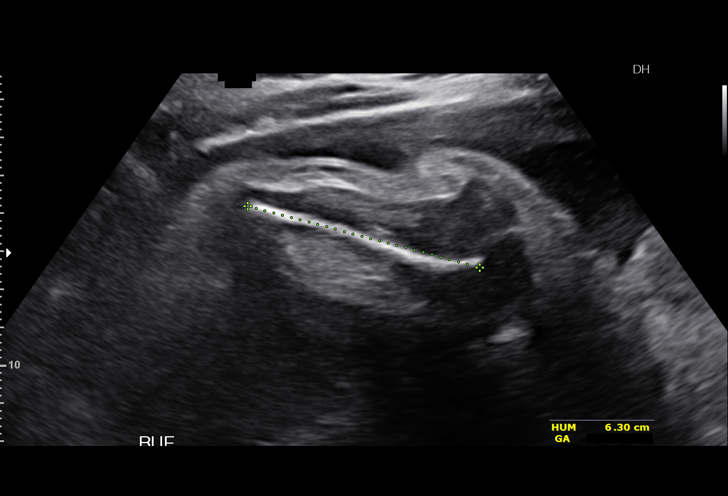
[im 33/60]
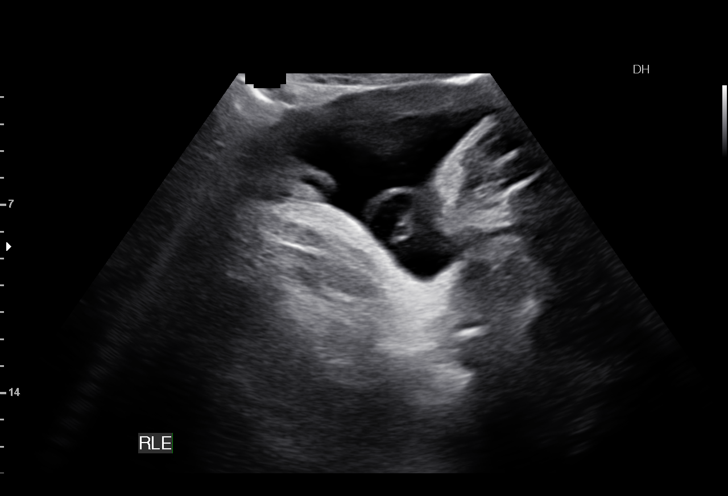
[im 38/60]
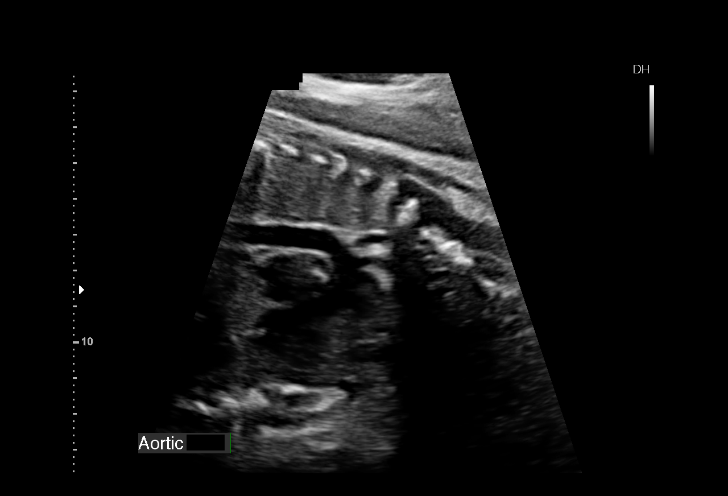
[im 42/60]
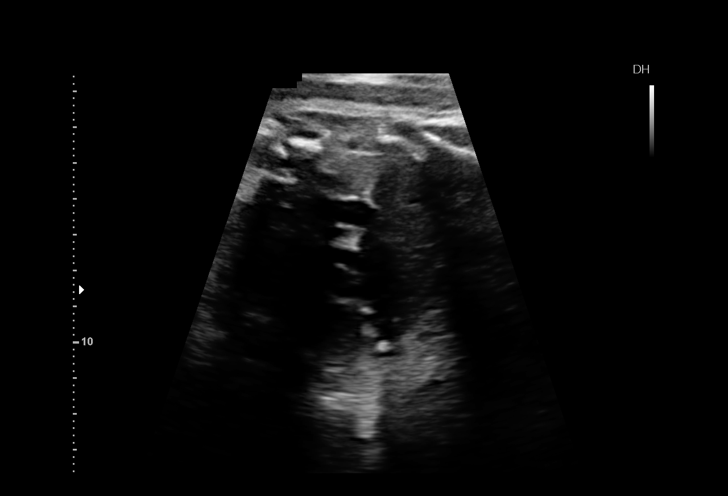
[im 46/60]
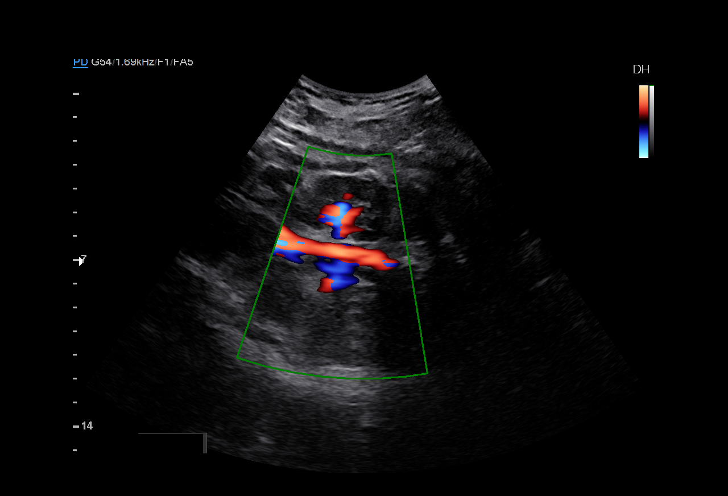
[im 51/60]
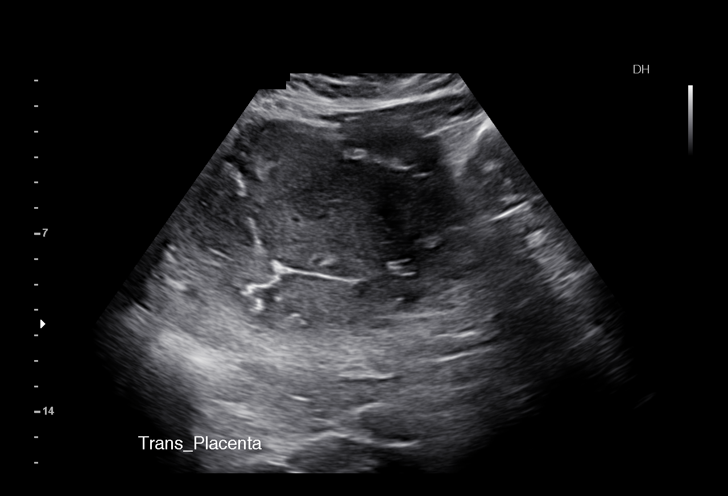
[im 55/60]
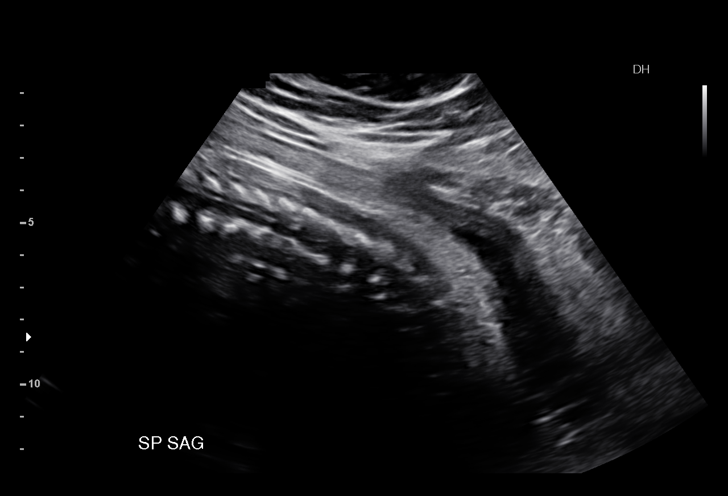
[im 60/60]
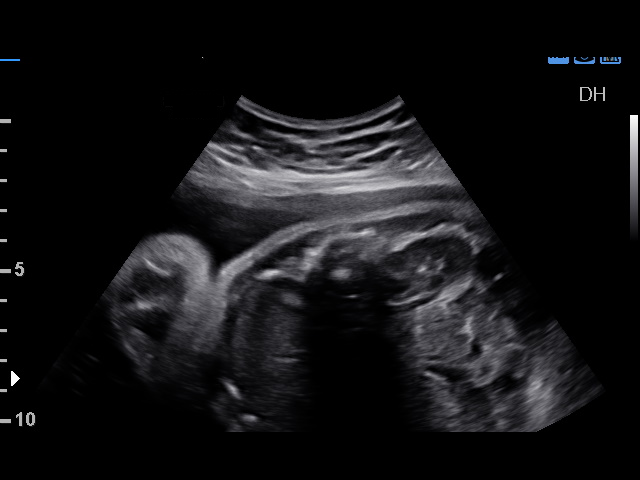

[14 of 28 positions shown; findings below may reference images not displayed]

OBSTETRICS REPORT
(Signed Final 11/26/2014 [DATE])

Name:       RUZBI NIKOLOVA                      Visit  11/25/2014 [DATE]
Date:

Service(s) Provided

Indications

35 weeks gestation of pregnancy
Hypertension - Gestational
Fetal heart rate decelerations affecting               O76
management of mother
Obesity complicating pregnancy, third trimester
Previous cesarean delivery, antepartum
Fetal Evaluation

Num Of             1
Fetuses:
Fetal Heart        148                          bpm
Rate:
Cardiac Activity:  Observed
Presentation:      Cephalic
Placenta:          Posterior
P. Cord            Not well visualized
Insertion:

Amniotic Fluid
AFI FV:      Subjectively within normal limits
AFI Sum:     11.76    cm      34  %Tile     Larg Pckt:    5.02   cm
RUQ:   5.02    cm    RLQ:   2       cm   LUQ:    1.27    cm   LLQ:    3.47   cm
Biophysical Evaluation

Amniotic F.V:   Pocket => 2 cm two          F. Tone:        Observed
planes
F. Movement:    Observed                    Score:          [DATE]
F. Breathing:   Not Observed
Biometry

BPD:     90.1   m    G. Age:   36w 4d                 CI:        75.99   70 - 86
m
FL/HC:      20.3   20.1 -
22.3
HC:     327.6   m    G. Age:   37w 2d        68  %    HC/AC:      0.94   0.93 -
m
AC:     349.1   m    G. Age:   38w 6d      > 97  %    FL/BPD      73.8   71 - 87
m                                     :
FL:      66.5   m    G. Age:   34w 2d        21  %    FL/AC:      19.0   20 - 24
m
HUM:     62.9   m    G. Age:   36w 4d        91  %
m

Est.        5991   gm          7 lb   > 90   %
FW:
Gestational Age

LMP:           39w 3d        Date:  02/22/14                  EDD:   11/29/14
Clinical EDD:  35w 1d                                         EDD:   12/29/14
U/S Today:     36w 5d                                         EDD:   12/18/14
Best:          35w 1d    Det. By:   Clinical EDD              EDD:   12/29/14
Anatomy

Cranium:          Appears normal         Aortic Arch:       Appears normal
Fetal Cavum:      Not well visualized    Ductal Arch:       Appears normal
Ventricles:       Appears normal         Diaphragm:         Appears normal
Choroid Plexus:   Not well visualized    Stomach:           Appears normal,
left sided
Cerebellum:       Not well visualized    Abdomen:           Appears normal
Posterior         Not well visualized    Abdominal          Not well visualized
Fossa:                                   Wall:
Nuchal Fold:      Not applicable (>20    Cord Vessels:      Appears normal (3
wks GA)                                   vessel cord)
Face:             Not well visualized    Kidneys:           Appear normal
Lips:             Not well visualized    Bladder:           Appears normal
Heart:            Not well visualized    Spine:             Appears normal
RVOT:             Not well visualized    Lower              Visualized
Extremities:
LVOT:             Not well visualized    Upper              Visualized
Extremities:

Other:   Technically difficult due to maternal habitus and fetal position.
Cervix Uterus Adnexa

Cervix:       Not visualized (advanced GA >55wks)

Adnexa:     No abnormality visualized.
Impression

Single IUP at 35w 1d
Limited views of the fetal anatomy obtained due to late
gestational age and fetal position
No gross anomalies noted
The estimated fetal weight is > 90th %tile; the AC
measures > 97th %tile.
Posterior placenta without previa
BPP [DATE]
Normal amniotic fluid volume
Recommendations

Follow up ultrasounds as clinically indicated

## 2016-01-08 ENCOUNTER — Other Ambulatory Visit: Payer: Self-pay

## 2016-01-08 DIAGNOSIS — F411 Generalized anxiety disorder: Secondary | ICD-10-CM

## 2016-01-08 DIAGNOSIS — F32A Depression, unspecified: Secondary | ICD-10-CM

## 2016-01-08 DIAGNOSIS — F410 Panic disorder [episodic paroxysmal anxiety] without agoraphobia: Secondary | ICD-10-CM

## 2016-01-08 DIAGNOSIS — F329 Major depressive disorder, single episode, unspecified: Secondary | ICD-10-CM

## 2016-01-08 MED ORDER — SERTRALINE HCL 100 MG PO TABS: 100.0000 mg | ORAL_TABLET | Freq: Every day | ORAL | 3 refills | Status: DC

## 2016-01-08 MED ORDER — CLONAZEPAM 0.5 MG PO TABS: 0.5000 mg | ORAL_TABLET | Freq: Every day | ORAL | 0 refills | Status: DC | PRN

## 2016-01-08 NOTE — Telephone Encounter (Signed)
Pt was seen in office 9-21 and was given her two RX's pt states she sold her car that the RX was in and is req a refill

## 2016-01-08 NOTE — Telephone Encounter (Signed)
Rx called in pt aware 

## 2016-01-08 NOTE — Telephone Encounter (Signed)
(  FYI---What ?!?!-- meds were Rxed 9/21 and she is just now going to get them filled---3 months later---?!?!) Can send Rx for Zoloft for # 30 + 3 and Klonopin # 30 + 0

## 2016-04-14 ENCOUNTER — Ambulatory Visit: Payer: Medicaid Other | Admitting: Physician Assistant

## 2016-04-18 ENCOUNTER — Encounter: Payer: Self-pay | Admitting: Physician Assistant

## 2016-04-18 ENCOUNTER — Ambulatory Visit (INDEPENDENT_AMBULATORY_CARE_PROVIDER_SITE_OTHER): Payer: 59 | Admitting: Physician Assistant

## 2016-04-18 VITALS — BP 150/102 | HR 89 | Temp 98.0°F | Resp 16 | Wt 293.0 lb

## 2016-04-18 DIAGNOSIS — F324 Major depressive disorder, single episode, in partial remission: Secondary | ICD-10-CM

## 2016-04-18 DIAGNOSIS — F411 Generalized anxiety disorder: Secondary | ICD-10-CM

## 2016-04-18 DIAGNOSIS — I10 Essential (primary) hypertension: Secondary | ICD-10-CM

## 2016-04-18 MED ORDER — ENALAPRIL MALEATE 20 MG PO TABS: 20.0000 mg | ORAL_TABLET | Freq: Every day | ORAL | 0 refills | Status: DC

## 2016-04-18 MED ORDER — SERTRALINE HCL 50 MG PO TABS: 50.0000 mg | ORAL_TABLET | Freq: Every day | ORAL | 0 refills | Status: DC

## 2016-04-18 MED ORDER — DULOXETINE HCL 60 MG PO CPEP: 60.0000 mg | ORAL_CAPSULE | Freq: Every day | ORAL | 1 refills | Status: DC

## 2016-04-18 NOTE — Progress Notes (Signed)
Patient ID: Marilyn Cooper MRN: [id number], DOB: [birth year], 26 y.o. Date of Encounter: @  Chief Complaint:  Chief Complaint  Patient presents with  . Hypertension    6mos f/u  . Anxiety    HPI: 26 y.o. year old white female  presents with above.   08/03/2015: Hypertension: Patient had a C-section 12/03/2014. She reports that with a prior pregnancy (prior to the 11/2014 pregnancy) she had high blood pressure during the last 4 weeks of pregnancy but never required medicine--says that they just monitored that. After delivery 2 hours later her blood pressure was normal. Says that with this most recent pregnancy--- at about 20 weeks--- her blood pressure increased. She was treated with labetalol. She says that at follow-up visit with her gynecologist after delivery of the baby says it was either the end of November or the beginning of December-- that they stopped the labetalol and started enalapril. Taking enalapril 10 mg daily since then.  Anxiety disorder/panic disorder: She states that when she became pregnant with this most recent pregnancy she had stopped medications prior to the pregnancy. Was not taking Zoloft at that time. Says that during her pregnancy her mother-in-law had cancer and she was having to care for her. Says that she died 2 months ago. Says that she was caring for her mother-in-law at about the same time as her blood pressure went up so the doctor started her on Zoloft--- feeling that the stress and anxiety caring for the mother-in-law was affecting her. Patient states that she is still taking the Zoloft. However, she says that she doesn't think that it is helping at all. Say she feels the same as she did at the first half of her pregnancy when she was off of the medication. Says that she does not feel like she has any generalized anxiety issues right now. Says that she does feel panic at times. Says this happens 1-3 times per week. Says that in the past Klonopin  worked very well for that.  AT THAT OV: --BP was at goal. Checked bmet to monitor labs and planned to continue enalapril 10 mg daily. --Planned to wean off of the Zoloft. Prescribed Klonopin No. 30 +0 for her to have available to use as needed for anxiety/panic.   10/08/2015: She states that she got confused about the blood pressure medicine. Says that when she was called and told that lab was normal she wasn't sure if she needed the blood pressure medicine anymore or not. She did not refill blood pressure medicine and is not taking that currently. At this visit today her blood pressure reading is elevated and so I told her to restart the enalapril 10 mg daily and that the lab work was stable when taking the medicine at last visit  08/03/15.  She also reports that she did not realize how much the Zoloft was working until she started to come off of it. Says that after weaning down the dose, she could notice some difference-- but since she has been entirely off of the medicine --she can really tell a difference.  Says that there were days where she did not want to get out of the bed, did not want to have to be a parent, did not want to have to be a mom, did not want have to be a wife. As well, definitely has been anxious and at times felt panic.  Says that the Zoloft definitely was working and she definitely does need to be  back on it.  Says that "as much as she does not want to have to depend on a medication and need a medication, she realizes that she has no choice." Also says that when she first was weaning down the Zoloft, she only needed the Klonopin about once a week. Once she was completely off of Zoloft, she was needing it much more frequently.  AT THAT OV: She was to continue the enalapril 10 mg daily. Was to restart Zoloft 50 mg daily for one month then increase to 100 mg daily.  04/18/2016: Today she reports that she is taking the enalapril 10 mg daily. Reports that she has not had her  blood pressure checked anywhere else to know how it has been running. She reports that she has been taking the Zoloft 100 mg. However, says that she does not feel that this is controlling her depression. Initially, she says that she just feels like she doesn't have much motivation or drive to do things. She just sits on the couch instead of taking care of cleaning the dishes or vacuuming the house. Sleeps good and sleeps 8-10 hours but then feels tired.  At a later time in the visit she admits that she has actually even had some suicidal ideation at times. Says that they have done things like painting rooms in the house, she has gotten a job thinking that that would make her feel more needed and useful. However says that none of these things are making her feel better. Has no active suicidal ideations or suicidal plans at this time. She brought her husband with her to today's visit because she said she wanted to make sure that she was completely honest with me. I asked both of them if they feel that in the past she has had any symptoms consistent with mania and both of them say no. State that she has had periods of being normal mood and then periods of being low but never overly high/manic mood/affect.  Zoloft is the only medication she has been on. Has been on no other SSRI or SSNRI.  Past Medical History:  Diagnosis Date  . Anemia   . Anxiety   . Asthma    childhood  . Depression    see social work consult  . Hx of varicella   . Obese   . Pregnancy induced hypertension      Home Meds: Outpatient Medications Prior to Visit  Medication Sig Dispense Refill  . clonazePAM (KLONOPIN) 0.5 MG tablet Take 1 tablet (0.5 mg total) by mouth daily as needed for anxiety. 30 tablet 0  . IRON PO Take 325 mg by mouth 3 (three) times daily.     . enalapril (VASOTEC) 10 MG tablet Take 1 tablet (10 mg total) by mouth daily. 90 tablet 1  . sertraline (ZOLOFT) 100 MG tablet Take 1 tablet (100 mg total) by mouth  daily. 30 tablet 3  . albuterol (PROVENTIL HFA;VENTOLIN HFA) 108 (90 BASE) MCG/ACT inhaler Inhale 2 puffs into the lungs every 4 (four) hours as needed for wheezing or shortness of breath. (Patient not taking: Reported on 10/08/2015) 1 Inhaler 1  . Prenatal Vit-Min-FA-Fish Oil ([facility] PRENATAL GUMMY PO) Take 2 each by mouth 2 (two) times daily. Reported on 08/03/2015    . sertraline (ZOLOFT) 50 MG tablet Take 1 tablet (50 mg total) by mouth daily. 30 tablet 0   No facility-administered medications prior to visit.     Allergies: No Known Allergies  Social History  Social History  . Marital status: Married    Spouse name: N/A  . Number of children: N/A  . Years of education: N/A   Occupational History  . Not on file.   Social History Main Topics  . Smoking status: Never Smoker  . Smokeless tobacco: Never Used  . Alcohol use No  . Drug use: No  . Sexual activity: Yes   Other Topics Concern  . Not on file   Social History Narrative  . No narrative on file    Family History  Problem Relation Age of Onset  . Anemia Mother   . Anemia Sister   . Birth defects Brother     heart disorder?  Marland Kitchen Hypertension Maternal Grandmother   . Diabetes Maternal Grandmother   . Hypertension Maternal Grandfather   . Diabetes Maternal Grandfather   . Hypertension Paternal Grandmother   . Hypertension Paternal Grandfather      Review of Systems:  See HPI for pertinent ROS. All other ROS negative.    Physical Exam: Blood pressure (!) 150/102, pulse 89, temperature 98 F (36.7 C), temperature source Oral, resp. rate 16, weight 293 lb (132.9 kg), SpO2 99 %, not currently breastfeeding., Body mass index is 55.36 kg/m. General: Severely Obese WF. Appears in no acute distress. Neck: Supple. No thyromegaly. No lymphadenopathy. Lungs: Clear bilaterally to auscultation without wheezes, rales, or rhonchi. Breathing is unlabored. Heart: RRR with S1 S2. No murmurs, rubs, or gallops. Musculoskeletal:   Strength and tone normal for age. Extremities/Skin: Warm and dry. Neuro: Alert and oriented X 3. Moves all extremities spontaneously. Gait is normal. CNII-XII grossly in tact. Psych:  Responds to questions appropriately with a normal affect.     ASSESSMENT AND PLAN:  26 y.o. year old female with  1. Essential hypertension Blood Pressure is elevated. Reviewed that at her last visit 09/2015 blood pressure was 148/90. Increase enalapril to 20 mg daily RECHECK BP AND BMET NEXT OV  2. Generalized anxiety disorder 3. Panic disorder 4. Depression Wean off of Zoloft and start Cymbalta 60 mg daily. Discussed with her that it can be frustrating but we will find the right medicine for her.Discussed that we can get her to feeling normal again. Gave her the number to call [facility] to schedule appointment with therapist and if she wants to see a psychiatrist,  she can schedule that through this number as well. She is going to decrease Zoloft to 50 mg and then start Cymbalta 60 mg. Will schedule follow-up visit here in 8 weeks. If has suicidal ideation follow-up sooner and also discussed indications to go to the ER.   [address] [place], [place], [facility] 04/18/2016 3:41 PM

## 2016-05-04 ENCOUNTER — Other Ambulatory Visit: Payer: Self-pay | Admitting: Physician Assistant

## 2016-05-04 DIAGNOSIS — I10 Essential (primary) hypertension: Secondary | ICD-10-CM

## 2016-05-04 NOTE — Telephone Encounter (Signed)
Refill appropriate 

## 2016-05-21 ENCOUNTER — Other Ambulatory Visit: Payer: Self-pay | Admitting: Physician Assistant

## 2016-05-21 DIAGNOSIS — I10 Essential (primary) hypertension: Secondary | ICD-10-CM

## 2016-06-15 ENCOUNTER — Encounter: Payer: Self-pay | Admitting: Physician Assistant

## 2016-06-15 ENCOUNTER — Ambulatory Visit (INDEPENDENT_AMBULATORY_CARE_PROVIDER_SITE_OTHER): Payer: 59 | Admitting: Physician Assistant

## 2016-06-15 VITALS — BP 160/110 | HR 101 | Temp 98.3°F | Resp 16 | Wt 291.8 lb

## 2016-06-15 DIAGNOSIS — F329 Major depressive disorder, single episode, unspecified: Secondary | ICD-10-CM | POA: Diagnosis not present

## 2016-06-15 DIAGNOSIS — F410 Panic disorder [episodic paroxysmal anxiety] without agoraphobia: Secondary | ICD-10-CM

## 2016-06-15 DIAGNOSIS — I10 Essential (primary) hypertension: Secondary | ICD-10-CM

## 2016-06-15 DIAGNOSIS — F411 Generalized anxiety disorder: Secondary | ICD-10-CM | POA: Diagnosis not present

## 2016-06-15 MED ORDER — ESCITALOPRAM OXALATE 10 MG PO TABS: 10.0000 mg | ORAL_TABLET | Freq: Every day | ORAL | 1 refills | Status: DC

## 2016-06-15 MED ORDER — AMLODIPINE BESYLATE 5 MG PO TABS: 5.0000 mg | ORAL_TABLET | Freq: Every day | ORAL | 3 refills | Status: DC

## 2016-06-15 MED ORDER — CLONAZEPAM 0.5 MG PO TABS: 0.5000 mg | ORAL_TABLET | Freq: Every day | ORAL | 0 refills | Status: DC | PRN

## 2016-06-15 NOTE — Progress Notes (Signed)
Patient ID: RIAN KOON MRN: [id number], DOB: May 27, 1990, 26 y.o. Date of Encounter: @DATE @  Chief Complaint:  Chief Complaint  Patient presents with  . Hypertension    25 week f/u    HPI: 26 y.o. year old white female  presents with above.   08/03/2015: Hypertension: Patient had a C-section 12/03/2014. She reports that with a prior pregnancy (prior to the 11/2014 pregnancy) she had high blood pressure during the last 4 weeks of pregnancy but never required medicine--says that they just monitored that. After delivery 2 hours later her blood pressure was normal. Says that with this most recent pregnancy--- at about 20 weeks--- her blood pressure increased. She was treated with labetalol. She says that at follow-up visit with her gynecologist after delivery of the baby says it was either the end of November or the beginning of December-- that they stopped the labetalol and started enalapril. Taking enalapril 10 mg daily since then.  Anxiety disorder/panic disorder: She states that when she became pregnant with this most recent pregnancy she had stopped medications prior to the pregnancy. Was not taking Zoloft at that time. Says that during her pregnancy her mother-in-law had cancer and she was having to care for her. Says that she died 2 months ago. Says that she was caring for her mother-in-law at about the same time as her blood pressure went up so the doctor started her on Zoloft--- feeling that the stress and anxiety caring for the mother-in-law was affecting her. Patient states that she is still taking the Zoloft. However, she says that she doesn't think that it is helping at all. Say she feels the same as she did at the first half of her pregnancy when she was off of the medication. Says that she does not feel like she has any generalized anxiety issues right now. Says that she does feel panic at times. Says this happens 1-3 times per week. Says that in the past Klonopin worked very  well for that.  AT THAT OV: --BP was at goal. Checked bmet to monitor labs and planned to continue enalapril 10 mg daily. --Planned to wean off of the Zoloft. Prescribed Klonopin No. 30 +0 for her to have available to use as needed for anxiety/panic.   10/08/2015: She states that she got confused about the blood pressure medicine. Says that when she was called and told that lab was normal she wasn't sure if she needed the blood pressure medicine anymore or not. She did not refill blood pressure medicine and is not taking that currently. At this visit today her blood pressure reading is elevated and so I told her to restart the enalapril 10 mg daily and that the lab work was stable when taking the medicine at last visit  08/03/15.  She also reports that she did not realize how much the Zoloft was working until she started to come off of it. Says that after weaning down the dose, she could notice some difference-- but since she has been entirely off of the medicine --she can really tell a difference.  Says that there were days where she did not want to get out of the bed, did not want to have to be a parent, did not want to have to be a mom, did not want have to be a wife. As well, definitely has been anxious and at times felt panic.  Says that the Zoloft definitely was working and she definitely does need to be back on  it.  Says that "as much as she does not want to have to depend on a medication and need a medication, she realizes that she has no choice." Also says that when she first was weaning down the Zoloft, she only needed the Klonopin about once a week. Once she was completely off of Zoloft, she was needing it much more frequently.  AT THAT OV: She was to continue the enalapril 10 mg daily. Was to restart Zoloft 50 mg daily for one month then increase to 100 mg daily.  04/18/2016: Today she reports that she is taking the enalapril 10 mg daily. Reports that she has not had her blood  pressure checked anywhere else to know how it has been running. She reports that she has been taking the Zoloft 100 mg. However, says that she does not feel that this is controlling her depression. Initially, she says that she just feels like she doesn't have much motivation or drive to do things. She just sits on the couch instead of taking care of cleaning the dishes or vacuuming the house. Sleeps good and sleeps 8-10 hours but then feels tired.  At a later time in the visit she admits that she has actually even had some suicidal ideation at times. Says that they have done things like painting rooms in the house, she has gotten a job thinking that that would make her feel more needed and useful. However says that none of these things are making her feel better. Has no active suicidal ideations or suicidal plans at this time. She brought her husband with her to today's visit because she said she wanted to make sure that she was completely honest with me. I asked both of them if they feel that in the past she has had any symptoms consistent with mania and both of them say no. State that she has had periods of being normal mood and then periods of being low but never overly high/manic mood/affect.  Zoloft is the only medication she has been on. Has been on no other SSRI or SSNRI.  AT THAT OV: Weaned off Zoloft and started Cymbalta 60 mg daily. Increased enalapril from 10 mg to 20 mg daily.   06/15/2016: Today she states that she did increase enalapril from 10mg  to 20 mg daily. Today she states that she did wean off of the Zoloft and has been taking the Cymbalta daily. However she states that she is not feeling any better at all. Still feels sleepy and has to take a nap daily. Feels depressed and feels decreased interest and motivation to do things. Having no suicidal ideation. Is requesting refill on her clonazepam. I reviewed her chart and last prescription was 01/08/16 for #30+0.     Past Medical  History:  Diagnosis Date  . Anemia   . Anxiety   . Asthma    childhood  . Depression    see social work consult  . Hx of varicella   . Obese   . Pregnancy induced hypertension      Home Meds: Outpatient Medications Prior to Visit  Medication Sig Dispense Refill  . clonazePAM (KLONOPIN) 0.5 MG tablet Take 1 tablet (0.5 mg total) by mouth daily as needed for anxiety. 30 tablet 0  . DULoxetine (CYMBALTA) 60 MG capsule Take 1 capsule (60 mg total) by mouth daily. 30 capsule 1  . enalapril (VASOTEC) 10 MG tablet TAKE 1 TABLET BY MOUTH EVERY DAY 90 tablet 0  . enalapril (VASOTEC) 20  MG tablet TAKE 1 TABLET BY MOUTH EVERY DAY 30 tablet 0  . IRON PO Take 325 mg by mouth 3 (three) times daily.     . sertraline (ZOLOFT) 50 MG tablet Take 1 tablet (50 mg total) by mouth daily. 30 tablet 0   No facility-administered medications prior to visit.     Allergies: No Known Allergies  Social History   Social History  . Marital status: Married    Spouse name: N/A  . Number of children: N/A  . Years of education: N/A   Occupational History  . Not on file.   Social History Main Topics  . Smoking status: Never Smoker  . Smokeless tobacco: Never Used  . Alcohol use No  . Drug use: No  . Sexual activity: Yes   Other Topics Concern  . Not on file   Social History Narrative  . No narrative on file    Family History  Problem Relation Age of Onset  . Anemia Mother   . Anemia Sister   . Birth defects Brother        heart disorder?  Marland Kitchen. Hypertension Maternal Grandmother   . Diabetes Maternal Grandmother   . Hypertension Maternal Grandfather   . Diabetes Maternal Grandfather   . Hypertension Paternal Grandmother   . Hypertension Paternal Grandfather      Review of Systems:  See HPI for pertinent ROS. All other ROS negative.    Physical Exam: Blood pressure (!) 160/110, pulse (!) 101, temperature 98.3 F (36.8 C), temperature source Oral, resp. rate 16, weight 291 lb 12.8 oz  (132.4 kg), SpO2 99 %, not currently breastfeeding., Body mass index is 55.14 kg/m. General: Severely Obese WF. Appears in no acute distress. Neck: Supple. No thyromegaly. No lymphadenopathy. Lungs: Clear bilaterally to auscultation without wheezes, rales, or rhonchi. Breathing is unlabored. Heart: RRR with S1 S2. No murmurs, rubs, or gallops. Musculoskeletal:  Strength and tone normal for age. Extremities/Skin: Warm and dry. Neuro: Alert and oriented X 3. Moves all extremities spontaneously. Gait is normal. CNII-XII grossly in tact. Psych:  Responds to questions appropriately with a normal affect.     ASSESSMENT AND PLAN:  26 y.o. year old female with  1. Essential hypertension Blood Pressure is elevated.  At LOV 04/18/2016-- Increased enalapril from 10mg  to 20 mg daily--Recheck BMET today (06/15/2016) to f/u dose increase.   06/15/2016--Cont Enalapril at 20mg  QD.  06/15/2016--Add Norvasc 5mg  QD  06/15/2016--RECHECK BP AT NEXT OV  2. Generalized anxiety disorder 3. Panic disorder 4. Depression 06/15/2016--Wean off of Cymbalta.  06/15/2016--Start Lexapro 10mg  QD. The only other med she has been on for this in past--Zoloft At OV 06/15/16 also printed prescription for clonazepam No. 30 +0. Last Rx was 01/08/16 for #30+0.  Discussed with her that it can be frustrating but we will find the right medicine for her.Discussed that we can get her to feeling normal again. Gave her the number to call [facility] to schedule appointment with therapist and if she wants to see a psychiatrist,  she can schedule that through this number as well.  Will schedule follow-up visit here in 6 weeks. If has suicidal ideation follow-up sooner and also discussed indications to go to the ER.  06/15/2016-- F/U office visit 6 weeks or sooner if needed. F/U sooner if feeling worse in interim.  [address], Mary Beth Tunnel CityDixon, [place], [facility] 06/15/2016 2:56 PM

## 2016-06-16 LAB — BASIC METABOLIC PANEL WITH GFR
BUN: 7 mg/dL (ref 7–25)
CHLORIDE: 105 mmol/L (ref 98–110)
CO2: 22 mmol/L (ref 20–31)
Calcium: 9.1 mg/dL (ref 8.6–10.2)
Creat: 0.63 mg/dL (ref 0.50–1.10)
GFR, Est African American: >89 mL/min (ref >=60)
GFR, Est Non African American: >89 mL/min (ref >=60)
Glucose, Bld: 90 mg/dL (ref 70–99)
POTASSIUM: 4.5 mmol/L (ref 3.5–5.3)
Sodium: 136 mmol/L (ref 135–146)

## 2016-06-25 ENCOUNTER — Other Ambulatory Visit: Payer: Self-pay | Admitting: Physician Assistant

## 2016-06-25 DIAGNOSIS — F324 Major depressive disorder, single episode, in partial remission: Secondary | ICD-10-CM

## 2016-07-05 ENCOUNTER — Other Ambulatory Visit: Payer: Self-pay

## 2016-07-05 NOTE — Telephone Encounter (Signed)
Fax request came through for Duloxetine. Pt is no longer on duloxetine. Will fax request back and lt them know the req has been denied

## 2016-07-27 ENCOUNTER — Encounter: Payer: Self-pay | Admitting: Physician Assistant

## 2016-07-27 ENCOUNTER — Ambulatory Visit (INDEPENDENT_AMBULATORY_CARE_PROVIDER_SITE_OTHER): Payer: 59 | Admitting: Physician Assistant

## 2016-07-27 VITALS — BP 150/100 | HR 116 | Temp 98.2°F | Resp 18 | Ht 61.0 in | Wt 292.2 lb

## 2016-07-27 DIAGNOSIS — F410 Panic disorder [episodic paroxysmal anxiety] without agoraphobia: Secondary | ICD-10-CM

## 2016-07-27 DIAGNOSIS — F329 Major depressive disorder, single episode, unspecified: Secondary | ICD-10-CM | POA: Diagnosis not present

## 2016-07-27 DIAGNOSIS — I10 Essential (primary) hypertension: Secondary | ICD-10-CM | POA: Diagnosis not present

## 2016-07-27 DIAGNOSIS — F411 Generalized anxiety disorder: Secondary | ICD-10-CM | POA: Diagnosis not present

## 2016-07-27 DIAGNOSIS — F32A Depression, unspecified: Secondary | ICD-10-CM

## 2016-07-27 MED ORDER — AMLODIPINE BESYLATE 10 MG PO TABS: 10.0000 mg | ORAL_TABLET | Freq: Every day | ORAL | 3 refills | Status: DC

## 2016-07-27 NOTE — Progress Notes (Signed)
Patient ID: Marilyn Cooper MRN: [id number], DOB: 1990-05-26, 26 y.o. Date of Encounter: @DATE @  Chief Complaint:  Chief Complaint  Patient presents with  . routine office visit and discuss anxiety medication    HPI: 26 y.o. year old white female  presents with above.   08/03/2015: Hypertension: Patient had a C-section 12/03/2014. She reports that with a prior pregnancy (prior to the 11/2014 pregnancy) she had high blood pressure during the last 4 weeks of pregnancy but never required medicine--says that they just monitored that. After delivery 2 hours later her blood pressure was normal. Says that with this most recent pregnancy--- at about 20 weeks--- her blood pressure increased. She was treated with labetalol. She says that at follow-up visit with her gynecologist after delivery of the baby says it was either the end of November or the beginning of December-- that they stopped the labetalol and started enalapril. Taking enalapril 10 mg daily since then.  Anxiety disorder/panic disorder: She states that when she became pregnant with this most recent pregnancy she had stopped medications prior to the pregnancy. Was not taking Zoloft at that time. Says that during her pregnancy her mother-in-law had cancer and she was having to care for her. Says that she died 2 months ago. Says that she was caring for her mother-in-law at about the same time as her blood pressure went up so the doctor started her on Zoloft--- feeling that the stress and anxiety caring for the mother-in-law was affecting her. Patient states that she is still taking the Zoloft. However, she says that she doesn't think that it is helping at all. Say she feels the same as she did at the first half of her pregnancy when she was off of the medication. Says that she does not feel like she has any generalized anxiety issues right now. Says that she does feel panic at times. Says this happens 1-3 times per week. Says that in the  past Klonopin worked very well for that.  AT THAT OV: --BP was at goal. Checked bmet to monitor labs and planned to continue enalapril 10 mg daily. --Planned to wean off of the Zoloft. Prescribed Klonopin No. 30 +0 for her to have available to use as needed for anxiety/panic.   10/08/2015: She states that she got confused about the blood pressure medicine. Says that when she was called and told that lab was normal she wasn't sure if she needed the blood pressure medicine anymore or not. She did not refill blood pressure medicine and is not taking that currently. At this visit today her blood pressure reading is elevated and so I told her to restart the enalapril 10 mg daily and that the lab work was stable when taking the medicine at last visit  08/03/15.  She also reports that she did not realize how much the Zoloft was working until she started to come off of it. Says that after weaning down the dose, she could notice some difference-- but since she has been entirely off of the medicine --she can really tell a difference.  Says that there were days where she did not want to get out of the bed, did not want to have to be a parent, did not want to have to be a mom, did not want have to be a wife. As well, definitely has been anxious and at times felt panic.  Says that the Zoloft definitely was working and she definitely does need to be back on  it.  Says that "as much as she does not want to have to depend on a medication and need a medication, she realizes that she has no choice." Also says that when she first was weaning down the Zoloft, she only needed the Klonopin about once a week. Once she was completely off of Zoloft, she was needing it much more frequently.  AT THAT OV: She was to continue the enalapril 10 mg daily. Was to restart Zoloft 50 mg daily for one month then increase to 100 mg daily.  04/18/2016: Today she reports that she is taking the enalapril 10 mg daily. Reports that she has  not had her blood pressure checked anywhere else to know how it has been running. She reports that she has been taking the Zoloft 100 mg. However, says that she does not feel that this is controlling her depression. Initially, she says that she just feels like she doesn't have much motivation or drive to do things. She just sits on the couch instead of taking care of cleaning the dishes or vacuuming the house. Sleeps good and sleeps 8-10 hours but then feels tired.  At a later time in the visit she admits that she has actually even had some suicidal ideation at times. Says that they have done things like painting rooms in the house, she has gotten a job thinking that that would make her feel more needed and useful. However says that none of these things are making her feel better. Has no active suicidal ideations or suicidal plans at this time. She brought her husband with her to today's visit because she said she wanted to make sure that she was completely honest with me. I asked both of them if they feel that in the past she has had any symptoms consistent with mania and both of them say no. State that she has had periods of being normal mood and then periods of being low but never overly high/manic mood/affect.  Zoloft is the only medication she has been on. Has been on no other SSRI or SSNRI.  AT THAT OV: Weaned off Zoloft and started Cymbalta 60 mg daily. Increased enalapril from 10 mg to 20 mg daily.   06/15/2016: Today she states that she did increase enalapril from 10mg  to 20 mg daily. Today she states that she did wean off of the Zoloft and has been taking the Cymbalta daily. However she states that she is not feeling any better at all. Still feels sleepy and has to take a nap daily. Feels depressed and feels decreased interest and motivation to do things. Having no suicidal ideation. Is requesting refill on her clonazepam. I reviewed her chart and last prescription was 01/08/16 for  #30+0.    07/27/2016: AT LOV BP was elevated so added NOrvasc 5mg . Today she states she has been taking this with no adv effects.  She says she feels MUCH better since starting the LExapro 10mg . Says that after the fact--parents and husband told her they noticed difference after she was on med for 2-3 weeks. Pt says she could feel difference after 3-4 weeks. She is smiling, laughing throughout visit today!!!  She is using the clonazepam ~ 2 times per week---if she feels overwhelmed, anxious about things she needs to get done.     Past Medical History:  Diagnosis Date  . Anemia   . Anxiety   . Asthma    childhood  . Depression    see social work consult  .  Hx of varicella   . Obese   . Pregnancy induced hypertension      Home Meds: Outpatient Medications Prior to Visit  Medication Sig Dispense Refill  . amLODipine (NORVASC) 5 MG tablet Take 1 tablet (5 mg total) by mouth daily. 90 tablet 3  . clonazePAM (KLONOPIN) 0.5 MG tablet Take 1 tablet (0.5 mg total) by mouth daily as needed for anxiety. 30 tablet 0  . enalapril (VASOTEC) 20 MG tablet TAKE 1 TABLET BY MOUTH EVERY DAY 30 tablet 0  . escitalopram (LEXAPRO) 10 MG tablet Take 1 tablet (10 mg total) by mouth daily. 30 tablet 1  . IRON PO Take 325 mg by mouth 3 (three) times daily.      No facility-administered medications prior to visit.     Allergies: No Known Allergies  Social History   Social History  . Marital status: Married    Spouse name: N/A  . Number of children: N/A  . Years of education: N/A   Occupational History  . Not on file.   Social History Main Topics  . Smoking status: Never Smoker  . Smokeless tobacco: Never Used  . Alcohol use No  . Drug use: No  . Sexual activity: Yes   Other Topics Concern  . Not on file   Social History Narrative  . No narrative on file    Family History  Problem Relation Age of Onset  . Anemia Mother   . Anemia Sister   . Birth defects Brother        heart  disorder?  Marland Kitchen. Hypertension Maternal Grandmother   . Diabetes Maternal Grandmother   . Hypertension Maternal Grandfather   . Diabetes Maternal Grandfather   . Hypertension Paternal Grandmother   . Hypertension Paternal Grandfather      Review of Systems:  See HPI for pertinent ROS. All other ROS negative.    Physical Exam: Blood pressure (!) 150/100, pulse (!) 116, temperature 98.2 F (36.8 C), temperature source Oral, resp. rate 18, height 5\' 1"  (1.549 m), weight 292 lb 3.2 oz (132.5 kg), SpO2 98 %, not currently breastfeeding., Body mass index is 55.21 kg/m. General: Severely Obese WF. Appears in no acute distress. Neck: Supple. No thyromegaly. No lymphadenopathy. Lungs: Clear bilaterally to auscultation without wheezes, rales, or rhonchi. Breathing is unlabored. Heart: RRR with S1 S2. No murmurs, rubs, or gallops. Musculoskeletal:  Strength and tone normal for age. Extremities/Skin: Warm and dry. Neuro: Alert and oriented X 3. Moves all extremities spontaneously. Gait is normal. CNII-XII grossly in tact. Psych:  Responds to questions appropriately with a normal affect.  Smiling, happy today!!     ASSESSMENT AND PLAN:  26 y.o. year old female with  1. Essential hypertension Blood Pressure is elevated.  At OV 04/18/2016-- Increased enalapril from 10mg  to 20 mg daily--Recheck BMET today (06/15/2016) to f/u dose increase.   06/15/2016--Cont Enalapril at 20mg  QD.  06/15/2016--Add Norvasc 5mg  QD  07/27/2016----Increase Norvasc to 10mg  QD   2. Generalized anxiety disorder 3. Panic disorder 4. Depression 06/15/2016--Wean off of Cymbalta.  06/15/2016--Start Lexapro 10mg  QD. 07/27/2016--Stable/Controlled. Cont Lexapro 10mg  QD and Clonazepam prn---no Rx printed at OV 07/27/2016  The only other med she has been on for this in past--Zoloft At OV 06/15/16 also printed prescription for clonazepam No. 30 +0. Last Rx was 01/08/16 for #30+0.    F/U office visit 6 months or sooner if needed.  F/U sooner if feeling worse in interim.  [id number]igned, Mary Beth SawyerDixon, [place], [facility] 07/27/2016  3:04 PM

## 2016-08-20 ENCOUNTER — Other Ambulatory Visit: Payer: Self-pay | Admitting: Physician Assistant

## 2016-08-20 DIAGNOSIS — F411 Generalized anxiety disorder: Secondary | ICD-10-CM

## 2016-08-20 DIAGNOSIS — F329 Major depressive disorder, single episode, unspecified: Secondary | ICD-10-CM

## 2016-08-21 ENCOUNTER — Other Ambulatory Visit: Payer: Self-pay | Admitting: Physician Assistant

## 2016-08-21 DIAGNOSIS — I10 Essential (primary) hypertension: Secondary | ICD-10-CM

## 2016-08-22 NOTE — Telephone Encounter (Signed)
Refill appropriate 

## 2016-09-07 ENCOUNTER — Encounter: Payer: Self-pay | Admitting: Physician Assistant

## 2016-09-18 ENCOUNTER — Other Ambulatory Visit: Payer: Self-pay | Admitting: Physician Assistant

## 2016-09-18 DIAGNOSIS — I10 Essential (primary) hypertension: Secondary | ICD-10-CM

## 2016-10-06 ENCOUNTER — Encounter: Payer: Self-pay | Admitting: Physician Assistant

## 2016-10-18 ENCOUNTER — Other Ambulatory Visit: Payer: Self-pay | Admitting: Physician Assistant

## 2016-10-18 DIAGNOSIS — F411 Generalized anxiety disorder: Secondary | ICD-10-CM

## 2016-10-18 DIAGNOSIS — F329 Major depressive disorder, single episode, unspecified: Secondary | ICD-10-CM

## 2016-11-14 ENCOUNTER — Other Ambulatory Visit: Payer: Self-pay | Admitting: Physician Assistant

## 2016-11-14 DIAGNOSIS — F329 Major depressive disorder, single episode, unspecified: Secondary | ICD-10-CM

## 2016-11-14 DIAGNOSIS — F411 Generalized anxiety disorder: Secondary | ICD-10-CM

## 2016-11-24 ENCOUNTER — Encounter: Payer: Self-pay | Admitting: Physician Assistant

## 2017-01-26 ENCOUNTER — Ambulatory Visit: Payer: 59 | Admitting: Physician Assistant

## 2019-03-20 ENCOUNTER — Encounter: Payer: Medicaid Other | Attending: Obstetrics | Admitting: Registered"

## 2019-03-20 ENCOUNTER — Other Ambulatory Visit: Payer: Self-pay

## 2019-03-20 ENCOUNTER — Encounter: Payer: Self-pay | Admitting: Registered"

## 2019-03-20 DIAGNOSIS — O99810 Abnormal glucose complicating pregnancy: Secondary | ICD-10-CM | POA: Insufficient documentation

## 2019-03-20 NOTE — Progress Notes (Signed)
Gestational Diabetes Medical Nutrition Therapy  Appt Start Time: 41   End Time: 1115  Primary concerns today: Gestational Diabetes and self-diagnosed BED  Referral diagnosis: Gestational Diabetes Preferred learning style: visual Learning readiness: ready   NUTRITION ASSESSMENT   Lifestyle & Dietary Hx Pt states she has anxiety around food and wants to stop having to think about food all the time. Pt reports she does not have typical hunger/fullness cues and eats because she thinks she is supposed to a guesses how much she should be eating. Pt reports in high school she lost 50 lbs following a meal plan provided by a bariatric clinic.   Pt states some days she will not eat until the end of the day and then may eat non-stop until she throws up. Pt states she has had anxiety around food since childhood and has been trying for several years to work on her relationship with food on her own and recently decided she would seek out counseling to help.   Patient states they have been working on improving their son's diet and following the [employer] which encourages structured meal times and respect for food. Pt states rules are 3 items for meals and 2 items for snacks. Pt states her children seem to be doing well with this structure and have had a hard time this past week as she has started changing how she eats because of recent gestational diabetes diagnosis.  Patient states she frequently has symptoms such as shaky, low energy, unfocused.  Current average weekly physical activity: 2-3 days/ 10-15 minutes  24-Hr Dietary Recall Patient diet varies, but since diagnosis has not been eating much of anything.  Sample meals Patient is providing her children: First Meal: 6:00 sausage, egg sandwich Snack: 9:30 protein + nutes Second Meal: sandwich, fruit, veggie, sweet ex: fruit snack Snack: yogurt Third Meal: MyPlate  Snack: ice cream OR cookies & milk Beverages: water,  milk  NUTRITION INTERVENTION  Nutrition education (E-1) on the following topics:  . How insulin and glucose control blood sugar . Timing of checking blood sugar . Alternate ways the body messages hunger and fullness  Accu-chek Guide Me Lot #[id number] Exp: 03/20/2020 Blood Glucose: 150 mg/dL (11 am fasting)  Handouts Provided Include   Glucose log without target ranges  Learning Style & Readiness for Change Teaching method utilized: Visual & Auditory  Demonstrated degree of understanding via: Teach Back  Barriers to learning/adherence to lifestyle change: none  Goals Established by Pt . Eat same meals you are providing your children . Check blood sugar 4x/day and keep log . Return with numbers in 1-2 weeks  MONITORING & EVALUATION Dietary intake, weekly physical activity, and blood sugar in 1-2 weeks.  RD's Notes for Next Visit  . Patient will see Mirian Capuchin for follow-up visit  Next Steps  Patient is to work on not over thinking food choices.

## 2019-04-02 ENCOUNTER — Encounter: Payer: Medicaid Other | Admitting: Registered"

## 2019-04-02 ENCOUNTER — Other Ambulatory Visit: Payer: Self-pay

## 2019-04-02 DIAGNOSIS — O99810 Abnormal glucose complicating pregnancy: Secondary | ICD-10-CM

## 2019-04-02 NOTE — Patient Instructions (Addendum)
-   Try to contact [name] with [employer] at [phone] or Mathis Dad at [phone].   - Continue to eat 3 meals a day with 2 snacks in between.

## 2019-04-02 NOTE — Progress Notes (Signed)
Appointment start time: 4:10  Appointment end time: 5:16  Patient was seen on 04/02/19 for nutrition counseling pertaining to disordered eating  Primary care provider: Allayne Butcher, [place] Therapist: none  ROI: N/A Any other medical team members: Marlow Baars, MD  This note is not being shared with the patient for the following reason: To prevent harm (release of this note would result in harm to the life or physical safety of the patient or another).   Assessment  Pt reports she is trying to make her OBGYN happy; states last visit was Fri, 3/11. States she has increased stress leading to binge episodes or restricting for the purpose of  having "good" BS numbers. States when she eats balanced meals her numbers are increased but when she does not eat, her numbers are ok. States she was told its ok to go 12-14 hours without eating. Pt is most concerned about eating disorder rather than diabetes.   Pt states sometimes she will eat for 2-3 hours straight. Reports noticing her 28 year old is copying her eating behaviors and she doesn't want this to continue to happen.   Reports eating disorders are common on maternal side of family; believes mom had undiagnosed AN. States mom never told her "no" when she was growing up and allowed them to eat whatever they wanted. States her binge episodes began when husband would be out of town 4-6 days/week. Also states during that time her mother-in-law couldn't eat due to cancer. States she would stay with mother-in-law during the day and wouldn't eat in front of her because mother-in-law couldn't eat. Reports afterwards pt would go home and binge. States she does not feel comfortable with food; feels like a chore.   Reports checking BS 4x/day: FBS (107-150), after breakfast (95-135), after lunch (103-154), and after dinner (128-149). States she has been trying to make her meals more balanced. States she overanalyzes each meal. Reports family history of type 2 diabetes.    Lives with husband and 2 children.    Eating history: Length of time: 6 years ago Previous treatments: none reported Goals for RD meetings: normalize relationship with food  Weight history:  Highest weight:    Lowest weight: 230 Most consistent weight:   What would you like to weigh: How has weight changed in the past year:   Medical Information:  Changes in hair, skin, nails since ED started: not reported Chewing/swallowing difficulties not reported Reflux or heartburn not reported Trouble with teeth not reported LMP without the use of hormones: not reported  Weight at that point: not reported Effect of exercise on menses: not reported   Effect of hormones on menses: not reported Constipation, diarrhea: not reported Dizziness/lightheadedness: not reported Headaches/body aches: not reported Heart racing/chest pain: not reported Mood: not reported Sleep: not reported Focus/concentration: not reported Cold intolerance: not reported Vision changes: not reported  Mental health diagnosis:    Dietary assessment: A typical day consists of 1-3 meals and 0 snacks  Safe foods include:  Avoided foods include:  24 hour recall:  B: frozen chicken biscuit + diet Coke S: L: [place] wrap (tortilla, 3 slices of deli [place]) S: D: ramen noodles S:  Beverages:  What Methods Do You Use To Control Your Weight (Compensatory behaviors)?           Restricting (calories, fat, carbs)  SIV  Diet pills  Laxatives  Diuretics  Alcohol or drugs  Exercise (what type)  Food rules or rituals (explain)  Binge Estimated energy  intake: ~800 kcal  Estimated energy needs: 2800-2900 kcal 350-363 g CHO 210-218 g pro 62-64 g fat  Nutrition Diagnosis: NB-1.5 Disordered eating pattern As related to binge eating.  As evidenced by pt reports eating for 2-3 hours at a time.  Intervention/Goals: Pt was educated and counseled on eating to nourish the body, ways to improve nourishment, and the  importance of adding a therapist to the team to work through eating concerns. Discussed continuing to follow recommendations previously established with RD, Levie Heritage. Discussed the importance of being adequately nourished for mom and baby. Pt was in agreement with goals listed.  Goals: - Try to contact Roetta Sessions with [facility] at [phone] or Esperanza Heir at [phone].  - Continue to eat 3 meals a day with 2 snacks in between.   Meal plan:    3 meals    2 snacks  Monitoring and Evaluation: Patient will follow up in 4 weeks due to providers availability. Would ideally see pt in 1 week.

## 2019-04-11 ENCOUNTER — Encounter (HOSPITAL_COMMUNITY): Payer: Self-pay | Admitting: Obstetrics and Gynecology

## 2019-04-11 ENCOUNTER — Inpatient Hospital Stay (HOSPITAL_COMMUNITY)
Admission: AD | Admit: 2019-04-11 | Discharge: 2019-04-11 | Disposition: A | Discharge status: Home / Self Care | Payer: Medicaid Other | Attending: Obstetrics and Gynecology | Admitting: Obstetrics and Gynecology

## 2019-04-11 ENCOUNTER — Other Ambulatory Visit: Payer: Self-pay

## 2019-04-11 DIAGNOSIS — O99212 Obesity complicating pregnancy, second trimester: Secondary | ICD-10-CM | POA: Insufficient documentation

## 2019-04-11 DIAGNOSIS — O162 Unspecified maternal hypertension, second trimester: Secondary | ICD-10-CM | POA: Insufficient documentation

## 2019-04-11 DIAGNOSIS — M545 Low back pain: Secondary | ICD-10-CM | POA: Insufficient documentation

## 2019-04-11 DIAGNOSIS — E669 Obesity, unspecified: Secondary | ICD-10-CM | POA: Diagnosis not present

## 2019-04-11 DIAGNOSIS — O99892 Other specified diseases and conditions complicating childbirth: Secondary | ICD-10-CM

## 2019-04-11 DIAGNOSIS — O99891 Other specified diseases and conditions complicating pregnancy: Secondary | ICD-10-CM

## 2019-04-11 DIAGNOSIS — Z3A15 15 weeks gestation of pregnancy: Secondary | ICD-10-CM | POA: Diagnosis not present

## 2019-04-11 DIAGNOSIS — O26892 Other specified pregnancy related conditions, second trimester: Secondary | ICD-10-CM | POA: Insufficient documentation

## 2019-04-11 DIAGNOSIS — M549 Dorsalgia, unspecified: Secondary | ICD-10-CM | POA: Diagnosis not present

## 2019-04-11 LAB — CBC
HCT: 34.9 % — ABNORMAL LOW (ref 36.0–46.0)
Hemoglobin: 11 g/dL — ABNORMAL LOW (ref 12.0–15.0)
MCH: 25.3 pg — ABNORMAL LOW (ref 26.0–34.0)
MCHC: 31.5 g/dL (ref 30.0–36.0)
MCV: 80.4 fL (ref 80.0–100.0)
Platelets: 286 10*3/uL (ref 150–400)
RBC: 4.34 MIL/uL (ref 3.87–5.11)
RDW: 14.6 % (ref 11.5–15.5)
WBC: 7.8 10*3/uL (ref 4.0–10.5)
nRBC: 0 % (ref 0.0–0.2)

## 2019-04-11 LAB — COMPREHENSIVE METABOLIC PANEL
ALT: 24 U/L (ref 0–44)
AST: 28 U/L (ref 15–41)
Albumin: 3.1 g/dL — ABNORMAL LOW (ref 3.5–5.0)
Alkaline Phosphatase: 57 U/L (ref 38–126)
Anion gap: 10 (ref 5–15)
BUN: <5 mg/dL — ABNORMAL LOW (ref 6–20)
CO2: 22 mmol/L (ref 22–32)
Calcium: 9.2 mg/dL (ref 8.9–10.3)
Chloride: 106 mmol/L (ref 98–111)
Creatinine, Ser: 0.53 mg/dL (ref 0.44–1.00)
GFR calc Af Amer: >60 mL/min (ref >60)
GFR calc non Af Amer: >60 mL/min (ref >60)
Glucose, Bld: 97 mg/dL (ref 70–99)
Potassium: 3.9 mmol/L (ref 3.5–5.1)
Sodium: 138 mmol/L (ref 135–145)
Total Bilirubin: 0.4 mg/dL (ref 0.3–1.2)
Total Protein: 6.9 g/dL (ref 6.5–8.1)

## 2019-04-11 LAB — URINALYSIS, ROUTINE W REFLEX MICROSCOPIC
Bilirubin Urine: NEGATIVE
Glucose, UA: NEGATIVE mg/dL
Hgb urine dipstick: NEGATIVE
Ketones, ur: 20 mg/dL — AB
Nitrite: NEGATIVE
Protein, ur: 30 mg/dL — AB
Specific Gravity, Urine: 1.021 (ref 1.005–1.030)
pH: 5 (ref 5.0–8.0)

## 2019-04-11 LAB — PROTEIN / CREATININE RATIO, URINE
Creatinine, Urine: 185.62 mg/dL
Protein Creatinine Ratio: 0.17 mg/mg{Cre} — ABNORMAL HIGH (ref 0.00–0.15)
Total Protein, Urine: 31 mg/dL

## 2019-04-11 LAB — POCT PREGNANCY, URINE: Preg Test, Ur: POSITIVE — AB

## 2019-04-11 MED ORDER — CYCLOBENZAPRINE HCL 10 MG PO TABS: 10.0000 mg | ORAL_TABLET | Freq: Three times a day (TID) | ORAL | 0 refills | Status: DC | PRN

## 2019-04-11 MED ORDER — ACETAMINOPHEN 325 MG PO TABS
650.0000 mg | ORAL_TABLET | Freq: Once | ORAL | Status: AC
  Administered 2019-04-11: 650 mg via ORAL
  Filled 2019-04-11: qty 2

## 2019-04-11 MED ORDER — CYCLOBENZAPRINE HCL 5 MG PO TABS
10.0000 mg | ORAL_TABLET | Freq: Once | ORAL | Status: AC
  Administered 2019-04-11: 10 mg via ORAL
  Filled 2019-04-11: qty 2

## 2019-04-11 NOTE — MAU Provider Note (Signed)
History     CSN: [id number]  Arrival date and time: 04/11/19 1150   First Provider Initiated Contact with Patient 04/11/19 1442      Chief Complaint  Patient presents with  . Back Pain   HPI Marilyn Cooper 29 y.o. [redacted]w[redacted]d Client of [facility].  Comes to MAU today with right sided low back pain that began on Wednesday at 2:00 am.  Has tried tylenol for 24 hours and still is in pain today so she came to MAU.  Did not want to try to handle this pain for a second day.  Pain is stabbing at times and then other times is a burning pain.  If lying on her side and pulling leg up, pain is somewhat better.  Blood pressure has not been elevated in this pregnancy.  OB History    Gravida  6   Para  2   Term  1   Preterm  1   AB  3   Living  2     SAB      TAB  3   Ectopic      Multiple  0   Live Births  2           Past Medical History:  Diagnosis Date  . Anemia   . Anxiety   . Asthma    childhood  . Depression    see social work consult  . Hx of varicella   . Obese   . Pregnancy induced hypertension     Past Surgical History:  Procedure Laterality Date  . CESAREAN SECTION N/A 03/09/2012   Procedure: CESAREAN SECTION;  Surgeon: Farrel Gobble. Harrington Challenger, MD;  Location: [facility];  Service: Obstetrics;  Laterality: N/A;  . CESAREAN SECTION N/A 12/03/2014   Procedure: CESAREAN SECTION;  Surgeon: Jerelyn Charles, MD;  Location: [facility];  Service: Obstetrics;  Laterality: N/A;  . TONSILLECTOMY    . UMBILICAL HERNIA REPAIR N/A 06/21/2014   Procedure: EXPLORATORY LAPAROTOMY,  SMALL BOWEL RESECTION, PRIMARY REPAIR INCARCERATED VENTRAL HERNIA;  Surgeon: Armandina Gemma, MD;  Location: [facility];  Service: General;  Laterality: N/A;    Family History  Problem Relation Age of Onset  . Anemia Mother   . Anemia Sister   . Birth defects Brother        heart disorder?  Marland Kitchen Hypertension Maternal Grandmother   . Diabetes Maternal Grandmother   . Hypertension Maternal Grandfather   .  Diabetes Maternal Grandfather   . Hypertension Paternal Grandmother   . Hypertension Paternal Grandfather     Social History   Tobacco Use  . Smoking status: Never Smoker  . Smokeless tobacco: Never Used  Substance Use Topics  . Alcohol use: No  . Drug use: No    Allergies: No Known Allergies  Medications Prior to Admission  Medication Sig Dispense Refill Last Dose  . aspirin 325 MG tablet Take 325 mg by mouth daily.   04/10/2019 at Unknown time  . metFORMIN (GLUCOPHAGE) 500 MG tablet Take by mouth 2 (two) times daily with a meal.   04/10/2019 at Unknown time  . Prenatal Vit-Fe Fumarate-FA (PRENATAL MULTIVITAMIN) TABS tablet Take 1 tablet by mouth daily at 12 noon.   04/10/2019 at Unknown time  . amLODipine (NORVASC) 10 MG tablet Take 1 tablet (10 mg total) by mouth daily. 90 tablet 3  at not taking  . clonazePAM (KLONOPIN) 0.5 MG tablet Take 1 tablet (0.5 mg total) by mouth daily as needed for anxiety. [place]  tablet 0  at not taking  . enalapril (VASOTEC) 20 MG tablet TAKE 1 TABLET BY MOUTH EVERY DAY 30 tablet 3  at not taking  . escitalopram (LEXAPRO) 10 MG tablet TAKE 1 TABLET BY MOUTH EVERY DAY 30 tablet 1  at not taking  . IRON PO Take 325 mg by mouth 3 (three) times daily.     at not taking    Review of Systems  Constitutional: Negative for fever.  Respiratory: Negative for cough and shortness of breath.   Cardiovascular: Negative for leg swelling.  Gastrointestinal: Negative for abdominal pain, constipation, diarrhea, nausea and vomiting.  Genitourinary: Negative for dysuria, vaginal bleeding and vaginal discharge.  Musculoskeletal: Positive for back pain.   Physical Exam   Blood pressure (!) 143/79, pulse (!) 106, temperature 98.2 F (36.8 C), resp. rate 16, height 5\' 1"  (1.549 m), weight (!) 140.6 kg, last menstrual period 12/22/2018, SpO2 100 %.  Physical Exam  Nursing note and vitals reviewed. Constitutional: She is oriented to person, place, and time. She appears  well-developed.  Morbid obesity  HENT:  Head: Normocephalic.  Eyes: EOM are normal.  GI: Soft. There is no abdominal tenderness. There is no rebound and no guarding.  Informal bedside 14/05/2018 done and FHT was 141  Musculoskeletal:        General: No edema. Normal range of motion.     Cervical back: Neck supple.     Comments: Pain in low back on right side.   Pain is relieved somewhat with deep pressure to the area.  Neurological: She is alert and oriented to person, place, and time.  Skin: Skin is warm and dry.  Psychiatric: She has a normal mood and affect.    MAU Course  Procedures  MDM BP on arrival to MAU 172/116.  Resting in bed prior to pain medication, BPs were 156/88 to 161/101.  Once she had flexeril and her pain was relieved, BP was 143/79.  To follow up in the office tomorrow at her previously scheduled visit.  Baseline labs done today.  Assessment and Plan  Low back pain, right Obesity in pregnancy Hypertension Gestational diabetes?  Plan Has an appointment scheduled in the office tomorrow at 8:30 am - go to this appointment Flexeril 10 mg PO  TID PRN Will send home today.  PR ration and CMP are still pending.  Mysti Haley L Ariany Kesselman 04/11/2019, 4:17 PM

## 2019-04-11 NOTE — Discharge Instructions (Signed)
Keep your appointment in the office. You may need a physical therapy referral if you pain is prolonged. Take Tylenol 325 mg 2 tablets by mouth every 4 hours if needed for pain. Use an ice pack for 15 minutes at a time.  Do not put directly on your skin.

## 2019-04-11 NOTE — MAU Note (Signed)
.  Marilyn Cooper is a 29 y.o. at [redacted]w[redacted]d here in MAU reporting: lower back pain since yesterday morning around 230am. Took tylenol this morning at 6am. Denies any abnormal discharge LMP: 12/29/18 Onset of complaint: 04/10/19 0230 Pain score: 8 There were no vitals filed for this visit.   FHT: Lab orders placed from triage: UA

## 2019-04-12 LAB — CULTURE, OB URINE: Special Requests: NORMAL

## 2019-04-15 ENCOUNTER — Telehealth: Payer: Self-pay | Admitting: Registered"

## 2019-04-15 NOTE — Telephone Encounter (Signed)
Called patient to check in to see how she is doing. Patient states she had a good conversation with Dr. Chestine Spore on Friday and has increased metformin. Pt states over the weekend all of her post meal numbers are within range and her FBS is trending down, below 110 mg/dL.  Pt reports she has a referral in at [facility] to see [employer]. Pt reports she has followed up with all the names given to her and has left messages.   Patient states moving forward she wants to keep working with [facility] and have me give input when needed regarding blood sugar.

## 2019-05-02 ENCOUNTER — Ambulatory Visit: Payer: Medicaid Other | Admitting: Registered"

## 2019-05-22 ENCOUNTER — Other Ambulatory Visit: Payer: Self-pay | Admitting: *Deleted

## 2019-05-22 ENCOUNTER — Encounter: Payer: Medicaid Other | Attending: Obstetrics | Admitting: Registered"

## 2019-05-22 ENCOUNTER — Encounter: Payer: Self-pay | Admitting: Registered"

## 2019-05-22 ENCOUNTER — Other Ambulatory Visit: Payer: Self-pay

## 2019-05-22 DIAGNOSIS — Z6841 Body Mass Index (BMI) 40.0 and over, adult: Secondary | ICD-10-CM

## 2019-05-22 DIAGNOSIS — Z713 Dietary counseling and surveillance: Secondary | ICD-10-CM

## 2019-05-22 DIAGNOSIS — O99810 Abnormal glucose complicating pregnancy: Secondary | ICD-10-CM | POA: Insufficient documentation

## 2019-05-22 NOTE — Patient Instructions (Signed)
-   Challenge eating disorder thoughts.  - Try [facility] [place].

## 2019-05-22 NOTE — Progress Notes (Signed)
Appointment start time: 3:05  Appointment end time: 4:05  Patient was seen on 05/23/19 for nutrition counseling pertaining to disordered eating  Primary care provider: Allayne Butcher, [place] Therapist: none  ROI: N/A Any other medical team members: Marlow Baars, MD  This note is not being shared with the patient for the following reason: To prevent harm (release of this note would result in harm to the life or physical safety of the patient or another).   Assessment  States she has reached out to therapist and not getting any return phone calls. States her bp has increased recently. States she has been eating mostly 3 meals/day. States she hit a roadblock with sister in law being at restaurant recently when she and husband were there to eat. States she didn't eat because of previous negative comments from sister in law. States food is a challenge when things are not right at home. States she puts herself last. States she put herself on 1500 calories/day. States it works for her when being told whats good and whats not to feel confident for her to know what to eat. States she needs to use a lot of logic to talk down eating disorder thoughts related to good foods/bad foods. States husband helps when home to push her to eat.    Previous visits: Pt states sometimes she will eat for 2-3 hours straight. Reports noticing her 28 year old is copying her eating behaviors and she doesn't want this to continue to happen.   Reports eating disorders are common on maternal side of family; believes mom had undiagnosed AN. States mom never told her "no" when she was growing up and allowed them to eat whatever they wanted. States her binge episodes began when husband would be out of town 4-6 days/week. Also states during that time her mother-in-law couldn't eat due to cancer. States she would stay with mother-in-law during the day and wouldn't eat in front of her because mother-in-law couldn't eat. Reports afterwards pt would  go home and binge. States she does not feel comfortable with food; feels like a chore.   Reports checking BS 4x/day: FBS (107-150), after breakfast (95-135), after lunch (103-154), and after dinner (128-149). States she has been trying to make her meals more balanced. States she overanalyzes each meal. Reports family history of type 2 diabetes.   Lives with husband and 2 children.    Eating history: Length of time: 6 years ago Previous treatments: none reported Goals for RD meetings: normalize relationship with food  Weight history:  Highest weight:    Lowest weight: 230 Most consistent weight:   What would you like to weigh: How has weight changed in the past year:   Medical Information:  Changes in hair, skin, nails since ED started: not reported Chewing/swallowing difficulties not reported Reflux or heartburn not reported Trouble with teeth not reported LMP without the use of hormones: not reported  Weight at that point: not reported Effect of exercise on menses: not reported   Effect of hormones on menses: not reported Constipation, diarrhea: not reported Dizziness/lightheadedness: not reported Headaches/body aches: not reported Heart racing/chest pain: not reported Mood: not reported Sleep: not reported Focus/concentration: not reported Cold intolerance: not reported Vision changes: not reported  Mental health diagnosis:    Dietary assessment: A typical day consists of 1-3 meals and 0 snacks  Safe foods include:  Avoided foods include:  24 hour recall:  B: frozen chicken biscuit + diet Coke S: L: PBJ sandwich S: D:  dinner at mother in laws home + cake + ice cream S:  Beverages:  What Methods Do You Use To Control Your Weight (Compensatory behaviors)?           Restricting (calories, fat, carbs)  SIV  Diet pills  Laxatives  Diuretics  Alcohol or drugs  Exercise (what type)  Food rules or rituals (explain)  Binge Estimated energy intake: 1300-1500  kcal  Estimated energy needs: 2800-2900 kcal 350-363 g CHO 210-218 g pro 62-64 g fat  Nutrition Diagnosis: NB-1.5 Disordered eating pattern As related to binge eating.  As evidenced by pt reports eating for 2-3 hours at a time.  Intervention/Goals: Pt was educated and counseled on eating to nourish the body, the importance of being adequately nourished for mom and baby, and finding a therapist. Discussed skills to challenge thoughts of good vs bad foods. Pt was in agreement with goals listed.  Goals: - Challenge eating disorder thoughts. - Try [facility] [place].   Meal plan:    3 meals    2 snacks  Monitoring and Evaluation: Patient will follow up in 4 weeks due to providers availability. Would ideally see pt in 1 week.

## 2019-05-24 ENCOUNTER — Other Ambulatory Visit: Payer: Self-pay

## 2019-05-24 ENCOUNTER — Ambulatory Visit: Payer: Medicaid Other | Attending: Obstetrics and Gynecology

## 2019-05-24 ENCOUNTER — Ambulatory Visit: Payer: Medicaid Other | Admitting: *Deleted

## 2019-05-24 ENCOUNTER — Other Ambulatory Visit: Payer: Self-pay | Admitting: *Deleted

## 2019-05-24 VITALS — BP 159/96 | HR 113 | Temp 97.1°F

## 2019-05-24 DIAGNOSIS — Z7984 Long term (current) use of oral hypoglycemic drugs: Secondary | ICD-10-CM | POA: Insufficient documentation

## 2019-05-24 DIAGNOSIS — Z6841 Body Mass Index (BMI) 40.0 and over, adult: Secondary | ICD-10-CM

## 2019-05-24 DIAGNOSIS — O24415 Gestational diabetes mellitus in pregnancy, controlled by oral hypoglycemic drugs: Secondary | ICD-10-CM | POA: Insufficient documentation

## 2019-05-24 DIAGNOSIS — Z79899 Other long term (current) drug therapy: Secondary | ICD-10-CM | POA: Diagnosis not present

## 2019-05-24 DIAGNOSIS — O10012 Pre-existing essential hypertension complicating pregnancy, second trimester: Secondary | ICD-10-CM | POA: Insufficient documentation

## 2019-05-24 DIAGNOSIS — O99212 Obesity complicating pregnancy, second trimester: Secondary | ICD-10-CM

## 2019-05-24 DIAGNOSIS — Z3A20 20 weeks gestation of pregnancy: Secondary | ICD-10-CM | POA: Diagnosis not present

## 2019-05-24 DIAGNOSIS — O34219 Maternal care for unspecified type scar from previous cesarean delivery: Secondary | ICD-10-CM

## 2019-05-24 DIAGNOSIS — O0990 Supervision of high risk pregnancy, unspecified, unspecified trimester: Secondary | ICD-10-CM

## 2019-05-24 DIAGNOSIS — Z363 Encounter for antenatal screening for malformations: Secondary | ICD-10-CM | POA: Diagnosis not present

## 2019-05-27 ENCOUNTER — Encounter: Payer: Self-pay | Admitting: Obstetrics

## 2019-06-18 ENCOUNTER — Encounter: Payer: Medicaid Other | Attending: Obstetrics | Admitting: Registered"

## 2019-06-18 ENCOUNTER — Encounter: Payer: Self-pay | Admitting: Registered"

## 2019-06-18 ENCOUNTER — Other Ambulatory Visit: Payer: Self-pay

## 2019-06-18 DIAGNOSIS — O99810 Abnormal glucose complicating pregnancy: Secondary | ICD-10-CM | POA: Diagnosis not present

## 2019-06-18 DIAGNOSIS — Z713 Dietary counseling and surveillance: Secondary | ICD-10-CM

## 2019-06-18 NOTE — Progress Notes (Signed)
Appointment start time: 3:05  Appointment end time: 3:55  Patient was seen on 06/18/2019 for nutrition counseling pertaining to disordered eating  Primary care provider: Dena Billet, [place] Therapist: none  ROI: N/A Any other medical team members: Jerelyn Charles, MD  This note is not being shared with the patient for the following reason: To prevent harm (release of this note would result in harm to the life or physical safety of the patient or another).   Assessment  States she has a new therapist appointment on 6/15 with [facility]. States she is struggling to add in a snack after dinner because she is not used to eating after dinner. States that is when she will binge eat and wants to stay from that. States gynecologist introduced an evening snack because BS was dropping between midnight and 2 am. States she goes to bed in 90's and wakes up and its in 130's. States she wants to get away with as little as possible. States she went to bed last night 9-10 pm because she was tired due to staying up late the night before. Reports she was told to aim for FBS numbers <110. OBGYN told her to check BS at 2am. States she has been trying to avoid carbohydrates due to diabetes. States she is not eating every 2-3 hours. States yesterday was a good day related to eating every 2-3 hours. States she struggles with pasta although she likes it. Plans to have it this week along with meat and salad.   States she overanalyzes each meal. Reports family history of type 2 diabetes.   Lives with husband and 2 children.    Eating history: Length of time: 6 years ago Previous treatments: none reported Goals for RD meetings: normalize relationship with food  Weight history:  Highest weight:    Lowest weight: 230 Most consistent weight:   What would you like to weigh: How has weight changed in the past year:   Medical Information:  Changes in hair, skin, nails since ED started: not  reported Chewing/swallowing difficulties not reported Reflux or heartburn not reported Trouble with teeth not reported LMP without the use of hormones: not reported  Weight at that point: not reported Effect of exercise on menses: not reported   Effect of hormones on menses: not reported Constipation, diarrhea: not reported Dizziness/lightheadedness: not reported Headaches/body aches: not reported Heart racing/chest pain: not reported Mood: not reported Sleep: not reported Focus/concentration: not reported Cold intolerance: not reported Vision changes: not reported  Mental health diagnosis:    Dietary assessment: A typical day consists of 1-3 meals and 1 snacks  Safe foods include:  Avoided foods include:  24 hour recall:  B (11:30 am): eggs + 1/2 sausage gravy and biscuit (BS 90) S (2 pm): summer sausage  L:  S: D (5 pm): sausage brats + pasta salad (BS 96) S (7:30 pm): sugar free m&m's  Beverages: water (~97 oz), 2 cans of Dr. Malachi Bonds zero  What Methods Do You Use To Control Your Weight (Compensatory behaviors)?           Restricting (calories, fat, carbs)  SIV  Diet pills  Laxatives  Diuretics  Alcohol or drugs  Exercise (what type)  Food rules or rituals (explain)  Binge  Estimated energy needs: 2800-2900 kcal 350-363 g CHO 210-218 g pro 62-64 g fat  Nutrition Diagnosis: NB-1.5 Disordered eating pattern As related to binge eating.  As evidenced by pt reports eating for 2-3 hours at  a time.  Intervention/Goals: Pt was educated and counseled on eating to nourish the body, the importance of being adequately nourished for mom and baby, and balanced snack ideas. Pt was in agreement with goals listed.  Goals: - Snacks can include carbohydrates + protein such as: peanut butter crackers, cheese and crackers, crackers and summer sausage, greek yogurt at bedtime.   Meal plan:    3 meals    2 snacks  Monitoring and Evaluation: Patient will follow up in 2 weeks.

## 2019-06-18 NOTE — Patient Instructions (Addendum)
-   Snacks can include carbohydrates + protein such as: peanut butter crackers, cheese and crackers, crackers and summer sausage, greek yogurt at bedtime.

## 2019-06-21 ENCOUNTER — Ambulatory Visit: Payer: Medicaid Other | Admitting: *Deleted

## 2019-06-21 ENCOUNTER — Ambulatory Visit: Payer: Medicaid Other | Attending: Obstetrics and Gynecology

## 2019-06-21 ENCOUNTER — Other Ambulatory Visit: Payer: Self-pay

## 2019-06-21 ENCOUNTER — Other Ambulatory Visit: Payer: Self-pay | Admitting: *Deleted

## 2019-06-21 VITALS — BP 164/106 | HR 112

## 2019-06-21 DIAGNOSIS — O10012 Pre-existing essential hypertension complicating pregnancy, second trimester: Secondary | ICD-10-CM

## 2019-06-21 DIAGNOSIS — O34219 Maternal care for unspecified type scar from previous cesarean delivery: Secondary | ICD-10-CM

## 2019-06-21 DIAGNOSIS — Z362 Encounter for other antenatal screening follow-up: Secondary | ICD-10-CM | POA: Diagnosis not present

## 2019-06-21 DIAGNOSIS — O10919 Unspecified pre-existing hypertension complicating pregnancy, unspecified trimester: Secondary | ICD-10-CM

## 2019-06-21 DIAGNOSIS — Z3A24 24 weeks gestation of pregnancy: Secondary | ICD-10-CM

## 2019-06-21 DIAGNOSIS — E669 Obesity, unspecified: Secondary | ICD-10-CM

## 2019-06-21 DIAGNOSIS — O99212 Obesity complicating pregnancy, second trimester: Secondary | ICD-10-CM | POA: Diagnosis not present

## 2019-07-02 ENCOUNTER — Encounter: Payer: Medicaid Other | Admitting: Registered"

## 2019-07-02 ENCOUNTER — Encounter: Payer: Self-pay | Admitting: Registered"

## 2019-07-02 ENCOUNTER — Other Ambulatory Visit: Payer: Self-pay

## 2019-07-02 DIAGNOSIS — O99810 Abnormal glucose complicating pregnancy: Secondary | ICD-10-CM | POA: Diagnosis not present

## 2019-07-02 DIAGNOSIS — Z713 Dietary counseling and surveillance: Secondary | ICD-10-CM

## 2019-07-02 NOTE — Progress Notes (Signed)
Appointment start time: 3:08  Appointment end time: 3:55  Patient was seen on 07/02/2019 for nutrition counseling pertaining to disordered eating  Primary care provider: Allayne Butcher, [place] Therapist: none  ROI: N/A Any other medical team members: Marlow Baars, MD  This note is not being shared with the patient for the following reason: To prevent harm (release of this note would result in harm to the life or physical safety of the patient or another).   Assessment  States she has been doing ok with snacks. Notice she is not in the mood to eat. States she is more focused on cleaning and preparing for baby to come and will go without eating and not realize it.  Pt is very preoccupied with keeping numbers within range and will go without eating. States she needs to know what to eat for meals. States if she has to think about it then she gives up. States sometimes her family will not eat dinner when she's ready to have dinner when she's ready.   States therapist appt was canceled today, 6/15 with [facility] [place]. Needs to call and reschedule.   Previous appt: Reports she was told to aim for FBS numbers <110. OBGYN told her to check BS at 2am. States she has been trying to avoid carbohydrates due to diabetes. States she is not eating every 2-3 hours. States yesterday was a good day related to eating every 2-3 hours. States she struggles with pasta although she likes it. Plans to have it this week along with meat and salad.   States she overanalyzes each meal. Reports family history of type 2 diabetes.   Lives with husband and 2 children.    Eating history: Length of time: 6 years ago Previous treatments: none reported Goals for RD meetings: normalize relationship with food  Weight history:  Highest weight:    Lowest weight: 230 Most consistent weight:   What would you like to weigh: How has weight changed in the past year:   Medical Information:  Changes in hair, skin,  nails since ED started: not reported Chewing/swallowing difficulties not reported Reflux or heartburn not reported Trouble with teeth not reported LMP without the use of hormones: not reported  Weight at that point: not reported Effect of exercise on menses: not reported   Effect of hormones on menses: not reported Constipation, diarrhea: not reported Dizziness/lightheadedness: not reported Headaches/body aches: not reported Heart racing/chest pain: not reported Mood: not reported Sleep: not reported Focus/concentration: not reported Cold intolerance: not reported Vision changes: not reported  Mental health diagnosis:    Dietary assessment: A typical day consists of 1-3 meals and 1 snacks  Safe foods include:  Avoided foods include:  24 hour recall:  B (10 am): bacon, egg, cheese bagel (BS 90) S:   L (2 pm): summer sausage + cheese + crackers  S (4 pm): pineapple + granola bars D (7 pm): taco salad (BS 96) S (11 pm): milk + 4 cookies + sugar-free pudding  Beverages: water (~97 oz), 2 cans of Dr. Reino Kent zero  What Methods Do You Use To Control Your Weight (Compensatory behaviors)?           Restricting (calories, fat, carbs)  SIV  Diet pills  Laxatives  Diuretics  Alcohol or drugs  Exercise (what type)  Food rules or rituals (explain)  Binge  Estimated energy needs: 2800-2900 kcal 350-363 g CHO 210-218 g pro 62-64 g fat  Nutrition Diagnosis: NB-1.5 Disordered eating pattern  As related to binge eating.  As evidenced by pt reports eating for 2-3 hours at a time.  Intervention/Goals: Mainly listened. Discussed meal options and encouraged to eat throughout the day. Pt was in agreement with goals listed.  Goals: - Aim to eat at 8:30 am (breakfast), 10:30 am (snack), 1 pm (lunch), 4 pm (snack), 7 pm (dinner), 10 pm (snack, as needed).  - Aim to eat every 3-5 hours. - Make time for lunch.   Meal plan:    3 meals    2 snacks  Monitoring and Evaluation: Patient  will follow up in 2 weeks.

## 2019-07-02 NOTE — Patient Instructions (Signed)
-   Aim to eat at 8:30 am (breakfast), 10:30 am (snack), 1 pm (lunch), 4 pm (snack), 7 pm (dinner), 10 pm (snack, as needed).   - Aim to eat every 3-5 hours.  - Make time for lunch.

## 2019-07-11 ENCOUNTER — Other Ambulatory Visit: Payer: Self-pay

## 2019-07-11 ENCOUNTER — Ambulatory Visit: Payer: Medicaid Other | Admitting: *Deleted

## 2019-07-11 ENCOUNTER — Other Ambulatory Visit: Payer: Self-pay | Admitting: *Deleted

## 2019-07-11 ENCOUNTER — Ambulatory Visit: Payer: Medicaid Other | Attending: Obstetrics and Gynecology

## 2019-07-11 VITALS — BP 142/83 | HR 97

## 2019-07-11 DIAGNOSIS — O24415 Gestational diabetes mellitus in pregnancy, controlled by oral hypoglycemic drugs: Secondary | ICD-10-CM

## 2019-07-11 DIAGNOSIS — Z3A27 27 weeks gestation of pregnancy: Secondary | ICD-10-CM

## 2019-07-11 DIAGNOSIS — Z362 Encounter for other antenatal screening follow-up: Secondary | ICD-10-CM

## 2019-07-11 DIAGNOSIS — O99212 Obesity complicating pregnancy, second trimester: Secondary | ICD-10-CM

## 2019-07-11 DIAGNOSIS — O10919 Unspecified pre-existing hypertension complicating pregnancy, unspecified trimester: Secondary | ICD-10-CM | POA: Diagnosis not present

## 2019-07-11 DIAGNOSIS — O10913 Unspecified pre-existing hypertension complicating pregnancy, third trimester: Secondary | ICD-10-CM | POA: Diagnosis not present

## 2019-07-11 DIAGNOSIS — O34219 Maternal care for unspecified type scar from previous cesarean delivery: Secondary | ICD-10-CM

## 2019-07-16 ENCOUNTER — Encounter: Payer: Self-pay | Admitting: Registered"

## 2019-07-16 ENCOUNTER — Encounter: Payer: Medicaid Other | Admitting: Registered"

## 2019-07-16 ENCOUNTER — Other Ambulatory Visit: Payer: Self-pay

## 2019-07-16 DIAGNOSIS — Z713 Dietary counseling and surveillance: Secondary | ICD-10-CM

## 2019-07-16 DIAGNOSIS — O99810 Abnormal glucose complicating pregnancy: Secondary | ICD-10-CM | POA: Diagnosis not present

## 2019-07-16 NOTE — Progress Notes (Signed)
Appointment start time: 3:00  Appointment end time: 3:48  Patient was seen on 07/16/2019 for nutrition counseling pertaining to disordered eating  Primary care provider: Allayne Butcher, [place] Therapist: none  ROI: N/A Any other medical team members: Marilyn Baars, MD  This note is not being shared with the patient for the following reason: To prevent harm (release of this note would result in harm to the life or physical safety of the patient or another).   Assessment  States she went to dentist earlier today. States she started new medication, Labetalol, and now eating meals and snacks. Aiming to eat 6 times/day. States when she eats breakfast, things will go well for her with eating for the remainder of the day.   States she is checking BS 4x/day: FBS (100-113) and 2 hrs after meals (70-122). Has noticed lower BS numbers 70-93. Feels that numbers are lower. Feels "off" and hungry. Will treat with cashew bar she keeps in her purse that brings BS numbers up to 120.  States she has increased activity level by doing more things such as family outings to local parks. Starting to have water bottle (40 oz) on her daily to help stay hydrated.  States she has felt her best within the last few weeks. States she no longer has guilt when eating.   States she had A1c was done 6/23. Reports previous A1c was 6.7. Has next appointment on 7/2. Reports she had high BP at appointment last week. Started taking Labetalol as a result.   Previous appt: States therapist appt was canceled today, 6/15 with [facility] of the [place]. Needs to call and reschedule. Reports she was told to aim for FBS numbers <110. OBGYN told her to check BS at 2am. States she has been trying to avoid carbohydrates due to diabetes.  States she overanalyzes each meal. Reports family history of type 2 diabetes.   Lives with husband and 2 children.    Eating history: Length of time: 6 years ago Previous treatments: none  reported Goals for RD meetings: normalize relationship with food  Weight history:  Highest weight:    Lowest weight: 230 Most consistent weight:   What would you like to weigh: How has weight changed in the past year:   Medical Information:  Changes in hair, skin, nails since ED started: not reported Chewing/swallowing difficulties not reported Reflux or heartburn not reported Trouble with teeth not reported LMP without the use of hormones: not reported  Weight at that point: not reported Effect of exercise on menses: not reported   Effect of hormones on menses: not reported Constipation, diarrhea: not reported Dizziness/lightheadedness: not reported Headaches/body aches: not reported Heart racing/chest pain: not reported Mood: not reported Sleep: not reported Focus/concentration: not reported Cold intolerance: not reported Vision changes: not reported  Mental health diagnosis:    Dietary assessment: A typical day consists of 1-3 meals and 1 snacks  Safe foods include:  Avoided foods include:  24 hour recall:  B (10 am): cereal or bacon, egg, cheese bagel (BS 90) S: summer sausage + cheese L (2 pm): Mexican- rice, chicken, cheese, vegetables  S (4 pm): cashew granola bar + cheese stick D (7 pm): Korean beef + noodles S (11 pm): none  Beverages: water (~97 oz), 2 cans of Dr. Reino Kent zero  What Methods Do You Use To Control Your Weight (Compensatory behaviors)?           Restricting (calories, fat, carbs)  SIV  Diet pills  Laxatives  Diuretics  Alcohol or drugs  Exercise (what type)  Food rules or rituals (explain)  Binge  Estimated energy needs: 2800-2900 kcal 350-363 g CHO 210-218 g pro 62-64 g fat  Nutrition Diagnosis: NB-1.5 Disordered eating pattern As related to binge eating.  As evidenced by pt reports eating for 2-3 hours at a time.  Intervention/Goals: Mainly listened. Encouraged pt with eating more often throughout the day to help nourish mom and  baby as well as checking BS numbers. Discussed fluid intake recommendations and ways to have adequate fluid intake. Pt was in agreement with goals listed.  Goals: - Great job eating throughout the day!  - Great job checking blood sugar numbers during day! - Continue to stay hydrated. Aim to drink at least 96 oz of fluids/day.    Meal plan:    3 meals    2 snacks  Monitoring and Evaluation: Patient will follow up in 4 weeks.

## 2019-07-16 NOTE — Patient Instructions (Addendum)
-   Great job eating throughout the day!   - Great job checking blood sugar numbers during day!  - Continue to stay hydrated. Aim to drink at least 96 oz of fluids/day.

## 2019-08-08 ENCOUNTER — Other Ambulatory Visit: Payer: Self-pay

## 2019-08-08 ENCOUNTER — Other Ambulatory Visit: Payer: Self-pay | Admitting: *Deleted

## 2019-08-08 ENCOUNTER — Ambulatory Visit: Payer: Medicaid Other | Attending: Obstetrics and Gynecology

## 2019-08-08 ENCOUNTER — Ambulatory Visit: Payer: Medicaid Other | Admitting: *Deleted

## 2019-08-08 VITALS — BP 133/81 | HR 107

## 2019-08-08 DIAGNOSIS — O34219 Maternal care for unspecified type scar from previous cesarean delivery: Secondary | ICD-10-CM | POA: Diagnosis not present

## 2019-08-08 DIAGNOSIS — O24415 Gestational diabetes mellitus in pregnancy, controlled by oral hypoglycemic drugs: Secondary | ICD-10-CM | POA: Diagnosis not present

## 2019-08-08 DIAGNOSIS — Z362 Encounter for other antenatal screening follow-up: Secondary | ICD-10-CM | POA: Diagnosis not present

## 2019-08-08 DIAGNOSIS — Z3A31 31 weeks gestation of pregnancy: Secondary | ICD-10-CM

## 2019-08-08 DIAGNOSIS — O10919 Unspecified pre-existing hypertension complicating pregnancy, unspecified trimester: Secondary | ICD-10-CM | POA: Insufficient documentation

## 2019-08-08 DIAGNOSIS — O10013 Pre-existing essential hypertension complicating pregnancy, third trimester: Secondary | ICD-10-CM | POA: Diagnosis not present

## 2019-08-08 DIAGNOSIS — O99212 Obesity complicating pregnancy, second trimester: Secondary | ICD-10-CM

## 2019-08-08 DIAGNOSIS — E669 Obesity, unspecified: Secondary | ICD-10-CM

## 2019-08-13 ENCOUNTER — Encounter: Payer: Self-pay | Admitting: Registered"

## 2019-08-13 ENCOUNTER — Encounter: Payer: Medicaid Other | Attending: Obstetrics | Admitting: Registered"

## 2019-08-13 ENCOUNTER — Other Ambulatory Visit: Payer: Self-pay

## 2019-08-13 DIAGNOSIS — Z713 Dietary counseling and surveillance: Secondary | ICD-10-CM

## 2019-08-13 DIAGNOSIS — O99810 Abnormal glucose complicating pregnancy: Secondary | ICD-10-CM | POA: Insufficient documentation

## 2019-08-13 NOTE — Progress Notes (Addendum)
Appointment start time: 3:15  Appointment end time: 4:15  Patient was seen on 08/13/2019 for nutrition counseling pertaining to disordered eating  Primary care provider: Dena Billet, [place] Therapist: none  ROI: N/A Any other medical team members: Jerelyn Charles, MD  This note is not being shared with the patient for the following reason: To prevent harm (release of this note would result in harm to the life or physical safety of the patient or another).   Assessment  States she received bad news yesterday about a close family friend possibly being diagnosed with cancer. States she didn't feel like she had control last night and binged later after not knowing how to handle emotions of news.  States she will discuss with therapist on 8/3.  States she is checking BS 4x/day: FBS (106-111) and 2 hrs after meals (110-124). States recent A1c was 5.7-5.8; decreased from 6.7. Checks BS 5x/day: FBS, after meals, and at 2 am.   Previous appt: States she had A1c was done 6/23. Reports previous A1c was 6.7. Starting to have water bottle (40 oz) on her daily to help stay hydrated. Reports she was told to aim for FBS numbers <110. OBGYN told her to check BS at 2am. States she has been trying to avoid carbohydrates due to diabetes.  States she overanalyzes each meal. Reports family history of type 2 diabetes.   Lives with husband and 2 children.    Eating history: Length of time: 6 years ago Previous treatments: none reported Goals for RD meetings: normalize relationship with food  Weight history:  Highest weight:    Lowest weight: 230 Most consistent weight:   What would you like to weigh: How has weight changed in the past year:   Medical Information:  Changes in hair, skin, nails since ED started: not reported Chewing/swallowing difficulties not reported Reflux or heartburn not reported Trouble with teeth not reported LMP without the use of hormones: not reported  Weight at that point: not  reported Effect of exercise on menses: not reported   Effect of hormones on menses: not reported Constipation, diarrhea: not reported Dizziness/lightheadedness: not reported Headaches/body aches: not reported Heart racing/chest pain: not reported Mood: not reported Sleep: not reported Focus/concentration: not reported Cold intolerance: not reported Vision changes: not reported  Mental health diagnosis:    Dietary assessment: A typical day consists of 3 meals and 1-2 snacks  Safe foods include:  Avoided foods include:  24 hour recall:  B (10 am): bagel  S: snack break - nuts, cheese, dried fruit L (2 pm): wrap + deli meat S (4 pm): watermelon D (7 pm): corn dogs + fruit + mini mac and cheese (plant-based) S (11 pm): binge-eat  Beverages: water (2.5*40 oz; 100 oz),   What Methods Do You Use To Control Your Weight (Compensatory behaviors)?           Restricting (calories, fat, carbs)  SIV  Diet pills  Laxatives  Diuretics  Alcohol or drugs  Exercise (what type)  Food rules or rituals (explain)  Binge  Estimated energy needs: 2800-2900 kcal 350-363 g CHO 210-218 g pro 62-64 g fat  Nutrition Diagnosis: NB-1.5 Disordered eating pattern As related to binge eating.  As evidenced by pt reports eating for 2-3 hours at a time.  Intervention/Goals: Mainly listened. Encouraged pt with increasing fluid intake. Discussed benefits of meeting with therapist to help with processing emotions/feelings.    Meal plan:    3 meals    2 snacks  Monitoring  and Evaluation: Patient will follow up in 2 weeks.

## 2019-08-15 ENCOUNTER — Ambulatory Visit: Payer: Medicaid Other | Admitting: *Deleted

## 2019-08-15 ENCOUNTER — Ambulatory Visit: Payer: Medicaid Other | Attending: Obstetrics and Gynecology

## 2019-08-15 ENCOUNTER — Other Ambulatory Visit: Payer: Self-pay

## 2019-08-15 VITALS — BP 136/78 | HR 95

## 2019-08-15 DIAGNOSIS — O34219 Maternal care for unspecified type scar from previous cesarean delivery: Secondary | ICD-10-CM

## 2019-08-15 DIAGNOSIS — O0990 Supervision of high risk pregnancy, unspecified, unspecified trimester: Secondary | ICD-10-CM | POA: Diagnosis present

## 2019-08-15 DIAGNOSIS — O10013 Pre-existing essential hypertension complicating pregnancy, third trimester: Secondary | ICD-10-CM

## 2019-08-15 DIAGNOSIS — Z362 Encounter for other antenatal screening follow-up: Secondary | ICD-10-CM

## 2019-08-15 DIAGNOSIS — O99213 Obesity complicating pregnancy, third trimester: Secondary | ICD-10-CM

## 2019-08-15 DIAGNOSIS — O24415 Gestational diabetes mellitus in pregnancy, controlled by oral hypoglycemic drugs: Secondary | ICD-10-CM

## 2019-08-15 DIAGNOSIS — Z3A32 32 weeks gestation of pregnancy: Secondary | ICD-10-CM

## 2019-08-15 DIAGNOSIS — E669 Obesity, unspecified: Secondary | ICD-10-CM

## 2019-08-15 NOTE — Progress Notes (Signed)
Pt reports having a scant amount of bleeding once on Saturday and again on Sunday, no bleeding since. Bleeding unrelated to intercourse. Good fetal movement, denies pain or contractions. OB appt is later today.

## 2019-08-19 ENCOUNTER — Other Ambulatory Visit: Payer: Self-pay

## 2019-08-19 ENCOUNTER — Inpatient Hospital Stay (HOSPITAL_COMMUNITY)
Admission: AD | Admit: 2019-08-19 | Discharge: 2019-08-23 | DRG: 833 | Disposition: A | Discharge status: Home / Self Care | Payer: Medicaid Other | Attending: Obstetrics and Gynecology | Admitting: Obstetrics and Gynecology

## 2019-08-19 ENCOUNTER — Encounter (HOSPITAL_COMMUNITY): Payer: Self-pay | Admitting: Obstetrics

## 2019-08-19 DIAGNOSIS — O34219 Maternal care for unspecified type scar from previous cesarean delivery: Secondary | ICD-10-CM | POA: Diagnosis present

## 2019-08-19 DIAGNOSIS — O10013 Pre-existing essential hypertension complicating pregnancy, third trimester: Secondary | ICD-10-CM | POA: Diagnosis present

## 2019-08-19 DIAGNOSIS — O24419 Gestational diabetes mellitus in pregnancy, unspecified control: Secondary | ICD-10-CM

## 2019-08-19 DIAGNOSIS — Z3689 Encounter for other specified antenatal screening: Secondary | ICD-10-CM

## 2019-08-19 DIAGNOSIS — O24415 Gestational diabetes mellitus in pregnancy, controlled by oral hypoglycemic drugs: Secondary | ICD-10-CM | POA: Diagnosis present

## 2019-08-19 DIAGNOSIS — Z7982 Long term (current) use of aspirin: Secondary | ICD-10-CM

## 2019-08-19 DIAGNOSIS — I10 Essential (primary) hypertension: Secondary | ICD-10-CM | POA: Diagnosis not present

## 2019-08-19 DIAGNOSIS — O10913 Unspecified pre-existing hypertension complicating pregnancy, third trimester: Secondary | ICD-10-CM

## 2019-08-19 DIAGNOSIS — Z3A33 33 weeks gestation of pregnancy: Secondary | ICD-10-CM

## 2019-08-19 DIAGNOSIS — O119 Pre-existing hypertension with pre-eclampsia, unspecified trimester: Secondary | ICD-10-CM

## 2019-08-19 DIAGNOSIS — O113 Pre-existing hypertension with pre-eclampsia, third trimester: Principal | ICD-10-CM | POA: Diagnosis present

## 2019-08-19 DIAGNOSIS — Z20822 Contact with and (suspected) exposure to covid-19: Secondary | ICD-10-CM | POA: Diagnosis present

## 2019-08-19 DIAGNOSIS — O99213 Obesity complicating pregnancy, third trimester: Secondary | ICD-10-CM | POA: Diagnosis present

## 2019-08-19 DIAGNOSIS — O10919 Unspecified pre-existing hypertension complicating pregnancy, unspecified trimester: Secondary | ICD-10-CM | POA: Diagnosis present

## 2019-08-19 DIAGNOSIS — Z7984 Long term (current) use of oral hypoglycemic drugs: Secondary | ICD-10-CM | POA: Diagnosis not present

## 2019-08-19 DIAGNOSIS — O10912 Unspecified pre-existing hypertension complicating pregnancy, second trimester: Secondary | ICD-10-CM | POA: Diagnosis not present

## 2019-08-19 LAB — URINALYSIS, ROUTINE W REFLEX MICROSCOPIC
Bilirubin Urine: NEGATIVE
Glucose, UA: NEGATIVE mg/dL
Hgb urine dipstick: NEGATIVE
Ketones, ur: 80 mg/dL — AB
Nitrite: NEGATIVE
Protein, ur: 30 mg/dL — AB
Specific Gravity, Urine: 1.021 (ref 1.005–1.030)
pH: 5 (ref 5.0–8.0)

## 2019-08-19 LAB — COMPREHENSIVE METABOLIC PANEL
ALT: 10 U/L (ref 0–44)
AST: 18 U/L (ref 15–41)
Albumin: 2.8 g/dL — ABNORMAL LOW (ref 3.5–5.0)
Alkaline Phosphatase: 84 U/L (ref 38–126)
Anion gap: 13 (ref 5–15)
BUN: 5 mg/dL — ABNORMAL LOW (ref 6–20)
CO2: 17 mmol/L — ABNORMAL LOW (ref 22–32)
Calcium: 9.2 mg/dL (ref 8.9–10.3)
Chloride: 105 mmol/L (ref 98–111)
Creatinine, Ser: 0.59 mg/dL (ref 0.44–1.00)
GFR calc Af Amer: >60 mL/min (ref >60)
GFR calc non Af Amer: >60 mL/min (ref >60)
Glucose, Bld: 129 mg/dL — ABNORMAL HIGH (ref 70–99)
Potassium: 3.7 mmol/L (ref 3.5–5.1)
Sodium: 135 mmol/L (ref 135–145)
Total Bilirubin: 0.3 mg/dL (ref 0.3–1.2)
Total Protein: 6.6 g/dL (ref 6.5–8.1)

## 2019-08-19 LAB — CBC
HCT: 29.6 % — ABNORMAL LOW (ref 36.0–46.0)
Hemoglobin: 9.4 g/dL — ABNORMAL LOW (ref 12.0–15.0)
MCH: 25.3 pg — ABNORMAL LOW (ref 26.0–34.0)
MCHC: 31.8 g/dL (ref 30.0–36.0)
MCV: 79.6 fL — ABNORMAL LOW (ref 80.0–100.0)
Platelets: 262 10*3/uL (ref 150–400)
RBC: 3.72 MIL/uL — ABNORMAL LOW (ref 3.87–5.11)
RDW: 13.9 % (ref 11.5–15.5)
WBC: 9.8 10*3/uL (ref 4.0–10.5)
nRBC: 0 % (ref 0.0–0.2)

## 2019-08-19 LAB — PROTEIN / CREATININE RATIO, URINE
Creatinine, Urine: 167.34 mg/dL
Protein Creatinine Ratio: 0.33 mg/mg{Cre} — ABNORMAL HIGH (ref 0.00–0.15)
Total Protein, Urine: 55 mg/dL

## 2019-08-19 LAB — TYPE AND SCREEN
ABO/RH(D): A POS
Antibody Screen: NEGATIVE

## 2019-08-19 LAB — GLUCOSE, CAPILLARY: Glucose-Capillary: 119 mg/dL — ABNORMAL HIGH (ref 70–99)

## 2019-08-19 LAB — SARS CORONAVIRUS 2 BY RT PCR (HOSPITAL ORDER, PERFORMED IN ~~LOC~~ HOSPITAL LAB): SARS Coronavirus 2: NEGATIVE

## 2019-08-19 MED ORDER — LABETALOL HCL 5 MG/ML IV SOLN
20.0000 mg | INTRAVENOUS | Status: DC | PRN
  Administered 2019-08-19: 20 mg via INTRAVENOUS
  Filled 2019-08-19: qty 4

## 2019-08-19 MED ORDER — METFORMIN HCL 500 MG PO TABS
1000.0000 mg | ORAL_TABLET | Freq: Every day | ORAL | Status: DC
  Administered 2019-08-19 – 2019-08-22 (×4): 1000 mg via ORAL
  Filled 2019-08-19 (×4): qty 2

## 2019-08-19 MED ORDER — ACETAMINOPHEN 325 MG PO TABS
650.0000 mg | ORAL_TABLET | ORAL | Status: DC | PRN
  Administered 2019-08-22: 650 mg via ORAL
  Filled 2019-08-19: qty 2

## 2019-08-19 MED ORDER — ASPIRIN EC 81 MG PO TBEC
81.0000 mg | DELAYED_RELEASE_TABLET | Freq: Every day | ORAL | Status: DC
  Administered 2019-08-20 – 2019-08-23 (×4): 81 mg via ORAL
  Filled 2019-08-19 (×4): qty 1

## 2019-08-19 MED ORDER — LABETALOL HCL 200 MG PO TABS
600.0000 mg | ORAL_TABLET | Freq: Two times a day (BID) | ORAL | Status: DC
  Administered 2019-08-19 – 2019-08-23 (×8): 600 mg via ORAL
  Filled 2019-08-19 (×6): qty 3
  Filled 2019-08-19: qty 6
  Filled 2019-08-19 (×2): qty 3

## 2019-08-19 MED ORDER — CALCIUM CARBONATE ANTACID 500 MG PO CHEW
2.0000 | CHEWABLE_TABLET | ORAL | Status: DC | PRN
  Administered 2019-08-19 – 2019-08-20 (×2): 400 mg via ORAL
  Filled 2019-08-19 (×2): qty 2

## 2019-08-19 MED ORDER — PRENATAL MULTIVITAMIN CH
1.0000 | ORAL_TABLET | Freq: Every day | ORAL | Status: DC
  Administered 2019-08-20 – 2019-08-22 (×3): 1 via ORAL
  Filled 2019-08-19 (×3): qty 1

## 2019-08-19 MED ORDER — DOCUSATE SODIUM 100 MG PO CAPS
100.0000 mg | ORAL_CAPSULE | Freq: Every day | ORAL | Status: DC
  Administered 2019-08-21 – 2019-08-23 (×3): 100 mg via ORAL
  Filled 2019-08-19 (×3): qty 1

## 2019-08-19 MED ORDER — HYDRALAZINE HCL 20 MG/ML IJ SOLN: 10.0000 mg | INTRAMUSCULAR | Status: DC | PRN

## 2019-08-19 MED ORDER — LABETALOL HCL 5 MG/ML IV SOLN
40.0000 mg | INTRAVENOUS | Status: DC | PRN
  Administered 2019-08-19: 40 mg via INTRAVENOUS
  Filled 2019-08-19: qty 8

## 2019-08-19 MED ORDER — METFORMIN HCL 500 MG PO TABS
500.0000 mg | ORAL_TABLET | Freq: Every day | ORAL | Status: DC
  Administered 2019-08-20 – 2019-08-22 (×3): 500 mg via ORAL
  Filled 2019-08-19 (×3): qty 1

## 2019-08-19 MED ORDER — LABETALOL HCL 5 MG/ML IV SOLN
80.0000 mg | INTRAVENOUS | Status: DC | PRN
  Administered 2019-08-19: 80 mg via INTRAVENOUS
  Filled 2019-08-19: qty 16

## 2019-08-19 MED ORDER — NIFEDIPINE ER OSMOTIC RELEASE 30 MG PO TB24
90.0000 mg | ORAL_TABLET | Freq: Every day | ORAL | Status: DC
  Administered 2019-08-20 – 2019-08-23 (×4): 90 mg via ORAL
  Filled 2019-08-19 (×4): qty 3

## 2019-08-19 NOTE — Plan of Care (Signed)
  Problem: Education: Goal: Knowledge of disease or condition will improve Outcome: Progressing Goal: Knowledge of the prescribed therapeutic regimen will improve Outcome: Progressing Goal: Individualized Educational Video(s) Outcome: Progressing   Problem: Clinical Measurements: Goal: Complications related to the disease process, condition or treatment will be avoided or minimized Outcome: Progressing   

## 2019-08-19 NOTE — MAU Note (Signed)
Pt sent from MD office for evaluation of HBP. Was 160/100 and 160/110 at office per patient. Pt has cHTN, on Procardia. Dose was increased to 90mg  today. Also on Labetalol. Took both medications today. She denies HA, vision changes, or RUQ pain. She denies contractions, vaginal bleeding, or LOF. Reports good fetal movement.

## 2019-08-19 NOTE — MAU Provider Note (Signed)
History     CSN: [id number]  Arrival date and time: 08/19/19 1843   First Provider Initiated Contact with Patient 08/19/19 2015      Chief Complaint  Patient presents with   Hypertension   [name] is a 29 y.o. [id number] at [redacted]w[redacted]d who presents to MAU for preeclampsia evaluation after she had elevated pressures at her OB's office today with two severe range pressures in the office.  Pt denies HA, blurry vision/seeing spots, N/V, epigastric pain, swelling in face and hands, sudden weight gain. Pt denies chest pain and SOB.  Pt denies constipation, diarrhea, or urinary problems. Pt denies fever, chills, fatigue, sweating or changes in appetite. Pt denies dizziness, light-headedness, weakness.  Pt denies VB, ctx, LOF and reports good FM.  Current pregnancy problems? Hx of preeclampsia, ?cHTN, previous C/S x2, GDM - metformin Blood Type? A Positive Allergies? NKDA Current medications? Labetalol 400mg  BID, Procardia 60mg  (changed to 90mg  today, but pt took 60 this AM) Current [facility] & next appt? [facility] , next appt 08/28/2019   OB History    Gravida  6   Para  2   Term  1   Preterm  1   AB  3   Living  2     SAB      TAB  3   Ectopic      Multiple  0   Live Births  2           Past Medical History:  Diagnosis Date   Anemia    Anxiety    Asthma    childhood   Depression    see social work consult   Gestational diabetes    Hx of varicella    Obese    Pregnancy induced hypertension    Vaginal Pap smear, abnormal     Past Surgical History:  Procedure Laterality Date   CESAREAN SECTION N/A 03/09/2012   Procedure: CESAREAN SECTION;  Surgeon: [place]. 10/28/2019, MD;  Location: [facility];  Service: Obstetrics;  Laterality: N/A;   CESAREAN SECTION N/A 12/03/2014   Procedure: CESAREAN SECTION;  Surgeon: Freddrick March, MD;  Location: [facility];  Service: Obstetrics;  Laterality: N/A;   COLPOSCOPY     HERNIA REPAIR     TONSILLECTOMY      UMBILICAL HERNIA REPAIR N/A 06/21/2014   Procedure: EXPLORATORY LAPAROTOMY,  SMALL BOWEL RESECTION, PRIMARY REPAIR INCARCERATED VENTRAL HERNIA;  Surgeon: 12/05/2014, MD;  Location: [facility];  Service: General;  Laterality: N/A;    Family History  Problem Relation Age of Onset   Anemia Mother    Anemia Sister    Birth defects Brother        heart disorder?   Hypertension Maternal Grandmother    Diabetes Maternal Grandmother    Hypertension Maternal Grandfather    Diabetes Maternal Grandfather    Hypertension Paternal Grandmother    Hypertension Paternal Grandfather    Diabetes Father     Social History   Tobacco Use   Smoking status: Never Smoker   Smokeless tobacco: Never Used  Substance Use Topics   Alcohol use: No   Drug use: No    Allergies: No Known Allergies  Medications Prior to Admission  Medication Sig Dispense Refill Last Dose   aspirin 325 MG tablet Take 325 mg by mouth daily.   08/19/2019 at Unknown time   cyclobenzaprine (FLEXERIL) 10 MG tablet Take 1 tablet (10 mg total) by mouth 3 (three) times daily as needed for  muscle spasms. 30 tablet 0 Past Week at Unknown time   IRON PO Take 325 mg by mouth 3 (three) times daily.    08/19/2019 at Unknown time   labetalol (NORMODYNE) 200 MG tablet Take 200 mg by mouth 2 (two) times daily.   08/19/2019 at Unknown time   metFORMIN (GLUCOPHAGE) 500 MG tablet Take by mouth 2 (two) times daily with a meal.   08/19/2019 at Unknown time   NIFEdipine (PROCARDIA PO) Take 60 mg by mouth daily.    08/19/2019 at Unknown time   Prenatal Vit-Fe Fumarate-FA (PRENATAL VITAMIN PO) Take by mouth.   08/18/2019 at Unknown time    Review of Systems  Constitutional: Negative for chills, diaphoresis, fatigue and fever.  Eyes: Negative for visual disturbance.  Respiratory: Negative for shortness of breath.   Cardiovascular: Negative for chest pain.  Gastrointestinal: Negative for abdominal pain, constipation, diarrhea, nausea and  vomiting.  Genitourinary: Negative for dysuria, flank pain, frequency, pelvic pain, urgency, vaginal bleeding and vaginal discharge.  Neurological: Negative for dizziness, weakness, light-headedness and headaches.   Physical Exam   Blood pressure (!) 153/82, pulse 94, temperature 98.4 F (36.9 C), temperature source Oral, resp. rate 20, height 5' 1.5" (1.562 m), weight (!) 136.9 kg, last menstrual period 12/29/2018, SpO2 98 %.  Patient Vitals for the past 24 hrs:  BP Temp Temp src Pulse Resp SpO2 Height Weight  08/19/19 2146 (!) 153/82 -- -- 94 -- 98 % -- --  08/19/19 2131 (!) 147/85 -- -- 96 -- 99 % -- --  08/19/19 2115 (!) 162/105 -- -- 100 -- 99 % -- --  08/19/19 2100 (!) 151/95 -- -- (!) 102 -- -- -- --  08/19/19 2046 (!) 152/94 -- -- 94 -- -- -- --  08/19/19 2033 (!) 163/100 -- -- (!) 105 -- -- -- --  08/19/19 2016 (!) 165/102 -- -- (!) 114 -- -- -- --  08/19/19 2001 (!) 164/100 -- -- (!) 110 -- -- -- --  08/19/19 1953 (!) 159/95 -- -- (!) 106 -- -- -- --  08/19/19 1921 (!) 136/94 98.4 F (36.9 C) Oral (!) 110 20 100 % 5' 1.5" (1.562 m) (!) 136.9 kg   Physical Exam Vitals and nursing note reviewed.  Constitutional:      General: She is not in acute distress.    Appearance: Normal appearance. She is not ill-appearing, toxic-appearing or diaphoretic.  HENT:     Head: Normocephalic and atraumatic.  Pulmonary:     Effort: Pulmonary effort is normal.  Neurological:     Mental Status: She is alert and oriented to person, place, and time.  Psychiatric:        Mood and Affect: Mood normal.        Behavior: Behavior normal.        Thought Content: Thought content normal.        Judgment: Judgment normal.    Results for orders placed or performed during the hospital encounter of 08/19/19 (from the past 24 hour(s))  Urinalysis, Routine w reflex microscopic     Status: Abnormal   Collection Time: 08/19/19  7:50 PM  Result Value Ref Range   Color, Urine YELLOW YELLOW    APPearance HAZY (A) CLEAR   Specific Gravity, Urine 1.021 1.005 - 1.030   pH 5.0 5.0 - 8.0   Glucose, UA NEGATIVE NEGATIVE mg/dL   Hgb urine dipstick NEGATIVE NEGATIVE   Bilirubin Urine NEGATIVE NEGATIVE   Ketones, ur 80 (A) NEGATIVE mg/dL  Protein, ur 30 (A) NEGATIVE mg/dL   Nitrite NEGATIVE NEGATIVE   Leukocytes,Ua TRACE (A) NEGATIVE   RBC / HPF 0-5 0 - 5 RBC/hpf   WBC, UA 6-10 0 - 5 WBC/hpf   Bacteria, UA FEW (A) NONE SEEN   Squamous Epithelial / LPF 0-5 0 - 5   Mucus PRESENT   Protein / creatinine ratio, urine     Status: Abnormal   Collection Time: 08/19/19  7:50 PM  Result Value Ref Range   Creatinine, Urine 167.34 mg/dL   Total Protein, Urine 55 mg/dL   Protein Creatinine Ratio 0.33 (H) 0.00 - 0.15 mg/mg[Cre]  CBC     Status: Abnormal   Collection Time: 08/19/19  8:10 PM  Result Value Ref Range   WBC 9.8 4.0 - 10.5 K/uL   RBC 3.72 (L) 3.87 - 5.11 MIL/uL   Hemoglobin 9.4 (L) 12.0 - 15.0 g/dL   HCT 11.929.6 (L) 36 - 46 %   MCV 79.6 (L) 80.0 - 100.0 fL   MCH 25.3 (L) 26.0 - 34.0 pg   MCHC 31.8 30.0 - 36.0 g/dL   RDW 14.713.9 82.911.5 - 56.215.5 %   Platelets 262 150 - 400 K/uL   nRBC 0.0 0.0 - 0.2 %  Comprehensive metabolic panel     Status: Abnormal   Collection Time: 08/19/19  8:10 PM  Result Value Ref Range   Sodium 135 135 - 145 mmol/L   Potassium 3.7 3.5 - 5.1 mmol/L   Chloride 105 98 - 111 mmol/L   CO2 17 (L) 22 - 32 mmol/L   Glucose, Bld 129 (H) 70 - 99 mg/dL   BUN 5 (L) 6 - 20 mg/dL   Creatinine, Ser 1.300.59 0.44 - 1.00 mg/dL   Calcium 9.2 8.9 - 86.510.3 mg/dL   Total Protein 6.6 6.5 - 8.1 g/dL   Albumin 2.8 (L) 3.5 - 5.0 g/dL   AST 18 15 - 41 U/L   ALT 10 0 - 44 U/L   Alkaline Phosphatase 84 38 - 126 U/L   Total Bilirubin 0.3 0.3 - 1.2 mg/dL   GFR calc non Af Amer >60 >60 mL/min   GFR calc Af Amer >60 >60 mL/min   Anion gap 13 5 - 15    MAU Course  Procedures  MDM -preeclampsia evaluation with severe range BP in MAU on admission -labetalol protocol ordered, dose  x2 -symptoms include: none -UA: hazy/80ketones/30PRO/trace leuks/few bacteria, sending urine for culture -CBC: H/H 9.4/29.6, platelets 262 -CMP: serum creatinine 0.59, AST/ALT 18/10 -PCr: 0.33 -EFM: reactive       -baseline: 135       -variability: moderate       -accels: present, 15x15       -decels: ?decel       -TOCO: quiet -consulted with Dr. Debroah LoopArnold, recommends admission for patient -spoke with Dr. Chestine Sporelark who agrees with plan for admission and will enter admission orders -ad[facility] to [facility]  Orders Placed This Encounter  Procedures   Culture, OB Urine    Standing Status:   Standing    Number of Occurrences:   1   Urinalysis, Routine w reflex microscopic    Standing Status:   Standing    Number of Occurrences:   1   CBC    Standing Status:   Standing    Number of Occurrences:   1   Comprehensive metabolic panel    Standing Status:   Standing    Number of Occurrences:   1  Protein / creatinine ratio, urine    Standing Status:   Standing    Number of Occurrences:   1   Notify Physician    Confirmatory reading of BP> 160/110 15 minutes later    Standing Status:   Standing    Number of Occurrences:   1    Order Specific Question:   Notify Physician    Answer:   Temp greater than or equal to 100.4    Order Specific Question:   Notify Physician    Answer:   RR greater than 24 or less than 10    Order Specific Question:   Notify Physician    Answer:   HR greater than 120 or less than 50    Order Specific Question:   Notify Physician    Answer:   SBP greater than 160 mmHG or less than 80 mmHG    Order Specific Question:   Notify Physician    Answer:   DBP greater than 110 mmHG or less than 45 mmHG    Order Specific Question:   Notify Physician    Answer:   Urinary output is less than for any 4 hour period   Measure blood pressure    20 minutes after giving hydralazine 10 MG IV dose.  Call MD if SBP >/= 160 or DBP >/= 110.    Standing Status:    Standing    Number of Occurrences:   1   Meds ordered this encounter  Medications   AND Linked Order Group    labetalol (NORMODYNE) injection 20 mg    labetalol (NORMODYNE) injection 40 mg    labetalol (NORMODYNE) injection 80 mg    hydrALAZINE (APRESOLINE) injection 10 mg    Assessment and Plan   1. Chronic hypertension with superimposed preeclampsia   2. [redacted] weeks gestation of pregnancy   3. NST (non-stress test) reactive    -admit to [facility]  Odie Sera Elica Almas 08/19/2019, 9:59 PM

## 2019-08-19 NOTE — H&P (Signed)
29 y.o. [id number] @ [redacted]w[redacted]d presents to [facility]U for blood pressure evaluation. She has a history of CHTN on no meds prior to pregnancy.  BPs prior to pregnancy were 140s/100s.  Blood pressure was labile in early pregnancy, 120/80 to 170/110.  She was started on procardia XL 30mg  daily at 18 weeks.  Her current regimen of procardia XL 60mg  daily and labetalol 400mg  BID has yielded suboptimal control over the last 4 weeks.  She reports BPs at home in the 120s/80s, but in the office has consistently been 150s/90s.  Today, BP was 165/105 x2.  She reports feeling well.  Does admit BP has been increasing at home over the last few days, but not as high as at the office today.  She denies headache, visual changes, epigastric or right upper quadrant pain.  In MAU, she received IV labetalol 20 and 40 mg to lower her BP  Pregnancy c/b: 1. Chronic hypertension as above.  On aspirin 81mg .  History of superimpsed preeclampsia with G2, delivered at 36 weeks.  Baseline up:c 0.24. She has been followed by MFM this pregnancy.  2. Diabetes--likely undiagnosed pregestational diabetes.  Early A1c was 6.9 and early 1 hour failed at 227.  On metformin 500qAM and 1000qhs.  A1c has improved to 5.9, but fasting blood sugars are still suboptimally controlled.  She is unable to tolerate metformin 1500mg  qhs and has declined recommendation for insulin therapy.   3. Morbid obesity: prepregnancy BMI 59.9 4. History of cesarean section: 5. Anxiety: not on any medication at this time 6.  Disordered eating: seeing dietician / therapist  Past Medical History:  Diagnosis Date  . Anemia   . Anxiety   . Asthma    childhood  . Depression    see social work consult  . Gestational diabetes   . Hx of varicella   . Obese   . Pregnancy induced hypertension   . Vaginal Pap smear, abnormal     Past Surgical History:  Procedure Laterality Date  . CESAREAN SECTION N/A 03/09/2012   Procedure: CESAREAN SECTION;  Surgeon: . , MD;   Location: [facility];  Service: Obstetrics;  Laterality: N/A;  . CESAREAN SECTION N/A 12/03/2014   Procedure: CESAREAN SECTION;  Surgeon: , MD;  Location: [facility];  Service: Obstetrics;  Laterality: N/A;  . COLPOSCOPY    . HERNIA REPAIR    . TONSILLECTOMY    . UMBILICAL HERNIA REPAIR N/A 06/21/2014   Procedure: EXPLORATORY LAPAROTOMY,  SMALL BOWEL RESECTION, PRIMARY REPAIR INCARCERATED VENTRAL HERNIA;  Surgeon: Freddrick March, MD;  Location: [facility];  Service: General;  Laterality: N/A;    OB History  Gravida Para Term Preterm AB Living  6 2 1 1 3 2   SAB TAB Ectopic Multiple Live Births    3   0 2    # Outcome Date GA Lbr Len/2nd Weight Sex Delivery Anes PTL Lv  6 Current           5 Preterm 12/03/14 [redacted]w[redacted]d  3620 g F CS-LTranv EPI, Spinal  LIV     Birth Comments: Preeclampsia, mother treated with mag after birth  4 Term 03/09/12 [redacted]w[redacted]d   M CS-LTranv EPI  LIV  3 TAB 2013          2 TAB 2012          1 TAB 2011            Social History   Socioeconomic History  . Marital status: Married  Spouse name: Not on file  . Number of children: Not on file  . Years of education: Not on file  . Highest education level: Not on file  Occupational History  . Not on file  Tobacco Use  . Smoking status: Never Smoker  . Smokeless tobacco: Never Used  Substance and Sexual Activity  . Alcohol use: No  . Drug use: No  . Sexual activity: Yes  Other Topics Concern  . Not on file  Social History Narrative  . Not on file   Social Determinants of Health   Financial Resource Strain:   . Difficulty of Paying Living Expenses:     Patient has no known allergies.    Prenatal Transfer Tool  Maternal Diabetes: Yes:  Diabetes Type:  Pre-pregnancy--see above Genetic Screening: Declined Maternal Ultrasounds/Referrals: Normal Fetal Ultrasounds or other Referrals:  Referred to Materal Fetal Medicine  Maternal Substance Abuse:  No Significant Maternal Medications:  Meds include: Other:   procardia XL, labetalol, aspirin Significant Maternal Lab Results: None   Vitals:   08/19/19 2146 08/19/19 2201  BP: (!) 153/82 (!) 156/96  Pulse: 94 100  Resp:    Temp:    SpO2: 98%      General:  NAD HEENT:  No periorbital edema Abdomen:  soft, obese, gravid Ex:  No edema, DTRs 2+, no clonus SVE:  deferred FHTs:  130s, moderate variability, + accelerations, category 1 Toco:  quiet  7/22--growth [place] 1966 gm (62%, AC 89%)  A/P   29 y.o. [id number] 107w2d presents with worsening hypertension in the third trimester 1. Hypertension--underlying chronic hypertension.  Started on anti-hypertensive therapy this pregnancy and titrated up.  I feel that her current hypertension is related to worsening chronic hypertension and is less likely superimposed preeclampsia.  Her labs are normal, with the exception of up:c of 0.33 (baseline 0.24).  She is asymptomatic without signs/symptoms of severe preeclampsia.  Will not start magnesium suflate at this time.  Will increase to labetalol 600mg  BID, first dose now and procardia XL 90mg  when due in AM.  Will plan MFM consult in AM re: hypertension and delivery timing.     2. GDMA2---on metformin 500mg  qAM / 1000mg  qhs with suboptimal fasting control.  She has declined recommendations multiple times to start insulin. Fastings/2hr PP 3. Prematurity--we discussed the role of betamethasone for fetal lung maturity < 34 weeks.  As I feel that this is an acute exacerbation of her chronic hypertension and delivery in the next week is unlikely, I will hold betamethasone at this time.  In the setting of diabetes, would expect hyperglycemia, and she has been unwilling to initiate insulin during this pregnancy.  I feel that the risk of betamethasone series at this time outweighs the benefits.  If BPs worsen and delivery appears more imminent, will reconsider.  4. PPx: SCDs  Syleena Mchan GEFFEL Shelby Peltz

## 2019-08-20 ENCOUNTER — Inpatient Hospital Stay: Payer: Medicaid Other

## 2019-08-20 DIAGNOSIS — O10919 Unspecified pre-existing hypertension complicating pregnancy, unspecified trimester: Secondary | ICD-10-CM | POA: Diagnosis present

## 2019-08-20 DIAGNOSIS — O24415 Gestational diabetes mellitus in pregnancy, controlled by oral hypoglycemic drugs: Secondary | ICD-10-CM

## 2019-08-20 DIAGNOSIS — I10 Essential (primary) hypertension: Secondary | ICD-10-CM

## 2019-08-20 DIAGNOSIS — O10912 Unspecified pre-existing hypertension complicating pregnancy, second trimester: Secondary | ICD-10-CM

## 2019-08-20 DIAGNOSIS — Z7984 Long term (current) use of oral hypoglycemic drugs: Secondary | ICD-10-CM

## 2019-08-20 DIAGNOSIS — Z3A33 33 weeks gestation of pregnancy: Secondary | ICD-10-CM

## 2019-08-20 LAB — GLUCOSE, CAPILLARY
Glucose-Capillary: 115 mg/dL — ABNORMAL HIGH (ref 70–99)
Glucose-Capillary: 115 mg/dL — ABNORMAL HIGH (ref 70–99)
Glucose-Capillary: 121 mg/dL — ABNORMAL HIGH (ref 70–99)
Glucose-Capillary: 94 mg/dL (ref 70–99)
Glucose-Capillary: 152 mg/dL — ABNORMAL HIGH (ref 70–99)

## 2019-08-20 MED ORDER — FAMOTIDINE 20 MG PO TABS
20.0000 mg | ORAL_TABLET | Freq: Two times a day (BID) | ORAL | Status: DC
  Administered 2019-08-20 – 2019-08-23 (×7): 20 mg via ORAL
  Filled 2019-08-20 (×7): qty 1

## 2019-08-20 MED ORDER — BETAMETHASONE SOD PHOS & ACET 6 (3-3) MG/ML IJ SUSP
12.0000 mg | INTRAMUSCULAR | Status: AC
  Administered 2019-08-20 – 2019-08-21 (×2): 12 mg via INTRAMUSCULAR
  Filled 2019-08-20: qty 5

## 2019-08-20 NOTE — Progress Notes (Signed)
Pt has taken herself off of the monitor and told this RN that she needs " " off the monitor because she is uncomfortable. Will place her back on when patient agreeable. Carmelina Dane, RN

## 2019-08-20 NOTE — Consult Note (Addendum)
MFM Note   Marilyn Cooper is a gravida 6 para 1-1-3-2 currently at 33 weeks and 3 days.  She was admitted last night due to elevated blood pressures.  The patient has a history of chronic hypertension and gestational diabetes.    On admission, the patient was noted to have blood pressures in the 150s to 160s over 90s to 100s range.  She did require 2 doses of IV labetalol at the time of admission to lower her blood pressures.  The patient denies any symptoms of preeclampsia.  Her PIH labs on admission were all within normal limits.  Her P/C ratio at the time of admission was 0.33.   The dosages of her antihypertensive medications were increased overnight to Procardia XL 90 mg daily and labetalol 600 mg twice a day.  Her blood pressures were in the 130s over 60s to 150s over 80s range since the adjustments were made to her antihypertensive medications.  The patient continues to deny any signs or symptoms of preeclampsia.  Her fetal status has been reassuring.    The patient had a growth ultrasound performed in our office on July 22 showing an estimated fetal weight of 4 pounds 5 ounces (62nd percentile for her gestational age).    She is currently treated with Metformin for her gestational diabetes.  Her fingerstick values overnight were within the normal range.  As it was felt that the patient most likely had an exacerbation of her chronic hypertension, a course of antenatal corticosteroids was not administered.  She has a history of 2 prior cesarean deliveries.  The patient and her husband were were advised that her P/C ratio was borderline for the diagnosis of preeclampsia.  Therefore, I would recommend that she have a 24-hour urine collected to assess for proteinuria..    The timing of her delivery will depend on her labs, her blood pressures, and her symptoms.   Should she be diagnosed with preeclampsia based on her 24-hour urine results or should her blood pressures remain elevated despite  medication treatment, I would recommend delivery at around 37 weeks.    An earlier delivery (at >34 weeks) may be indicated should her PIH labs show any abnormalities, should she develop any signs or symptoms of severe preeclampsia, or should she require continued increases in the dosages of her antihypertensive medications.    As the patient will most likely require an early term or late preterm delivery, I would recommend that she receive a course of antenatal corticosteroids.    Should the patient be discharged home following her current hospital admission, she should continue twice weekly fetal testing and blood pressure checks.  Her PIH labs should also be monitored weekly until delivery.    The patient and her husband stated that all of their questions had been answered to their complete satisfaction.  We will continue to follow her closely with you in our office each week.  Recommendations:  24-hour urine to assess for total protein  Administer a complete course of antenatal corticosteroids  Continue current antihypertensive medications  Continue Metformin for treatment of gestational diabetes  Delivery at 37 weeks should she be diagnosed with preeclampsia or should her blood pressures remain elevated despite antihypertensive treatment  Delivery at 34 weeks or greater should her PIH labs show any abnormalities, should she show any signs or symptoms of severe preeclampsia, or should she require increasing dosages of antihypertensive medications  Twice-weekly fetal testing with weekly PIH labs should she be discharged home.

## 2019-08-20 NOTE — Progress Notes (Signed)
Inpatient Diabetes Program Recommendations  Diabetes Treatment Program Recommendations  ADA Standards of Care 2018 Diabetes in Pregnancy Target Glucose Ranges:  Fasting: 60 - 90 mg/dL Preprandial: 60 - 956 mg/dL 1 hr postprandial: Less than 140mg /dL (from first bite of meal) 2 hr postprandial: Less than 120 mg/dL (from first bite of meal)     Lab Results  Component Value Date   GLUCAP 115 (H) 08/20/2019    Review of Glycemic Control Results for Marilyn Cooper, Marilyn Cooper (MRN [name]) as of 08/20/2019 09:56  Ref. Range 08/19/2019 23:00 08/20/2019 06:01  Glucose-Capillary Latest Ref Range: 70 - 99 mg/dL 10/20/2019 (H) 951 (H)   Diabetes history: GDM Outpatient Diabetes medications: Metformin 500 mg BID Current orders for Inpatient glycemic control: Metformin 500 mg QAM/1000 mg QPM  Inpatient Diabetes Program Recommendations:    Noted consult and MD for patient refusal of insulin recommendations in pregnancy. Will watch trends today. BMZ being held at this time.   Following.   Thanks, [phone], MSN, RNC-OB Diabetes Coordinator [phone] (8a-5p)

## 2019-08-21 LAB — CULTURE, OB URINE

## 2019-08-21 LAB — GLUCOSE, CAPILLARY
Glucose-Capillary: 100 mg/dL — ABNORMAL HIGH (ref 70–99)
Glucose-Capillary: 112 mg/dL — ABNORMAL HIGH (ref 70–99)
Glucose-Capillary: 132 mg/dL — ABNORMAL HIGH (ref 70–99)
Glucose-Capillary: 199 mg/dL — ABNORMAL HIGH (ref 70–99)

## 2019-08-21 LAB — PROTEIN, URINE, 24 HOUR
Collection Interval-UPROT: 24 hours
Protein, 24H Urine: 800 mg/d — ABNORMAL HIGH (ref 50–100)
Protein, Urine: 64 mg/dL
Urine Total Volume-UPROT: 1250 mL

## 2019-08-21 MED ORDER — ONDANSETRON HCL 4 MG PO TABS
8.0000 mg | ORAL_TABLET | Freq: Once | ORAL | Status: AC
  Administered 2019-08-21: 8 mg via ORAL
  Filled 2019-08-21: qty 2

## 2019-08-21 NOTE — Progress Notes (Signed)
Late entry Saw pt yesterday, feeling well, no HA/vision change or RUQ pain.  Good FM. FHT Cat 1 TOCO: quiet A/P: HD #3 33.4 with CHTN and possibly PEC At the time of visiting pt MFM consult pending.  Spoke with Dr. Parke Poisson who felt blood sugars well enough controlled on metformin.  BP regimen of Procardia XL 90 qd and 600mg  labetalol bid appropriate.  He made detailed recommendation in separate note. Overnight alerted to 1 severe range BP followed by mild range.  On review this morning see that pt had 2 severe ranges that were not treated and alerted oncoming on call doctor Betamethasone x 1, second does today 24hr urine pending. See MFM note for delivery recommendations and ANT recommendation.

## 2019-08-21 NOTE — Progress Notes (Signed)
29 y.o. [id number] [redacted]w[redacted]d HD#2 admitted for Benign essential hypertension antepartum in third trimester [O10.013] Chronic hypertension affecting pregnancy [O10.919].  Pt currently stable with no c/o pre-e sx.  Good FM.  Vitals:   08/20/19 2345 08/21/19 0003 08/21/19 0020 08/21/19 0329  BP: (!) 169/85 (!) 169/85 (!) 143/65 124/60  Pulse:   94 94  Resp:    18  Temp:    98.3 F (36.8 C)  TempSrc:    Oral  SpO2:    100%  Weight:      Height:        Lungs CTA Cor RRR Abd  Soft, gravid, nontender Ex SCDs FHTs  120s, good short term variability, NST R last night Toco  occ  Results for orders placed or performed during the hospital encounter of 08/19/19 (from the past 24 hour(s))  Glucose, capillary     Status: Abnormal   Collection Time: 08/20/19 10:29 AM  Result Value Ref Range   Glucose-Capillary 121 (H) 70 - 99 mg/dL  Glucose, capillary     Status: None   Collection Time: 08/20/19  3:24 PM  Result Value Ref Range   Glucose-Capillary 94 70 - 99 mg/dL  Glucose, capillary     Status: Abnormal   Collection Time: 08/20/19  8:15 PM  Result Value Ref Range   Glucose-Capillary 152 (H) 70 - 99 mg/dL  Glucose, capillary     Status: Abnormal   Collection Time: 08/21/19  6:17 AM  Result Value Ref Range   Glucose-Capillary 100 (H) 70 - 99 mg/dL    A:  HD#2  [redacted]w[redacted]d with:  1. Chronic hypertension with exacerbation and currently no evidence of superimposed severe preeclampsia.  BPs over night were in severe range but no additional meds given.  Currently on Procardia 90XL and Labetalol 600 mg BID.  MFM rec: Delivery at 37 weeks should she be diagnosed with preeclampsia or should her blood pressures remain elevated despite antihypertensive treatment  Delivery at 34 weeks or greater should her PIH labs show any abnormalities, should she show any signs or symptoms of severe preeclampsia, or should she require increasing dosages of antihypertensive medications  Twice-weekly fetal testing  with weekly PIH labs should she be discharged home.  Currently stable and doing 24 hour urine.    2.  Likely Type 2 DM, on metformin 500 mg am and 1000 mg pm.  MFM did not rec changing to insulin yet.  Pt received BMZ over last couple days and will hold any changes until a few days post since the BMZ is likely to increase sugars.   3.  [place] by MFM on 7-29 showed 4#5, breech, AFI normal and BPP 8/8.   P: 1.  Continue 24 hour urine for protein.  Continue to watch for severe symptoms.  BPs overnight were elevated but currently in good range. 2.  Continue diabetic diet and metformin. 3.  NST qd, BPP weekly and PIH labs weekly. 4.  Plan for d/c will depend on BPs, 24 hour urine and if pt develops any severe sx. No plan for d/c today.   Loney Laurence

## 2019-08-21 NOTE — Progress Notes (Signed)
Inpatient Diabetes Program Recommendations  Diabetes Treatment Program Recommendations  ADA Standards of Care 2018 Diabetes in Pregnancy Target Glucose Ranges:  Fasting: 60 - 90 mg/dL Preprandial: 60 - 546 mg/dL 1 hr postprandial: Less than 140mg /dL (from first bite of meal) 2 hr postprandial: Less than 120 mg/dL (from first bite of meal)    Lab Results  Component Value Date   GLUCAP 100 (H) 08/21/2019    Review of Glycemic Control Results for MARIEM, SKOLNICK (MRN [name]) as of 08/21/2019 09:23  Ref. Range 08/20/2019 10:29 08/20/2019 15:24 08/20/2019 20:15 08/21/2019 06:17  Glucose-Capillary Latest Ref Range: 70 - 99 mg/dL 10/21/2019 (H) 94 818 (H) 299 (H)   Diabetes history: GDM Outpatient Diabetes medications: Metformin 500 mg BID Current orders for Inpatient glycemic control: Metformin 500 mg QAM/1000 mg QPM BMZ x1 (of 2) Inpatient Diabetes Program Recommendations:    In setting of steroids and if patient complies, consider adding Novolog 0-14 units TID (under Diabetes Treatment in Pregnancy order set).   Thanks, 371, MSN, RNC-OB Diabetes Coordinator [phone] (8a-5p)

## 2019-08-21 NOTE — Progress Notes (Signed)
Vitals:   08/21/19 0329 08/21/19 0733 08/21/19 1103 08/21/19 1527  BP: 124/60 131/65 137/74 (!) 149/70  Pulse: 94 89 87 91  Resp: 18 20 20 20   Temp: 98.3 F (36.8 C) 97.7 F (36.5 C) 97.7 F (36.5 C) 97.8 F (36.6 C)  TempSrc: Oral Oral Oral Oral  SpO2: 100% 100% 98% 99%  Weight:      Height:

## 2019-08-22 LAB — CBC
HCT: 27.9 % — ABNORMAL LOW (ref 36.0–46.0)
Hemoglobin: 9 g/dL — ABNORMAL LOW (ref 12.0–15.0)
MCH: 26.2 pg (ref 26.0–34.0)
MCHC: 32.3 g/dL (ref 30.0–36.0)
MCV: 81.1 fL (ref 80.0–100.0)
Platelets: 248 10*3/uL (ref 150–400)
RBC: 3.44 MIL/uL — ABNORMAL LOW (ref 3.87–5.11)
RDW: 14.1 % (ref 11.5–15.5)
WBC: 10.6 10*3/uL — ABNORMAL HIGH (ref 4.0–10.5)
nRBC: 0 % (ref 0.0–0.2)

## 2019-08-22 LAB — COMPREHENSIVE METABOLIC PANEL
ALT: 12 U/L (ref 0–44)
AST: 19 U/L (ref 15–41)
Albumin: 2.8 g/dL — ABNORMAL LOW (ref 3.5–5.0)
Alkaline Phosphatase: 70 U/L (ref 38–126)
Anion gap: 13 (ref 5–15)
BUN: 7 mg/dL (ref 6–20)
CO2: 17 mmol/L — ABNORMAL LOW (ref 22–32)
Calcium: 9.3 mg/dL (ref 8.9–10.3)
Chloride: 106 mmol/L (ref 98–111)
Creatinine, Ser: 0.7 mg/dL (ref 0.44–1.00)
GFR calc Af Amer: >60 mL/min (ref >60)
GFR calc non Af Amer: >60 mL/min (ref >60)
Glucose, Bld: 106 mg/dL — ABNORMAL HIGH (ref 70–99)
Potassium: 3.7 mmol/L (ref 3.5–5.1)
Sodium: 136 mmol/L (ref 135–145)
Total Bilirubin: 0.2 mg/dL — ABNORMAL LOW (ref 0.3–1.2)
Total Protein: 6.5 g/dL (ref 6.5–8.1)

## 2019-08-22 LAB — GLUCOSE, CAPILLARY
Glucose-Capillary: 118 mg/dL — ABNORMAL HIGH (ref 70–99)
Glucose-Capillary: 105 mg/dL — ABNORMAL HIGH (ref 70–99)
Glucose-Capillary: 99 mg/dL (ref 70–99)
Glucose-Capillary: 129 mg/dL — ABNORMAL HIGH (ref 70–99)

## 2019-08-22 MED ORDER — BUTALBITAL-APAP-CAFFEINE 50-325-40 MG PO TABS
2.0000 | ORAL_TABLET | Freq: Once | ORAL | Status: AC
  Administered 2019-08-22: 2 via ORAL
  Filled 2019-08-22: qty 2

## 2019-08-22 MED ORDER — METFORMIN HCL 500 MG PO TABS
500.0000 mg | ORAL_TABLET | Freq: Once | ORAL | Status: AC
  Administered 2019-08-22: 500 mg via ORAL
  Filled 2019-08-22: qty 1

## 2019-08-22 MED ORDER — METFORMIN HCL 500 MG PO TABS
1000.0000 mg | ORAL_TABLET | Freq: Every day | ORAL | Status: DC
  Administered 2019-08-23: 1000 mg via ORAL
  Filled 2019-08-22: qty 2

## 2019-08-22 NOTE — Progress Notes (Signed)
Patient ID: Marilyn Cooper, female   DOB: 1990/07/15, 29 y.o.   MRN: [id number]   HD#4  29yo [id number] @33 +5 admitted with chronic hypertension and gestational diabetes  S: Patient noted that she woke up with a headache this morning.  She took Tylenol and the headache resolved.  However, while talking with the patient she stated that she felt like her headache was returning.  She denies any visual scotoma, severe right upper quadrant or epigastric pain.  Active fetal movements, no leaking or bleeding.  O:  Vitals:   08/21/19 1937 08/21/19 2239 08/22/19 0609 08/22/19 0749  BP: 126/62 140/71 135/61 125/74  Pulse: 98 86 81 80  Resp: 18 18 18 18   Temp: 98.3 F (36.8 C) 97.9 F (36.6 C) 97.8 F (36.6 C) 97.8 F (36.6 C)  TempSrc: Oral Oral Oral Oral  SpO2: 100% 100% 98% 98%  Weight:      Height:       AOx3, NAD Abd Soft FHR 130 NST reactive, cat 1 tracing  CBG (last 3)  Recent Labs    08/21/19 1618 08/21/19 2044 08/22/19 0657  GLUCAP 132* 199* 118*    Results for orders placed or performed during the hospital encounter of 08/19/19 (from the past 24 hour(s))  Glucose, capillary     Status: Abnormal   Collection Time: 08/21/19  4:18 PM  Result Value Ref Range   Glucose-Capillary 132 (H) 70 - 99 mg/dL   Comment 1 Notify RN   Glucose, capillary     Status: Abnormal   Collection Time: 08/21/19  8:44 PM  Result Value Ref Range   Glucose-Capillary 199 (H) 70 - 99 mg/dL  Glucose, capillary     Status: Abnormal   Collection Time: 08/22/19  6:57 AM  Result Value Ref Range   Glucose-Capillary 118 (H) 70 - 99 mg/dL   Lab Results  Component Value Date   PROTEIN24HR 800 (H) 08/20/2019   PROTEIN24HR 168 (H) 02/27/2012   A/P 29 yo 10/20/2019 @ 33+5 w/ cHTN, now superimposed pre-eclampsia by 24 hr urine protein, gestational diabetes 1) cHTN w/ superimposed pre-eclampsia: Patient is currently on Procardia 90 mg XL daily, labetalol 600 mg twice daily.  Blood pressures are now in the  normotensive range.  24-hour urine protein returned at 800 mg per 24 hours which meets criteria for preeclampsia.  Per MFM note, with mild preeclampsia the patient can still be discharged home if asymptomatic otherwise blood pressures well controlled.  We will plan for delivery at 37 weeks.  Given patient's current headache that is incompletely resolved with Tylenol and this is the first time she is experienced a headache will continue with inpatient management.  If headache resolves and does not return could consider discharge later today.  However, if headache persists and does not resolve even with well-controlled blood pressures this may constitute severe preeclampsia and would need to proceed with delivery at 34 weeks.  Continue aspirin 81 mg 2) gestational diabetes: Overall glucose control since receiving betamethasone has improved.  Patient is currently on Metformin 500 mg every morning and 1000 mg nightly.  Patient received her second dose of betamethasone yesterday.  Will add 500 mg of Metformin in the morning to increase her to 1000 mg in the morning and 1000 mg in the evening.  Discussed with the patient that she may see a elevation in her blood sugars for 7 days after receiving steroids.  We may need to add insulin coverage during this time. 3) patient is  status post betamethasone on 8/3 and 8/4 4) antenatal testing reassuring 5) continue SCDs while in bed 6) mode of delivery. patient is for repeat cesarean section.

## 2019-08-23 ENCOUNTER — Ambulatory Visit: Payer: Medicaid Other

## 2019-08-23 DIAGNOSIS — O24419 Gestational diabetes mellitus in pregnancy, unspecified control: Secondary | ICD-10-CM

## 2019-08-23 DIAGNOSIS — O119 Pre-existing hypertension with pre-eclampsia, unspecified trimester: Secondary | ICD-10-CM

## 2019-08-23 LAB — GLUCOSE, CAPILLARY
Glucose-Capillary: 98 mg/dL (ref 70–99)
Glucose-Capillary: 99 mg/dL (ref 70–99)

## 2019-08-23 MED ORDER — METFORMIN HCL 500 MG PO TABS: 1000.0000 mg | ORAL_TABLET | Freq: Two times a day (BID) | ORAL | 0 refills | Status: DC

## 2019-08-23 MED ORDER — LABETALOL HCL 300 MG PO TABS: 600.0000 mg | ORAL_TABLET | Freq: Two times a day (BID) | ORAL | 0 refills | Status: DC

## 2019-08-23 NOTE — Progress Notes (Signed)
Spoke to Dr. Claudine Mouton at 11:30am regarding update on pt's morning.  Latest BP 122/73, 2 hours postprandial breakfast CBG = 99, pt denies headache or other preE sx, Category 1 tracing on this morning's EFM.  Dr. Claudine Mouton to write discharge orders with pt to f/u in office on Mon (8/9) or Tues (8/10) next week.  Office staff working on scheduling cesarean at 37 weeks around Aug 28.  Pt discharged to home with husband.  Condition stable.  Discharge instructions reviewed with pt and husband together.  Pt ambulated to car with D. Gainey-Lloyd, NT.  No equipment for home ordered at discharge.

## 2019-08-23 NOTE — Progress Notes (Signed)
Marilyn Cooper 29 y.o. [id number] at [redacted]w[redacted]d HD#5 admitted with severe BP S: Headache resolved. Denies SOB/RUQ pain.  Denies contractions, LOF, VB. Normal FM O: Vitals:   08/22/19 1525 08/22/19 2018 08/22/19 2321 08/23/19 0624  BP: (!) 144/67 (!) 142/67 140/69 137/69  Pulse: 82 86 83 82  Resp: 18 18 18 18   Temp: 97.7 F (36.5 C) 98 F (36.7 C) 97.8 F (36.6 C) 97.8 F (36.6 C)  TempSrc: Oral Oral Oral Oral  SpO2: 99% 99% 100% 99%  Weight:      Height:       BG Fasting 8/6 = 98 8/5 2h PP dinner 129, lunch 99, breakfast 105 Fasting= 118   A/p: Marilyn Cooper 29 y.o. at [redacted]w[redacted]d HD#5 admitted with severe BP in setting of CHTN, now with superimposed preeclampsia by 24 hour urine protein.  1. CHTN with superimposed preeclampsia: -Medications: now on labetalol 600mg  BID and procardia 90xL.  -MFM consult recommendations: Delivery at 37 weeks since she meets SI preeclampsia by 24h urine protein.  Delivery at 34 weeks or greater should her PIH labs show any abnormalities, should she show any signs or symptoms of severe preeclampsia, or should she require increasing dosages of antihypertensive medications Twice-weekly fetal testing with weekly PIH labs should she be discharged home.  2. GDMA2: metformin 500/1000 now increased to 1000mg  BID. Patient has previously been counseled on insulin but has refused. Discussed again this AM importance of strict BG control. Patient very tearful during conversation because she reports her family members got worse/died on insulin.  3. Prematurity: s/p BMZ 8/3-4 5. Fetal testing: has been reassuring NST pending for this AM 6. Obesity: SCDs while in bed 7. Delivery plan: repeat CS  Likely discharge home today if reassuring fetal testing, remains asymptomatic and BP controlled.  Marilyn Cooper 08/23/19 9:06 AM

## 2019-08-23 NOTE — Discharge Instructions (Signed)
Preeclampsia and Eclampsia Preeclampsia is a serious condition that may develop during pregnancy. This condition causes high blood pressure and increased protein in your urine along with other symptoms, such as headaches and vision changes. These symptoms may develop as the condition gets worse. Preeclampsia may occur at 20 weeks of pregnancy or later. Diagnosing and treating preeclampsia early is very important. If not treated early, it can cause serious problems for you and your baby. One problem it can lead to is eclampsia. Eclampsia is a condition that causes muscle jerking or shaking (convulsions or seizures) and other serious problems for the mother. During pregnancy, delivering your baby may be the best treatment for preeclampsia or eclampsia. For most women, preeclampsia and eclampsia symptoms go away after giving birth. In rare cases, a woman may develop preeclampsia after giving birth (postpartum preeclampsia). This usually occurs within 48 hours after childbirth but may occur up to 6 weeks after giving birth. What are the causes? The cause of preeclampsia is not known. What increases the risk? The following risk factors make you more likely to develop preeclampsia:  Being pregnant for the first time.  Having had preeclampsia during a past pregnancy.  Having a family history of preeclampsia.  Having high blood pressure.  Being pregnant with more than one baby.  Being 35 or older.  Being African-American.  Having kidney disease or diabetes.  Having medical conditions such as lupus or blood diseases.  Being very overweight (obese). What are the signs or symptoms? The most common symptoms are:  Severe headaches.  Vision problems, such as blurred or double vision.  Abdominal pain, especially upper abdominal pain. Other symptoms that may develop as the condition gets worse include:  Sudden weight gain.  Sudden swelling of the hands, face, legs, and feet.  Severe nausea  and vomiting.  Numbness in the face, arms, legs, and feet.  Dizziness.  Urinating less than usual.  Slurred speech.  Convulsions or seizures. How is this diagnosed? There are no screening tests for preeclampsia. Your health care provider will ask you about symptoms and check for signs of preeclampsia during your prenatal visits. You may also have tests that include:  Checking your blood pressure.  Urine tests to check for protein. Your health care provider will check for this at every prenatal visit.  Blood tests.  Monitoring your baby's heart rate.  Ultrasound. How is this treated? You and your health care provider will determine the treatment approach that is best for you. Treatment may include:  Having more frequent prenatal exams to check for signs of preeclampsia, if you have an increased risk for preeclampsia.  Medicine to lower your blood pressure.  Staying in the hospital, if your condition is severe. There, treatment will focus on controlling your blood pressure and the amount of fluids in your body (fluid retention).  Taking medicine (magnesium sulfate) to prevent seizures. This may be given as an injection or through an IV.  Taking a low-dose aspirin during your pregnancy.  Delivering your baby early. You may have your labor started with medicine (induced), or you may have a cesarean delivery. Follow these instructions at home: Eating and drinking   Drink enough fluid to keep your urine pale yellow.  Avoid caffeine. Lifestyle  Do not use any products that contain nicotine or tobacco, such as cigarettes and e-cigarettes. If you need help quitting, ask your health care provider.  Do not use alcohol or drugs.  Avoid stress as much as possible. Rest and get   plenty of sleep. General instructions  Take over-the-counter and prescription medicines only as told by your health care provider.  When lying down, lie on your left side. This keeps pressure off your  major blood vessels.  When sitting or lying down, raise (elevate) your feet. Try putting some pillows underneath your lower legs.  Exercise regularly. Ask your health care provider what kinds of exercise are best for you.  Keep all follow-up and prenatal visits as told by your health care provider. This is important. How is this prevented? There is no known way of preventing preeclampsia or eclampsia from developing. However, to lower your risk of complications and detect problems early:  Get regular prenatal care. Your health care provider may be able to diagnose and treat the condition early.  Maintain a healthy weight. Ask your health care provider for help managing weight gain during pregnancy.  Work with your health care provider to manage any long-term (chronic) health conditions you have, such as diabetes or kidney problems.  You may have tests of your blood pressure and kidney function after giving birth.  Your health care provider may have you take low-dose aspirin during your next pregnancy. Contact a health care provider if:  You have symptoms that your health care provider told you may require more treatment or monitoring, such as: ? Headaches. ? Nausea or vomiting. ? Abdominal pain. ? Dizziness. ? Light-headedness. Get help right away if:  You have severe: ? Abdominal pain. ? Headaches that do not get better. ? Dizziness. ? Vision problems. ? Confusion. ? Nausea or vomiting.  You have any of the following: ? A seizure. ? Sudden, rapid weight gain. ? Sudden swelling in your hands, ankles, or face. ? Trouble moving any part of your body. ? Numbness in any part of your body. ? Trouble speaking. ? Abnormal bleeding.  You faint. Summary  Preeclampsia is a serious condition that may develop during pregnancy.  This condition causes high blood pressure and increased protein in your urine along with other symptoms, such as headaches and vision  changes.  Diagnosing and treating preeclampsia early is very important. If not treated early, it can cause serious problems for you and your baby.  Get help right away if you have symptoms that your health care provider told you to watch for. This information is not intended to replace advice given to you by your health care provider. Make sure you discuss any questions you have with your health care provider. Document Revised: 09/05/2017 Document Reviewed: 08/10/2015 Elsevier Patient Education  2020 [employer]  

## 2019-08-23 NOTE — Discharge Summary (Signed)
Antepartum Discharge Summary  Patient Name: Marilyn Cooper DOB: 1990/12/15 MRN: [id number]  Date of admission: 08/19/2019  Date of discharge: 08/23/2019  Admitting diagnosis: Benign essential hypertension antepartum in third trimester [O10.013] Chronic hypertension affecting pregnancy [O10.919]  Secondary diagnosis:  Active Problems:   Chronic hypertension with superimposed preeclampsia   Gestational diabetes Obesity     Discharge diagnosis: IUP @ [redacted]w[redacted]d, superimposed preeclampsia, CHTN, [facility] course: Marilyn Cooper 29 y.o. [id number] was admitted on 8/2 at [redacted]w[redacted]d from clinic due to severe range blood pressures. Her hospital course by problem is noted below:  1. CHTN with superimposed preeclampsia: On admission she was on procardia XL 60mg  daily and labetalol 400mg  BID, during hospital course received IV antihypertensives (8/2) and was increased to labetalol 600mg  BID and procardia 90xL. 24 hour urine protein significant for 800mg , ruling for for superimposed preeclampsia. Headache resolved with tylenol and Fioricet. No other symptoms of preeclampsia. Seen by MFM, see consult note 8/3 for recommendations.  2. GDMA2: metformin 500/1000 on admit was increased to 1000mg  BID. She was counseled on strict BG control and possibility of requiring insulin, especially in setting of recent betamethasone. She was also seen Diabetes Coordinator during hospitalization.  3. Prematurity: Received BMZ 8/3-4 4. Fetal testing reassuring during hospital stay 5. Obesity: SCDs while hospitalized and discussed importance of ambulation to prevent DVT 6. Delivery plan: repeat CS  On HD#5 patient was meeting goals for discharge. Plan for delivery at 37 weeks for superimposed preeclampsia, earlier if meets severe criteria.  Twice-weekly fetal testing with weekly PIH labs   Magnesium Sulfate received: No BMZ received: Yes, 8/3-4  Physical exam  Vitals:    08/22/19 2321 08/23/19 0624 08/23/19 0738 08/23/19 1054  BP: 140/69 137/69 (!) 144/77 122/73  Pulse: 83 82 80 88  Resp: 18 18 18 20   Temp: 97.8 F (36.6 C) 97.8 F (36.6 C) 98.3 F (36.8 C) 98.7 F (37.1 C)  TempSrc: Oral Oral Oral Oral  SpO2: 100% 99% 96% 100%  Weight:      Height:       Labs: Lab Results  Component Value Date   WBC 10.6 (H) 08/22/2019   HGB 9.0 (L) 08/22/2019   HCT 27.9 (L) 08/22/2019   MCV 81.1 08/22/2019   PLT 248 08/22/2019   CMP Latest Ref Rng & Units 08/22/2019  Glucose 70 - 99 mg/dL 10/22/2019)  BUN 6 - 20 mg/dL 7  Creatinine 10/22/2019 - 10/22/2019 mg/dL 10/22/2019  Sodium 10/22/2019 - 10/22/2019 mmol/L 136  Potassium 3.5 - 5.1 mmol/L 3.7  Chloride 98 - 111 mmol/L 106  CO2 22 - 32 mmol/L 17(L)  Calcium 8.9 - 10.3 mg/dL 9.3  Total Protein 6.5 - 8.1 g/dL 6.5  Total Bilirubin 0.3 - 1.2 mg/dL 672(C)  Alkaline Phos 38 - 126 U/L 70  AST 15 - 41 U/L 19  ALT 0 - 44 U/L 12   Edinburgh Score: No flowsheet data found.    After visit meds:  Allergies as of 08/23/2019   No Known Allergies     Medication List    TAKE these medications   aspirin EC 81 MG tablet Take 81 mg by mouth daily. Swallow whole.   cyclobenzaprine 10 MG tablet Commonly  known as: FLEXERIL Take 1 tablet (10 mg total) by mouth 3 (three) times daily as needed for muscle spasms.   diphenhydrAMINE 25 MG tablet Commonly known as: BENADRYL Take 25 mg by mouth 2 (two) times daily.   IRON PO Take 325 mg by mouth 2 (two) times daily.   labetalol 300 MG tablet Commonly known as: NORMODYNE Take 2 tablets (600 mg total) by mouth 2 (two) times daily for 25 days. What changed:   medication strength  how much to take Notes to patient: Take as close to 12 hours apart as possible to keep blood levels of medication more even.  In the hospital this medication was at 10:00 am and 10:00 pm.   metFORMIN 500 MG tablet Commonly known as: GLUCOPHAGE Take 2 tablets (1,000 mg total) by mouth in the morning and at bedtime for  25 days. 500mg  in the morning 1000mg  at night What changed:   how much to take  when to take this   NIFEdipine 90 MG 24 hr tablet Commonly known as: PROCARDIA XL/NIFEDICAL-XL Take 90 mg by mouth daily. Notes to patient: Take as close to the same time each day to keep blood levels of medication more even.  In the hospital this medication was at 10:00am.   PRENATAL VITAMIN PO Take 1 tablet by mouth daily.        Discharge home in stable condition  Future Appointments: Future Appointments  Date Time Provider Department Center  08/27/2019  2:00 PM , 10/27/2019 [facility] NDM  08/30/2019  8:30 AM [facility] NURSE [facility]  08/30/2019  8:45 AM [facility] US5 [facility]: [place] Rhonda W  09/06/2019  8:30 AM [facility] NURSE [facility]  09/06/2019  8:45 AM [facility] US4 [facility]   Follow up Visit:  Follow-up Information    [facility] Follow up.   Specialty: Obstetrics and Gynecology Why: Blood pressure visit on 8/9 or 8/10. Our office will call you to schedule. If you do not hear from them by the end of the day 8/6 please call to schedule, visit and NST Contact information: [address] [place] [address] [place] [phone]                   08/23/2019 [phone], MD

## 2019-08-27 ENCOUNTER — Encounter: Payer: Medicaid Other | Attending: Obstetrics | Admitting: Registered"

## 2019-08-27 ENCOUNTER — Other Ambulatory Visit: Payer: Self-pay

## 2019-08-27 ENCOUNTER — Encounter: Payer: Self-pay | Admitting: Registered"

## 2019-08-27 DIAGNOSIS — O99810 Abnormal glucose complicating pregnancy: Secondary | ICD-10-CM | POA: Insufficient documentation

## 2019-08-27 DIAGNOSIS — Z713 Dietary counseling and surveillance: Secondary | ICD-10-CM

## 2019-08-27 NOTE — Progress Notes (Signed)
Appointment start time: 2:04  Appointment end time: 3:05  Patient was seen on 08/27/2019 for nutrition counseling pertaining to disordered eating  Primary care provider: Dena Billet, [place] Therapist: none  ROI: N/A Any other medical team members: Jerelyn Charles, MD  This note is not being shared with the patient for the following reason: To prevent harm (release of this note would result in harm to the life or physical safety of the patient or another).   Assessment  States she was in the hospital due to increased BP last week, Mon- Fri. States she was disappointed she had to cancel therapist appt that was scheduled during time of hospital stay. States she felt out of control. States she had a hard time following diabetes recommendations for food intake. States they were trying to give her too much carbohydrates in her opinion. States she thought sugar content in food were directly related to blood sugar numbers and misunderstanding of how carbohydrates affect blood sugar numbers.   States she tries not to focus on numbers because it is triggering for her. States it triggers her BED which originated from being told what she could and could not.   States she is checking BS 4x/day: FBS (82-94) and 2 hrs after meals (98-117).   Previous appt: States she had A1c was done 6/23. Reports previous A1c was 6.7. Starting to have water bottle (40 oz) on her daily to help stay hydrated. Reports she was told to aim for FBS numbers <110. OBGYN told her to check BS at 2am. States she has been trying to avoid carbohydrates due to diabetes.  States she overanalyzes each meal. Reports family history of type 2 diabetes.   Lives with husband and 2 children.    Eating history: Length of time: 6 years ago Previous treatments: none reported Goals for RD meetings: normalize relationship with food  Weight history:  Highest weight:    Lowest weight: 230 Most consistent weight:   What would you like to weigh: How  has weight changed in the past year:   Medical Information:  Changes in hair, skin, nails since ED started: not reported Chewing/swallowing difficulties not reported Reflux or heartburn not reported Trouble with teeth not reported LMP without the use of hormones: not reported  Weight at that point: not reported Effect of exercise on menses: not reported   Effect of hormones on menses: not reported Constipation, diarrhea: not reported Dizziness/lightheadedness: not reported Headaches/body aches: not reported Heart racing/chest pain: not reported Mood: not reported Sleep: not reported Focus/concentration: not reported Cold intolerance: not reported Vision changes: not reported  Mental health diagnosis:    Dietary assessment: A typical day consists of 3 meals and 1-2 snacks  Safe foods include:  Avoided foods include:  24 hour recall:  B (10 am): bagel  S: snack break - nuts, cheese, dried fruit L (2 pm): wrap + deli meat S (4 pm): watermelon D (7 pm): corn dogs + fruit + mini mac and cheese (plant-based) S (11 pm): binge-eat  Beverages: water (2.5*40 oz; 100 oz),   What Methods Do You Use To Control Your Weight (Compensatory behaviors)?           Restricting (calories, fat, carbs)  SIV  Diet pills  Laxatives  Diuretics  Alcohol or drugs  Exercise (what type)  Food rules or rituals (explain)  Binge  Estimated energy needs: 2800-2900 kcal 350-363 g CHO 210-218 g pro 62-64 g fat  Nutrition Diagnosis: NB-1.5 Disordered eating pattern As related  to binge eating.  As evidenced by pt reports eating for 2-3 hours at a time.  Intervention/Goals: Mainly listened. Educated pt on carbohydrates and reading food labels.   Meal plan:    3 meals    2 snacks  Monitoring and Evaluation: Patient will follow up prn. Pt will be induced for delivery in 3 weeks. Will call to schedule follow-up appt.

## 2019-08-29 ENCOUNTER — Other Ambulatory Visit: Payer: Self-pay

## 2019-08-29 ENCOUNTER — Inpatient Hospital Stay (HOSPITAL_COMMUNITY): Payer: Medicaid Other | Admitting: Anesthesiology

## 2019-08-29 ENCOUNTER — Encounter (HOSPITAL_COMMUNITY)
Admission: AD | Disposition: A | Discharge status: Home / Self Care | Payer: Self-pay | Source: Home / Self Care | Attending: Obstetrics and Gynecology

## 2019-08-29 ENCOUNTER — Inpatient Hospital Stay (HOSPITAL_COMMUNITY): Admit: 2019-08-29 | Payer: Medicaid Other | Admitting: Obstetrics

## 2019-08-29 ENCOUNTER — Inpatient Hospital Stay (HOSPITAL_COMMUNITY)
Admission: AD | Admit: 2019-08-29 | Discharge: 2019-09-01 | DRG: 787 | Disposition: A | Discharge status: Home / Self Care | Payer: Medicaid Other | Attending: Obstetrics and Gynecology | Admitting: Obstetrics and Gynecology

## 2019-08-29 ENCOUNTER — Encounter (HOSPITAL_COMMUNITY): Payer: Self-pay | Admitting: Obstetrics and Gynecology

## 2019-08-29 DIAGNOSIS — O1002 Pre-existing essential hypertension complicating childbirth: Secondary | ICD-10-CM | POA: Diagnosis present

## 2019-08-29 DIAGNOSIS — O114 Pre-existing hypertension with pre-eclampsia, complicating childbirth: Secondary | ICD-10-CM | POA: Diagnosis present

## 2019-08-29 DIAGNOSIS — O34211 Maternal care for low transverse scar from previous cesarean delivery: Secondary | ICD-10-CM | POA: Diagnosis present

## 2019-08-29 DIAGNOSIS — O24425 Gestational diabetes mellitus in childbirth, controlled by oral hypoglycemic drugs: Secondary | ICD-10-CM | POA: Diagnosis present

## 2019-08-29 DIAGNOSIS — O113 Pre-existing hypertension with pre-eclampsia, third trimester: Secondary | ICD-10-CM

## 2019-08-29 DIAGNOSIS — O24415 Gestational diabetes mellitus in pregnancy, controlled by oral hypoglycemic drugs: Secondary | ICD-10-CM

## 2019-08-29 DIAGNOSIS — D62 Acute posthemorrhagic anemia: Secondary | ICD-10-CM | POA: Diagnosis not present

## 2019-08-29 DIAGNOSIS — O99824 Streptococcus B carrier state complicating childbirth: Secondary | ICD-10-CM | POA: Diagnosis present

## 2019-08-29 DIAGNOSIS — O99214 Obesity complicating childbirth: Secondary | ICD-10-CM | POA: Diagnosis present

## 2019-08-29 DIAGNOSIS — R319 Hematuria, unspecified: Secondary | ICD-10-CM | POA: Diagnosis present

## 2019-08-29 DIAGNOSIS — O9081 Anemia of the puerperium: Secondary | ICD-10-CM | POA: Diagnosis not present

## 2019-08-29 DIAGNOSIS — O99893 Other specified diseases and conditions complicating puerperium: Secondary | ICD-10-CM | POA: Diagnosis present

## 2019-08-29 DIAGNOSIS — Z20822 Contact with and (suspected) exposure to covid-19: Secondary | ICD-10-CM | POA: Diagnosis present

## 2019-08-29 DIAGNOSIS — Z3A34 34 weeks gestation of pregnancy: Secondary | ICD-10-CM

## 2019-08-29 DIAGNOSIS — R519 Headache, unspecified: Secondary | ICD-10-CM | POA: Diagnosis present

## 2019-08-29 DIAGNOSIS — O119 Pre-existing hypertension with pre-eclampsia, unspecified trimester: Secondary | ICD-10-CM | POA: Diagnosis present

## 2019-08-29 DIAGNOSIS — O10913 Unspecified pre-existing hypertension complicating pregnancy, third trimester: Secondary | ICD-10-CM

## 2019-08-29 HISTORY — PX: BLADDER REPAIR: SHX6721

## 2019-08-29 HISTORY — PX: LAPAROTOMY: SHX154

## 2019-08-29 HISTORY — PX: CYSTOSCOPY: SHX5120

## 2019-08-29 HISTORY — DX: Essential (primary) hypertension: I10

## 2019-08-29 LAB — POCT I-STAT EG7
Acid-base deficit: 7 mmol/L — ABNORMAL HIGH (ref 0.0–2.0)
Bicarbonate: 16.9 mmol/L — ABNORMAL LOW (ref 20.0–28.0)
Calcium, Ion: 1.17 mmol/L (ref 1.15–1.40)
HCT: 24 % — ABNORMAL LOW (ref 36.0–46.0)
Hemoglobin: 8.2 g/dL — ABNORMAL LOW (ref 12.0–15.0)
O2 Saturation: 93 %
Potassium: 4.4 mmol/L (ref 3.5–5.1)
Sodium: 136 mmol/L (ref 135–145)
TCO2: 18 mmol/L — ABNORMAL LOW (ref 22–32)
pCO2, Ven: 26.6 mmHg — ABNORMAL LOW (ref 44.0–60.0)
pH, Ven: 7.41 (ref 7.250–7.430)
pO2, Ven: 65 mmHg — ABNORMAL HIGH (ref 32.0–45.0)

## 2019-08-29 LAB — CBC
HCT: 27.9 % — ABNORMAL LOW (ref 36.0–46.0)
Hemoglobin: 8.9 g/dL — ABNORMAL LOW (ref 12.0–15.0)
MCH: 25.2 pg — ABNORMAL LOW (ref 26.0–34.0)
MCHC: 31.9 g/dL (ref 30.0–36.0)
MCV: 79 fL — ABNORMAL LOW (ref 80.0–100.0)
Platelets: 263 10*3/uL (ref 150–400)
RBC: 3.53 MIL/uL — ABNORMAL LOW (ref 3.87–5.11)
RDW: 13.9 % (ref 11.5–15.5)
WBC: 8.7 10*3/uL (ref 4.0–10.5)
nRBC: 0 % (ref 0.0–0.2)

## 2019-08-29 LAB — URINALYSIS, ROUTINE W REFLEX MICROSCOPIC
Bilirubin Urine: NEGATIVE
Glucose, UA: NEGATIVE mg/dL
Ketones, ur: 5 mg/dL — AB
Nitrite: NEGATIVE
Protein, ur: 30 mg/dL — AB
Specific Gravity, Urine: 1.025 (ref 1.005–1.030)
pH: 6 (ref 5.0–8.0)

## 2019-08-29 LAB — COMPREHENSIVE METABOLIC PANEL
ALT: 11 U/L (ref 0–44)
AST: 11 U/L — ABNORMAL LOW (ref 15–41)
Albumin: 2.7 g/dL — ABNORMAL LOW (ref 3.5–5.0)
Alkaline Phosphatase: 76 U/L (ref 38–126)
Anion gap: 11 (ref 5–15)
BUN: 6 mg/dL (ref 6–20)
CO2: 20 mmol/L — ABNORMAL LOW (ref 22–32)
Calcium: 9.5 mg/dL (ref 8.9–10.3)
Chloride: 105 mmol/L (ref 98–111)
Creatinine, Ser: 0.56 mg/dL (ref 0.44–1.00)
GFR calc Af Amer: >60 mL/min (ref >60)
GFR calc non Af Amer: >60 mL/min (ref >60)
Glucose, Bld: 107 mg/dL — ABNORMAL HIGH (ref 70–99)
Potassium: 4.1 mmol/L (ref 3.5–5.1)
Sodium: 136 mmol/L (ref 135–145)
Total Bilirubin: 0.2 mg/dL — ABNORMAL LOW (ref 0.3–1.2)
Total Protein: 6.4 g/dL — ABNORMAL LOW (ref 6.5–8.1)

## 2019-08-29 LAB — PREPARE RBC (CROSSMATCH)

## 2019-08-29 LAB — PROTEIN / CREATININE RATIO, URINE
Creatinine, Urine: 173.32 mg/dL
Protein Creatinine Ratio: 0.18 mg/mg{Cre} — ABNORMAL HIGH (ref 0.00–0.15)
Total Protein, Urine: 31 mg/dL

## 2019-08-29 LAB — SARS CORONAVIRUS 2 BY RT PCR (HOSPITAL ORDER, PERFORMED IN ~~LOC~~ HOSPITAL LAB): SARS Coronavirus 2: NEGATIVE

## 2019-08-29 LAB — GLUCOSE, CAPILLARY
Glucose-Capillary: 108 mg/dL — ABNORMAL HIGH (ref 70–99)
Glucose-Capillary: 153 mg/dL — ABNORMAL HIGH (ref 70–99)
Glucose-Capillary: 125 mg/dL — ABNORMAL HIGH (ref 70–99)

## 2019-08-29 SURGERY — CYSTOSCOPY
Anesthesia: Spinal

## 2019-08-29 SURGERY — Surgical Case
Anesthesia: Spinal

## 2019-08-29 MED ORDER — ZOLPIDEM TARTRATE 5 MG PO TABS: 5.0000 mg | ORAL_TABLET | Freq: Every evening | ORAL | Status: DC | PRN

## 2019-08-29 MED ORDER — PHENYLEPHRINE 40 MCG/ML (10ML) SYRINGE FOR IV PUSH (FOR BLOOD PRESSURE SUPPORT)
PREFILLED_SYRINGE | INTRAVENOUS | Status: DC | PRN
  Administered 2019-08-29: 120 ug via INTRAVENOUS

## 2019-08-29 MED ORDER — SODIUM CHLORIDE 0.9% IV SOLUTION: Freq: Once | INTRAVENOUS | Status: DC

## 2019-08-29 MED ORDER — ONDANSETRON HCL 4 MG/2ML IJ SOLN
INTRAMUSCULAR | Status: DC | PRN
  Administered 2019-08-29: 4 mg via INTRAVENOUS

## 2019-08-29 MED ORDER — DEXTROSE 5 % IV SOLN
3.0000 g | INTRAVENOUS | Status: AC
  Administered 2019-08-29: 3 g via INTRAVENOUS
  Filled 2019-08-29 (×2): qty 3000

## 2019-08-29 MED ORDER — NALBUPHINE HCL 10 MG/ML IJ SOLN: 5.0000 mg | Freq: Once | INTRAMUSCULAR | Status: DC | PRN

## 2019-08-29 MED ORDER — MORPHINE SULFATE (PF) 0.5 MG/ML IJ SOLN
INTRAMUSCULAR | Status: AC
  Filled 2019-08-29: qty 10

## 2019-08-29 MED ORDER — BUPIVACAINE IN DEXTROSE 0.75-8.25 % IT SOLN
INTRATHECAL | Status: DC | PRN
  Administered 2019-08-29: 2 mL via INTRATHECAL

## 2019-08-29 MED ORDER — ONDANSETRON HCL 4 MG/2ML IJ SOLN
INTRAMUSCULAR | Status: AC
  Filled 2019-08-29: qty 2

## 2019-08-29 MED ORDER — OXYCODONE HCL 5 MG PO TABS: 5.0000 mg | ORAL_TABLET | Freq: Once | ORAL | Status: DC | PRN

## 2019-08-29 MED ORDER — OXYTOCIN-SODIUM CHLORIDE 30-0.9 UT/500ML-% IV SOLN
INTRAVENOUS | Status: AC
  Filled 2019-08-29: qty 500

## 2019-08-29 MED ORDER — METOCLOPRAMIDE HCL 5 MG/ML IJ SOLN
10.0000 mg | Freq: Once | INTRAMUSCULAR | Status: AC
  Administered 2019-08-29: 10 mg via INTRAVENOUS
  Filled 2019-08-29: qty 2

## 2019-08-29 MED ORDER — METHYLENE BLUE 0.5 % INJ SOLN
INTRAVENOUS | Status: AC
  Filled 2019-08-29: qty 10

## 2019-08-29 MED ORDER — MEPERIDINE HCL 25 MG/ML IJ SOLN: 6.2500 mg | INTRAMUSCULAR | Status: DC | PRN

## 2019-08-29 MED ORDER — DIPHENHYDRAMINE HCL 25 MG PO CAPS: 25.0000 mg | ORAL_CAPSULE | Freq: Four times a day (QID) | ORAL | Status: DC | PRN

## 2019-08-29 MED ORDER — LIDOCAINE HCL 1 % IJ SOLN
INTRAMUSCULAR | Status: AC
  Filled 2019-08-29: qty 20

## 2019-08-29 MED ORDER — SIMETHICONE 80 MG PO CHEW
80.0000 mg | CHEWABLE_TABLET | Freq: Three times a day (TID) | ORAL | Status: DC
  Administered 2019-08-30 – 2019-08-31 (×6): 80 mg via ORAL
  Filled 2019-08-29 (×7): qty 1

## 2019-08-29 MED ORDER — ENOXAPARIN SODIUM 80 MG/0.8ML ~~LOC~~ SOLN
70.0000 mg | SUBCUTANEOUS | Status: DC
  Administered 2019-08-30 – 2019-08-31 (×2): 70 mg via SUBCUTANEOUS
  Filled 2019-08-29 (×2): qty 0.8

## 2019-08-29 MED ORDER — DIPHENHYDRAMINE HCL 50 MG/ML IJ SOLN
25.0000 mg | Freq: Once | INTRAMUSCULAR | Status: AC
  Administered 2019-08-29: 25 mg via INTRAVENOUS
  Filled 2019-08-29: qty 1

## 2019-08-29 MED ORDER — PHENYLEPHRINE 40 MCG/ML (10ML) SYRINGE FOR IV PUSH (FOR BLOOD PRESSURE SUPPORT)
PREFILLED_SYRINGE | INTRAVENOUS | Status: AC
  Filled 2019-08-29: qty 10

## 2019-08-29 MED ORDER — BUPIVACAINE IN DEXTROSE 0.75-8.25 % IT SOLN
INTRATHECAL | Status: AC
  Filled 2019-08-29: qty 2

## 2019-08-29 MED ORDER — FENTANYL CITRATE (PF) 100 MCG/2ML IJ SOLN: 25.0000 ug | INTRAMUSCULAR | Status: DC | PRN

## 2019-08-29 MED ORDER — SIMETHICONE 80 MG PO CHEW
80.0000 mg | CHEWABLE_TABLET | ORAL | Status: DC
  Administered 2019-08-30 – 2019-09-01 (×3): 80 mg via ORAL
  Filled 2019-08-29 (×3): qty 1

## 2019-08-29 MED ORDER — SIMETHICONE 80 MG PO CHEW: 80.0000 mg | CHEWABLE_TABLET | ORAL | Status: DC | PRN

## 2019-08-29 MED ORDER — MIDAZOLAM HCL 2 MG/2ML IJ SOLN
INTRAMUSCULAR | Status: AC
  Filled 2019-08-29: qty 2

## 2019-08-29 MED ORDER — DEXTROSE 5 % IV SOLN
INTRAVENOUS | Status: DC | PRN
  Administered 2019-08-29: 3 g via INTRAVENOUS

## 2019-08-29 MED ORDER — MAGNESIUM SULFATE BOLUS VIA INFUSION
6.0000 g | Freq: Once | INTRAVENOUS | Status: AC
  Administered 2019-08-29: 6 g via INTRAVENOUS
  Filled 2019-08-29: qty 1000

## 2019-08-29 MED ORDER — ONDANSETRON HCL 4 MG/2ML IJ SOLN: 4.0000 mg | Freq: Once | INTRAMUSCULAR | Status: DC | PRN

## 2019-08-29 MED ORDER — SODIUM CHLORIDE 0.9% FLUSH: 3.0000 mL | INTRAVENOUS | Status: DC | PRN

## 2019-08-29 MED ORDER — LACTATED RINGERS IV SOLN: INTRAVENOUS | Status: DC | PRN

## 2019-08-29 MED ORDER — NALBUPHINE HCL 10 MG/ML IJ SOLN: 5.0000 mg | INTRAMUSCULAR | Status: DC | PRN

## 2019-08-29 MED ORDER — DEXMEDETOMIDINE (PRECEDEX) IN NS 20 MCG/5ML (4 MCG/ML) IV SYRINGE
PREFILLED_SYRINGE | INTRAVENOUS | Status: AC
  Filled 2019-08-29: qty 5

## 2019-08-29 MED ORDER — HYDRALAZINE HCL 20 MG/ML IJ SOLN: 10.0000 mg | INTRAMUSCULAR | Status: DC | PRN

## 2019-08-29 MED ORDER — METFORMIN HCL 500 MG PO TABS
500.0000 mg | ORAL_TABLET | Freq: Two times a day (BID) | ORAL | Status: DC
  Administered 2019-08-30 – 2019-09-01 (×5): 500 mg via ORAL
  Filled 2019-08-29 (×5): qty 1

## 2019-08-29 MED ORDER — SODIUM CHLORIDE 0.9 % IV SOLN: INTRAVENOUS | Status: DC | PRN

## 2019-08-29 MED ORDER — PHENYLEPHRINE HCL-NACL 20-0.9 MG/250ML-% IV SOLN
INTRAVENOUS | Status: DC | PRN
  Administered 2019-08-29: 30 ug/min via INTRAVENOUS

## 2019-08-29 MED ORDER — SCOPOLAMINE 1 MG/3DAYS TD PT72
1.0000 | MEDICATED_PATCH | Freq: Once | TRANSDERMAL | Status: DC
  Filled 2019-08-29: qty 1

## 2019-08-29 MED ORDER — PHENYLEPHRINE HCL-NACL 20-0.9 MG/250ML-% IV SOLN
INTRAVENOUS | Status: AC
  Filled 2019-08-29: qty 250

## 2019-08-29 MED ORDER — LACTATED RINGERS IV SOLN: INTRAVENOUS | Status: DC

## 2019-08-29 MED ORDER — DEXTROSE 5 % IV SOLN
INTRAVENOUS | Status: AC
  Filled 2019-08-29: qty 3000

## 2019-08-29 MED ORDER — PRENATAL MULTIVITAMIN CH
1.0000 | ORAL_TABLET | Freq: Every day | ORAL | Status: DC
  Administered 2019-08-30 – 2019-09-01 (×3): 1 via ORAL
  Filled 2019-08-29 (×3): qty 1

## 2019-08-29 MED ORDER — DEXAMETHASONE SODIUM PHOSPHATE 10 MG/ML IJ SOLN
INTRAMUSCULAR | Status: AC
  Filled 2019-08-29: qty 1

## 2019-08-29 MED ORDER — DIPHENHYDRAMINE HCL 25 MG PO CAPS: 25.0000 mg | ORAL_CAPSULE | ORAL | Status: DC | PRN

## 2019-08-29 MED ORDER — FENTANYL CITRATE (PF) 100 MCG/2ML IJ SOLN
INTRAMUSCULAR | Status: DC | PRN
  Administered 2019-08-29: 50 ug via INTRAVENOUS

## 2019-08-29 MED ORDER — MAGNESIUM SULFATE 40 GM/1000ML IV SOLN
2.0000 g/h | INTRAVENOUS | Status: AC
  Administered 2019-08-29 – 2019-08-30 (×2): 2 g/h via INTRAVENOUS
  Filled 2019-08-29 (×2): qty 1000

## 2019-08-29 MED ORDER — COCONUT OIL OIL
1.0000 "application " | TOPICAL_OIL | Status: DC | PRN
  Administered 2019-09-01: 1 via TOPICAL

## 2019-08-29 MED ORDER — KETOROLAC TROMETHAMINE 30 MG/ML IJ SOLN
30.0000 mg | Freq: Four times a day (QID) | INTRAMUSCULAR | Status: AC | PRN
  Administered 2019-08-30 (×2): 30 mg via INTRAVENOUS
  Filled 2019-08-29 (×2): qty 1

## 2019-08-29 MED ORDER — LABETALOL HCL 200 MG PO TABS
600.0000 mg | ORAL_TABLET | Freq: Two times a day (BID) | ORAL | Status: DC
  Administered 2019-08-30 (×2): 600 mg via ORAL
  Filled 2019-08-29 (×2): qty 3

## 2019-08-29 MED ORDER — LIDOCAINE HCL (PF) 2 % IJ SOLN
INTRAMUSCULAR | Status: AC
  Filled 2019-08-29: qty 5

## 2019-08-29 MED ORDER — DEXMEDETOMIDINE (PRECEDEX) IN NS 20 MCG/5ML (4 MCG/ML) IV SYRINGE
PREFILLED_SYRINGE | INTRAVENOUS | Status: DC | PRN
  Administered 2019-08-29: 8 ug via INTRAVENOUS
  Administered 2019-08-29 (×2): 4 ug via INTRAVENOUS
  Administered 2019-08-29: 8 ug via INTRAVENOUS

## 2019-08-29 MED ORDER — LABETALOL HCL 5 MG/ML IV SOLN: 40.0000 mg | INTRAVENOUS | Status: DC | PRN

## 2019-08-29 MED ORDER — DIBUCAINE (PERIANAL) 1 % EX OINT: 1.0000 "application " | TOPICAL_OINTMENT | CUTANEOUS | Status: DC | PRN

## 2019-08-29 MED ORDER — SOD CITRATE-CITRIC ACID 500-334 MG/5ML PO SOLN
30.0000 mL | Freq: Once | ORAL | Status: AC
  Administered 2019-08-29: 30 mL via ORAL
  Filled 2019-08-29: qty 30

## 2019-08-29 MED ORDER — LABETALOL HCL 5 MG/ML IV SOLN: 80.0000 mg | INTRAVENOUS | Status: DC | PRN

## 2019-08-29 MED ORDER — NIFEDIPINE ER OSMOTIC RELEASE 30 MG PO TB24
90.0000 mg | ORAL_TABLET | Freq: Every day | ORAL | Status: DC
  Administered 2019-08-30: 90 mg via ORAL
  Filled 2019-08-29: qty 3

## 2019-08-29 MED ORDER — METHYLENE BLUE 0.5 % INJ SOLN
INTRAVENOUS | Status: DC | PRN
  Administered 2019-08-29: 50 mg via INTRAVENOUS

## 2019-08-29 MED ORDER — WITCH HAZEL-GLYCERIN EX PADS: 1.0000 "application " | MEDICATED_PAD | CUTANEOUS | Status: DC | PRN

## 2019-08-29 MED ORDER — NALOXONE HCL 0.4 MG/ML IJ SOLN: 0.4000 mg | INTRAMUSCULAR | Status: DC | PRN

## 2019-08-29 MED ORDER — FENTANYL CITRATE (PF) 100 MCG/2ML IJ SOLN
INTRAMUSCULAR | Status: DC | PRN
  Administered 2019-08-29: 15 ug via INTRATHECAL

## 2019-08-29 MED ORDER — TETANUS-DIPHTH-ACELL PERTUSSIS 5-2.5-18.5 LF-MCG/0.5 IM SUSP: 0.5000 mL | Freq: Once | INTRAMUSCULAR | Status: DC

## 2019-08-29 MED ORDER — EPINEPHRINE PF 1 MG/ML IJ SOLN
INTRAMUSCULAR | Status: AC
  Filled 2019-08-29: qty 1

## 2019-08-29 MED ORDER — KETOROLAC TROMETHAMINE 30 MG/ML IJ SOLN: 30.0000 mg | Freq: Four times a day (QID) | INTRAMUSCULAR | Status: AC | PRN

## 2019-08-29 MED ORDER — PHENYLEPHRINE HCL (PRESSORS) 10 MG/ML IV SOLN
INTRAVENOUS | Status: DC | PRN
Start: 2019-08-29 — End: 2019-08-29
  Administered 2019-08-29: 40 ug via INTRAVENOUS
  Administered 2019-08-29: 120 ug via INTRAVENOUS
  Administered 2019-08-29: 160 ug via INTRAVENOUS
  Administered 2019-08-29: 80 ug via INTRAVENOUS
  Administered 2019-08-29: 160 ug via INTRAVENOUS

## 2019-08-29 MED ORDER — ACETAMINOPHEN 10 MG/ML IV SOLN: 1000.0000 mg | Freq: Once | INTRAVENOUS | Status: DC | PRN

## 2019-08-29 MED ORDER — OXYTOCIN-SODIUM CHLORIDE 30-0.9 UT/500ML-% IV SOLN
INTRAVENOUS | Status: DC | PRN
  Administered 2019-08-29: 30 [IU] via INTRAVENOUS

## 2019-08-29 MED ORDER — AMISULPRIDE (ANTIEMETIC) 5 MG/2ML IV SOLN: 10.0000 mg | Freq: Once | INTRAVENOUS | Status: DC | PRN

## 2019-08-29 MED ORDER — DIPHENHYDRAMINE HCL 50 MG/ML IJ SOLN: 12.5000 mg | INTRAMUSCULAR | Status: DC | PRN

## 2019-08-29 MED ORDER — FENTANYL CITRATE (PF) 100 MCG/2ML IJ SOLN
INTRAMUSCULAR | Status: AC
  Filled 2019-08-29: qty 2

## 2019-08-29 MED ORDER — DEXAMETHASONE SODIUM PHOSPHATE 10 MG/ML IJ SOLN
INTRAMUSCULAR | Status: DC | PRN
Start: 2019-08-29 — End: 2019-08-29
  Administered 2019-08-29: 10 mg via INTRAVENOUS

## 2019-08-29 MED ORDER — MIDAZOLAM HCL 5 MG/5ML IJ SOLN
INTRAMUSCULAR | Status: DC | PRN
  Administered 2019-08-29 (×2): 1 mg via INTRAVENOUS

## 2019-08-29 MED ORDER — DEXAMETHASONE SODIUM PHOSPHATE 10 MG/ML IJ SOLN
10.0000 mg | Freq: Once | INTRAMUSCULAR | Status: AC
  Administered 2019-08-29: 10 mg via INTRAVENOUS
  Filled 2019-08-29: qty 1

## 2019-08-29 MED ORDER — OXYCODONE HCL 5 MG/5ML PO SOLN: 5.0000 mg | Freq: Once | ORAL | Status: DC | PRN

## 2019-08-29 MED ORDER — ONDANSETRON HCL 4 MG/2ML IJ SOLN: 4.0000 mg | Freq: Three times a day (TID) | INTRAMUSCULAR | Status: DC | PRN

## 2019-08-29 MED ORDER — SODIUM BICARBONATE 8.4 % IV SOLN
INTRAVENOUS | Status: DC | PRN
  Administered 2019-08-29 (×4): 5 mL via EPIDURAL

## 2019-08-29 MED ORDER — NALOXONE HCL 4 MG/10ML IJ SOLN
1.0000 ug/kg/h | INTRAVENOUS | Status: DC | PRN
  Filled 2019-08-29: qty 5

## 2019-08-29 MED ORDER — FAMOTIDINE IN NACL 20-0.9 MG/50ML-% IV SOLN
20.0000 mg | Freq: Once | INTRAVENOUS | Status: AC
  Administered 2019-08-29: 20 mg via INTRAVENOUS
  Filled 2019-08-29: qty 50

## 2019-08-29 MED ORDER — SENNOSIDES-DOCUSATE SODIUM 8.6-50 MG PO TABS
2.0000 | ORAL_TABLET | ORAL | Status: DC
  Administered 2019-08-30 – 2019-09-01 (×3): 2 via ORAL
  Filled 2019-08-29 (×3): qty 2

## 2019-08-29 MED ORDER — MORPHINE SULFATE (PF) 0.5 MG/ML IJ SOLN
INTRAMUSCULAR | Status: DC | PRN
  Administered 2019-08-29: .15 mg via INTRATHECAL

## 2019-08-29 MED ORDER — MENTHOL 3 MG MT LOZG: 1.0000 | LOZENGE | OROMUCOSAL | Status: DC | PRN

## 2019-08-29 MED ORDER — OXYTOCIN-SODIUM CHLORIDE 30-0.9 UT/500ML-% IV SOLN: 2.5000 [IU]/h | INTRAVENOUS | Status: AC

## 2019-08-29 MED ORDER — LACTATED RINGERS IV BOLUS
1000.0000 mL | Freq: Once | INTRAVENOUS | Status: AC
  Administered 2019-08-29: 1000 mL via INTRAVENOUS

## 2019-08-29 MED ORDER — LABETALOL HCL 5 MG/ML IV SOLN: 20.0000 mg | INTRAVENOUS | Status: DC | PRN

## 2019-08-29 SURGICAL SUPPLY — 47 items
ADH SKN CLS APL DERMABOND .7 (GAUZE/BANDAGES/DRESSINGS) ×2
APL SKNCLS STERI-STRIP NONHPOA (GAUZE/BANDAGES/DRESSINGS) ×2
BENZOIN TINCTURE PRP APPL 2/3 (GAUZE/BANDAGES/DRESSINGS) ×5 IMPLANT
CHLORAPREP W/TINT 26ML (MISCELLANEOUS) ×3 IMPLANT
CLAMP CORD UMBIL (MISCELLANEOUS) IMPLANT
CLOSURE WOUND 1/2 X4 (GAUZE/BANDAGES/DRESSINGS) ×1
CLOTH BEACON ORANGE TIMEOUT ST (SAFETY) ×3 IMPLANT
DERMABOND ADVANCED (GAUZE/BANDAGES/DRESSINGS) ×4
DERMABOND ADVANCED .7 DNX12 (GAUZE/BANDAGES/DRESSINGS) IMPLANT
DRSG OPSITE POSTOP 4X10 (GAUZE/BANDAGES/DRESSINGS) ×3 IMPLANT
ELECT REM PT RETURN 9FT ADLT (ELECTROSURGICAL) ×3
ELECTRODE REM PT RTRN 9FT ADLT (ELECTROSURGICAL) ×1 IMPLANT
EXTENDER TRAXI PANNICULUS (MISCELLANEOUS) IMPLANT
EXTRACTOR VACUUM KIWI (MISCELLANEOUS) IMPLANT
GLOVE BIOGEL PI IND STRL 6.5 (GLOVE) ×1 IMPLANT
GLOVE BIOGEL PI IND STRL 7.0 (GLOVE) ×1 IMPLANT
GLOVE BIOGEL PI INDICATOR 6.5 (GLOVE) ×2
GLOVE BIOGEL PI INDICATOR 7.0 (GLOVE) ×2
GLOVE ECLIPSE 6.0 STRL STRAW (GLOVE) ×3 IMPLANT
GOWN STRL REUS W/TWL LRG LVL3 (GOWN DISPOSABLE) ×6 IMPLANT
KIT ABG SYR 3ML LUER SLIP (SYRINGE) IMPLANT
KIT PREVENA INCISION MGT20CM45 (CANNISTER) ×2 IMPLANT
NDL HYPO 25X5/8 SAFETYGLIDE (NEEDLE) IMPLANT
NEEDLE HYPO 25X5/8 SAFETYGLIDE (NEEDLE) IMPLANT
NS IRRIG 1000ML POUR BTL (IV SOLUTION) ×3 IMPLANT
PACK C SECTION WH (CUSTOM PROCEDURE TRAY) ×3 IMPLANT
PAD OB MATERNITY 4.3X12.25 (PERSONAL CARE ITEMS) ×3 IMPLANT
PENCIL SMOKE EVAC W/HOLSTER (ELECTROSURGICAL) ×3 IMPLANT
RETAINER VISCERAL (MISCELLANEOUS) ×2 IMPLANT
RETRACTOR TRAXI PANNICULUS (MISCELLANEOUS) IMPLANT
RTRCTR C-SECT PINK 25CM LRG (MISCELLANEOUS) ×3 IMPLANT
STRIP CLOSURE SKIN 1/2X4 (GAUZE/BANDAGES/DRESSINGS) ×2 IMPLANT
SUT MNCRL 0 VIOLET CTX 36 (SUTURE) ×2 IMPLANT
SUT MONOCRYL 0 CTX 36 (SUTURE) ×6
SUT PLAIN 0 NONE (SUTURE) IMPLANT
SUT PLAIN 2 0 (SUTURE) ×3
SUT PLAIN ABS 2-0 CT1 27XMFL (SUTURE) ×1 IMPLANT
SUT VIC AB 0 CTX 36 (SUTURE) ×6
SUT VIC AB 0 CTX36XBRD ANBCTRL (SUTURE) ×2 IMPLANT
SUT VIC AB 2-0 CT1 27 (SUTURE) ×3
SUT VIC AB 2-0 CT1 TAPERPNT 27 (SUTURE) ×1 IMPLANT
SUT VICRYL 4-0 PS2 18IN ABS (SUTURE) ×3 IMPLANT
TOWEL OR 17X24 6PK STRL BLUE (TOWEL DISPOSABLE) ×3 IMPLANT
TRAXI PANNICULUS EXTENDER (MISCELLANEOUS) ×2
TRAXI PANNICULUS RETRACTOR (MISCELLANEOUS) ×2
TRAY FOLEY W/BAG SLVR 14FR LF (SET/KITS/TRAYS/PACK) ×3 IMPLANT
WATER STERILE IRR 1000ML POUR (IV SOLUTION) ×3 IMPLANT

## 2019-08-29 SURGICAL SUPPLY — 23 items
ADH SKN CLS APL DERMABOND .7 (GAUZE/BANDAGES/DRESSINGS) ×1
BARRIER ADHS 3X4 INTERCEED (GAUZE/BANDAGES/DRESSINGS) ×2 IMPLANT
BRR ADH 4X3 ABS CNTRL BYND (GAUZE/BANDAGES/DRESSINGS) ×1
CATH FOLEY LATEX FREE 14FR (CATHETERS) ×3
CATH FOLEY LF 14FR (CATHETERS) IMPLANT
DERMABOND ADVANCED (GAUZE/BANDAGES/DRESSINGS) ×2
DERMABOND ADVANCED .7 DNX12 (GAUZE/BANDAGES/DRESSINGS) IMPLANT
DRESSING PREVENA PLUS CUSTOM (GAUZE/BANDAGES/DRESSINGS) IMPLANT
DRSG PREVENA PLUS CUSTOM (GAUZE/BANDAGES/DRESSINGS) ×3
EXTENDER TRAXI PANNICULUS (MISCELLANEOUS) IMPLANT
KIT PREVENA INCISION MGT20CM45 (CANNISTER) ×2 IMPLANT
RETRACTOR TRAXI PANNICULUS (MISCELLANEOUS) IMPLANT
SUT CHROMIC 1 CTX 36 (SUTURE) ×2 IMPLANT
SUT CHROMIC 2 0 CT 1 (SUTURE) ×2 IMPLANT
SUT PDS AB 0 CTX 60 (SUTURE) ×2 IMPLANT
SUT VIC AB 2-0 CT1 27 (SUTURE) ×3
SUT VIC AB 2-0 CT1 TAPERPNT 27 (SUTURE) IMPLANT
SUT VIC AB 4-0 KS 27 (SUTURE) ×2 IMPLANT
SUT VIC AB 4-0 PS2 27 (SUTURE) ×2 IMPLANT
SYR 50ML LL SCALE MARK (SYRINGE) ×2 IMPLANT
TRAXI PANNICULUS EXTENDER (MISCELLANEOUS) ×2
TRAXI PANNICULUS RETRACTOR (MISCELLANEOUS) ×2
WATER STERILE IRR 1000ML POUR (IV SOLUTION) ×4 IMPLANT

## 2019-08-29 NOTE — Transfer of Care (Signed)
Immediate Anesthesia Transfer of Care Note  Patient: Marilyn Cooper  Procedure(s) Performed: CYSTOSCOPY (N/A ) EXPLORATORY LAPAROTOMY (N/A ) BLADDER REPAIR (N/A )  Patient Location: PACU  Anesthesia Type:Epidural  Level of Consciousness: awake  Airway & Oxygen Therapy: Patient Spontanous Breathing  Post-op Assessment: Report given to RN and Post -op Vital signs reviewed and stable  Post vital signs: Reviewed and stable  Last Vitals:  Vitals Value Taken Time  BP 114/62 08/29/19 2147  Temp    Pulse 88 08/29/19 2149  Resp 25 08/29/19 2149  SpO2 100 % 08/29/19 2149  Vitals shown include unvalidated device data.  Last Pain:  Vitals:   08/29/19 2135  TempSrc:   PainSc: (P) 0-No pain      Patients Stated Pain Goal: 6 (08/29/19 1107)  Complications: No complications documented.

## 2019-08-29 NOTE — Op Note (Addendum)
Pre-Operative Diagnosis: 1) chronic hypertension with superimposed preeclampsia with severe features 2) 34+5-week intrauterine pregnancy 3) history of prior cesarean section x2 4) pregestational diabetes mellitus 5) maternal morbid obesity Postoperative Diagnosis: 1) chronic hypertension with superimposed preeclampsia with severe features 2) 34+5-week intrauterine pregnancy 3) history of prior cesarean section x2 4) pregestational diabetes mellitus 5) maternal morbid obesity Procedure: Repeat low transverse cesarean section Surgeon: Dr. Waynard Reeds Assistant: None Operative Findings: Vigorous female infant in the vertex presentation with Apgar scores of 8 at 1 minute and 8 at 5 minutes, birth weight 6 pounds 4.5 ounces, 2850 g.  Baby was transferred to the NICU due to gestational age.  Limited visualization of the adnexa bilaterally due to difficulty with containing bowel contents to the abdominal cavity. Specimen: Placenta to pathology EBL: 512 cc   Procedure:[name] is an 29 year old gravida 6 para 1132 at 47 weeks and 5 days estimated gestational age who presents for cesarean section.  The patient's pregnancy has been complicated by chronic hypertension.  She had a recent antenatal admission for chronic hypertension and was diagnosed with superimposed preeclampsia based on 24-hour urine of 800 mg.  Blood pressures were adequately controlled with labetalol 600 mg twice daily and Procardia 90 mg XL daily.  Patient presented on the morning of delivery with a severe headache that would not resolve with Tylenol.  A IV headache cocktail in the triage unit would not resolve her headache and she was seeing visual spots.  Given the new onset of worsening headaches that could not be controlled with medication and visual scotoma the decision was made to proceed with repeat cesarean delivery for superimposed preeclampsia with severe features.  Following the appropriate informed consent the patient was brought  to the operating room where combined spinal/epidural anesthesia was administered and found to be adequate. She was placed in the dorsal supine position with a leftward tilt.  Due to significant pannus the track see panniculus retractor was used with the extension to retract the pannus to allow visualization of the surgical site.  She was prepped and draped in the normal sterile fashion. The patient was appropriately identified during a preoperative timeout procedure.  The scalpel was then used to make a Pfannenstiel skin incision which was carried down through the underlying soft tissue to the level of the fascia the fascia was identified, entered sharply with the scalpel and the fascial incision was extended laterally with Mayo scissors.  Kocher clamps x2 were used to grasp the anterior portion of the fascia.  This was tented up and the underlying rectus muscle was dissected off with the electrocautery unit.  The same procedure was repeated on the inferior aspect of the fascial incision.  The midline of the rectus muscle was identified and separated with both sharp and blunt dissection.  The abdominal peritoneum was identified, tented up with hemostats x2 and entered sharply.  The incision in the peritoneum was extended with both sharp and blunt dissection.  The Alexis retractor was deployed. The uterus was noted to be extremely right tilted.  The patient was air planed to the left side and the uterus was pushed toward the patient's left side.  The vesicouterine peritoneum was identified entered sharply and a bladder flap was created digitally.  A scalpel was then used to make a low transverse incision on the uterus which was extended laterally with blunt dissection.  The fetal vertex was identified tented up delivered easily through the uterine incision with a loose nuchal cord x1 followed  by the body.  The infant cried vigorously on the operative field the cord was clamped and cut and the infant was passed to the  waiting NICU team after a 1 minute delay in cord clamping.  At the time of delivery of the infant the uterus and bowels spontaneously delivered through the uterine incision.  The placenta was delivered spontaneously and the uterus was cleared of all clot and debris attempts were made to replace the bowel contents and the uterus into the abdominal cavity however intrauterine pressure precluded this.  A wet lap was used to pack away the bowels back into the abdominal cavity somewhat.  The uterine incision was repaired with #1 chromic in a running unlocked fashion followed by a second imbricating layer.  Again attempts were made to replace the bowels into the abdominal cavity which was difficult.  This was achieved with a combination of IV fentanyl for pain control for the patient moist laparotomy sponges and the bowels were packed back into the abdominal cavity.  The uterus was replaced in the abdominal cavity.  The peritoneum was identified and grasped with Kelly clamps the peritoneum was then closed in a running fashion with the assistant of a wide malleable and a fish retractor to help to contain the bowels to the abdomen.  The peritoneum was successfully were repaired with 2-0 Vicryl.  The rectus muscles were then reapproximated with 2-0 chromic in a running fashion.  At the beginning of the repair approximation of the rectus muscles the CRNA noted a small amount of blood in the patient's Foley catheter.  No further mention was made.  The fascia was closed with a looped PDS in a running fashion the subcutaneous tissue was repaired with 2-0 plain gut interrupted sutures the skin was closed with 4-0 Mellody Dance in a subcuticular fashion and Dermabond and a Prevnar wound VAC was placed on the incision.  All sponge lap needle counts were correct.  Following closure more blood tinged urine was noted in the patient's Foley.  The patient was transferred to the PACU in stable condition following the procedure.  scalpel was  then used to make a Pfannenstiel skin incision which was carried down to the underlying layers of soft tissue to the fascia. The fascia was incised in the midline and the fascial incision was extended laterally with Mayo scissors. The superior aspect of the fascial incision was grasped with Coker clamps x2, tented up and the rectus muscles dissected off sharply with the electrocautery unit area and the same procedure was repeated on the inferior aspect of the fascial incision. The rectus muscles were separated in the midline. The abdominal peritoneum was identified, tented up, entered sharply, and the incision was extended superiorly and inferiorly with good visualization of the bladder. The Alexis retractor was then deployed. The vesicouterine peritoneum was identified, tented up, entered sharply, and the bladder flap was created digitally. Scalpel was then used to make a low transverse incision on the uterus which was extended laterally with both blunt dissection and the bandage scissors. The fetal vertex was identified, delivered easily through the uterine incision followed by the body. The infant was bulb suctioned on the operative field cried vigorously, cord was clamped and cut and the infant was passed to the waiting neonatologist. Placenta was then delivered spontaneously, the uterus was cleared of all clot and debris. The uterine incision was repaired with #1 chromic in running locked fashion followed by a second imbricating layer. Ovaries and tubes were inspected and normal.  The Alexis retractor was removed. The uterus was returned to the abdominal cavity the abdominal cavity was cleared of all clot and debris. The abdominal peritoneum was reapproximated with 2-0 Vicryl in a running fashion, the rectus muscles was reapproximated with #1 chromic in a running fashion. The fascia was closed with a looped PDS in a running fashion. The skin was closed with 4-0 vicryl in a subcuticular fashion and Dermabon. All  sponge lap and needle counts were correct x2. Patient tolerated the procedure well and recovered in stable condition following the procedure.

## 2019-08-29 NOTE — MAU Provider Note (Signed)
Chief Complaint:  Hypertension, Headache, and visual changes   First Provider Initiated Contact with Patient 08/29/19 1204     HPI: Marilyn Cooper is a 29 y.o. [id number] at [redacted]w[redacted]d who presents to maternity admissions reporting headache & visual disturbance. Pt has chronic hypertension & was diagnosed with superimposed preeclampsia earlier this month. BP meds were changed to labetalol 300 mg BID & procardia xl 90 mg daily. Took last dose of BP meds this morning. Reports severe headache since this morning. Rates h/a at 10/10. Is also seeing silver specks in her vision with headache. Took 2 ES tylenol this morning without relief. Reports her BP this morning was 160s/100s. No epigastric pain.  Location: head Quality: throbbing Severity: 10/10 in pain scale Duration: 4 hours Timing: constant Modifying factors: nothing makes better or worse. Not improved with tylenol Associated signs and symptoms: visual disturbance   Pregnancy Course: [employer]. CHTN w/si preeclampsia. GDM. Hx c/section x 2.   Past Medical History:  Diagnosis Date  . Anemia   . Anxiety   . Asthma    childhood  . Chronic hypertension   . Depression    see social work consult  . Gestational diabetes   . Hx of varicella   . Obese    OB History  Gravida Para Term Preterm AB Living  6 2 1 1 3 2   SAB TAB Ectopic Multiple Live Births    3   0 2    # Outcome Date GA Lbr Len/2nd Weight Sex Delivery Anes PTL Lv  6 Current           5 Preterm 12/03/14 [redacted]w[redacted]d  3620 g F CS-LTranv EPI, Spinal  LIV     Birth Comments: Preeclampsia, mother treated with mag after birth  4 Term 03/09/12 [redacted]w[redacted]d   M CS-LTranv EPI  LIV  3 TAB 2013          2 TAB 2012          1 TAB 2011           Past Surgical History:  Procedure Laterality Date  . CESAREAN SECTION N/A 03/09/2012   Procedure: CESAREAN SECTION;  Surgeon: 03/11/2012. Freddrick March, MD;  Location: [facility];  Service: Obstetrics;  Laterality: N/A;  . CESAREAN SECTION N/A 12/03/2014    Procedure: CESAREAN SECTION;  Surgeon: 12/05/2014, MD;  Location: [facility];  Service: Obstetrics;  Laterality: N/A;  . COLPOSCOPY    . HERNIA REPAIR    . TONSILLECTOMY    . UMBILICAL HERNIA REPAIR N/A 06/21/2014   Procedure: EXPLORATORY LAPAROTOMY,  SMALL BOWEL RESECTION, PRIMARY REPAIR INCARCERATED VENTRAL HERNIA;  Surgeon: 08/21/2014, MD;  Location: [facility];  Service: General;  Laterality: N/A;   Family History  Problem Relation Age of Onset  . Anemia Mother   . Anemia Sister   . Birth defects Brother        heart disorder?  [place] Level Hypertension Maternal Grandmother   . Diabetes Maternal Grandmother   . Hypertension Maternal Grandfather   . Diabetes Maternal Grandfather   . Hypertension Paternal Grandmother   . Hypertension Paternal Grandfather   . Diabetes Father    Social History   Tobacco Use  . Smoking status: Never Smoker  . Smokeless tobacco: Never Used  Vaping Use  . Vaping Use: Never used  Substance Use Topics  . Alcohol use: No  . Drug use: No   No Known Allergies Medications Prior to Admission  Medication Sig Dispense Refill Last  Dose  . famotidine (PEPCID) 10 MG tablet Take 10 mg by mouth 2 (two) times daily.     Marland Kitchen ACCU-CHEK GUIDE test strip      . Accu-Chek Softclix Lancets lancets as directed.     Marland Kitchen aspirin EC 81 MG tablet Take 81 mg by mouth daily. Swallow whole.     . cyclobenzaprine (FLEXERIL) 10 MG tablet Take 1 tablet (10 mg total) by mouth 3 (three) times daily as needed for muscle spasms. 30 tablet 0   . diphenhydrAMINE (BENADRYL) 25 MG tablet Take 25 mg by mouth 2 (two) times daily.     . IRON PO Take 325 mg by mouth 2 (two) times daily.      Marland Kitchen labetalol (NORMODYNE) 300 MG tablet Take 2 tablets (600 mg total) by mouth 2 (two) times daily for 25 days. 100 tablet 0   . metFORMIN (GLUCOPHAGE) 500 MG tablet Take 2 tablets (1,000 mg total) by mouth in the morning and at bedtime for 25 days. 500mg  in the morning 1000mg  at night 100 tablet 0   . NIFEdipine  (PROCARDIA XL/NIFEDICAL-XL) 90 MG 24 hr tablet Take 90 mg by mouth daily.     . Prenatal Vit-Fe Fumarate-FA (PRENATAL VITAMIN PO) Take 1 tablet by mouth daily.        I have reviewed patient's Past Medical Hx, Surgical Hx, Family Hx, Social Hx, medications and allergies.   ROS:  Review of Systems  Constitutional: Negative.   Eyes: Positive for visual disturbance.  Gastrointestinal: Negative.   Genitourinary: Negative.   Neurological: Positive for dizziness and headaches.    Physical Exam   Patient Vitals for the past 24 hrs:  BP Temp Temp src Pulse Resp SpO2 Height Weight  08/29/19 1330 132/75 -- -- 78 -- -- -- --  08/29/19 1320 137/78 -- -- 91 -- -- -- --  08/29/19 1300 125/62 -- -- 88 -- -- -- --  08/29/19 1230 (!) 141/75 -- -- 91 -- -- -- --  08/29/19 1215 (!) 141/71 -- -- 93 -- -- -- --  08/29/19 1200 137/60 -- -- 95 -- -- -- --  08/29/19 1145 129/80 -- -- 92 -- -- -- --  08/29/19 1130 (!) 144/96 -- -- 92 -- -- -- --  08/29/19 1115 (!) 153/76 -- -- 89 -- -- -- --  08/29/19 1100 (!) 142/78 -- -- 91 -- -- -- --  08/29/19 1055 (!) 151/80 -- -- 96 -- -- -- --  08/29/19 1028 (!) 149/80 98.4 F (36.9 C) Oral 91 20 100 % 5' 1.5" (1.562 m) 136.1 kg    Constitutional: Well-developed, well-nourished female in no acute distress.  Cardiovascular: normal rate & rhythm, no murmur Respiratory: normal effort, lung sounds clear throughout GI: Abd soft, non-tender, gravid appropriate for gestational age. Pos BS x 4 MS: Extremities nontender, no edema, normal ROM Neurologic: Alert and oriented x 4.   Fetal Tracing:  Baseline: 135 Variability: moderate Accelerations: 15x15 Decelerations: none  Toco: none    Labs: Results for orders placed or performed during the hospital encounter of 08/29/19 (from the past 24 hour(s))  Urinalysis, Routine w reflex microscopic Urine, Clean Catch     Status: Abnormal   Collection Time: 08/29/19 10:33 AM  Result Value Ref Range   Color, Urine  YELLOW YELLOW   APPearance HAZY (A) CLEAR   Specific Gravity, Urine 1.025 1.005 - 1.030   pH 6.0 5.0 - 8.0   Glucose, UA NEGATIVE NEGATIVE mg/dL   Hgb urine  dipstick SMALL (A) NEGATIVE   Bilirubin Urine NEGATIVE NEGATIVE   Ketones, ur 5 (A) NEGATIVE mg/dL   Protein, ur 30 (A) NEGATIVE mg/dL   Nitrite NEGATIVE NEGATIVE   Leukocytes,Ua TRACE (A) NEGATIVE   RBC / HPF 0-5 0 - 5 RBC/hpf   WBC, UA 0-5 0 - 5 WBC/hpf   Bacteria, UA RARE (A) NONE SEEN   Squamous Epithelial / LPF 6-10 0 - 5   Mucus PRESENT    Hyaline Casts, UA PRESENT   Protein / creatinine ratio, urine     Status: Abnormal   Collection Time: 08/29/19 10:33 AM  Result Value Ref Range   Creatinine, Urine 173.32 mg/dL   Total Protein, Urine 31 mg/dL   Protein Creatinine Ratio 0.18 (H) 0.00 - 0.15 mg/mg[Cre]  CBC     Status: Abnormal   Collection Time: 08/29/19 12:16 PM  Result Value Ref Range   WBC 8.7 4.0 - 10.5 K/uL   RBC 3.53 (L) 3.87 - 5.11 MIL/uL   Hemoglobin 8.9 (L) 12.0 - 15.0 g/dL   HCT 33.0 (L) 36 - 46 %   MCV 79.0 (L) 80.0 - 100.0 fL   MCH 25.2 (L) 26.0 - 34.0 pg   MCHC 31.9 30.0 - 36.0 g/dL   RDW 07.6 22.6 - 33.3 %   Platelets 263 150 - 400 K/uL   nRBC 0.0 0.0 - 0.2 %  Comprehensive metabolic panel     Status: Abnormal   Collection Time: 08/29/19 12:16 PM  Result Value Ref Range   Sodium 136 135 - 145 mmol/L   Potassium 4.1 3.5 - 5.1 mmol/L   Chloride 105 98 - 111 mmol/L   CO2 20 (L) 22 - 32 mmol/L   Glucose, Bld 107 (H) 70 - 99 mg/dL   BUN 6 6 - 20 mg/dL   Creatinine, Ser 5.45 0.44 - 1.00 mg/dL   Calcium 9.5 8.9 - 62.5 mg/dL   Total Protein 6.4 (L) 6.5 - 8.1 g/dL   Albumin 2.7 (L) 3.5 - 5.0 g/dL   AST 11 (L) 15 - 41 U/L   ALT 11 0 - 44 U/L   Alkaline Phosphatase 76 38 - 126 U/L   Total Bilirubin 0.2 (L) 0.3 - 1.2 mg/dL   GFR calc non Af Amer >60 >60 mL/min   GFR calc Af Amer >60 >60 mL/min   Anion gap 11 5 - 15    Imaging:  No results found.  MAU Course: Orders Placed This Encounter   Procedures  . SARS Coronavirus 2 by RT PCR (hospital order, performed in [facility] hospital lab) Nasopharyngeal Nasopharyngeal Swab  . Urinalysis, Routine w reflex microscopic Urine, Clean Catch  . CBC  . Comprehensive metabolic panel  . Protein / creatinine ratio, urine  . Diet NPO time specified  . Insert peripheral IV  . Fall precautions   Meds ordered this encounter  Medications  . AND Linked Order Group   . diphenhydrAMINE (BENADRYL) injection 25 mg   . metoCLOPramide (REGLAN) injection 10 mg   . dexamethasone (DECADRON) injection 10 mg   . lactated ringers bolus 1,000 mL    MDM: Known CHTN with recently diagnosed superimposed preeclampsia. New onset headache this morning. No severe range bps in MAU. Headache treated with IV headache cocktail (decadron, benadryl, & reglan) since patient already failed tylenol. Pt reports headache down to 9/10 from 10/10.  Labs stable.   Reviewed with Dr. Donavan Foil who recommends admission.  Notified Dr. Tenny Craw of presentation. Plans for repeat  c/section today.  Pt reports she has been NPO since last night at 10 pm.   Assessment: 1. Chronic hypertension with superimposed preeclampsia     Plan: Plan for delivery today Covid swab pending  Marilyn Cooper, Marilyn Yott, NP 08/29/2019 2:16 PM

## 2019-08-29 NOTE — Anesthesia Preprocedure Evaluation (Signed)
Anesthesia Evaluation  Patient identified by MRN, date of birth, ID band Patient awake    Reviewed: Allergy & Precautions, NPO status , Patient's Chart, lab work & pertinent test results  Airway Mallampati: III  TM Distance: >3 FB Neck ROM: Full    Dental no notable dental hx.    Pulmonary asthma ,    Pulmonary exam normal breath sounds clear to auscultation       Cardiovascular hypertension (cHTN with superimposed preE), Normal cardiovascular exam Rhythm:Regular Rate:Normal     Neuro/Psych PSYCHIATRIC DISORDERS Anxiety Depression negative neurological ROS     GI/Hepatic negative GI ROS, Neg liver ROS,   Endo/Other  diabetes, GestationalMorbid obesity (BMI 56)  Renal/GU negative Renal ROS  negative genitourinary   Musculoskeletal negative musculoskeletal ROS (+)   Abdominal   Peds  Hematology  (+) Blood dyscrasia (recheck Hgb 8.2), anemia ,   Anesthesia Other Findings Pt with gross hematuria after scheduled C/S earlier today. CSE placed without complication for C/S.   Reproductive/Obstetrics                             Anesthesia Physical Anesthesia Plan  ASA: III  Anesthesia Plan: Combined Spinal and Epidural   Post-op Pain Management:    Induction:   PONV Risk Score and Plan: Treatment may vary due to age or medical condition  Airway Management Planned: Natural Airway  Additional Equipment:   Intra-op Plan:   Post-operative Plan:   Informed Consent: I have reviewed the patients History and Physical, chart, labs and discussed the procedure including the risks, benefits and alternatives for the proposed anesthesia with the patient or authorized representative who has indicated his/her understanding and acceptance.       Plan Discussed with: Anesthesiologist  Anesthesia Plan Comments: (Plan to use pre-existing epidural catheter left in place after C/S.  )         Anesthesia Quick Evaluation

## 2019-08-29 NOTE — Anesthesia Procedure Notes (Deleted)
Spinal  Patient location during procedure: OR Staffing Performed: anesthesiologist  Anesthesiologist: Floyd Lusignan E, MD Preanesthetic Checklist Completed: patient identified, IV checked, risks and benefits discussed, surgical consent, monitors and equipment checked, pre-op evaluation and timeout performed Spinal Block Patient position: sitting Prep: DuraPrep and site prepped and draped Patient monitoring: continuous pulse ox, blood pressure and heart rate Approach: midline Location: L3-4 Injection technique: single-shot Needle Needle type: Pencan  Needle gauge: 24 G Needle length: 9 cm Additional Notes Functioning IV was confirmed and monitors were applied. Sterile prep and drape, including hand hygiene and sterile gloves were used. The patient was positioned and the spine was prepped. The skin was anesthetized with lidocaine.  Free flow of clear CSF was obtained prior to injecting local anesthetic into the CSF. The needle was carefully withdrawn. The patient tolerated the procedure well.      

## 2019-08-29 NOTE — Anesthesia Procedure Notes (Signed)
Spinal  Patient location during procedure: OR Staffing Performed: anesthesiologist  Anesthesiologist: Lucretia Kern, MD Preanesthetic Checklist Completed: patient identified, IV checked, risks and benefits discussed, surgical consent, monitors and equipment checked, pre-op evaluation and timeout performed Spinal Block Patient position: sitting Prep: DuraPrep Patient monitoring: continuous pulse ox, blood pressure and heart rate Approach: midline Location: L3-4 Injection technique: catheter Needle Needle type: Pencan and Tuohy  Needle gauge: 25 G Needle length: 12.7 cm Catheter type: closed end flexible Catheter size: 19 g Additional Notes The patient was prepped and draped in the usual sterile fashion. A combined spinal-epidural technique was performed with a 9 cm 17g Tuohy needle and loss of resistance technique. After encountering LOR at 9 cm, a 12.7 cm 25g Pencan spinal needle was introduced via the Tuohy and clear CSF was aspirated prior to injection of local anesthetic. The spinal needle was removed and a 19 g flexible epidural catheter placed prior to Tuohy removal. Patient tolerated the procedure well without complications.

## 2019-08-29 NOTE — MAU Note (Signed)
Started with HA at 0645, no relief  With Tylenol. Then started having pain in low back, constant.  Getting pains on rt side(upper and lower), comes and goes.  Vision was right, wasn't blurring, now seeing silver specks. Denies increase in swelling.  BP at home was really high.

## 2019-08-29 NOTE — Progress Notes (Signed)
Patient ID: Marilyn Cooper, female   DOB: 1990-05-19, 29 y.o.   MRN: [id number]  Intraoperatively a small amount of blood was noted within the foley tubing at the time of the peritoneal closure. After the skin was closed the urine was more frankly blooding and did not appear to be clearing. At the time of peritoneal closure the bladder was felt to be well away from the suture site , however peritoneal closure was difficult due to the bowels continuing to try to escape the abdominal cavity. Given persistant concern for unrecognized blader injury. Will plan to take patient back to OR and attempt a cystoscopy. If suture is noted in the bladder then will re-open patient and remove and repair. R/B/A of the procedure were discussed with the patient and she wishes to proceed

## 2019-08-29 NOTE — H&P (Addendum)
Marilyn Cooper is a 29 y.o. female presenting for severe headache  29 year old gravida 6 para 1-1-3-2 at 49 and five presents MAU complaining of a severe headache 10 out of 10 not resolved with 1 g of Tylenol this morning.  The patient's pregnancy has been complicated by chronic hypertension with superimposed preeclampsia that was diagnosed at 33 and 5 weeks.  She has been on Procardia 90 mg XL and labetalol 600 mg twice daily for blood pressure control.  She is also a gestational diabetic and she is on Metformin 1000 mg twice daily.  She has morbid obesity with a BMI of 55 . She received betamethasone on August 3 and August 4 during an antepartum admission.  Per maternal-fetal medicine note from her prior admission the plan was delivery at 37, however if she developed severe features of preeclampsia based on worsening blood pressure control requiring increased amounts of antihypertensive medications, abnormal labs, or symptoms that she should be delivered after 34 weeks.  Given the severe headache not relieved by Tylenol or the IV headache cocktail in maternity admissions and visual scotoma the patient needs severe criteria based on symptoms and we will proceed with repeat cesarean delivery.  Risks/benefits/alternatives of the procedure were discussed with the patient at length.  N.p.o. since 10 PM last night OB History    Gravida  6   Para  2   Term  1   Preterm  1   AB  3   Living  2     SAB      TAB  3   Ectopic      Multiple  0   Live Births  2          Past Medical History:  Diagnosis Date  . Anemia   . Anxiety   . Asthma    childhood  . Chronic hypertension   . Depression    see social work consult  . Gestational diabetes   . Hx of varicella   . Obese    Past Surgical History:  Procedure Laterality Date  . CESAREAN SECTION N/A 03/09/2012   Procedure: CESAREAN SECTION;  Surgeon: Freddrick March. Tenny Craw, MD;  Location: [facility];  Service: Obstetrics;  Laterality: N/A;  .  CESAREAN SECTION N/A 12/03/2014   Procedure: CESAREAN SECTION;  Surgeon: Marlow Baars, MD;  Location: [facility];  Service: Obstetrics;  Laterality: N/A;  . COLPOSCOPY    . HERNIA REPAIR    . TONSILLECTOMY    . UMBILICAL HERNIA REPAIR N/A 06/21/2014   Procedure: EXPLORATORY LAPAROTOMY,  SMALL BOWEL RESECTION, PRIMARY REPAIR INCARCERATED VENTRAL HERNIA;  Surgeon: Darnell Level, MD;  Location: [facility];  Service: General;  Laterality: N/A;   Family History: family history includes Anemia in her mother and sister; Birth defects in her brother; Diabetes in her father, maternal grandfather, and maternal grandmother; Hypertension in her maternal grandfather, maternal grandmother, paternal grandfather, and paternal grandmother. Social History:  reports that she has never smoked. She has never used smokeless tobacco. She reports that she does not drink alcohol and does not use drugs.     Maternal Diabetes: Yes:  Diabetes Type:  Insulin/Medication controlled Metformin 1000 mg twice daily Genetic Screening: Declined Maternal Ultrasounds/Referrals: Normal Fetal Ultrasounds or other Referrals:  Referred to Materal Fetal Medicine , fetal echo reported as normal. Last [place] @ [facility] 7/22 1966gm ,62%, AFI 11.3 cm Maternal Substance Abuse:  No Significant Maternal Medications:  Meds include: Other: Procardia 90 mg XL, labetalol 600 mg twice  daily, Metformin 1000 mg twice daily Significant Maternal Lab Results:  Group B Strep positive Other Comments:  None  Review of Systems History   Blood pressure 132/75, pulse 78, temperature 98.4 F (36.9 C), temperature source Oral, resp. rate 20, height 5' 1.5" (1.562 m), weight 136.1 kg, last menstrual period 12/29/2018, SpO2 100 %. Exam Physical Exam  Prenatal labs: ABO, Rh: --/--/A POS (08/02 2037) Antibody: NEG (08/02 2037) Rubella:   Immune RPR:   Nonreactive HBsAg:   Negative HIV:    Negative GBS:   Positive for urine culture  Assessment/Plan: 1) Admit for repeat  cesarean section  2) hemoglobin 8.9 g, will type and cross for 2 units 3) magnesium sulfate for seizure prophylaxis 4) continue oral antihypertensive medications 5) we will likely need to decrease patient's Metformin in half.  Will start with CBGs, last blood sugar 107 at 1216 6) SCDs for DVT prophylaxis, will start prophylactic lovenox 12 hrs PP 7) Ancef 3 g on-call to the OR  Waynard Reeds 08/29/2019, 1:59 PM

## 2019-08-29 NOTE — Anesthesia Preprocedure Evaluation (Signed)
Anesthesia Evaluation  Patient identified by MRN, date of birth, ID band Patient awake    Reviewed: Allergy & Precautions, H&P , NPO status , Patient's Chart, lab work & pertinent test results  History of Anesthesia Complications Negative for: history of anesthetic complications  Airway Mallampati: II  TM Distance: >3 FB Neck ROM: full    Dental no notable dental hx.    Pulmonary neg pulmonary ROS,    Pulmonary exam normal        Cardiovascular hypertension, Normal cardiovascular exam     Neuro/Psych negative neurological ROS  negative psych ROS   GI/Hepatic negative GI ROS, Neg liver ROS,   Endo/Other  diabetes, GestationalMorbid obesity  Renal/GU negative Renal ROS  negative genitourinary   Musculoskeletal   Abdominal   Peds  Hematology  (+) Blood dyscrasia, anemia ,   Anesthesia Other Findings  Pre-eclampsia w/ headache Hgb 8.9, plts 263 2 prior C/S  Reproductive/Obstetrics (+) Pregnancy                             Anesthesia Physical Anesthesia Plan  ASA: III and emergent  Anesthesia Plan: Spinal   Post-op Pain Management:    Induction:   PONV Risk Score and Plan: 3 and Ondansetron and Treatment may vary due to age or medical condition  Airway Management Planned:   Additional Equipment:   Intra-op Plan:   Post-operative Plan:   Informed Consent: I have reviewed the patients History and Physical, chart, labs and discussed the procedure including the risks, benefits and alternatives for the proposed anesthesia with the patient or authorized representative who has indicated his/her understanding and acceptance.       Plan Discussed with:   Anesthesia Plan Comments:         Anesthesia Quick Evaluation

## 2019-08-29 NOTE — Anesthesia Postprocedure Evaluation (Signed)
Anesthesia Post Note  Patient: Marilyn Cooper  Procedure(s) Performed: CESAREAN SECTION (N/A )     Patient location during evaluation: PACU Anesthesia Type: Spinal Level of consciousness: oriented and awake and alert Pain management: pain level controlled Vital Signs Assessment: post-procedure vital signs reviewed and stable Respiratory status: spontaneous breathing, respiratory function stable and patient connected to nasal cannula oxygen Cardiovascular status: blood pressure returned to baseline and stable Postop Assessment: no headache, no backache and no apparent nausea or vomiting Anesthetic complications: no Comments: Pt to return to OR for evaluation of increased hematuria c/f bladder injury.    No complications documented.  Last Vitals:  Vitals:   08/29/19 1910 08/29/19 1915  BP:  (!) 145/86  Pulse: 95 97  Resp: (!) 24 15  Temp:    SpO2: 98% 100%    Last Pain:  Vitals:   08/29/19 1910  TempSrc:   PainSc: 0-No pain   Pain Goal: Patients Stated Pain Goal: 6 (08/29/19 1107)                 Chanc Kervin L Aliani Caccavale

## 2019-08-29 NOTE — Op Note (Signed)
Pre-Operative Diagnosis: 1) hematuria 2) suspected bladder injury Postoperative Diagnosis: 1) hematuria 2) suture line from the uterine repair in the dome of the bladder Procedure: Cystoscopy, exploratory laparotomy, removal of suture from the dome of the bladder.  Imbrication of the bladder dome. Surgeon: Dr. Waynard Reeds Assistant: None Operative Findings: Cystoscopy was performed with a 30 degrees scope.  Prior to introducing the cystoscope into the bladder methylene blue was injected intravenously into the patient.  Bilateral ureteral orifices were identified.  Flow was identified from each orifice.  Further inspection of the bladder demonstrated several sutures in a linear fashion in the dome of the bladder.  The surgical case was discussed with Dr. Benancio Deeds of urology following the cystoscopy.  And it was recommended to remove the sutures from the dome of the bladder and then perform an imbricating layer over that suture line that was removed.  The bladder was then retrograde filled with sterile milk which did not demonstrate any spill of sterile milk into the abdomen. Specimen: none EBL: Total I/O In: 2000 [I.V.:2000] Out: 140 [Urine:100; Blood:40]   [name] is a 29 year old now G6 P1-2-3-3 who is immediately postop from a repeat low transverse cesarean section.  A small amount of blood was noted in the Foley catheter during closure of the abdominal cavity.  With transfer to the PACU the urine appeared more blood stained and it continued to be frankly bloody.  These findings were concerning for a intraoperative bladder injury from the cesarean section and the decision was made to bring the patient back to the operating room and perform a cystoscopy.  If bladder injury or suture was found within the bladder then we would perform an exploratory laparotomy using the patient's prior cesarean incision and repair the findings.  The patient's case prior to proceeding to the operating room was  discussed with Dr. Benancio Deeds of urology.    The patient was taken to the operating room where she was placed in the dorsal lithotomy position in Daniel stirrups.  Epidural anesthesia was confirmed to be adequate.  She was prepped and draped in the normal sterile fashion.  She was appropriately identified during a preoperative timeout procedure.  She received another 3 g of IV Ancef prior to initiating the second surgical procedure.  A 30 degree cystoscope was introduced into the urethra the bladder was distended.  Bilateral ureteral orifices were identified and urine flow jets were visualized from each ureteral orifice.  The bladder was then fully evaluated and at the dome of the bladder several sutures were noted to be penetrating into the bladder.  This was consistent with the suture line from part of the uterine repair.  The cystoscopic portion of the case was discontinued.  And attention was turned to the abdominal portion of the case.  Prior to proceeding with the abdominal portion of the case the case was again discussed with Dr. Benancio Deeds of urology.  The decision was made to proceed with removal of the suture line from the bladder and to imbricate a layer over the portion of the dome of the bladder where the sutures had penetrated.  The abdomen was then prepped and draped in a sterile fashion, the Dermabond was removed from the incision.  The subcuticular sutures were removed and the subcutaneous sutures were removed and the soft tissue was open to the layer of the fascia the fascial suture was removed.  The rectus muscle suture and abdominal peritoneum suture was removed.  At the time of  removal of the peritoneal suture bowel contents exuded from the incision.  The Alexis retractor was carefully deployed again and moist laparotomy sponges were used to pack the bowel up into the upper abdomen.  The patient was placed in a small amount of Trendelenburg.  The uterus was identified and elevated the suture line  that was penetrating the dome of the bladder was noted to be the second imbricating layer.  The sutures were carefully removed.  The uterine incision was still intact and hemostatic.  4-0 Vicryl was used to perform imbricating sutures in an interrupted fashion over the removed suture line at the dome of the bladder.  The bladder was then retrograde filled with sterile milk with no extravasation of milk into the abdominal cavity.  The bladder was then drained the abdominal peritoneum was repaired with 2-0 Vicryl in a running fashion after the laparotomy sponges were removed and the Alexis retractor was removed.  The rectus muscles were then reapproximated with 2-0 chromic.  The fascia was closed with a looped PDS in a running locked fashion.  The subcutaneous tissue was reapproximated with 2-0 plain gut interrupted sutures.  The skin was reapproximated with 4-0 Vicryl suture on a Keith needle in a subcuticular fashion and Dermabond was applied.  The Prevnar wound VAC was again reapplied to the incision.  We will plan to keep the patient's Foley catheter in situ for 7 days.  All sponge, lap, needle counts were correct.  The patient tolerated the procedure well as was transferred to the PACU in stable condition following the procedure.

## 2019-08-29 NOTE — Transfer of Care (Signed)
Immediate Anesthesia Transfer of Care Note  Patient: Marilyn Cooper  Procedure(s) Performed: CESAREAN SECTION (N/A )  Patient Location: PACU  Anesthesia Type:Spinal  Level of Consciousness: awake  Airway & Oxygen Therapy: Patient Spontanous Breathing  Post-op Assessment: Report given to RN  Post vital signs: Reviewed and stable  Last Vitals:  Vitals Value Taken Time  BP    Temp    Pulse    Resp    SpO2      Last Pain:  Vitals:   08/29/19 1329  TempSrc:   PainSc: 9       Patients Stated Pain Goal: 6 (08/29/19 1107)  Complications: No complications documented.

## 2019-08-30 ENCOUNTER — Ambulatory Visit: Payer: Medicaid Other

## 2019-08-30 ENCOUNTER — Encounter (HOSPITAL_COMMUNITY): Payer: Self-pay | Admitting: Obstetrics and Gynecology

## 2019-08-30 LAB — GLUCOSE, CAPILLARY
Glucose-Capillary: 119 mg/dL — ABNORMAL HIGH (ref 70–99)
Glucose-Capillary: 99 mg/dL (ref 70–99)
Glucose-Capillary: 91 mg/dL (ref 70–99)

## 2019-08-30 LAB — CREATININE, SERUM
Creatinine, Ser: 0.67 mg/dL (ref 0.44–1.00)
GFR calc Af Amer: >60 mL/min (ref >60)
GFR calc non Af Amer: >60 mL/min (ref >60)

## 2019-08-30 LAB — CBC
HCT: 22.1 % — ABNORMAL LOW (ref 36.0–46.0)
HCT: 21.2 % — ABNORMAL LOW (ref 36.0–46.0)
Hemoglobin: 7 g/dL — ABNORMAL LOW (ref 12.0–15.0)
Hemoglobin: 6.7 g/dL — CL (ref 12.0–15.0)
MCH: 25.4 pg — ABNORMAL LOW (ref 26.0–34.0)
MCH: 25.7 pg — ABNORMAL LOW (ref 26.0–34.0)
MCHC: 31.7 g/dL (ref 30.0–36.0)
MCHC: 31.6 g/dL (ref 30.0–36.0)
MCV: 80.1 fL (ref 80.0–100.0)
MCV: 81.2 fL (ref 80.0–100.0)
Platelets: 277 10*3/uL (ref 150–400)
Platelets: 272 10*3/uL (ref 150–400)
RBC: 2.76 MIL/uL — ABNORMAL LOW (ref 3.87–5.11)
RBC: 2.61 MIL/uL — ABNORMAL LOW (ref 3.87–5.11)
RDW: 13.9 % (ref 11.5–15.5)
RDW: 14.2 % (ref 11.5–15.5)
WBC: 11.1 10*3/uL — ABNORMAL HIGH (ref 4.0–10.5)
WBC: 7.6 10*3/uL (ref 4.0–10.5)
nRBC: 0 % (ref 0.0–0.2)
nRBC: 0 % (ref 0.0–0.2)

## 2019-08-30 LAB — RPR: RPR Ser Ql: NONREACTIVE

## 2019-08-30 MED ORDER — FERROUS SULFATE 325 (65 FE) MG PO TABS
325.0000 mg | ORAL_TABLET | Freq: Two times a day (BID) | ORAL | Status: DC
  Administered 2019-08-30 – 2019-09-01 (×4): 325 mg via ORAL
  Filled 2019-08-30 (×4): qty 1

## 2019-08-30 MED ORDER — OXYCODONE HCL 5 MG PO TABS
5.0000 mg | ORAL_TABLET | ORAL | Status: DC | PRN
  Administered 2019-08-30: 5 mg via ORAL
  Administered 2019-08-30 – 2019-09-01 (×7): 10 mg via ORAL
  Administered 2019-09-01: 5 mg via ORAL
  Administered 2019-09-01: 10 mg via ORAL
  Filled 2019-08-30 (×4): qty 2
  Filled 2019-08-30: qty 1
  Filled 2019-08-30 (×6): qty 2

## 2019-08-30 NOTE — Progress Notes (Signed)
POD#1 Pt has no c/o. No headaches, Tolerating diet well. Wants to ambulate but is on MgSO4. Urine is clear, B/Ps normal. BSs normal.No Flatus yet. Abd- obese, soft nontender IMP/stable Plan/Will reassess after pt on MgSO4 for 24 hours.

## 2019-08-30 NOTE — Lactation Note (Signed)
This note was copied from a baby's chart. Lactation Consultation Note Baby is 15 hrs old. 34 wks, in NICU. Mom had GDM. Mom's 3rd child. Mom has 7 and 28 yr old at home. Mom stated her milk didn't come in for her 1st child but did for her 2nd child and BF for 4 months. Mom stated she has been leaking for several weeks w/this pregnancy. Mom has used DEBP and collected 6 ml. Praised mom. Encouraged to hand express after pumping to collect more colostrum. Asked mom if she knows how to hand express mom stated yes.   Milk storage, hand expression, labeling BM, discussed. Mom has a few colostrum containers. Gave mom more. Reviewed cleaning.  Encouraged mom to call for questions or concerns about pumping or milk storage. Encouraged to massage breast at intervals if able during pumping. Discussed hands free bra.  Lactation brochure given. Discussed Lactation for NICU when needed.   Patient Name: Marilyn Cooper [id number] Date: 08/30/2019 Reason for consult: Initial assessment;NICU baby;Late-preterm 34-36.6wks;Maternal endocrine disorder Type of Endocrine Disorder?: Diabetes   Maternal Data Does the patient have breastfeeding experience prior to this delivery?: Yes  Feeding Feeding Type: Donor Breast Milk  LATCH Score                   Interventions Interventions: DEBP  Lactation Tools Discussed/Used WIC Program: No Pump Review: Milk Storage;Setup, frequency, and cleaning Initiated by:: RN Date initiated:: 08/29/19   Consult Status Consult Status: Follow-up Date: 08/31/19 Follow-up type: In-patient    Charyl Dancer 08/30/2019, 6:07 AM

## 2019-08-30 NOTE — Plan of Care (Signed)
  Problem: Education: Goal: Knowledge of condition will improve Outcome: Progressing   

## 2019-08-30 NOTE — Anesthesia Postprocedure Evaluation (Signed)
Anesthesia Post Note  Patient: Marilyn Cooper  Procedure(s) Performed: CYSTOSCOPY (N/A ) EXPLORATORY LAPAROTOMY (N/A ) BLADDER REPAIR (N/A )     Patient location during evaluation: PACU Anesthesia Type: Combined Spinal/Epidural Level of consciousness: awake and alert Pain management: pain level controlled Vital Signs Assessment: post-procedure vital signs reviewed and stable Respiratory status: spontaneous breathing, nonlabored ventilation and respiratory function stable Cardiovascular status: stable Postop Assessment: no headache, no backache and epidural receding Anesthetic complications: no   No complications documented.  Last Vitals:  Vitals:   08/30/19 1600 08/30/19 1700  BP:    Pulse:    Resp: 18 19  Temp:    SpO2: 97% 100%    Last Pain:  Vitals:   08/30/19 1710  TempSrc:   PainSc: 4    Pain Goal: Patients Stated Pain Goal: 4 (08/30/19 1710)                 Mallie Linnemann L Sandro Burgo

## 2019-08-30 NOTE — Progress Notes (Signed)
CRITICAL VALUE ALERT  Critical Value:  Hemoglobin 6.7  Date & Time Notied:  08/30/2019 2118  Provider Notified: Dr Dareen Piano  Orders Received/Actions taken: none

## 2019-08-31 LAB — CBC
HCT: 26.3 % — ABNORMAL LOW (ref 36.0–46.0)
Hemoglobin: 8.3 g/dL — ABNORMAL LOW (ref 12.0–15.0)
MCH: 25.9 pg — ABNORMAL LOW (ref 26.0–34.0)
MCHC: 31.6 g/dL (ref 30.0–36.0)
MCV: 81.9 fL (ref 80.0–100.0)
Platelets: 264 10*3/uL (ref 150–400)
RBC: 3.21 MIL/uL — ABNORMAL LOW (ref 3.87–5.11)
RDW: 13.9 % (ref 11.5–15.5)
WBC: 7.9 10*3/uL (ref 4.0–10.5)
nRBC: 0 % (ref 0.0–0.2)

## 2019-08-31 LAB — GLUCOSE, CAPILLARY
Glucose-Capillary: 116 mg/dL — ABNORMAL HIGH (ref 70–99)
Glucose-Capillary: 95 mg/dL (ref 70–99)
Glucose-Capillary: 88 mg/dL (ref 70–99)
Glucose-Capillary: 135 mg/dL — ABNORMAL HIGH (ref 70–99)

## 2019-08-31 LAB — PREPARE RBC (CROSSMATCH)

## 2019-08-31 MED ORDER — SODIUM CHLORIDE 0.9% IV SOLUTION: Freq: Once | INTRAVENOUS | Status: AC

## 2019-08-31 MED ORDER — DIPHENHYDRAMINE HCL 25 MG PO CAPS
25.0000 mg | ORAL_CAPSULE | Freq: Once | ORAL | Status: AC
  Administered 2019-08-31: 25 mg via ORAL
  Filled 2019-08-31: qty 1

## 2019-08-31 MED ORDER — FUROSEMIDE 10 MG/ML IJ SOLN
20.0000 mg | Freq: Once | INTRAMUSCULAR | Status: AC
  Administered 2019-08-31: 20 mg via INTRAVENOUS
  Filled 2019-08-31: qty 2

## 2019-08-31 MED ORDER — NIFEDIPINE ER OSMOTIC RELEASE 30 MG PO TB24
60.0000 mg | ORAL_TABLET | Freq: Every day | ORAL | Status: DC
  Administered 2019-08-31 – 2019-09-01 (×2): 60 mg via ORAL
  Filled 2019-08-31 (×2): qty 2

## 2019-08-31 MED ORDER — ACETAMINOPHEN 325 MG PO TABS
650.0000 mg | ORAL_TABLET | Freq: Once | ORAL | Status: AC
  Administered 2019-08-31: 650 mg via ORAL
  Filled 2019-08-31: qty 2

## 2019-08-31 NOTE — Lactation Note (Addendum)
This note was copied from a baby's chart. Lactation Consultation Note  Patient Name: Marilyn Cooper [id number] Date: 08/31/2019 Reason for consult: NICU baby;Follow-up assessment;Early term 37-38.6wks;Maternal endocrine disorder;Other (Comment) Type of Endocrine Disorder?: Diabetes    Infant is 42 hours old 35 weeks. Mom hx of Anemia, Diabetes, Preeclampsia, and Chronic Hypertension.  Infant is currently in the NICCU. Mom started pumping on Thursday every 4 hours with some colostrum collected 6 to 7 ml. Mom notes her colostrum decreased on Friday with little or no collection with each attempt. Mom is currently receiving a blood transfusion. Mom states she has hx of diminished milk supply with previous pregnancies. Her last child, she was only able to supply milk for the first four months.   Education provided to encourage Mom to continue hand expression, breast massage and increase the frequency of her pumping every 2-3 hours during the day, 3-4 hours during the night. Mom plans to eat lunch and rest after the blood transfusion and will resume pumping with increase in frequency this afternoon.   Mom does not have a pump at home. She is aware there is a pump in the NICU she can use. She will purchase a pump for home. She is also aware she can rent a pump from the gift store if needed.     Interventions Interventions: DEBP  Lactation Tools Discussed/Used Tools: Pump Breast pump type: Double-Electric Breast Pump   Consult Status Consult Status: Follow-up Date: 09/01/19 Follow-up type: In-patient    Brandin Dilday  Nicholson-Springer 08/31/2019, 11:16 AM

## 2019-08-31 NOTE — Clinical Social Work Maternal (Signed)
CLINICAL SOCIAL WORK MATERNAL/CHILD NOTE  Patient Details  Name: Marilyn Cooper MRN: [id number] Date of Birth: 1990-03-28  Date:  08/31/2019  Clinical Social Worker Initiating Note:  Georgeanne Nim, MSW, LCSW Date/Time: Initiated:  08/31/19/1300     Child's Name:  Marilyn Cooper   Biological Parents:  Mother, Father (FOB, Marilyn Cooper, 11/15/1991)   Need for Interpreter:  None   Reason for Referral:  Behavioral Health Concerns, Parental Support of Premature Babies < 32 weeks/or Critically Ill babies   Address:  [place] [address]    Phone number:  [phone] (home)     Additional phone number: none stated  Household Members/Support Persons (HM/SP):   Household Member/Support Person 1, Household Member/Support Person 2, Household Member/Support Person 3   HM/SP Name Relationship DOB or Age  HM/SP -1 Marilyn Cooper FOB 11/15/1991  HM/SP -[place] son 7  HM/SP -3 Marilyn Cooper daughter 4  HM/SP -4        HM/SP -5        HM/SP -6        HM/SP -7        HM/SP -8          Natural Supports (not living in the home):  Extended Family, Friends, Immediate Family, Artist Supports: None   Employment: Homemaker   Type of Work:     Education:  [place] arranged:    Museum/gallery curator Resources:  Medicaid   Other Resources:  [employer]   Cultural/Religious Considerations Which May Impact Care:  none stated  Strengths:  Engineer, materials, Understanding of illness, Home prepared for child , Ability to meet basic needs    Psychotropic Medications:         Pediatrician:    [place] area  Pediatrician List:   [employer] [facility] the [place]      Pediatrician Fax Number:    Risk Factors/Current Problems:  None   Cognitive State:  Able to Concentrate , Alert    Mood/Affect:  Tearful , Interested    CSW  Assessment: CSW received consult for hx of anxiety.  CSW met with MOB at bedside to offer support and complete assessment. On arrival, CSW introduced self and stated reason for visit. MOB was initially alone in room, as FOB was visited infant Marilyn Cooper in NICU, however, FOB arrived at end of visit and participated in  education portion. MOB and FOB were pleasant and engaged during visit.   After introduction, CSW asked MOB how she was feeling. Instantly, MOB started crying and sharing feelings of guilt pertaining to infant's medical status and current inability to provide physical care for two older children. CSW offered MOB space to express feelings, then, offerred validation and encouragement.  CSW assessment mental health hx, and MOB confirmed dx of anxiety. MOB stated she has dealt with anxiety for years. MOB denied any other BH dx. MOB denied and, SI, HI, or domestic violence.  MOB identified sx as intense worry and interrupted sleep due to intrusive thoughts. MOB stated she was scheduled to see a therapist at [facility] this past Tuesday, however, complications with pregnancy prevented it. MOB plans to reschedule. MOB stated, for now, she uses logical thinking to manage sx, "I will logically think through it and tell myself, "Now, that doesn't make sense' to calm myself  down." MOB identified FOB, parents, brother, FOB's father and brother, and  FOB's best friend as support system. MOB stated she feel better and is looking forward to holding infant once she is permitted to visit. MOB stated family's visitation plans and care schedule for other children. CSW celebrated MOB efforts and offered additional support.   CSW provided education regarding the baby blues period vs. perinatal mood disorders, discussed treatment and gave resources for mental health follow up if concerns arise.  CSW recommends self-evaluation during the postpartum time period using the New Mom Checklist from Postpartum Progress and encouraged MOB  and FOB  to contact a medical professional if symptoms are noted at any time. MOB and FOB stated understanding and denied any questions.     CSW provided review of Sudden Infant Death Syndrome (SIDS) precautions. MOB stated understanding and denied any questions. FOB stated he has not had the opportunity to go get a car seat. CSW informed FOB of option to purchase through hospital if unable to afford or get out to purchase. FOB stated appreciation. CSW instructed MOB and FOB to contact CSW if car seat is needed at discharge. Both agreed. FOB also stated interest contact information for food stamps. FOB is the only one bringing in income and stated approval would relieve some pressure for him. SW agreed to provided resource information. MOB and FOB confirmed having all other needed item for baby including bassinet and crib for baby's safe sleep. CSW informed FOB and MOB of additional f/u by NICU CSW. Both stated appreciation.      CSW identifies no further need for intervention and no barriers to discharge at this time.  CSW Plan/Description:  Sudden Infant Death Syndrome (SIDS) Education, Perinatal Mood and Anxiety Disorder (PMADs) Education    Liba Hulsey D. Lissa Morales, MSW, LCSW Clinical Social Worker [phone] 08/31/2019, 4:54 PM

## 2019-08-31 NOTE — Progress Notes (Signed)
POD#2 Pt states that she has some dizziness with ambulation. Her lochia is moderate. Still has not had a large diureses.  VSSAF Hgb-6.7 Urine still clear Dressing no change IMP/ Anemic with symptoms Plan/ Discussed options to tx anemia. She would like to proceed with transfusion. Will transfuse 2 units of PRBCs           Will fit for leg bag           Will stop labetalol           Will decrease procardia to 60mg /day           Will give lasix 20 mg

## 2019-09-01 LAB — TYPE AND SCREEN
ABO/RH(D): A POS
Antibody Screen: NEGATIVE
Unit division: 0
Unit division: 0

## 2019-09-01 LAB — BPAM RBC
Blood Product Expiration Date: 202108192359
Blood Product Expiration Date: 202108192359
ISSUE DATE / TIME: 202108140828
ISSUE DATE / TIME: 202108141202
Unit Type and Rh: 6200
Unit Type and Rh: 6200

## 2019-09-01 LAB — GLUCOSE, CAPILLARY: Glucose-Capillary: 92 mg/dL (ref 70–99)

## 2019-09-01 MED ORDER — LABETALOL HCL 200 MG PO TABS: 400.0000 mg | ORAL_TABLET | Freq: Two times a day (BID) | ORAL | 0 refills | Status: DC

## 2019-09-01 MED ORDER — METFORMIN HCL 500 MG PO TABS: 500.0000 mg | ORAL_TABLET | Freq: Two times a day (BID) | ORAL | 0 refills | Status: DC

## 2019-09-01 MED ORDER — NIFEDIPINE ER 60 MG PO TB24: 60.0000 mg | ORAL_TABLET | Freq: Every day | ORAL | 0 refills | Status: DC

## 2019-09-01 MED ORDER — FERROUS SULFATE 325 (65 FE) MG PO TABS: 325.0000 mg | ORAL_TABLET | Freq: Two times a day (BID) | ORAL | 0 refills | Status: DC

## 2019-09-01 NOTE — Plan of Care (Signed)
Pt to be discharged with printed printed. Carmelina Dane, RN

## 2019-09-01 NOTE — Lactation Note (Signed)
This note was copied from a baby's chart. Lactation Consultation Note  Patient Name: Marilyn Cooper OINOM'V Date: 09/01/2019 Reason for consult: Follow-up assessment;NICU baby;Early term 37-38.6wks;Maternal endocrine disorder Type of Endocrine Disorder?: Diabetes  Visited with mom of a 12 hours old LPI NICU female, mom is getting discharged today. Reviewed engorgement prevention/treatment, treatment/prevention for sore nipples and supply/demand. Baby is still staying in the NICU, he's currently on donor/mother's milk. Mom told LC she's been pumping about 30 ml of EBM, praised her for her efforts.  She's pleasantly surprised because she didn't have such a great supply with her first or second child, she always had to supplement with formula. Stressed to mom the importance of consistent pumping, she's planning on buying a pump for home use but she has her single pump at home in the the mean time; she also has a friend that has a Medela DEBP that she can borrow until she gets a new one tomorrow.  Dad asked LC for a pump rental but gift shop is closed. LC asked if mom is a [facility] participant and she said no, mom not eligible for a [facility] loaner at this point. Parents understood the importance of getting a DEBP in order to protect mom's supply, LC recommended a couple of good brands. Parents reported all questions and concerns were answered, they're both aware of LC OP services and will call PRN.   Maternal Data    Feeding Feeding Type: Donor Breast Milk  LATCH Score                   Interventions Interventions: Breast feeding basics reviewed;DEBP  Lactation Tools Discussed/Used Tools: Pump Breast pump type: Double-Electric Breast Pump   Consult Status Consult Status: Follow-up Date: 09/02/19 Follow-up type: In-patient    Marilyn Cooper Venetia Constable 09/01/2019, 12:18 PM

## 2019-09-01 NOTE — Progress Notes (Signed)
POD#3 Pt states that she feels much better after transfusion. Ambulating well. Good urine output. Vaginal bleeding minimal. She would like to go home. B/P slightly elevated since stopping labetalol Abd - soft non tender IMP/Stable Plan/ Will discharge to home            Will restart labetalol           Will see in office on Friday for cath removal and b/p check.

## 2019-09-02 LAB — SURGICAL PATHOLOGY

## 2019-09-03 ENCOUNTER — Ambulatory Visit: Payer: Self-pay

## 2019-09-03 NOTE — Lactation Note (Signed)
This note was copied from a baby's chart. Lactation Consultation Note  Patient Name: Marilyn Cooper ENIDP'O Date: 09/03/2019 Reason for consult: Follow-up assessment;NICU baby   RN requested mom observe BF.  This is moms second time to try to BF her NICU infant.  LC first praised mom for pumping.  Mom is feeling good about pumping and denies any questions/ concerns regarding pump parts, ect...  LC encouraged mom to always place the infant STS when BF.    Infant was placed in football position.  He would suck the gloved finger but would not latch to the breast.  Size 20 NS placed then mom attempted to relatch.  Infant opened and closed his mouth around the nipple but would not suck.  After a few minutes,  Approx. 4-5 sucks were noted; non nutritive.  Mom was excited to have baby STS and at the breast.  LC praised mom for her efforts and encouraged her to continue to place infant STS and offer the breast.  LC discussed his behavior and that it was age appropriate.  Mom is determined to continue her pumping efforts in order to establish her milk supply.     LC encouraged mom to let RN know when she desired lactation to observe and assist with a future feeding.    Mom BF her 2nd child and used a NS so is familiar with them and the proper way to apply.      Maternal Data    Feeding Feeding Type: Breast Fed  LATCH Score Latch: Too sleepy or reluctant, no latch achieved, no sucking elicited.  Audible Swallowing: None  Type of Nipple: Flat  Comfort (Breast/Nipple): Soft / non-tender  Hold (Positioning): Assistance needed to correctly position infant at breast and maintain latch.  LATCH Score: 4  Interventions Interventions: Breast feeding basics reviewed;Assisted with latch;Skin to skin;Breast massage;Hand express;Support pillows;Adjust position  Lactation Tools Discussed/Used Tools: Nipple Shields Nipple shield size: 20 Breast pump type: Double-Electric Breast  Pump   Consult Status Consult Status: Follow-up Date: 09/04/19 Follow-up type: In-patient    Maryruth Hancock [facility] 09/03/2019, 3:32 PM

## 2019-09-03 NOTE — Discharge Summary (Signed)
NAME: Marilyn Cooper, Marilyn Cooper MEDICAL RECORD [id number] ACCOUNT [id number] DATE OF BIRTH:1990/11/29 FACILITY: [facility] LOCATION: [facility] PHYSICIAN:Howie Rufus Orvilla Cornwall, MD  DISCHARGE SUMMARY  DATE OF DISCHARGE:  09/01/2019  PRINCIPAL DIAGNOSES: 1.  Intrauterine pregnancy at 52 and 5/7 weeks. 2.  Chronic hypertension with superimposed preeclampsia. 3.  Gestational diabetes. 4.  Morbid obesity. 5.  Injury to bladder. 6.  History of prior cesarean section.  PRINCIPAL PROCEDURES: 1.  Repeat low transverse cesarean section. 2.  Cystoscopy. 3.  Repair of bladder injury.  HISTORY OF PRESENT ILLNESS:  The patient is a 29 year old white female G6, P1-1-3-2, who presented to the emergency room at 34 and 5/7 weeks complaining of a severe headache.  The patient had known history of chronic hypertension with superimposed  preeclampsia.  She was currently on Procardia 90 mg a day along with labetalol 600 mg b.i.d.  While in the emergency room she was given Tylenol and did not have relief of her headache.  She also had an IV headache cocktail which made no difference.  She  did have visual scotomata.  At that point, it was decided that the patient would best be served with a repeat cesarean section.  A complete description of the hospital admission can be found in the dictated H and P. The patient underwent repeat cesarean  section by Dr. Waynard Reeds on 08/29/2019.  Complete description of this procedure is located in the dictated operative note.  The patient did deliver 1 live viable white female infant in the vertex presentation.  Apgars were 8 at one minute and 8 at five   minutes.  Birth weight was 6 pounds 5 ounces.  The baby was transferred to the NICU.  Placenta was sent to pathology and is pending at this time.  While in the recovery room, the patient was noted to have hematuria.  In light of this, urology was  contacted by phone to discuss the situation.  The plan was to proceed to the operating room for  cystoscopy if an injury was noted to be repaired by doing another laparotomy.  The patient was taken to the operative suite where a cystoscopy was performed.   At that point, it was noted that there was a suture noted in the dome of the bladder.  Given that, the patient was set up for repair through the previous cesarean section incision.  Please the entire operative note for complete description of this  procedure.  Postoperatively, the patient did very well.  She was voiding well with clear fluid via the surgery.  She tolerated a regular diet.  She ambulated without difficulty.  However, on the 2nd day, the patient was complaining of some dizziness when  she ambulated and of note, her hemoglobin was 6.7.  She was offered to continue following this or proceed with a blood transfusion.  The patient elected to undergo transfusion of 2 packed red blood cells.  This was accomplished without difficulty.  The  patient's post-transfusion hemoglobin was 8.3.  During the postop time, the patient's blood pressure was slightly low.  Therefore, the dose of Procardia was decreased to 60 mg and labetalol was discontinued.  24 hours later, the patient's blood pressure  was slightly elevated and labetalol was restarted at 400 mg p.o. b.i.d.  The patient was discharged to home on postoperative day #3, she was instructed to follow up on Friday to have the incision checked and also the catheter removed.  She was sent home  with Percocet to take  p.r.n.  She was instructed to continue taking her iron, also Procardia 60 mg every day and labetalol 400 mg.  She will continue with the decreased dose of metformin.  CN/NUANCE D:09/02/2019 T:09/03/2019 JOB:[id number]

## 2019-09-05 ENCOUNTER — Ambulatory Visit: Payer: Self-pay

## 2019-09-05 NOTE — Lactation Note (Signed)
This note was copied from a baby's chart. Lactation Consultation Note  Patient Name: Marilyn Cooper KGMWN'U Date: 09/05/2019 Reason for consult: Follow-up assessment;NICU baby;Late-preterm 34-36.6wks;Maternal endocrine disorder Type of Endocrine Disorder?: Diabetes GDM on Metformin  LC in to visit with P3 Mom of LPTI in the NICU.  Baby is 7 days old and currently exclusively gavage fed donor milk or expressed breast milk.  Mom has a history of low milk supply with first 2 children.  Mom expressing 60 ml per pumping and is pumping 6 times a day using a [employer] DEBP (used).    Mom holding her baby swaddled against her clothed chest.  Baby is sleeping.  Offered to assist her in putting baby STS on her chest, explaining the benefits to stimulating her milk supply AND helping baby start cueing to breastfeed.  Mom declined saying she would wait until feeding time.    Mom concerned about the fat content in her milk as baby's weight has been going up and down by 10-20 gms per day.  Mom encouraged to eat a well balanced diet, take her PNVs and pump more often or 8 or more times per 24 hrs.  Recommended she rent a Symphony DEBP from the gift shop for better stimulation for a fuller milk supply.  Mom has been using the initiation setting on the pump in baby's room, so reviewed the maintenance setting for her to use now and to pump for 15-30 mins.  Encouraged breast massage and hand expression before and after pumping.  Power pumping routine reviewed to stimulate her milk supply.  Reminded Mom that the pump is a machine and baby at the breast nuzzling STS and frequent pumping is the best way to increase milk supply.  If Mom's supply was more plentiful, she could pump off the foremilk and save the hindmilk (higher fat content) for baby's gavage and for breastfeeding.    Mom knows to ask for assistance from RN and lactation prn.   Interventions Interventions: Breast feeding basics reviewed;Skin to  skin;Breast massage;Hand express;DEBP  Lactation Tools Discussed/Used Tools: Pump;Nipple Shields Nipple shield size: 20 Breast pump type: Double-Electric Breast Pump   Consult Status Consult Status: Follow-up Date: 09/06/19 Follow-up type: In-patient    Judee Clara 09/05/2019, 2:34 PM

## 2019-09-06 ENCOUNTER — Ambulatory Visit: Payer: Medicaid Other

## 2019-09-14 ENCOUNTER — Other Ambulatory Visit (HOSPITAL_COMMUNITY): Admission: RE | Admit: 2019-09-14 | Payer: Medicaid Other | Source: Ambulatory Visit

## 2020-04-11 ENCOUNTER — Emergency Department (HOSPITAL_COMMUNITY)
Admission: AD | Admit: 2020-04-11 | Discharge: 2020-04-11 | Disposition: A | Discharge status: Home / Self Care | Payer: Medicaid Other | Attending: Obstetrics and Gynecology | Admitting: Obstetrics and Gynecology

## 2020-04-11 ENCOUNTER — Encounter (HOSPITAL_COMMUNITY): Payer: Self-pay

## 2020-04-11 ENCOUNTER — Other Ambulatory Visit: Payer: Self-pay

## 2020-04-11 ENCOUNTER — Emergency Department (HOSPITAL_COMMUNITY): Payer: Medicaid Other

## 2020-04-11 DIAGNOSIS — Z3491 Encounter for supervision of normal pregnancy, unspecified, first trimester: Secondary | ICD-10-CM

## 2020-04-11 DIAGNOSIS — N760 Acute vaginitis: Secondary | ICD-10-CM

## 2020-04-11 DIAGNOSIS — O10011 Pre-existing essential hypertension complicating pregnancy, first trimester: Secondary | ICD-10-CM | POA: Diagnosis not present

## 2020-04-11 DIAGNOSIS — B9689 Other specified bacterial agents as the cause of diseases classified elsewhere: Secondary | ICD-10-CM | POA: Diagnosis not present

## 2020-04-11 DIAGNOSIS — O468X1 Other antepartum hemorrhage, first trimester: Secondary | ICD-10-CM

## 2020-04-11 DIAGNOSIS — O23591 Infection of other part of genital tract in pregnancy, first trimester: Secondary | ICD-10-CM | POA: Diagnosis not present

## 2020-04-11 DIAGNOSIS — O209 Hemorrhage in early pregnancy, unspecified: Secondary | ICD-10-CM | POA: Diagnosis present

## 2020-04-11 DIAGNOSIS — O10919 Unspecified pre-existing hypertension complicating pregnancy, unspecified trimester: Secondary | ICD-10-CM

## 2020-04-11 DIAGNOSIS — O34219 Maternal care for unspecified type scar from previous cesarean delivery: Secondary | ICD-10-CM | POA: Insufficient documentation

## 2020-04-11 DIAGNOSIS — Z3A10 10 weeks gestation of pregnancy: Secondary | ICD-10-CM | POA: Diagnosis not present

## 2020-04-11 DIAGNOSIS — O418X10 Other specified disorders of amniotic fluid and membranes, first trimester, not applicable or unspecified: Secondary | ICD-10-CM

## 2020-04-11 DIAGNOSIS — O208 Other hemorrhage in early pregnancy: Secondary | ICD-10-CM | POA: Diagnosis not present

## 2020-04-11 DIAGNOSIS — O10911 Unspecified pre-existing hypertension complicating pregnancy, first trimester: Secondary | ICD-10-CM

## 2020-04-11 LAB — CBC
HCT: 36.5 % (ref 36.0–46.0)
Hemoglobin: 11.6 g/dL — ABNORMAL LOW (ref 12.0–15.0)
MCH: 23.4 pg — ABNORMAL LOW (ref 26.0–34.0)
MCHC: 31.8 g/dL (ref 30.0–36.0)
MCV: 73.6 fL — ABNORMAL LOW (ref 80.0–100.0)
Platelets: 312 10*3/uL (ref 150–400)
RBC: 4.96 MIL/uL (ref 3.87–5.11)
RDW: 16.2 % — ABNORMAL HIGH (ref 11.5–15.5)
WBC: 7.7 10*3/uL (ref 4.0–10.5)
nRBC: 0 % (ref 0.0–0.2)

## 2020-04-11 LAB — WET PREP, GENITAL
Sperm: NONE SEEN
Trich, Wet Prep: NONE SEEN
Yeast Wet Prep HPF POC: NONE SEEN

## 2020-04-11 LAB — HCG, QUANTITATIVE, PREGNANCY: hCG, Beta Chain, Quant, S: 44263 m[IU]/mL — ABNORMAL HIGH (ref <5)

## 2020-04-11 LAB — POCT PREGNANCY, URINE: Preg Test, Ur: POSITIVE — AB

## 2020-04-11 MED ORDER — METRONIDAZOLE 500 MG PO TABS
500.0000 mg | ORAL_TABLET | Freq: Two times a day (BID) | ORAL | 0 refills | Status: DC
Start: 2020-04-11 — End: 2020-04-14

## 2020-04-11 NOTE — Discharge Instructions (Signed)
Safe Medications in Pregnancy   Acne: Benzoyl Peroxide Salicylic Acid  Backache/Headache: Tylenol: 2 regular strength every 4 hours OR              2 Extra strength every 6 hours  Colds/Coughs/Allergies: Benadryl (alcohol free) 25 mg every 6 hours as needed Breath right strips Claritin Cepacol throat lozenges Chloraseptic throat spray Cold-Eeze- up to three times per day Cough drops, alcohol free Flonase (by prescription only) Guaifenesin Mucinex Robitussin DM (plain only, alcohol free) Saline nasal spray/drops Sudafed (pseudoephedrine) & Actifed ** use only after [redacted] weeks gestation and if you do not have high blood pressure Tylenol Vicks Vaporub Zinc lozenges Zyrtec   Constipation: Colace Ducolax suppositories Fleet enema Glycerin suppositories Metamucil Milk of magnesia Miralax Senokot Smooth move tea  Diarrhea: Kaopectate Imodium A-D  *NO pepto Bismol  Hemorrhoids: Anusol Anusol HC Preparation H Tucks  Indigestion: Tums Maalox Mylanta Zantac  Pepcid  Insomnia: Benadryl (alcohol free) 25mg every 6 hours as needed Tylenol PM Unisom, no Gelcaps  Leg Cramps: Tums MagGel  Nausea/Vomiting:  Bonine Dramamine Emetrol Ginger extract Sea bands Meclizine  Nausea medication to take during pregnancy:  Unisom (doxylamine succinate 25 mg tablets) Take one tablet daily at bedtime. If symptoms are not adequately controlled, the dose can be increased to a maximum recommended dose of two tablets daily (1/2 tablet in the morning, 1/2 tablet mid-afternoon and one at bedtime). Vitamin B6 100mg tablets. Take one tablet twice a day (up to 200 mg per day).  Skin Rashes: Aveeno products Benadryl cream or 25mg every 6 hours as needed Calamine Lotion 1% cortisone cream  Yeast infection: Gyne-lotrimin 7 Monistat 7   **If taking multiple medications, please check labels to avoid duplicating the same active ingredients **take medication as directed on  the label ** Do not exceed 4000 mg of tylenol in 24 hours **Do not take medications that contain aspirin or ibuprofen    Obstetrics: Normal and Problem Pregnancies (7th ed., pp. 102-121). [place], [place]: Elsevier."> Textbook of Family Medicine (9th ed., pp. 365-410). [place], [place]: Else[employer].">  First Trimester of Pregnancy  The first trimester of pregnancy starts on the first day of your last menstrual period until the end of week 12. This is months 1 through 3 of pregnancy. A week after a sperm fertilizes an egg, the egg will implant into the wall of the uterus and begin to develop into a baby. By the end of 12 weeks, all the baby's organs will be formed and the baby will be 2-3 inches in size. Body changes during your first trimester Your body goes through many changes during pregnancy. The changes vary and generally return to normal after your baby is born. Physical changes  You may gain or lose weight.  Your breasts may begin to grow larger and become tender. The tissue that surrounds your nipples (areola) may become darker.  Dark spots or blotches (chloasma or mask of pregnancy) may develop on your face.  You may have changes in your hair. These can include thickening or thinning of your hair or changes in texture. Health changes  You may feel nauseous, and you may vomit.  You may have heartburn.  You may develop headaches.  You may develop constipation.  Your gums may bleed and may be sensitive to brushing and flossing. Other changes  You may tire easily.  You may urinate more often.  Your menstrual periods will stop.  You may have a loss of appetite.  You may develop   cravings for certain kinds of food.  You may have changes in your emotions from day to day.  You may have more vivid and strange dreams. Follow these instructions at home: Medicines  Follow your health care provider's instructions regarding medicine use. Specific medicines may be  either safe or unsafe to take during pregnancy. Do not take any medicines unless told to by your health care provider.  Take a prenatal vitamin that contains at least 600 micrograms (mcg) of folic acid. Eating and drinking  Eat a healthy diet that includes fresh fruits and vegetables, whole grains, good sources of protein such as meat, eggs, or tofu, and low-fat dairy products.  Avoid raw meat and unpasteurized juice, milk, and cheese. These carry germs that can harm you and your baby.  If you feel nauseous or you vomit: ? Eat 4 or 5 small meals a day instead of 3 large meals. ? Try eating a few soda crackers. ? Drink liquids between meals instead of during meals.  You may need to take these actions to prevent or treat constipation: ? Drink enough fluid to keep your urine pale yellow. ? Eat foods that are high in fiber, such as beans, whole grains, and fresh fruits and vegetables. ? Limit foods that are high in fat and processed sugars, such as fried or sweet foods. Activity  Exercise only as directed by your health care provider. Most people can continue their usual exercise routine during pregnancy. Try to exercise for 30 minutes at least 5 days a week.  Stop exercising if you develop pain or cramping in the lower abdomen or lower back.  Avoid exercising if it is very hot or humid or if you are at high altitude.  Avoid heavy lifting.  If you choose to, you may have sex unless your health care provider tells you not to. Relieving pain and discomfort  Wear a good support bra to relieve breast tenderness.  Rest with your legs elevated if you have leg cramps or low back pain.  If you develop bulging veins (varicose veins) in your legs: ? Wear support hose as told by your health care provider. ? Elevate your feet for 15 minutes, 3-4 times a day. ? Limit salt in your diet. Safety  Wear your seat belt at all times when driving or riding in a car.  Talk with your health care  provider if someone is verbally or physically abusive to you.  Talk with your health care provider if you are feeling sad or have thoughts of hurting yourself. Lifestyle  Do not use hot tubs, steam rooms, or saunas.  Do not douche. Do not use tampons or scented sanitary pads.  Do not use herbal remedies, alcohol, illegal drugs, or medicines that are not approved by your health care provider. Chemicals in these products can harm your baby.  Do not use any products that contain nicotine or tobacco, such as cigarettes, e-cigarettes, and chewing tobacco. If you need help quitting, ask your health care provider.  Avoid cat litter boxes and soil used by cats. These carry germs that can cause birth defects in the baby and possibly loss of the unborn baby (fetus) by miscarriage or stillbirth. General instructions  During routine prenatal visits in the first trimester, your health care provider will do a physical exam, perform necessary tests, and ask you how things are going. Keep all follow-up visits. This is important.  Ask for help if you have counseling or nutritional needs during pregnancy. Your   health care provider can offer advice or refer you to specialists for help with various needs.  Schedule a dentist appointment. At home, brush your teeth with a soft toothbrush. Floss gently.  Write down your questions. Take them to your prenatal visits. Where to find more information  American Pregnancy Association: americanpregnancy.org  American College of Obstetricians and Gynecologists: acog.org/en/Womens%20Health/Pregnancy  Office on Women's Health: womenshealth.gov/pregnancy Contact a health care provider if you have:  Dizziness.  A fever.  Mild pelvic cramps, pelvic pressure, or nagging pain in the abdominal area.  Nausea, vomiting, or diarrhea that lasts for 24 hours or longer.  A bad-smelling vaginal discharge.  Pain when you urinate.  Known exposure to a contagious illness,  such as chickenpox, measles, Zika virus, HIV, or hepatitis. Get help right away if you have:  Spotting or bleeding from your vagina.  Severe abdominal cramping or pain.  Shortness of breath or chest pain.  Any kind of trauma, such as from a fall or a car crash.  New or increased pain, swelling, or redness in an arm or leg. Summary  The first trimester of pregnancy starts on the first day of your last menstrual period until the end of week 12 (months 1 through 3).  Eating 4 or 5 small meals a day rather than 3 large meals may help to relieve nausea and vomiting.  Do not use any products that contain nicotine or tobacco, such as cigarettes, e-cigarettes, and chewing tobacco. If you need help quitting, ask your health care provider.  Keep all follow-up visits. This is important. This information is not intended to replace advice given to you by your health care provider. Make sure you discuss any questions you have with your health care provider. Document Revised: 06/12/2019 Document Reviewed: 04/18/2019 Elsevier Patient Education  2021 Elsevier Inc.  

## 2020-04-11 NOTE — MAU Note (Signed)
Pt reports to mau with c/o vag spotting since Thursday.   Pt states she has been seeing brownish/red spotting every morning since Thursday when wiping.  Pt denies recent intercourse or abd pain.

## 2020-04-11 NOTE — MAU Provider Note (Addendum)
History     CSN: [id number]  Arrival date and time: 04/11/20 4174   Event Date/Time   First Provider Initiated Contact with Patient 04/11/20 0919      Chief Complaint  Patient presents with  . Vaginal Bleeding   HPI Marilyn Cooper is a 30 y.o. [id number] at [redacted]w[redacted]d who presents with vaginal bleeding. She states she has seen spotting every day when she woke up since Thursday. Today it was bright red bleeding instead of brown. She denies any pain. She denies any abnormal discharge. She has chronic hypertension and feels like she has been having a flushed face the last several days and is worried her blood pressure is elevated. She denies any HA, chest pain or shortness of breath. She had a repeat c/s 7 months ago and discontinued her blood pressure medication 3 weeks after delivery.   OB History    Gravida  7   Para  3   Term  1   Preterm  2   AB  3   Living  3     SAB      IAB  3   Ectopic      Multiple  0   Live Births  3           Past Medical History:  Diagnosis Date  . Anemia   . Anxiety   . Asthma    childhood  . Chronic hypertension   . Depression    see social work consult  . Gestational diabetes   . Hx of varicella   . Obese     Past Surgical History:  Procedure Laterality Date  . BLADDER REPAIR N/A 08/29/2019   Procedure: BLADDER REPAIR;  Surgeon: Waynard Reeds, MD;  [facility]: [facility];  Service: Gynecology;  Laterality: N/A;  . CESAREAN SECTION N/A 03/09/2012   Procedure: CESAREAN SECTION;  Surgeon: Freddrick March. Tenny Craw, MD;  Location: [facility];  Service: Obstetrics;  Laterality: N/A;  . CESAREAN SECTION N/A 12/03/2014   Procedure: CESAREAN SECTION;  Surgeon: Marlow Baars, MD;  Location: [facility];  Service: Obstetrics;  Laterality: N/A;  . CESAREAN SECTION N/A 08/29/2019   Procedure: CESAREAN SECTION;  Surgeon: Waynard Reeds, MD;  Location: [facility];  Service: Obstetrics;  Laterality: N/A;  . COLPOSCOPY    . CYSTOSCOPY N/A 08/29/2019   Procedure:  CYSTOSCOPY;  Surgeon: Waynard Reeds, MD;  Location: [facility];  Service: Gynecology;  Laterality: N/A;  . HERNIA REPAIR    . LAPAROTOMY N/A 08/29/2019   Procedure: EXPLORATORY LAPAROTOMY;  Surgeon: Waynard Reeds, MD;  Location: [facility];  Service: Gynecology;  Laterality: N/A;  . TONSILLECTOMY    . UMBILICAL HERNIA REPAIR N/A 06/21/2014   Procedure: EXPLORATORY LAPAROTOMY,  SMALL BOWEL RESECTION, PRIMARY REPAIR INCARCERATED VENTRAL HERNIA;  Surgeon: Darnell Level, MD;  Location: [facility];  Service: General;  Laterality: N/A;    Family History  Problem Relation Age of Onset  . Anemia Mother   . Anemia Sister   . Birth defects Brother        heart disorder?  Marland Kitchen Hypertension Maternal Grandmother   . Diabetes Maternal Grandmother   . Hypertension Maternal Grandfather   . Diabetes Maternal Grandfather   . Hypertension Paternal Grandmother   . Hypertension Paternal Grandfather   . Diabetes Father     Social History   Tobacco Use  . Smoking status: Never Smoker  . Smokeless tobacco: Never Used  Vaping Use  . Vaping Use: Never used  Substance Use Topics  . Alcohol use: No  . Drug use: No    Allergies: No Known Allergies  Medications Prior to Admission  Medication Sig Dispense Refill Last Dose  . ACCU-CHEK GUIDE test strip      . Accu-Chek Softclix Lancets lancets as directed.     . ferrous sulfate 325 (65 FE) MG tablet Take 1 tablet (325 mg total) by mouth 2 (two) times daily with a meal. 60 tablet 0   . labetalol (NORMODYNE) 200 MG tablet Take 2 tablets (400 mg total) by mouth 2 (two) times daily for 25 days. 100 tablet 0   . metFORMIN (GLUCOPHAGE) 500 MG tablet Take 1 tablet (500 mg total) by mouth 2 (two) times daily with a meal. 30 tablet 0   . NIFEdipine (ADALAT CC) 60 MG 24 hr tablet Take 1 tablet (60 mg total) by mouth daily. 30 tablet 0   . Prenatal Vit-Fe Fumarate-FA (PRENATAL VITAMIN PO) Take 1 tablet by mouth daily.        Review of Systems  Constitutional: Negative.   Negative for fatigue and fever.  HENT: Negative.   Respiratory: Negative.  Negative for shortness of breath.   Cardiovascular: Negative.  Negative for chest pain.  Gastrointestinal: Negative.  Negative for abdominal pain, constipation, diarrhea, nausea and vomiting.  Genitourinary: Positive for vaginal bleeding. Negative for dysuria and vaginal discharge.  Neurological: Negative.  Negative for dizziness and headaches.   Physical Exam   Blood pressure (!) 165/109, pulse (!) 112, temperature 98.3 F (36.8 C), temperature source Oral, resp. rate 18, height 5\' 2"  (1.575 m), weight 129 kg, last menstrual period 01/27/2020, SpO2 100 %, unknown if currently breastfeeding.  Physical Exam Vitals and nursing note reviewed.  Constitutional:      General: She is not in acute distress.    Appearance: She is well-developed.  HENT:     Head: Normocephalic.  Eyes:     Pupils: Pupils are equal, round, and reactive to light.  Cardiovascular:     Rate and Rhythm: Normal rate and regular rhythm.     Heart sounds: Normal heart sounds.  Pulmonary:     Effort: Pulmonary effort is normal. No respiratory distress.     Breath sounds: Normal breath sounds.  Abdominal:     General: Bowel sounds are normal. There is no distension.     Palpations: Abdomen is soft.     Tenderness: There is no abdominal tenderness.  Skin:    General: Skin is warm and dry.  Neurological:     Mental Status: She is alert and oriented to person, place, and time.  Psychiatric:        Behavior: Behavior normal.        Thought Content: Thought content normal.        Judgment: Judgment normal.     MAU Course  Procedures Results for orders placed or performed during the hospital encounter of 04/11/20 (from the past 24 hour(s))  Pregnancy, urine POC     Status: Abnormal   Collection Time: 04/11/20  8:50 AM  Result Value Ref Range   Preg Test, Ur POSITIVE (A) NEGATIVE  CBC     Status: Abnormal   Collection Time: 04/11/20   9:01 AM  Result Value Ref Range   WBC 7.7 4.0 - 10.5 K/uL   RBC 4.96 3.87 - 5.11 MIL/uL   Hemoglobin 11.6 (L) 12.0 - 15.0 g/dL   HCT 04/13/20 16.1 - 09.6 %   MCV 73.6 (L) 80.0 -  100.0 fL   MCH 23.4 (L) 26.0 - 34.0 pg   MCHC 31.8 30.0 - 36.0 g/dL   RDW 76.1 (H) 60.7 - 37.1 %   Platelets 312 150 - 400 K/uL   nRBC 0.0 0.0 - 0.2 %  hCG, quantitative, pregnancy     Status: Abnormal   Collection Time: 04/11/20  9:01 AM  Result Value Ref Range   hCG, Beta Chain, Quant, S 44,263 (H) <5 mIU/mL   US OB LESS THAN 14 WEEKS WITH OB TRANSVAGINAL  Result Date: 04/11/2020 CLINICAL DATA:  30 year old pregnant female with vaginal bleeding. Estimated gestational age of [redacted] weeks 5 days by LMP. Beta HCG of 44,000 EXAM: OBSTETRIC <14 WK [place] AND TRANSVAGINAL OB US TECHNIQUE: Both transabdominal and transvaginal ultrasound examinations were performed for complete evaluation of the gestation as well as the maternal uterus, adnexal regions, and pelvic cul-de-sac. Transvaginal technique was performed to assess early pregnancy. COMPARISON:  None. FINDINGS: Intrauterine gestational sac: Single Yolk sac:  Visualized. Embryo:  Visualized. Cardiac Activity: Visualized. Heart Rate: 175 bpm CRL:  36.8 mm   10 w   4 d                  [place] EDC: 11/03/2020 Subchorionic hemorrhage:  A small subchronic hemorrhage is noted. Maternal uterus/adnexae: The ovaries are unremarkable. No adnexal mass or free fluid. IMPRESSION: 1. Single living intrauterine gestation with estimated gestational age of [redacted] weeks 4 days by this ultrasound. 2. Small subchorionic hemorrhage. Electronically Signed   By: Harmon Pier M.D.   On: 04/11/2020 11:00    MDM UA, UPT CBC, HCG ABO/Rh- A pos Wet prep and gc/chlamydia US OB Comp Less 14 weeks with Transvaginal  Results reviewed at length and discussed expectations for bleeding with subchorionic hemorrhage. lenghty discussion with patient about importance of blood pressure control in pregnancy and will restart  labetalol 200mg  BID today.   Assessment and Plan   1. Normal intrauterine pregnancy on prenatal ultrasound in first trimester   2. Vaginal bleeding affecting early pregnancy   3. [redacted] weeks gestation of pregnancy   4. Subchorionic hemorrhage of placenta in first trimester, single or unspecified fetus   5. Bacterial vaginosis   6. Chronic hypertension affecting pregnancy    -Discharge home in stable condition -Rx for metronidazole sent to patient's pharmacy -First trimester precautions discussed -Patient advised to follow-up with OB as scheduled for prenatal care -Patient may return to MAU as needed or if her condition were to change or worsen   CNM 04/11/2020, 9:19 AM

## 2020-04-13 LAB — GC/CHLAMYDIA PROBE AMP (~~LOC~~) NOT AT ARMC
Chlamydia: NEGATIVE
Comment: NEGATIVE
Comment: NORMAL
Neisseria Gonorrhea: NEGATIVE

## 2020-04-14 ENCOUNTER — Inpatient Hospital Stay (HOSPITAL_COMMUNITY)
Admission: AD | Admit: 2020-04-14 | Discharge: 2020-04-14 | Disposition: A | Discharge status: Home / Self Care | Payer: Medicaid Other | Attending: Obstetrics & Gynecology | Admitting: Obstetrics & Gynecology

## 2020-04-14 ENCOUNTER — Encounter (HOSPITAL_COMMUNITY): Payer: Self-pay | Admitting: Obstetrics & Gynecology

## 2020-04-14 DIAGNOSIS — O468X1 Other antepartum hemorrhage, first trimester: Secondary | ICD-10-CM

## 2020-04-14 DIAGNOSIS — Z3A11 11 weeks gestation of pregnancy: Secondary | ICD-10-CM | POA: Diagnosis not present

## 2020-04-14 DIAGNOSIS — O10911 Unspecified pre-existing hypertension complicating pregnancy, first trimester: Secondary | ICD-10-CM

## 2020-04-14 DIAGNOSIS — O208 Other hemorrhage in early pregnancy: Secondary | ICD-10-CM | POA: Diagnosis not present

## 2020-04-14 DIAGNOSIS — O10011 Pre-existing essential hypertension complicating pregnancy, first trimester: Secondary | ICD-10-CM | POA: Diagnosis not present

## 2020-04-14 DIAGNOSIS — R109 Unspecified abdominal pain: Secondary | ICD-10-CM | POA: Insufficient documentation

## 2020-04-14 DIAGNOSIS — O368390 Maternal care for abnormalities of the fetal heart rate or rhythm, unspecified trimester, not applicable or unspecified: Secondary | ICD-10-CM

## 2020-04-14 DIAGNOSIS — O418X10 Other specified disorders of amniotic fluid and membranes, first trimester, not applicable or unspecified: Secondary | ICD-10-CM

## 2020-04-14 DIAGNOSIS — O209 Hemorrhage in early pregnancy, unspecified: Secondary | ICD-10-CM

## 2020-04-14 DIAGNOSIS — O10919 Unspecified pre-existing hypertension complicating pregnancy, unspecified trimester: Secondary | ICD-10-CM

## 2020-04-14 NOTE — MAU Provider Note (Addendum)
History     CSN: [id number]  Arrival date and time: 04/14/20 1214   Event Date/Time   First Provider Initiated Contact with Patient 04/14/20 1259      Chief Complaint  Patient presents with   Vaginal Bleeding   Abdominal Pain   HPI Marilyn Cooper is a 30 y.o. [id number] at [redacted]w[redacted]d who presents with abdominal pain & vaginal bleeding. Was seen in MAU over the weekend & diagnosed with a subchorionic hemorrhage. Reports bleeding has increased since that visit. Has passed several ~3 cm clots. Passed a large clot in MAU & reports marked improvement in abdominal cramping. Rates cramping 5/10. Hasn't treated symptoms. Nothing makes better or worse.  Has chronic hypertension & was started on labetalol 200 BID on Saturday. Has been taking her medication but had not taken her dose this morning. Has been checking her BP daily since starting meds & reports normal readings at home. Denies headache, cp, sob, s/s stroke.  Has not started prenatal care yet but plans on going to [facility].   OB History     Gravida  7   Para  3   Term  1   Preterm  2   AB  3   Living  3      SAB      IAB  3   Ectopic      Multiple  0   Live Births  3           Past Medical History:  Diagnosis Date   Anemia    Anxiety    Asthma    childhood   Chronic hypertension    Depression    see social work consult, doing really well right now 'is in a good place" 3/29   Gestational diabetes    Hx of varicella    Obese     Past Surgical History:  Procedure Laterality Date   BLADDER REPAIR N/A 08/29/2019   Procedure: BLADDER REPAIR;  Surgeon: Waynard Reeds, MD;  Location: [facility];  Service: Gynecology;  Laterality: N/A;   CESAREAN SECTION N/A 03/09/2012   Procedure: CESAREAN SECTION;  Surgeon: Freddrick March. Tenny Craw, MD;  Location: [facility];  Service: Obstetrics;  Laterality: N/A;   CESAREAN SECTION N/A 12/03/2014   Procedure: CESAREAN SECTION;  Surgeon: Marlow Baars, MD;  Location: [facility];  Service: Obstetrics;   Laterality: N/A;   CESAREAN SECTION N/A 08/29/2019   Procedure: CESAREAN SECTION;  Surgeon: Waynard Reeds, MD;  Location: [facility];  Service: Obstetrics;  Laterality: N/A;   COLPOSCOPY     CYSTOSCOPY N/A 08/29/2019   Procedure: CYSTOSCOPY;  Surgeon: Waynard Reeds, MD;  Location: [facility];  Service: Gynecology;  Laterality: N/A;   HERNIA REPAIR     LAPAROTOMY N/A 08/29/2019   Procedure: EXPLORATORY LAPAROTOMY;  Surgeon: Waynard Reeds, MD;  Location: [facility];  Service: Gynecology;  Laterality: N/A;   TONSILLECTOMY     UMBILICAL HERNIA REPAIR N/A 06/21/2014   Procedure: EXPLORATORY LAPAROTOMY,  SMALL BOWEL RESECTION, PRIMARY REPAIR INCARCERATED VENTRAL HERNIA;  Surgeon: Darnell Level, MD;  Location: [facility];  Service: General;  Laterality: N/A;    Family History  Problem Relation Age of Onset   Anemia Mother    Anemia Sister    Birth defects Brother        heart disorder?   Hypertension Maternal Grandmother    Diabetes Maternal Grandmother    Hypertension Maternal Grandfather    Diabetes Maternal Grandfather    Hypertension  Paternal Grandmother    Hypertension Paternal Grandfather    Diabetes Father     Social History   Tobacco Use   Smoking status: Never Smoker   Smokeless tobacco: Never Used  Building services engineer Use: Never used  Substance Use Topics   Alcohol use: No   Drug use: No    Allergies: No Known Allergies  Medications Prior to Admission  Medication Sig Dispense Refill Last Dose   ACCU-CHEK GUIDE test strip       Accu-Chek Softclix Lancets lancets as directed.      ferrous sulfate 325 (65 FE) MG tablet Take 1 tablet (325 mg total) by mouth 2 (two) times daily with a meal. 60 tablet 0    labetalol (NORMODYNE) 200 MG tablet Take 2 tablets (400 mg total) by mouth 2 (two) times daily for 25 days. 100 tablet 0    metFORMIN (GLUCOPHAGE) 500 MG tablet Take 1 tablet (500 mg total) by mouth 2 (two) times daily with a meal. 30 tablet 0    metroNIDAZOLE (FLAGYL) 500 MG tablet  Take 1 tablet (500 mg total) by mouth 2 (two) times daily. 14 tablet 0    Prenatal Vit-Fe Fumarate-FA (PRENATAL VITAMIN PO) Take 1 tablet by mouth daily.        Review of Systems  Constitutional: Negative.   Respiratory: Negative.   Cardiovascular: Negative.   Gastrointestinal: Positive for abdominal pain.  Genitourinary: Positive for vaginal bleeding.  Neurological: Negative.    Physical Exam   Blood pressure (!) 175/96, pulse 99, temperature 98.2 F (36.8 C), temperature source Oral, resp. rate 20, height 5\' 2"  (1.575 m), weight 130 kg, last menstrual period 01/27/2020, unknown if currently breastfeeding.  Physical Exam Vitals and nursing note reviewed. Exam conducted with a chaperone present.  Constitutional:      General: She is not in acute distress.    Appearance: She is well-developed. She is obese.  HENT:     Head: Normocephalic and atraumatic.  Cardiovascular:     Heart sounds: Normal heart sounds.  Pulmonary:     Effort: Pulmonary effort is normal. No respiratory distress.     Breath sounds: Normal breath sounds.  Abdominal:     Tenderness: There is no abdominal tenderness.  Genitourinary:    Exam position: Lithotomy position.     Comments: Several clots & ~30 ml of blood removed from vagina. No active bleeding after blood products removed. Cervix closed.  Neurological:     Mental Status: She is alert.     MAU Course  Procedures No results found for this or any previous visit (from the past 24 hour(s)).  MDM RN unable to doppler FHT. BSUS performed. Active fetus with + cardiac activity. Unable to determine rate but appeared to be >100. Exam difficult due to maternal body habitus.   Pt informed that the ultrasound is considered a limited OB ultrasound and is not intended to be a complete ultrasound exam.  Patient also informed that the ultrasound is not being completed with the intent of assessing for fetal or placental anomalies or any pelvic abnormalities.   Explained that the purpose of today's ultrasound is to assess for  viability.  Patient acknowledges the purpose of the exam and the limitations of the study.  Active fetus. + cardiac activity but unable to determine rate   RH positive  Discussed with patient that bleeding is excessive considering small SCH. Unsure if bleeding is due to [facility] or possible miscarriage but at this  time cervix is closed & fetus is still alive. Discussed bleeding precautions & reasons to return to MAU  Patient has CHTN. Just started on meds Saturday but has not taken her morning dose yet today. Reports BPs 120s/80s at home since starting the medication. Elevated reading in MAU - possibly due to missed dose vs anxiety r/t pain & concern for miscarriage. She is asymptomatic. Will not adjust medication dosing at this time since she's been normotensive at home. She has f/u with [facility]. Encouraged to take her medication as soon as she gets home.  Assessment and Plan   1. Vaginal bleeding in pregnancy, first trimester   2. Subchorionic hematoma in first trimester, single or unspecified fetus   3. Chronic hypertension in pregnancy   4. [redacted] weeks gestation of pregnancy   5. Unable to hear fetal heart tones as reason for ultrasound scan    -reviewed SAB precautions -pelvic rest -f/u with [facility] -continue labetalol  Judeth Horn 04/14/2020, 12:59 PM   Attestation of Attending Supervision of Advanced Practice Provider (PA/CNM/NP): Evaluation and management procedures were performed by the Advanced Practice Provider under my supervision and collaboration.  I have reviewed the Advanced Practice Provider's note and chart, and I agree with the management and plan. I have also made any necessary editorial changes.   Sharon Seller, DO Attending Obstetrician & Gynecologist, [facility] for [facility], [facility] 04/15/2020 2:58 PM

## 2020-04-14 NOTE — MAU Note (Signed)
States has better understanding and is feeling better now.

## 2020-04-14 NOTE — MAU Note (Signed)
The clot pt passed in toilet, was just a clot, no fetus noted.

## 2020-04-14 NOTE — Discharge Instructions (Signed)
Subchorionic Hematoma  A hematoma is a collection of blood outside of the blood vessels. A subchorionic hematoma is a collection of blood between the outer wall of the embryo (chorion) and the inner wall of the uterus. This condition can cause vaginal bleeding. Early small hematomas usually shrink on their own and do not affect your baby or pregnancy. When bleeding starts later in pregnancy, or if the hematoma is larger or occurs in older pregnant women, the condition may be more serious. Larger hematomas increase the chances of miscarriage. This condition also increases the risk of:  Premature separation of the placenta from the uterus.  Premature (preterm) labor.  Stillbirth. What are the causes? The exact cause of this condition is not known. It occurs when blood is trapped between the placenta and the uterine wall because the placenta has separated from the original site of implantation. What increases the risk? You are more likely to develop this condition if:  You were treated with fertility medicines.  You became pregnant through in vitro fertilization (IVF). What are the signs or symptoms? Symptoms of this condition include:  Vaginal spotting or bleeding.  Abdominal pain. This is rare. Sometimes you may have no symptoms and the bleeding may only be seen when ultrasound images are taken (transvaginal ultrasound). How is this diagnosed? This condition is diagnosed based on a physical exam. This includes a pelvic exam. You may also have other tests, including:  Blood tests.  Urine tests.  Ultrasound of the abdomen. How is this treated? Treatment for this condition can vary. Treatment may include:  Watchful waiting. You will be monitored closely for any changes in bleeding.  Medicines.  Activity restriction. This may be needed until the bleeding stops.  A medicine called Rh immunoglobulin. This is given if you have an Rh-negative blood type. It prevents Rh  sensitization. Follow these instructions at home:  Stay on bed rest if told to do so by your health care provider.  Do not lift anything that is heavier than 10 lb (4.5 kg), or the limit that you are told by your health care provider.  Track and write down the number of pads you use each day and how soaked (saturated) they are.  Do not use tampons.  Keep all follow-up visits. This is important. Your health care provider may ask you to have follow-up blood tests or ultrasound tests or both. Contact a health care provider if:  You have any vaginal bleeding.  You have a fever. Get help right away if:  You have severe cramps in your stomach, back, abdomen, or pelvis.  You pass large clots or tissue. Save any tissue for your health care provider to look at.  You faint.  You become light-headed or weak. Summary  A subchorionic hematoma is a collection of blood between the outer wall of the embryo (chorion) and the inner wall of the uterus.  This condition can cause vaginal bleeding.  Sometimes you may have no symptoms and the bleeding may only be seen when ultrasound images are taken.  Treatment may include watchful waiting, medicines, or activity restriction.  Keep all follow-up visits. Get help right away if you have severe cramps or heavy vaginal bleeding. This information is not intended to replace advice given to you by your health care provider. Make sure you discuss any questions you have with your health care provider. Document Revised: 09/30/2019 Document Reviewed: 09/30/2019 Elsevier Patient Education  2021 Elsevier Inc. Threatened Miscarriage A threatened miscarriage occurs when  a woman has vaginal bleeding during the first 20 weeks of pregnancy but the pregnancy has not ended. If vaginal bleeding occurs during this time, the health care provider will do tests to make sure the woman is still pregnant. The woman's condition may be considered a threatened miscarriage if  the tests show:  That she is still pregnant.  That the embryo or unborn baby (fetus) inside the uterus is still growing. A threatened miscarriage does not mean your pregnancy will end, but it does increase the risk of losing your pregnancy (miscarriage). What are the causes? The cause of this condition is usually not known. What increases the risk? The following factors may make a pregnant woman more likely to have a miscarriage: Certain medical conditions  Conditions that affect the hormone balance in the body, such as thyroid disease or polycystic ovary syndrome.  Diabetes.  Autoimmune disorders.  Infections.  Bleeding disorders.  Obesity. Lifestyle factors  Using products with tobacco or nicotine or being exposed to tobacco smoke.  Having alcohol.  Having large amounts of caffeine.  Recreational drug use. Problems with reproductive organs or structures  Cervical insufficiency. This is when the the lowest part of the uterus (cervix) opens and thins before pregnancy is at term.  Having a condition called Asherman syndrome, which causes scarring in the uterus or causes the uterus to be abnormal in structure.  Fibrous growths, called fibroids, in the uterus.  Congenital abnormalities. These problems are present at birth.  Infection of the cervix or uterus. Personal or medical history  Injury (trauma).  Having had a miscarriage before.  Being younger than age 69 or older than age 19.  Exposure to harmful substances in the environment. This may include radiation or heavy metals, such as lead.  Using certain medicines. What are the signs or symptoms? Symptoms of this condition include:  Vaginal bleeding or spotting, with or without cramps or pain.  Mild pain or cramps in your abdomen. How is this diagnosed? You may have tests to check whether you are still pregnant. These tests will be done if you have bleeding, with or without pain, in your abdomen before the  20th week of pregnancy. These tests include:  Ultrasound.  A physical exam.  Measurement of your baby's heart rate.  Lab tests, such as blood tests, urine tests, or swabs for infection. You may be diagnosed with a threatened miscarriage if:  Ultrasound testing shows that you are still pregnant.  Your baby's heart rate is strong.  A physical exam shows that your cervix is closed.  Blood tests confirm that you are still pregnant.   How is this treated? No treatments have been shown to prevent a threatened miscarriage from going on to a complete miscarriage. However, the right home care is important. Follow these instructions at home:  Get plenty of rest.  Do not have sex, douche, or put anything in your vagina, such as tampons, until your health care provider says it is okay.  Do not smoke or use recreational drugs.  Do not drink alcohol.  Avoid caffeine.  Keep all follow-up prenatal visits. This is important. Contact a health care provider if:  You have light vaginal bleeding or spotting while pregnant.  You have pain or cramping in your abdomen.  You have a fever. Get help right away if:  Heavy bleeding soaks through 2 large sanitary pads an hour for more than 2 hours.  Blood clots come out of your vagina.  Tissue comes out  of your vagina.  You leak fluid, or you have a gush of fluid from your vagina.  You have severe low back pain or cramps in your abdomen.  You have a fever, chills, and severe pain in the abdomen. Summary  A threatened miscarriage occurs when a woman bleeds from the vagina during the first 20 weeks of pregnancy but the pregnancy has not ended.  The cause of a threatened miscarriage is usually not known.  Symptoms of this condition may include vaginal bleeding and mild pain or cramps in your abdomen.  No treatments have been shown to prevent a threatened miscarriage from going on to a complete miscarriage.  Keep all follow-up prenatal  visits. This is important. This information is not intended to replace advice given to you by your health care provider. Make sure you discuss any questions you have with your health care provider. Document Revised: 07/05/2019 Document Reviewed: 07/05/2019 Elsevier Patient Education  2021 [employer].

## 2020-04-14 NOTE — MAU Note (Signed)
Passed a large clot here when in bathroom, some of the pain has eased now. Sm/mod bleeding noted on pad. Pt threw up antibiotic this morning so did not take BP med. Pt a little calmer now.

## 2020-04-14 NOTE — MAU Note (Signed)
Was here on Sat for bleeding.  Dx with subchorionic hemorrhage.  Bleeding increased yesterday, passing clots, clots are getting bigger, is really scared.

## 2020-04-15 ENCOUNTER — Inpatient Hospital Stay (HOSPITAL_COMMUNITY)
Admission: AD | Admit: 2020-04-15 | Discharge: 2020-04-15 | Disposition: A | Discharge status: Home / Self Care | Payer: Medicaid Other | Attending: Obstetrics and Gynecology | Admitting: Obstetrics and Gynecology

## 2020-04-15 DIAGNOSIS — Z3A10 10 weeks gestation of pregnancy: Secondary | ICD-10-CM

## 2020-04-15 DIAGNOSIS — R109 Unspecified abdominal pain: Secondary | ICD-10-CM | POA: Diagnosis not present

## 2020-04-15 DIAGNOSIS — Z79899 Other long term (current) drug therapy: Secondary | ICD-10-CM | POA: Insufficient documentation

## 2020-04-15 DIAGNOSIS — Z3A11 11 weeks gestation of pregnancy: Secondary | ICD-10-CM | POA: Insufficient documentation

## 2020-04-15 DIAGNOSIS — O039 Complete or unspecified spontaneous abortion without complication: Secondary | ICD-10-CM | POA: Insufficient documentation

## 2020-04-15 DIAGNOSIS — R42 Dizziness and giddiness: Secondary | ICD-10-CM | POA: Insufficient documentation

## 2020-04-15 LAB — CBC WITH DIFFERENTIAL/PLATELET
Abs Immature Granulocytes: 0.07 10*3/uL (ref 0.00–0.07)
Basophils Absolute: 0 10*3/uL (ref 0.0–0.1)
Basophils Relative: 0 %
Eosinophils Absolute: 0 10*3/uL (ref 0.0–0.5)
Eosinophils Relative: 0 %
HCT: 30 % — ABNORMAL LOW (ref 36.0–46.0)
Hemoglobin: 9.5 g/dL — ABNORMAL LOW (ref 12.0–15.0)
Immature Granulocytes: 1 %
Lymphocytes Relative: 20 %
Lymphs Abs: 2.7 10*3/uL (ref 0.7–4.0)
MCH: 23.6 pg — ABNORMAL LOW (ref 26.0–34.0)
MCHC: 31.7 g/dL (ref 30.0–36.0)
MCV: 74.6 fL — ABNORMAL LOW (ref 80.0–100.0)
Monocytes Absolute: 0.8 10*3/uL (ref 0.1–1.0)
Monocytes Relative: 6 %
Neutro Abs: 10.2 10*3/uL — ABNORMAL HIGH (ref 1.7–7.7)
Neutrophils Relative %: 73 %
Platelets: 433 10*3/uL — ABNORMAL HIGH (ref 150–400)
RBC: 4.02 MIL/uL (ref 3.87–5.11)
RDW: 16.3 % — ABNORMAL HIGH (ref 11.5–15.5)
WBC: 13.8 10*3/uL — ABNORMAL HIGH (ref 4.0–10.5)
nRBC: 0 % (ref 0.0–0.2)

## 2020-04-15 LAB — COMPREHENSIVE METABOLIC PANEL
ALT: 14 U/L (ref 0–44)
AST: 16 U/L (ref 15–41)
Albumin: 3 g/dL — ABNORMAL LOW (ref 3.5–5.0)
Alkaline Phosphatase: 67 U/L (ref 38–126)
Anion gap: 10 (ref 5–15)
BUN: 6 mg/dL (ref 6–20)
CO2: 19 mmol/L — ABNORMAL LOW (ref 22–32)
Calcium: 8.9 mg/dL (ref 8.9–10.3)
Chloride: 108 mmol/L (ref 98–111)
Creatinine, Ser: 0.8 mg/dL (ref 0.44–1.00)
GFR, Estimated: >60 mL/min (ref >60)
Glucose, Bld: 183 mg/dL — ABNORMAL HIGH (ref 70–99)
Potassium: 3.4 mmol/L — ABNORMAL LOW (ref 3.5–5.1)
Sodium: 137 mmol/L (ref 135–145)
Total Bilirubin: 0.2 mg/dL — ABNORMAL LOW (ref 0.3–1.2)
Total Protein: 6.8 g/dL (ref 6.5–8.1)

## 2020-04-15 LAB — PREPARE RBC (CROSSMATCH)

## 2020-04-15 MED ORDER — MISOPROSTOL 200 MCG PO TABS
ORAL_TABLET | ORAL | Status: AC
  Administered 2020-04-15: 600 ug via RECTAL
  Filled 2020-04-15: qty 4

## 2020-04-15 MED ORDER — KETOROLAC TROMETHAMINE 60 MG/2ML IM SOLN: 60.0000 mg | Freq: Once | INTRAMUSCULAR | Status: AC

## 2020-04-15 MED ORDER — SODIUM CHLORIDE 0.9% IV SOLUTION: Freq: Once | INTRAVENOUS | Status: DC

## 2020-04-15 MED ORDER — IBUPROFEN 800 MG PO TABS
800.0000 mg | ORAL_TABLET | Freq: Three times a day (TID) | ORAL | 0 refills | Status: DC | PRN
Start: 2020-04-15 — End: 2020-10-06

## 2020-04-15 MED ORDER — OXYCODONE-ACETAMINOPHEN 5-325 MG PO TABS: 1.0000 | ORAL_TABLET | ORAL | 0 refills | Status: DC | PRN

## 2020-04-15 MED ORDER — MISOPROSTOL 200 MCG PO TABS: 600.0000 ug | ORAL_TABLET | Freq: Once | ORAL | Status: AC

## 2020-04-15 MED ORDER — LACTATED RINGERS IV BOLUS
1000.0000 mL | Freq: Once | INTRAVENOUS | Status: AC
  Administered 2020-04-15: 1000 mL via INTRAVENOUS

## 2020-04-15 MED ORDER — KETOROLAC TROMETHAMINE 30 MG/ML IJ SOLN
INTRAMUSCULAR | Status: AC
  Administered 2020-04-15: 60 mg via INTRAMUSCULAR
  Filled 2020-04-15: qty 1

## 2020-04-15 MED ORDER — OXYCODONE-ACETAMINOPHEN 5-325 MG PO TABS
1.0000 | ORAL_TABLET | Freq: Once | ORAL | Status: AC
  Administered 2020-04-15: 1 via ORAL
  Filled 2020-04-15: qty 1

## 2020-04-15 NOTE — MAU Note (Signed)
Pt reports feeling some cramping after receiving cytotec. Scant blood seen on yellow pad.

## 2020-04-15 NOTE — MAU Note (Signed)
Pt able to tolerate standing and reports feeling much better. Pain 6/10 upon discharge.

## 2020-04-15 NOTE — MAU Provider Note (Signed)
History     CSN: [id number]  Arrival date and time: 04/15/20 1708   None     No chief complaint on file.  HPI  Marilyn Cooper is a 30 y.o. female [id number] @ [redacted]w[redacted]d here with heavy vaginal bleeding and miscarriage. She is approximately [redacted] weeks gestation.  She was diagnosed with subchorionic hemorrhage in MAU and was seen last in MAU yesterday for abdominal pain and vaginal bleeding. Baby was alive yesterday in MAU.  She reports acute onset of heavy bleeding, abdominal pain, and passing of large clots. She reports having the feeling of passing out. Reports lightheadedness with just sitting.   Of note the patient was taken from the lobby area in a wheelchair and a trail of bright red blood was visualized by staff.   OB History    Gravida  7   Para  3   Term  1   Preterm  2   AB  3   Living  3     SAB      IAB  3   Ectopic      Multiple  0   Live Births  3           Past Medical History:  Diagnosis Date  . Anemia   . Anxiety   . Asthma    childhood  . Chronic hypertension   . Depression    see social work consult, doing really well right now 'is in a good place" 3/29  . Gestational diabetes   . Hx of varicella   . Obese     Past Surgical History:  Procedure Laterality Date  . BLADDER REPAIR N/A 08/29/2019   Procedure: BLADDER REPAIR;  Surgeon: Waynard Reeds, MD;  Location: [facility] ORS;  Service: Gynecology;  Laterality: N/A;  . CESAREAN SECTION N/A 03/09/2012   Procedure: CESAREAN SECTION;  Surgeon: Freddrick March. Tenny Craw, MD;  Location: [facility] ORS;  Service: Obstetrics;  Laterality: N/A;  . CESAREAN SECTION N/A 12/03/2014   Procedure: CESAREAN SECTION;  Surgeon: Marlow Baars, MD;  Location: [facility] ORS;  Service: Obstetrics;  Laterality: N/A;  . CESAREAN SECTION N/A 08/29/2019   Procedure: CESAREAN SECTION;  Surgeon: Waynard Reeds, MD;  Location: [facility] ORS;  Service: Obstetrics;  Laterality: N/A;  . COLPOSCOPY    . CYSTOSCOPY N/A 08/29/2019   Procedure: CYSTOSCOPY;   Surgeon: Waynard Reeds, MD;  Location: [facility] LD ORS;  Service: Gynecology;  Laterality: N/A;  . HERNIA REPAIR    . LAPAROTOMY N/A 08/29/2019   Procedure: EXPLORATORY LAPAROTOMY;  Surgeon: Waynard Reeds, MD;  Location: [facility] LD ORS;  Service: Gynecology;  Laterality: N/A;  . TONSILLECTOMY    . UMBILICAL HERNIA REPAIR N/A 06/21/2014   Procedure: EXPLORATORY LAPAROTOMY,  SMALL BOWEL RESECTION, PRIMARY REPAIR INCARCERATED VENTRAL HERNIA;  Surgeon: Darnell Level, MD;  Location: [facility] ORS;  Service: General;  Laterality: N/A;    Family History  Problem Relation Age of Onset  . Anemia Mother   . Anemia Sister   . Birth defects Brother        heart disorder?  Marland Kitchen Hypertension Maternal Grandmother   . Diabetes Maternal Grandmother   . Hypertension Maternal Grandfather   . Diabetes Maternal Grandfather   . Hypertension Paternal Grandmother   . Hypertension Paternal Grandfather   . Diabetes Father     Social History   Tobacco Use  . Smoking status: Never Smoker  . Smokeless tobacco: Never Used  Vaping Use  . Vaping Use: Never used  Substance Use Topics  . Alcohol use: No  . Drug use: No    Allergies: No Known Allergies  Medications Prior to Admission  Medication Sig Dispense Refill Last Dose  . ACCU-CHEK GUIDE test strip      . Accu-Chek Softclix Lancets lancets as directed.     . ferrous sulfate 325 (65 FE) MG tablet Take 1 tablet (325 mg total) by mouth 2 (two) times daily with a meal. 60 tablet 0   . labetalol (NORMODYNE) 200 MG tablet Take 2 tablets (400 mg total) by mouth 2 (two) times daily for 25 days. (Patient taking differently: Take 200 mg by mouth 2 (two) times daily.) 100 tablet 0   . Prenatal Vit-Fe Fumarate-FA (PRENATAL VITAMIN PO) Take 1 tablet by mouth daily.       Results for orders placed or performed during the hospital encounter of 04/15/20 (from the past 48 hour(s))  Type and screen [facility]     Status: None (Preliminary result)   Collection Time: 04/15/20   5:00 PM  Result Value Ref Range   ABO/RH(D) A POS    Antibody Screen PENDING    Sample Expiration      04/18/2020,2359 Performed at [facility], [address], [place], [place] [address]   CBC with Differential/Platelet     Status: Abnormal   Collection Time: 04/15/20  5:31 PM  Result Value Ref Range   WBC 13.8 (H) 4.0 - 10.5 K/uL   RBC 4.02 3.87 - 5.11 MIL/uL   Hemoglobin 9.5 (L) 12.0 - 15.0 g/dL   HCT 44.0 (L) 10.2 - 72.5 %   MCV 74.6 (L) 80.0 - 100.0 fL   MCH 23.6 (L) 26.0 - 34.0 pg   MCHC 31.7 30.0 - 36.0 g/dL   RDW 36.6 (H) 44.0 - 34.7 %   Platelets 433 (H) 150 - 400 K/uL   nRBC 0.0 0.0 - 0.2 %   Neutrophils Relative % 73 %   Neutro Abs 10.2 (H) 1.7 - 7.7 K/uL   Lymphocytes Relative 20 %   Lymphs Abs 2.7 0.7 - 4.0 K/uL   Monocytes Relative 6 %   Monocytes Absolute 0.8 0.1 - 1.0 K/uL   Eosinophils Relative 0 %   Eosinophils Absolute 0.0 0.0 - 0.5 K/uL   Basophils Relative 0 %   Basophils Absolute 0.0 0.0 - 0.1 K/uL   Immature Granulocytes 1 %   Abs Immature Granulocytes 0.07 0.00 - 0.07 K/uL    Comment: Performed at [facility], [address], [place], [place] [address]  Comprehensive metabolic panel     Status: Abnormal   Collection Time: 04/15/20  5:31 PM  Result Value Ref Range   Sodium 137 135 - 145 mmol/L   Potassium 3.4 (L) 3.5 - 5.1 mmol/L   Chloride 108 98 - 111 mmol/L   CO2 19 (L) 22 - 32 mmol/L   Glucose, Bld 183 (H) 70 - 99 mg/dL    Comment: Glucose reference range applies only to samples taken after fasting for at least 8 hours.   BUN 6 6 - 20 mg/dL   Creatinine, Ser 6.38 0.44 - 1.00 mg/dL   Calcium 8.9 8.9 - 75.6 mg/dL   Total Protein 6.8 6.5 - 8.1 g/dL   Albumin 3.0 (L) 3.5 - 5.0 g/dL   AST 16 15 - 41 U/L   ALT 14 0 - 44 U/L   Alkaline Phosphatase 67 38 - 126 U/L   Total Bilirubin  0.2 (L) 0.3 - 1.2 mg/dL   GFR, Estimated >67 >89 mL/min    Comment: (NOTE) Calculated using the CKD-EPI Creatinine Equation (2021)    Anion gap 10 5  - 15    Comment: Performed at [facility], [address], [place], [place] [address]  Prepare RBC (crossmatch)     Status: None   Collection Time: 04/15/20  5:31 PM  Result Value Ref Range   Order Confirmation      ORDER PROCESSED BY BLOOD BANK Performed at [facility], [address], [place], [place] [address]    Review of Systems  Constitutional: Negative for fever.  Gastrointestinal: Positive for abdominal pain.  Genitourinary: Positive for vaginal bleeding.  Neurological: Positive for dizziness and light-headedness.   Physical Exam   Blood pressure 140/78, pulse (!) 104, temperature 98 F (36.7 C), temperature source Rectal, resp. rate 20, last menstrual period 01/27/2020, SpO2 99 %, unknown if currently breastfeeding.  Physical Exam Constitutional:      General: She is in acute distress.     Appearance: Normal appearance. She is obese. She is diaphoretic.  Pulmonary:     Effort: Pulmonary effort is normal.  Genitourinary:    Comments: Vagina -large amount of blood/ large clots in vaginal canal. Ring rocept used to remove clotts, along with 3-4 large swabs saturated.  Cervix - Small clot noted at cervix after clots/debri  removed. No active bleeding noted from os.   Bimanual exam: deferred initially.  Chaperone present for exam.  Skin:    Coloration: Skin is pale.  Neurological:     Mental Status: She is alert and oriented to person, place, and time.  Psychiatric:        Behavior: Behavior normal.     MAU Course  Procedures  MDM  Fetus/ ?POC noted in bowl that patient brought in with her. POC sent to pathology.  A positive blood type LR bolus initiated upon arrival CBC, type and screen and cross match x 2 units.  Cytotec given 600 mcg rectal. Toradol 60 mg IM given Patient significantly better after rectal cytotec and toradol. Her husband is present and sitting with her at bedside. Percocet 1 tablet given orally HR on arrival 130's now 110's,  she was up to the bathroom without dizziness. She has scant/small amount of blood on pad prior to DC home.  Cervix FT and posterior on bimanual exam prior to DC home. No clots felt in vault.  Reviewed HPI, exam, vitals, and labs with Dr. Alysia Penna; he is agreeable with plan of care and DC home.   Assessment and Plan   A:  1. Complete miscarriage   2. [redacted] weeks gestation of pregnancy     P:  Discharge home in stable condition Rx: Percocet, Ibuprofen F/u in 1 week for Quant and 2 weeks with a provider.  Message sent to [facility] for follow up as she has not established with a OB practice yet Strict return precautions Bleeding precautions Continue Iron supplements   Alajia Schmelzer, Harolyn Rutherford, NP 04/15/2020 8:04 PM

## 2020-04-15 NOTE — Discharge Instructions (Signed)
Miscarriage A miscarriage is the loss of a pregnancy before the 20th week of pregnancy. Sometimes, a pregnancy ends before a woman knows that she is pregnant. If you lose a pregnancy, talk with your doctor about:  Questions you have about the loss of your baby.  How to work through your grief.  Plans for future pregnancy. What are the causes? Many times, the cause of this condition is not known. What increases the risk? These things may make a pregnant woman more likely to lose a pregnancy: Certain health conditions  Conditions that affect hormones, such as: ? Thyroid disease. ? Polycystic ovary syndrome.  Diabetes.  A disease that causes the body's disease-fighting system to attack itself by mistake.  Infections.  Bleeding problems.  Being very overweight. Lifestyle factors  Using products that have tobacco or nicotine in them.  Being around tobacco smoke.  Having alcohol.  Having a lot of caffeine.  Using drugs. Problems with reproductive organs or parts  Having a cervix that opens and thins before your due date. The cervix is the lowest part of your womb.  Having Asherman syndrome, which leads to: ? Scars in the womb. ? The womb being abnormal in shape.  Growths (fibroids) in the womb.  Problems in the body that are present at birth.  Infection of the cervix or womb. Personal or health history  Injury.  Having lost a pregnancy before.  Being younger than age 18 or older than age 35.  Being around a harmful substance, such as radiation.  Having lead or other heavy metals in: ? Things you eat or drink. ? The air around you.  Using certain medicines. What are the signs or symptoms?  Blood or spots of blood coming from the vagina. You may also have cramps or pain.  Pain or cramps in the belly or low back.  Fluid or tissue coming out of the vagina. How is this treated? Sometimes, treatment is not needed. If you need treatment, you may be  treated with:  A procedure to open the cervix more and take tissue out of the womb.  Medicines. You may get a shot of medicine called Rho(D) immune globulin. Follow these instructions at home: Medicines  Take over-the-counter and prescription medicines only as told by your doctor.  If you were prescribed antibiotic medicine, take it as told by your doctor. Do not stop taking it even if you start to feel better. Activity  Rest as told by your doctor. Ask your doctor what activities are safe for you.  Have someone help you at home during this time. General instructions  Watch how much tissue comes out of the vagina.  Watch the size of any blood clots that come out of the vagina.  Do not have sex or douche until your doctor says it is okay.  Do not put things, such as tampons, in your vagina until your doctor says it is okay.  To help you and your partner with grieving: ? Talk with your doctor. ? See a counselor.  When you are ready, talk with your doctor about: ? Things to do for your health. ? How you can be healthy if you get pregnant again.  Keep all follow-up visits.   Where to find more information  The American College of Obstetricians and Gynecologists: acog.org  [place] Department of Health and Human Services Office of Women's Health: hrsa.gov/office-womens-health Contact a doctor if:  You have a fever or chills.  There is bad-smelling fluid coming   from the vagina.  You have more bleeding.  Tissue or clots of blood come out of your vagina. Get help right away if:  You have very bad cramps or pain in your back or belly.  You soak more than 2 large pads in an hour for more than 2 hours.  You get light-headed or weak.  You faint.  You feel sad, and you have sad thoughts a lot of the time.  You think about hurting yourself. Get help right awayif you feel like you may hurt yourself or others, or have thoughts about taking your own life. Go to your nearest  emergency room or:  Call your local emergency services (911 in the [place]  Call the National Suicide Prevention Lifeline at 1-800-273-8255. This is open 24 hours a day.  Text the Crisis Text Line at 741741. Summary  A miscarriage is the loss of a pregnancy before the 20th week of pregnancy. Sometimes, a pregnancy ends before a woman knows that she is pregnant.  Follow instructions from your doctor about medicines and activity.  To help you and your partner with grieving, talk with your doctor or a counselor.  Keep all follow-up visits. This information is not intended to replace advice given to you by your health care provider. Make sure you discuss any questions you have with your health care provider. Document Revised: 07/05/2019 Document Reviewed: 07/05/2019 Elsevier Patient Education  2021 Elsevier Inc.  

## 2020-04-15 NOTE — MAU Note (Signed)
Pt presented to registration with large amount of bleeding and passing of clots, tissue and suspected fetus. Pt began this heaving bleeding and cramping at home.  She had been at MAU yesterday for threatened miscarriage. Pt brought straight back to room. Provider at bedside.

## 2020-04-16 LAB — TYPE AND SCREEN
ABO/RH(D): A POS
Antibody Screen: POSITIVE

## 2020-04-17 LAB — SURGICAL PATHOLOGY

## 2020-04-22 ENCOUNTER — Other Ambulatory Visit: Payer: Medicaid Other

## 2020-04-22 ENCOUNTER — Other Ambulatory Visit: Payer: Self-pay

## 2020-04-22 DIAGNOSIS — O021 Missed abortion: Secondary | ICD-10-CM

## 2020-04-23 LAB — BETA HCG QUANT (REF LAB): hCG Quant: 241 m[IU]/mL

## 2020-04-27 ENCOUNTER — Other Ambulatory Visit: Payer: Self-pay | Admitting: *Deleted

## 2020-04-27 DIAGNOSIS — O039 Complete or unspecified spontaneous abortion without complication: Secondary | ICD-10-CM

## 2020-04-29 ENCOUNTER — Ambulatory Visit (INDEPENDENT_AMBULATORY_CARE_PROVIDER_SITE_OTHER): Payer: Medicaid Other | Admitting: Obstetrics and Gynecology

## 2020-04-29 ENCOUNTER — Encounter: Payer: Self-pay | Admitting: Obstetrics and Gynecology

## 2020-04-29 ENCOUNTER — Other Ambulatory Visit: Payer: Self-pay

## 2020-04-29 DIAGNOSIS — R4589 Other symptoms and signs involving emotional state: Secondary | ICD-10-CM

## 2020-04-29 DIAGNOSIS — O039 Complete or unspecified spontaneous abortion without complication: Secondary | ICD-10-CM | POA: Insufficient documentation

## 2020-04-29 HISTORY — DX: Other symptoms and signs involving emotional state: R45.89

## 2020-04-29 NOTE — Progress Notes (Signed)
   Subjective:    Patient ID: Marilyn Cooper, female    DOB: 07/14/1990, 30 y.o.   MRN: [id number]  HPI 30 yo G7P3 seen at [facility] office for SAB follow up.  Per pt she had SAB starting on 04/15/20.  Bleeding continued for 11 days with 9 of those days being heavy.  Pt had been seen in the past by [employer], but did not have care with them this pregnancy.  She is feeling well but does not some feelings of concern/depression regarding the loss.   Review of Systems     Objective:   Physical Exam Vitals:   04/29/20 1015  BP: (!) 160/92  Pulse: (!) 111   Pulm: CTAB CV: RRR Pt declined speculum exam       Assessment & Plan:   1. SAB (spontaneous abortion) Miscarriage appears to have completed Will recheck quant bhcg and h/h due to increased fatigue - CBC - Beta hCG quant (ref lab)  2. Depressed mood Will refer to behavorial health  Pt denies suicidal/homicidal ideation - Ambulatory referral to [facility] Behavior[facility]  F/u 9/22 for AE Pap 2024  Warden Fillers, MD Faculty Attending, [facility]

## 2020-04-29 NOTE — Progress Notes (Signed)
Pt is here for SAB f/u. Pt states she is not bleeding actively. She only noticing when she wipes. She is here today for blood work. Last lab work was on 04/22/20 with levels 241. Pt is still having elevated BP but states she did not take her labatelol until right before her OV this morning. Pt is unsure if she would like to try again for pregnancy so she would like to have a prescription for the pill. Postpartum depression scale: Score 16.

## 2020-04-30 ENCOUNTER — Telehealth: Payer: Self-pay

## 2020-04-30 LAB — CBC
Hematocrit: 27.9 % — ABNORMAL LOW (ref 34.0–46.6)
Hemoglobin: 8.3 g/dL — ABNORMAL LOW (ref 11.1–15.9)
MCH: 22.6 pg — ABNORMAL LOW (ref 26.6–33.0)
MCHC: 29.7 g/dL — ABNORMAL LOW (ref 31.5–35.7)
MCV: 76 fL — ABNORMAL LOW (ref 79–97)
Platelets: 342 10*3/uL (ref 150–450)
RBC: 3.68 x10E6/uL — ABNORMAL LOW (ref 3.77–5.28)
RDW: 16 % — ABNORMAL HIGH (ref 11.7–15.4)
WBC: 8.6 10*3/uL (ref 3.4–10.8)

## 2020-04-30 LAB — BETA HCG QUANT (REF LAB): hCG Quant: 44 m[IU]/mL

## 2020-04-30 NOTE — Telephone Encounter (Signed)
S/w pt and advised of results, pt does desire iron infusion. Advised provider to place orders for scheduling and advised pt that schedulers will call for appts, pt agreed.

## 2020-05-04 ENCOUNTER — Other Ambulatory Visit: Payer: Self-pay | Admitting: Obstetrics and Gynecology

## 2020-05-04 MED ORDER — SODIUM CHLORIDE 0.9 % IV SOLN: 500.0000 mg | Freq: Once | INTRAVENOUS | Status: DC

## 2020-05-04 NOTE — Progress Notes (Signed)
venofer orders placed

## 2020-05-05 ENCOUNTER — Ambulatory Visit (INDEPENDENT_AMBULATORY_CARE_PROVIDER_SITE_OTHER): Payer: Medicaid Other | Admitting: Licensed Clinical Social Worker

## 2020-05-05 DIAGNOSIS — F4321 Adjustment disorder with depressed mood: Secondary | ICD-10-CM

## 2020-05-06 NOTE — BH Specialist Note (Signed)
Integrated Behavioral Health via Telemedicine Visit  05/06/2020 SEJLA MARZANO [id number]  Number of Integrated Behavioral Health visits: 1/6 Session Start time: 2:30pm  Session End time: 3:07pm Total time: 37 mins via mychart video   Referring Provider: Hazle Coca MD Patient/Family location: Home  [facility] Provider location: [facility]  All persons participating in visit: Pt H Drennan and LCSWA A Hinley Brimage Types of Service: Individual psychotherapy and Video visit  I connected with Cianna L Lenzo and/or Kenijah L Wailes's n/a via  Telephone or Video Enabled Telemedicine Application  (Video is Caregility application) and verified that I am speaking with the correct person using two identifiers. Discussed confidentiality: Yes   I discussed the limitations of telemedicine and the availability of in person appointments.  Discussed there is a possibility of technology failure and discussed alternative modes of communication if that failure occurs.  I discussed that engaging in this telemedicine visit, they consent to the provision of behavioral healthcare and the services will be billed under their insurance.  Patient and/or legal guardian expressed understanding and consented to Telemedicine visit: Yes   Presenting Concerns: Patient and/or family reports the following symptoms/concerns: irritability, depressed mood Duration of problem: 3 weeks ; Severity of problem: mild  Patient and/or Family's Strengths/Protective Factors: Concrete supports in place (healthy food, safe environments, etc.)  Goals Addressed: Patient will: 1.  Reduce symptoms of: depression  2.  Increase knowledge and/or ability of: coping skills  3.  Demonstrate ability to: Begin healthy grieving over loss  Progress towards Goals: Ongoing  Interventions: Interventions utilized:  Supportive Counseling Standardized Assessments completed: Not Needed     Assessment: Patient currently experiencing grief associated  with pregnancy loss. Pt reports difficultly with grieving process. Pt advised grief is universal and not one particular way. Encouraged Ms Spurgin to grief the loss of her child in a manner most comfortin to her and suggested enrolling in a support group    Patient may benefit from integrated behavioral health   Plan: 1. Follow up with behavioral health clinician on : 3 weeks via mychart  2. Behavioral recommendations: Memorialized baby when ready, allow time to grieve and encourage support group 3. Referral(s): Integrated [employer] (In Clinic)  I discussed the assessment and treatment plan with the patient and/or parent/guardian. They were provided an opportunity to ask questions and all were answered. They agreed with the plan and demonstrated an understanding of the instructions.   They were advised to call back or seek an in-person evaluation if the symptoms worsen or if the condition fails to improve as anticipated.  Gwyndolyn Saxon, LCSW

## 2020-05-14 ENCOUNTER — Other Ambulatory Visit: Payer: Self-pay

## 2020-05-14 ENCOUNTER — Other Ambulatory Visit: Payer: Medicaid Other

## 2020-05-14 ENCOUNTER — Other Ambulatory Visit: Payer: Self-pay | Admitting: Obstetrics and Gynecology

## 2020-05-14 DIAGNOSIS — O039 Complete or unspecified spontaneous abortion without complication: Secondary | ICD-10-CM

## 2020-05-15 LAB — BETA HCG QUANT (REF LAB): hCG Quant: 2 m[IU]/mL

## 2020-08-05 ENCOUNTER — Encounter (HOSPITAL_COMMUNITY): Payer: Self-pay | Admitting: Emergency Medicine

## 2020-08-05 ENCOUNTER — Emergency Department (HOSPITAL_COMMUNITY): Payer: Medicaid Other

## 2020-08-05 ENCOUNTER — Other Ambulatory Visit: Payer: Self-pay

## 2020-08-05 ENCOUNTER — Emergency Department (HOSPITAL_COMMUNITY)
Admission: EM | Admit: 2020-08-05 | Discharge: 2020-08-05 | Disposition: A | Discharge status: Home / Self Care | Payer: Medicaid Other | Attending: Emergency Medicine | Admitting: Emergency Medicine

## 2020-08-05 DIAGNOSIS — K439 Ventral hernia without obstruction or gangrene: Secondary | ICD-10-CM | POA: Diagnosis not present

## 2020-08-05 DIAGNOSIS — Z79899 Other long term (current) drug therapy: Secondary | ICD-10-CM | POA: Insufficient documentation

## 2020-08-05 DIAGNOSIS — R109 Unspecified abdominal pain: Secondary | ICD-10-CM | POA: Diagnosis present

## 2020-08-05 DIAGNOSIS — I10 Essential (primary) hypertension: Secondary | ICD-10-CM | POA: Insufficient documentation

## 2020-08-05 LAB — CBC WITH DIFFERENTIAL/PLATELET
Abs Immature Granulocytes: 0.03 10*3/uL (ref 0.00–0.07)
Basophils Absolute: 0 10*3/uL (ref 0.0–0.1)
Basophils Relative: 0 %
Eosinophils Absolute: 0 10*3/uL (ref 0.0–0.5)
Eosinophils Relative: 0 %
HCT: 35.3 % — ABNORMAL LOW (ref 36.0–46.0)
Hemoglobin: 10.2 g/dL — ABNORMAL LOW (ref 12.0–15.0)
Immature Granulocytes: 0 %
Lymphocytes Relative: 18 %
Lymphs Abs: 1.2 10*3/uL (ref 0.7–4.0)
MCH: 20.4 pg — ABNORMAL LOW (ref 26.0–34.0)
MCHC: 28.9 g/dL — ABNORMAL LOW (ref 30.0–36.0)
MCV: 70.6 fL — ABNORMAL LOW (ref 80.0–100.0)
Monocytes Absolute: 0.5 10*3/uL (ref 0.1–1.0)
Monocytes Relative: 8 %
Neutro Abs: 5.1 10*3/uL (ref 1.7–7.7)
Neutrophils Relative %: 74 %
Platelets: 337 10*3/uL (ref 150–400)
RBC: 5 MIL/uL (ref 3.87–5.11)
RDW: 17.2 % — ABNORMAL HIGH (ref 11.5–15.5)
WBC: 6.9 10*3/uL (ref 4.0–10.5)
nRBC: 0 % (ref 0.0–0.2)

## 2020-08-05 LAB — COMPREHENSIVE METABOLIC PANEL
ALT: 17 U/L (ref 0–44)
AST: 20 U/L (ref 15–41)
Albumin: 3.8 g/dL (ref 3.5–5.0)
Alkaline Phosphatase: 103 U/L (ref 38–126)
Anion gap: 8 (ref 5–15)
BUN: 10 mg/dL (ref 6–20)
CO2: 19 mmol/L — ABNORMAL LOW (ref 22–32)
Calcium: 9.3 mg/dL (ref 8.9–10.3)
Chloride: 110 mmol/L (ref 98–111)
Creatinine, Ser: 0.68 mg/dL (ref 0.44–1.00)
GFR, Estimated: >60 mL/min (ref >60)
Glucose, Bld: 125 mg/dL — ABNORMAL HIGH (ref 70–99)
Potassium: 3.6 mmol/L (ref 3.5–5.1)
Sodium: 137 mmol/L (ref 135–145)
Total Bilirubin: 0.3 mg/dL (ref 0.3–1.2)
Total Protein: 8.1 g/dL (ref 6.5–8.1)

## 2020-08-05 LAB — I-STAT BETA HCG BLOOD, ED (MC, WL, AP ONLY): I-stat hCG, quantitative: <5 m[IU]/mL (ref <5)

## 2020-08-05 LAB — LIPASE, BLOOD: Lipase: 27 U/L (ref 11–51)

## 2020-08-05 MED ORDER — HYDROCODONE-ACETAMINOPHEN 5-325 MG PO TABS: 1.0000 | ORAL_TABLET | Freq: Four times a day (QID) | ORAL | 0 refills | Status: DC | PRN

## 2020-08-05 MED ORDER — SODIUM CHLORIDE 0.9 % IV BOLUS
1000.0000 mL | Freq: Once | INTRAVENOUS | Status: AC
  Administered 2020-08-05: 1000 mL via INTRAVENOUS

## 2020-08-05 MED ORDER — IOHEXOL 300 MG/ML  SOLN
100.0000 mL | Freq: Once | INTRAMUSCULAR | Status: AC | PRN
  Administered 2020-08-05: 100 mL via INTRAVENOUS

## 2020-08-05 MED ORDER — ONDANSETRON 4 MG PO TBDP: 4.0000 mg | ORAL_TABLET | Freq: Once | ORAL | Status: DC

## 2020-08-05 MED ORDER — ONDANSETRON 4 MG PO TBDP
ORAL_TABLET | ORAL | 0 refills | Status: DC
Start: 2020-08-05 — End: 2020-10-06

## 2020-08-05 MED ORDER — HYDROMORPHONE HCL 1 MG/ML IJ SOLN
1.0000 mg | Freq: Once | INTRAMUSCULAR | Status: DC
Start: 2020-08-05 — End: 2020-08-05

## 2020-08-05 NOTE — ED Provider Notes (Signed)
[facility] EMERGENCY DEPARTMENT Provider Note   CSN: [id number] Arrival date & time: 08/05/20  1349     History Chief Complaint  Patient presents with   Abdominal Pain   Emesis   Nausea    Marilyn Cooper is a 30 y.o. female history of hypertension, here presenting with abdominal pain and vomiting and nausea.  Patient states that for the last week or so she noticed that her incisional hernia became more protuberant.  She noticed some gas there.  She states that for the last 3 days she has not been passing gas and has been nauseated.  Patient also noticed increased abdominal distention.  Patient states that she did have 3 liquid bowel movements today.  No previous history of small bowel obstruction  The history is provided by the patient. No language interpreter was used.      Past Medical History:  Diagnosis Date   Anemia    Anxiety    Asthma    childhood   Chronic hypertension    Depression    see social work consult, doing really well right now 'is in a good place" 3/29   Gestational diabetes    Hx of varicella    Obese     Patient Active Problem List   Diagnosis Date Noted   SAB (spontaneous abortion) 04/29/2020   Depressed mood 04/29/2020   Chronic hypertension with superimposed preeclampsia 08/23/2019   Gestational diabetes 08/23/2019   Depression 10/08/2015   Panic disorder 08/03/2015   Hypertension in pregnancy, transient, antepartum 12/03/2014   Incarcerated umbilical hernia 06/21/2014   Generalized anxiety disorder 02/04/2013   Weight gain 02/04/2013    Past Surgical History:  Procedure Laterality Date   BLADDER REPAIR N/A 08/29/2019   Procedure: BLADDER REPAIR;  Surgeon: Waynard Reeds, MD;  Location: [facility] ORS;  Service: Gynecology;  Laterality: N/A;   CESAREAN SECTION N/A 03/09/2012   Procedure: CESAREAN SECTION;  Surgeon: Freddrick March. Tenny Craw, MD;  Location: [facility] ORS;  Service: Obstetrics;  Laterality: N/A;   CESAREAN SECTION N/A 12/03/2014    Procedure: CESAREAN SECTION;  Surgeon: Marlow Baars, MD;  Location: [facility] ORS;  Service: Obstetrics;  Laterality: N/A;   CESAREAN SECTION N/A 08/29/2019   Procedure: CESAREAN SECTION;  Surgeon: Waynard Reeds, MD;  Location: [facility];  Service: Obstetrics;  Laterality: N/A;   COLPOSCOPY     CYSTOSCOPY N/A 08/29/2019   Procedure: CYSTOSCOPY;  Surgeon: Waynard Reeds, MD;  Location: [facility];  Service: Gynecology;  Laterality: N/A;   HERNIA REPAIR     LAPAROTOMY N/A 08/29/2019   Procedure: EXPLORATORY LAPAROTOMY;  Surgeon: Waynard Reeds, MD;  Location: [facility];  Service: Gynecology;  Laterality: N/A;   TONSILLECTOMY     UMBILICAL HERNIA REPAIR N/A 06/21/2014   Procedure: EXPLORATORY LAPAROTOMY,  SMALL BOWEL RESECTION, PRIMARY REPAIR INCARCERATED VENTRAL HERNIA;  Surgeon: Darnell Level, MD;  Location: [facility] ORS;  Service: General;  Laterality: N/A;     OB History     Gravida  7   Para  3   Term  1   Preterm  2   AB  3   Living  3      SAB      IAB  3   Ectopic      Multiple  0   Live Births  3           Family History  Problem Relation Age of Onset   Anemia Mother    Anemia  Sister    Birth defects Brother        heart disorder?   Hypertension Maternal Grandmother    Diabetes Maternal Grandmother    Hypertension Maternal Grandfather    Diabetes Maternal Grandfather    Hypertension Paternal Grandmother    Hypertension Paternal Grandfather    Diabetes Father     Social History   Tobacco Use   Smoking status: Never   Smokeless tobacco: Never  Vaping Use   Vaping Use: Never used  Substance Use Topics   Alcohol use: No   Drug use: No    Home Medications Prior to Admission medications   Medication Sig Start Date End Date Taking? Authorizing Provider  ACCU-CHEK GUIDE test strip  08/27/19   [provider]  Accu-Chek Softclix Lancets lancets as directed. Patient not taking: Reported on 04/29/2020 08/18/19   [provider]  ferrous sulfate 325 (65  FE) MG tablet Take 1 tablet (325 mg total) by mouth 2 (two) times daily with a meal. 09/01/19   Levi Aland, MD  ibuprofen (ADVIL) 800 MG tablet Take 1 tablet (800 mg total) by mouth every 8 (eight) hours as needed. Patient not taking: Reported on 04/29/2020 04/15/20   Rasch, Victorino Dike I, NP  labetalol (NORMODYNE) 200 MG tablet Take 2 tablets (400 mg total) by mouth 2 (two) times daily for 25 days. Patient taking differently: Take 200 mg by mouth 2 (two) times daily. 09/01/19 09/26/19  Levi Aland, MD  oxyCODONE-acetaminophen (PERCOCET) 5-325 MG tablet Take 1-2 tablets by mouth every 4 (four) hours as needed for severe pain. Patient not taking: Reported on 04/29/2020 04/15/20 04/15/21  Rasch, Victorino Dike I, NP  Prenatal Vit-Fe Fumarate-FA (PRENATAL VITAMIN PO) Take 1 tablet by mouth daily.     [provider]    Allergies    Patient has no known allergies.  Review of Systems   Review of Systems  Gastrointestinal:  Positive for abdominal distention and abdominal pain.  All other systems reviewed and are negative.  Physical Exam Updated Vital Signs BP (!) 141/95   Pulse (!) 101   Temp 99 F (37.2 C) (Oral)   Resp 20   LMP 07/14/2020 Comment: neg preg test  SpO2 100%   Physical Exam Vitals and nursing note reviewed.  Constitutional:      Appearance: She is well-developed.  HENT:     Head: Normocephalic.  Eyes:     Extraocular Movements: Extraocular movements intact.  Cardiovascular:     Rate and Rhythm: Normal rate and regular rhythm.     Heart sounds: Normal heart sounds.  Pulmonary:     Effort: Pulmonary effort is normal.     Breath sounds: Normal breath sounds.  Abdominal:     Comments: Distended.  Patient has vertical abdominal scar with incisional hernia that is easily reducible and appears to be full of gas.  Mild tenderness over the incisional hernia   Skin:    Capillary Refill: Capillary refill takes less than 2 seconds.  Neurological:     General: No focal  deficit present.     Mental Status: She is alert and oriented to person, place, and time.  Psychiatric:        Mood and Affect: Mood normal.        Behavior: Behavior normal.    ED Results / Procedures / Treatments   Labs (all labs ordered are listed, but only abnormal results are displayed) Labs Reviewed  CBC WITH DIFFERENTIAL/PLATELET - Abnormal; Notable for  the following components:      Result Value   Hemoglobin 10.2 (*)    HCT 35.3 (*)    MCV 70.6 (*)    MCH 20.4 (*)    MCHC 28.9 (*)    RDW 17.2 (*)    All other components within normal limits  COMPREHENSIVE METABOLIC PANEL - Abnormal; Notable for the following components:   CO2 19 (*)    Glucose, Bld 125 (*)    All other components within normal limits  LIPASE, BLOOD  I-STAT BETA HCG BLOOD, ED ([facility], [facility], [facility] ONLY)    EKG None  Radiology CT ABDOMEN PELVIS W CONTRAST  Result Date: 08/05/2020 CLINICAL DATA:  Hernia vomiting EXAM: CT ABDOMEN AND PELVIS WITH CONTRAST TECHNIQUE: Multidetector CT imaging of the abdomen and pelvis was performed using the standard protocol following bolus administration of intravenous contrast. CONTRAST:  OMNIPAQUE IOHEXOL 300 MG/ML  SOLN COMPARISON:  None. FINDINGS: Lower chest: Lung bases demonstrate no acute consolidation or effusion. Normal cardiac size. Hepatobiliary: The liver appears enlarged measuring 25 cm craniocaudad. Probable steatosis. Dilated gallbladder without calcified stone. No biliary dilatation Pancreas: Unremarkable. No pancreatic ductal dilatation or surrounding inflammatory changes. Spleen: Enlarged measuring 16 cm craniocaudad Adrenals/Urinary Tract: Adrenal glands are unremarkable. Kidneys are normal, without renal calculi, focal lesion, or hydronephrosis. Bladder is unremarkable. Stomach/Bowel: Stomach nondilated. No dilated small bowel. Fluid within the colon. No acute bowel wall thickening. Focal narrowed appearance at the rectosigmoid colon, series 3 image 68. Negative  appendix Vascular/Lymphatic: Nonaneurysmal aorta.  No suspicious nodes. Reproductive: Uterus and bilateral adnexa are unremarkable. Other: Negative for free air or free fluid. Moderate ventral hernia containing mesenteric fat, small bowel and transverse colon. No obstruction. No edema within the hernia sac to suggest incarceration. Transverse fascial defect measures 7.7 cm. Smaller fat containing ventral hernia cranial to the larger hernia. Musculoskeletal: No acute or suspicious osseous abnormality IMPRESSION: 1. Moderate ventral hernia containing mesenteric fat small bowel and transverse colon but no evidence for obstruction or incarceration. Adjacent smaller fat containing ventral hernia. 2. Hepatosplenomegaly with probable steatosis 3. Focal narrowing at the rectosigmoid colon indeterminate for focal spasm or stricture. Follow-up outpatient colonoscopy may be considered. Electronically Signed   By: Jasmine Pang M.D.   On: 08/05/2020 22:17    Procedures Procedures   Medications Ordered in ED Medications  ondansetron (ZOFRAN-ODT) disintegrating tablet 4 mg (has no administration in time range)  sodium chloride 0.9 % bolus 1,000 mL (1,000 mLs Intravenous New Bag/Given 08/05/20 2202)  iohexol (OMNIPAQUE) 300 MG/ML solution 100 mL (100 mLs Intravenous Contrast Given 08/05/20 2206)    ED Course  I have reviewed the triage vital signs and the nursing notes.  Pertinent labs & imaging results that were available during my care of the patient were reviewed by me and considered in my medical decision making (see chart for details).    MDM Rules/Calculators/A&P                           Ranita L Beneke is a 30 y.o. female history of incisional hernia here presenting with increased size of the hernia and distention.  The hernia is easily reducible.  However her abdomen is distended I wonder if she has small bowel obstruction versus ileus.  Plan to get CT abdomen pelvis and labs   10:39 PM Labs  unremarkable.  CT showed fat-containing ventral hernia with no obvious bowel obstruction.  I talked to Dr. Freida Busman from  surgery.  Since the hernia is reducible and there is no bowel obstruction, she states that this patient can follow-up outpatient with Dr. Sid FalconGerkins   Final Clinical Impression(s) / ED Diagnoses Final diagnoses:  None    Rx / DC Orders ED Discharge Orders     None        Charlynne PanderYao, Marisah Laker Hsienta, MD 08/05/20 2240

## 2020-08-05 NOTE — ED Notes (Signed)
Patient verbalizes understanding of discharge instructions. Prescriptions and follow-up care reviewed. Opportunity for questioning and answers were provided. Armband removed by staff, pt discharged from ED ambulatory.  

## 2020-08-05 NOTE — ED Triage Notes (Signed)
Patient from home, compliant of worsening abdominal pain for two weeks also endorses n/v/d. Pt states she has hernia and has had one in the past and feels the same.

## 2020-08-05 NOTE — ED Provider Notes (Signed)
Emergency Medicine Provider Triage Evaluation Note  Marilyn Cooper , a 30 y.o. female  was evaluated in triage.  Pt complains of ventral, incisional hernia, intermittently painful and protruding for the last 2 weeks.  Last night, she noticed her and her husband were unable to reduce the hernia.  Today, began vomiting whenever she ate something with increased pain to the hernia.  Last food was around 11:30 AM this morning.  Review of Systems  Positive: Hernia, abdominal pain, vomiting Negative: Diarrhea, fever  Physical Exam  BP (!) 159/104   Pulse 99   Temp 98.1 F (36.7 C) (Oral)   Resp 17   LMP 01/27/2020   SpO2 99%  Gen:   Awake, no distress   Resp:  Normal effort  MSK:   Moves extremities without difficulty  Other:  Tenderness around the apparent ventral incisional hernia.  It seems to be able to reduce, however, it also seems to be a rather large defect.  No skin discoloration or increased warmth.  Medical Decision Making  Medically screening exam initiated at 4:21 PM.  Appropriate orders placed.  Marilyn Cooper was informed that the remainder of the evaluation will be completed by another provider, this initial triage assessment does not replace that evaluation, and the importance of remaining in the ED until their evaluation is complete.     Anselm Pancoast, PA-C 08/05/20 1629    Cathren Laine, MD 08/10/20 [phone]

## 2020-08-05 NOTE — Discharge Instructions (Addendum)
You have a hernia that is containing fat right now.   Take Vicodin as needed for pain.  Take Zofran as needed for nausea  Please call your surgeon tomorrow for appointment  You may have some spasms in your colon and you may need a colonoscopy for further evaluation.  Return to ER if you have severe abdominal pain, hernia not reducible, fever, vomiting.

## 2020-08-06 IMAGING — US US MFM FETAL BPP W/O NON-STRESS
1 series · 13 of 28 positions shown · non-contrast
Comparison: none

[Series 1: us mfm fetal bpp w/o non-stress · 28 acquisitions, 13 frames shown]
[im 2/28]
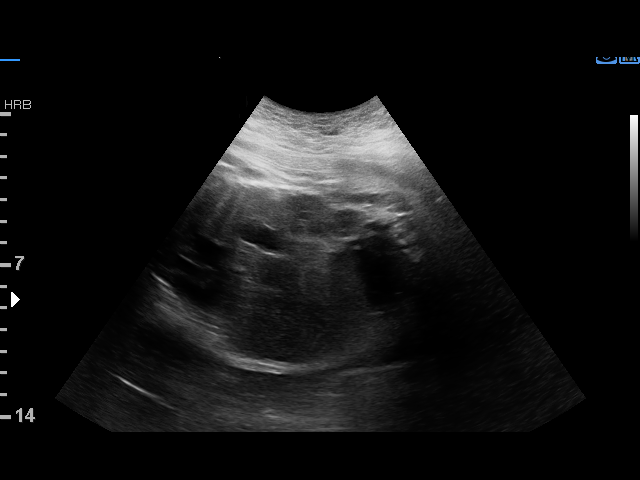
[im 4/28]
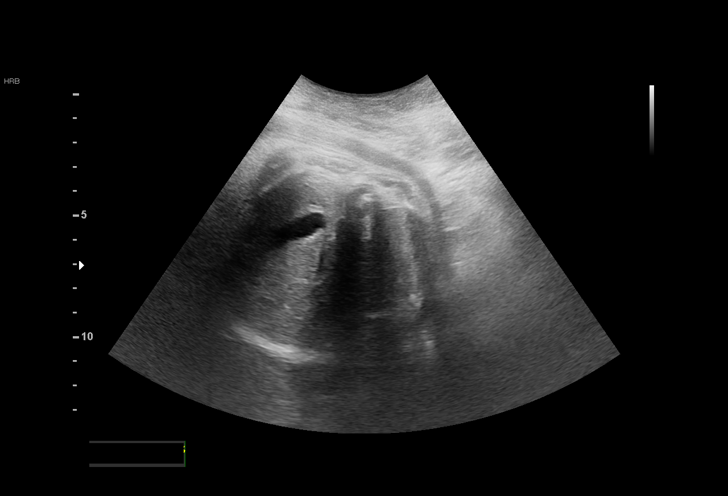
[im 6/28]
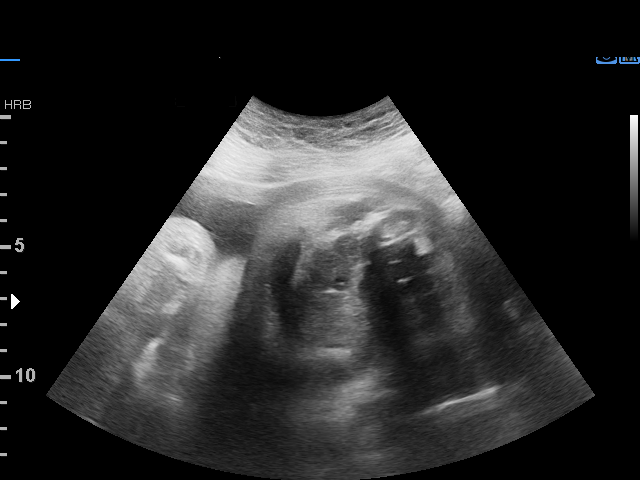
[im 8/28]
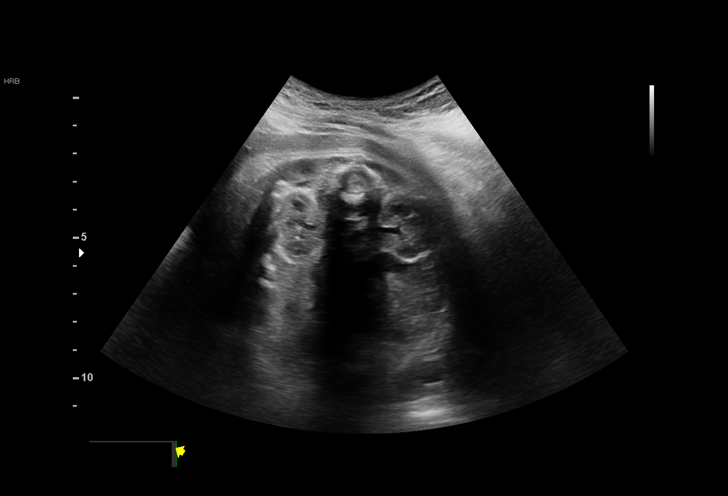
[im 10/28]
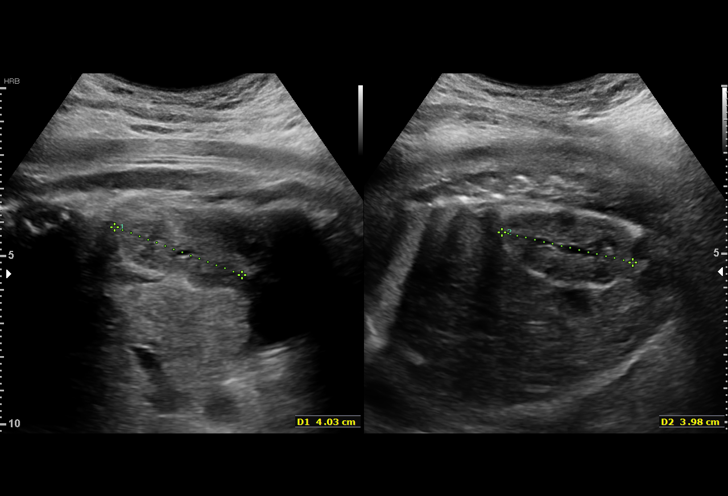
[im 12/28]
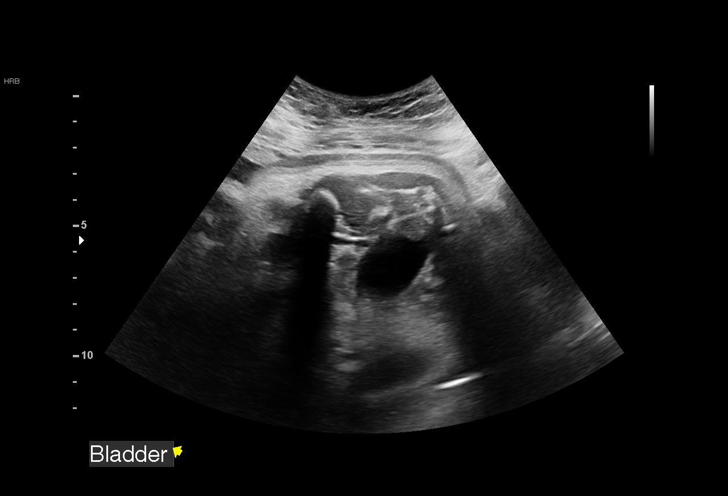
[im 15/28]
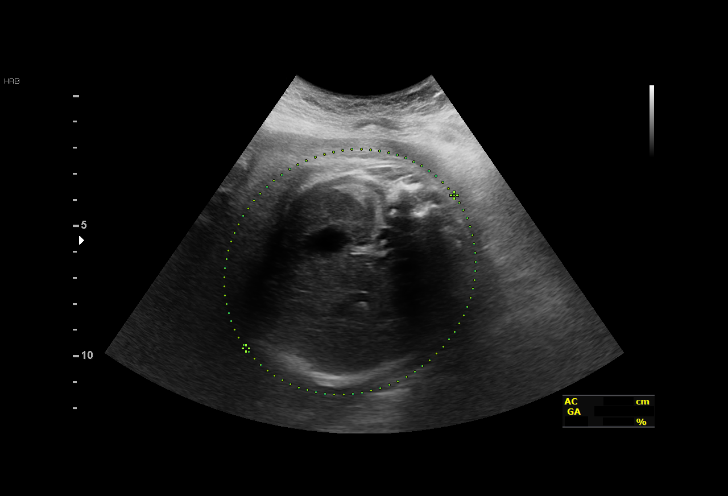
[im 17/28]
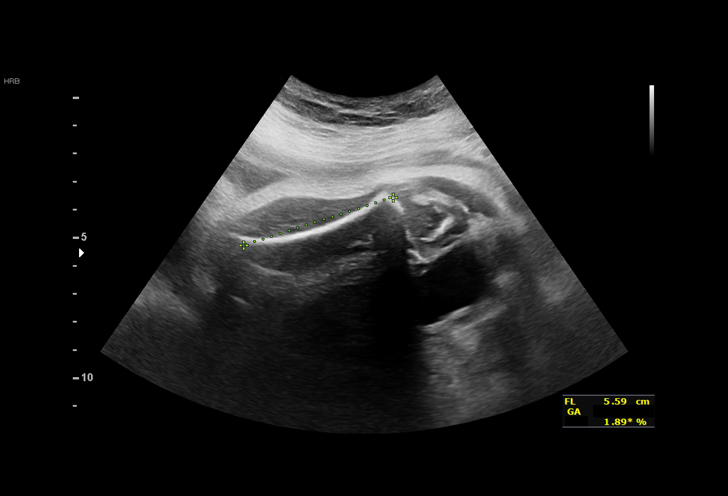
[im 19/28]
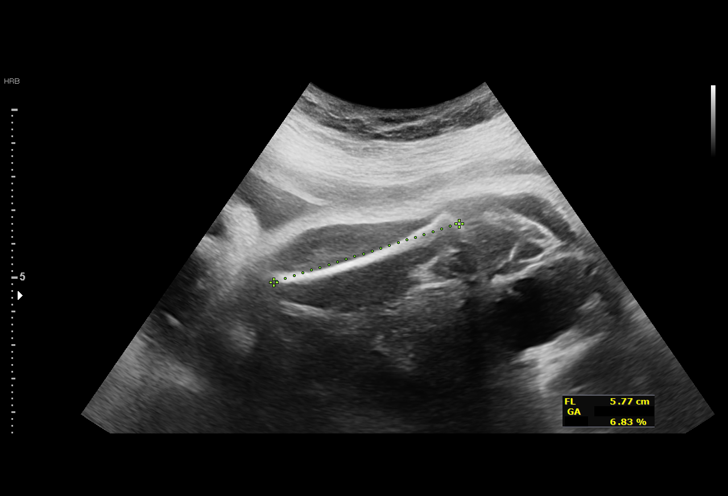
[im 21/28]
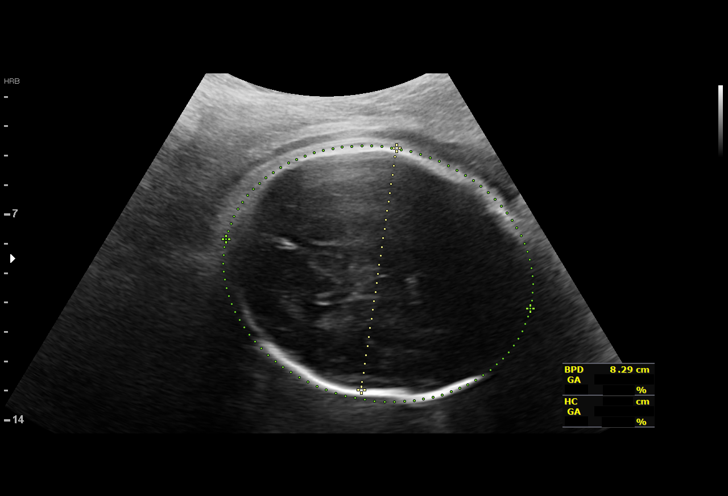
[im 23/28]
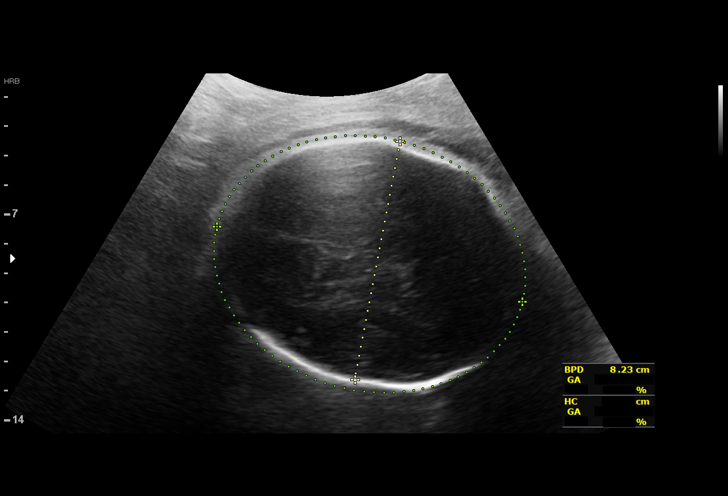
[im 25/28]
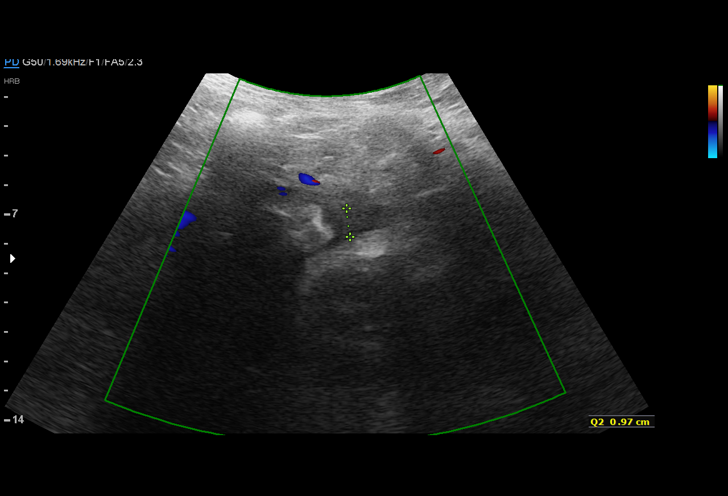
[im 27/28]
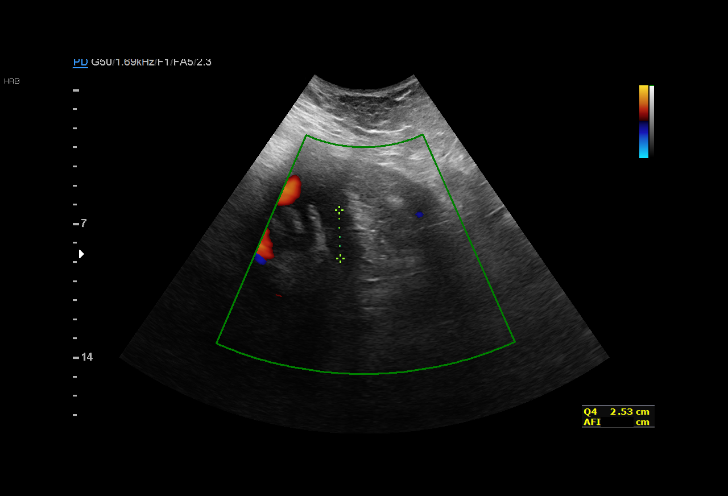

[13 of 28 positions shown; findings below may reference images not displayed]

OBGYN

Indications

 Hypertension - Chronic/Pre-existing
 (procardia)
 Gestational diabetes in pregnancy,
 controlled by oral hypoglycemic drugs
 (metformin)
 Encounter for other antenatal screening
 follow-up
 Obesity complicating pregnancy, second
 trimester
 History of cesarean delivery, currently
 pregnant
 31 weeks gestation of pregnancy
Vital Signs

                                                Height:        5'1"
Fetal Evaluation

 Num Of Fetuses:         1
 Fetal Heart Rate(bpm):  132
 Cardiac Activity:       Observed
 Presentation:           Breech
 Placenta:               Posterior

 Amniotic Fluid
 AFI FV:      Within normal limits

 AFI Sum(cm)     %Tile       Largest Pocket(cm)
 11.3            26

 RUQ(cm)       RLQ(cm)       LUQ(cm)        LLQ(cm)
 4.1           2.5           1
Biophysical Evaluation

 Amniotic F.V:   Within normal limits       F. Tone:        Observed
 F. Movement:    Observed                   Score:          [DATE]
 F. Breathing:   Observed
Biometry

 BPD:      82.5  mm     G. Age:  33w 1d         82  %    CI:        73.73   %    70 - 86
                                                         FL/HC:      18.6   %    19.1 -
 HC:      305.2  mm     G. Age:  34w 0d         74  %    HC/AC:      1.04        0.96 -
 AC:      293.9  mm     G. Age:  33w 3d         89  %    FL/BPD:     69.0   %    71 - 87
 FL:       56.9  mm     G. Age:  29w 6d        4.1  %    FL/AC:      19.4   %    20 - 24

 Est. FW:    0799  gm      4 lb 5 oz     62  %
OB History

 Gravidity:    6         Term:   1        Prem:   1        SAB:   0
 TOP:          3       Ectopic:  0        Living: 2
Gestational Age

 LMP:           32w 5d        Date:  12/22/18                 EDD:   09/28/19
 U/S Today:     32w 4d                                        EDD:   09/29/19
 Best:          31w 5d     Det. By:  Early Ultrasound         EDD:   10/05/19
                                     (02/26/19)
Anatomy

 Cranium:               Appears normal         LVOT:                   Previously seen
 Cavum:                 Previously seen        Aortic Arch:            Previously seen
 Ventricles:            Previously seen        Ductal Arch:            Not well visualized
 Choroid Plexus:        Previously seen        Diaphragm:              Not well visualized
 Cerebellum:            Previously seen        Stomach:                Appears normal, left
                                                                       sided
 Posterior Fossa:       Previously seen        Abdomen:                Appears normal
 Nuchal Fold:           Previously seen        Abdominal Wall:         Previously seen
 Face:                  Orbits and profile     Cord Vessels:           Previously seen
                        previously seen
 Lips:                  Previously seen        Kidneys:                Appear normal
 Palate:                Not well visualized    Bladder:                Appears normal
 Thoracic:              Appears normal         Spine:                  Limited views
                                                                       previously seen
 Heart:                 Not well visualized    Upper Extremities:      Previously seen
 RVOT:                  Not well visualized    Lower Extremities:      Previously seen

 Other:  Left heel previously visualized. Technically difficult due to maternal
         habitus and fetal position.
Cervix Uterus Adnexa

 Cervix
 Not visualized (advanced GA >05wks)
Comments

 This patient was seen for a follow up growth scan and fetal
 testing due to maternal obesity, gestational diabetes currently
 treated with Metformin, and chronic hypertension currently
 treated with labetalol and nifedipine.  She denies any
 problems since her last exam.
 She was informed that the fetal growth and amniotic fluid
 level appears appropriate for her gestational age.
 A biophysical profile performed today was [DATE].
 Due to her underlying medical conditions, we will continue to
 follow her with weekly fetal testing.
 Another biophysical profile was scheduled in 1 week.

## 2020-09-29 ENCOUNTER — Other Ambulatory Visit: Payer: Self-pay

## 2020-09-29 ENCOUNTER — Ambulatory Visit (INDEPENDENT_AMBULATORY_CARE_PROVIDER_SITE_OTHER): Payer: Medicaid Other

## 2020-09-29 VITALS — BP 167/120 | HR 118

## 2020-09-29 DIAGNOSIS — Z3200 Encounter for pregnancy test, result unknown: Secondary | ICD-10-CM

## 2020-09-29 DIAGNOSIS — Z3201 Encounter for pregnancy test, result positive: Secondary | ICD-10-CM

## 2020-09-29 LAB — POCT URINE PREGNANCY: Preg Test, Ur: POSITIVE — AB

## 2020-09-29 NOTE — Progress Notes (Signed)
Marilyn Cooper presents today for UPT. She has complains of headache, she thinks her blood pressure is high today.LMP: 08/10/2020     OBJECTIVE: Appears well, however blood pressure is elevated. Dr.Arnold would like to start patient back on labetalol 200 mg twice per day total of 400 mg. OB History     Gravida  7   Para  3   Term  1   Preterm  2   AB  3   Living  3      SAB      IAB  3   Ectopic      Multiple  0   Live Births  3          Home UPT Result: positive  In-Office UPT result:positive  I have reviewed the patient's medical, obstetrical, social, and family histories, and medications.   ASSESSMENT: Positive pregnancy test  PLAN: Recheck blood pressure in 1 week. Prenatal care to be completed at: [facility] around 10 weeks.

## 2020-10-06 ENCOUNTER — Ambulatory Visit (INDEPENDENT_AMBULATORY_CARE_PROVIDER_SITE_OTHER): Payer: Medicaid Other

## 2020-10-06 ENCOUNTER — Other Ambulatory Visit: Payer: Self-pay

## 2020-10-06 VITALS — BP 148/87 | HR 112

## 2020-10-06 DIAGNOSIS — Z3481 Encounter for supervision of other normal pregnancy, first trimester: Secondary | ICD-10-CM

## 2020-10-06 NOTE — Progress Notes (Signed)
Patient presents for a 1 week blood pressure recheck. Patient states that she has been taking labetalol 300 mg twice a day. She reports getting readings in the low 130s/70s. She denies having any headaches or blurred vision.

## 2020-10-21 ENCOUNTER — Other Ambulatory Visit: Payer: Self-pay

## 2020-10-21 ENCOUNTER — Ambulatory Visit (INDEPENDENT_AMBULATORY_CARE_PROVIDER_SITE_OTHER): Payer: Medicaid Other

## 2020-10-21 VITALS — BP 146/87 | HR 100 | Ht 61.0 in | Wt 283.6 lb

## 2020-10-21 DIAGNOSIS — O3680X0 Pregnancy with inconclusive fetal viability, not applicable or unspecified: Secondary | ICD-10-CM

## 2020-10-21 DIAGNOSIS — O0990 Supervision of high risk pregnancy, unspecified, unspecified trimester: Secondary | ICD-10-CM

## 2020-10-21 NOTE — Progress Notes (Signed)
New OB Intake  I connected with  Marilyn Cooper on 10/21/20 at 10:15 AM EDT by in person. Video Visit and verified that I am speaking with the correct person using two identifiers. Nurse is located at [facility] and pt is located at [place].  I discussed the limitations, risks, security and privacy concerns of performing an evaluation and management service by telephone and the availability of in person appointments. I also discussed with the patient that there may be a patient responsible charge related to this service. The patient expressed understanding and agreed to proceed.  I explained I am completing New OB Intake today. We discussed her EDD of 05/25/21 that is based on early u/s. Pt is G8/P1233. I reviewed her allergies, medications, Medical/Surgical/OB history, and appropriate screenings. I informed her of [facility] services. Based on history, this is a/an  pregnancy complicated by hypertension .   Patient Active Problem List   Diagnosis Date Noted   SAB (spontaneous abortion) 04/29/2020   Depressed mood 04/29/2020   Chronic hypertension with superimposed preeclampsia 08/23/2019   Gestational diabetes 08/23/2019   Depression 10/08/2015   Panic disorder 08/03/2015   Hypertension in pregnancy, transient, antepartum 12/03/2014   Incarcerated umbilical hernia 06/21/2014   Generalized anxiety disorder 02/04/2013   Weight gain 02/04/2013    Concerns addressed today  Delivery Plans:  Plans to deliver at [facility].   MyChart/Babyscripts MyChart access verified. I explained pt will have some visits in office and some virtually. Babyscripts instructions given and order placed. Patient verifies receipt of registration text/e-mail. Account successfully created and app downloaded.  Blood Pressure Cuff  Blood pressure cuff ordered for patient to pick-up from [employer]. Explained after first prenatal appt pt will check weekly and document in Babyscripts.  Weight scale: Patient has picked up  weight scale. Weight scale ordered for patient to pick up form [facility].   Anatomy US Explained first scheduled [place] will be around 19 weeks. Dating and viability scan performed today.  Labs Discussed Avelina Laine genetic screening with patient. Would like both Panorama and Horizon drawn at new OB visit. Routine prenatal labs needed.  Covid Vaccine Patient has covid vaccine.   Mother/ Baby Dyad Candidate?    If yes, offer as possibility  Informed patient of [facility] website  and placed link in her AVS.   Social Determinants of Health Food Insecurity: Patient denies food insecurity. WIC Referral: Patient is interested in referral to [facility].  Transportation: Patient denies transportation needs. Childcare: Discussed no children allowed at ultrasound appointments. Offered childcare services; patient declines childcare services at this time.  Send link to Pregnancy Navigators   Placed OB Box on problem list and updated  First visit review I reviewed new OB appt with pt. I explained she will have a pelvic exam, ob bloodwork with genetic screening, and PAP smear. Explained pt will be seen by Coral Ceo at first visit; encounter routed to appropriate provider. Explained that patient will be seen by pregnancy navigator following visit with provider. [facility] information placed in AVS.   Hamilton Capri, RN 10/21/2020  10:13 AM

## 2020-10-21 NOTE — Progress Notes (Signed)
Patient states that she started spotting this past Monday 10/3. She states that bleeding started off brown, and then she had a red clot about the size a pea. Spotting is now brown and she only see it when wiping. Last intercourse was this past Saturday 10/1. She denies having any cramping.

## 2020-10-23 ENCOUNTER — Other Ambulatory Visit: Payer: Self-pay

## 2020-10-23 ENCOUNTER — Ambulatory Visit (HOSPITAL_COMMUNITY)
Admission: EM | Admit: 2020-10-23 | Discharge: 2020-10-23 | Disposition: A | Discharge status: Home / Self Care | Payer: Medicaid Other | Attending: Physician Assistant | Admitting: Physician Assistant

## 2020-10-23 ENCOUNTER — Encounter (HOSPITAL_COMMUNITY): Payer: Self-pay | Admitting: Emergency Medicine

## 2020-10-23 DIAGNOSIS — N3001 Acute cystitis with hematuria: Secondary | ICD-10-CM | POA: Diagnosis present

## 2020-10-23 DIAGNOSIS — O2311 Infections of bladder in pregnancy, first trimester: Secondary | ICD-10-CM

## 2020-10-23 DIAGNOSIS — R350 Frequency of micturition: Secondary | ICD-10-CM | POA: Insufficient documentation

## 2020-10-23 DIAGNOSIS — N390 Urinary tract infection, site not specified: Secondary | ICD-10-CM | POA: Insufficient documentation

## 2020-10-23 DIAGNOSIS — O2341 Unspecified infection of urinary tract in pregnancy, first trimester: Secondary | ICD-10-CM | POA: Diagnosis not present

## 2020-10-23 DIAGNOSIS — O10911 Unspecified pre-existing hypertension complicating pregnancy, first trimester: Secondary | ICD-10-CM | POA: Diagnosis not present

## 2020-10-23 DIAGNOSIS — R300 Dysuria: Secondary | ICD-10-CM | POA: Insufficient documentation

## 2020-10-23 DIAGNOSIS — R030 Elevated blood-pressure reading, without diagnosis of hypertension: Secondary | ICD-10-CM | POA: Diagnosis present

## 2020-10-23 DIAGNOSIS — Z3A09 9 weeks gestation of pregnancy: Secondary | ICD-10-CM | POA: Diagnosis not present

## 2020-10-23 LAB — POCT URINALYSIS DIPSTICK, ED / UC
Bilirubin Urine: NEGATIVE
Glucose, UA: NEGATIVE mg/dL
Ketones, ur: NEGATIVE mg/dL
Nitrite: NEGATIVE
Protein, ur: 30 mg/dL — AB
Specific Gravity, Urine: 1.01 (ref 1.005–1.030)
Urobilinogen, UA: 0.2 mg/dL (ref 0.0–1.0)
pH: 6.5 (ref 5.0–8.0)

## 2020-10-23 MED ORDER — AMOXICILLIN-POT CLAVULANATE 875-125 MG PO TABS: 1.0000 | ORAL_TABLET | Freq: Two times a day (BID) | ORAL | 0 refills | Status: DC

## 2020-10-23 NOTE — Discharge Instructions (Addendum)
We are treating you for urinary tract infection.  Please take Augmentin twice daily for 7 days.  We will contact you if your urine culture indicates we need to change antibiotics.  Make sure you are drinking plenty of fluid.  Follow-up with your OB/GYN first thing next week.  If you have any worsening symptoms including spotting, abdominal pain, nausea, vomiting you need to go to the emergency room over the weekend.  Please go home and take your evening dose of labetalol.  If you continue to have elevated blood pressure readings you need to be reevaluated as we discussed.

## 2020-10-23 NOTE — ED Triage Notes (Addendum)
Patient c/o  urinary frequency and hematuria x 1 day.   Patient denies fever.   Patient endorses "cloudy urine". Patient endorses lower back pain at times. Patient endorses lower ABD pain which "goes away after peeing".   Patient endorses vaginal spotting that has now resolved currently.   Patient endorses history of high blood pressure during pregnancy.   Patient has tried cranberry juice with no relief of symptoms.

## 2020-10-23 NOTE — ED Provider Notes (Addendum)
[facility]    CSN: [id number] Arrival date & time: 10/23/20  1603      History   Chief Complaint Chief Complaint  Patient presents with   Urinary Frequency   Hematuria    HPI Marilyn Cooper is a 30 y.o. female.   Patient presents today with a 1 day history of UTI symptoms.  She reports urinary frequency, dysuria, lower abdominal pain, hematuria.  She denies any ongoing abdominal pain, fever, nausea, vomiting.  She denies history of nephrolithiasis.  She does have a history of UTI and states current symptoms are similar to previous episodes of this condition.  She is currently [redacted] weeks pregnant.  She did contact her OB/GYN who agreed symptoms were consistent with a UTI but unfortunately they were unable to see her since they were closed.  She denies any recent antibiotic use.  She has tried cranberry juice without improvement of symptoms.  Denies self-catheterization or recent urogenital procedure.  Blood pressure was noted to be elevated today.  Patient has a history of elevated blood pressure and is currently prescribed labetalol.  She is due for her evening dose but does not have this with her.  She believes that symptoms are elevated because she is not feeling well from UTI.  She denies any significant swelling, headache, dizziness, chest pain, shortness of breath.  She is able to monitor her blood pressure at home and reports this is consistently been under 140/90 until she got sick.   Past Medical History:  Diagnosis Date   Anemia    Anxiety    Asthma    childhood   Chronic hypertension    Depression    see social work consult, doing really well right now 'is in a good place" 3/29   Gestational diabetes    Hx of varicella    Obese     Patient Active Problem List   Diagnosis Date Noted   Supervision of high risk pregnancy, antepartum 10/21/2020   SAB (spontaneous abortion) 04/29/2020   Depressed mood 04/29/2020   Chronic hypertension with superimposed  preeclampsia 08/23/2019   Gestational diabetes 08/23/2019   Depression 10/08/2015   Panic disorder 08/03/2015   Hypertension in pregnancy, transient, antepartum 12/03/2014   Incarcerated umbilical hernia 06/21/2014   Generalized anxiety disorder 02/04/2013   Weight gain 02/04/2013    Past Surgical History:  Procedure Laterality Date   BLADDER REPAIR N/A 08/29/2019   Procedure: BLADDER REPAIR;  Surgeon: Waynard Reeds, MD;  [facility];  Service: Gynecology;  Laterality: N/A;   CESAREAN SECTION N/A 03/09/2012   Procedure: CESAREAN SECTION;  Surgeon: Freddrick March. Tenny Craw, MD;  Location: [facility];  Service: Obstetrics;  Laterality: N/A;   CESAREAN SECTION N/A 12/03/2014   Procedure: CESAREAN SECTION;  Surgeon: Marlow Baars, MD;  Location: [facility];  Service: Obstetrics;  Laterality: N/A;   CESAREAN SECTION N/A 08/29/2019   Procedure: CESAREAN SECTION;  Surgeon: Waynard Reeds, MD;  [facility]: [facility];  Service: Obstetrics;  Laterality: N/A;   COLPOSCOPY     CYSTOSCOPY N/A 08/29/2019   Procedure: CYSTOSCOPY;  Surgeon: Waynard Reeds, MD;  Location: [facility];  Service: Gynecology;  Laterality: N/A;   HERNIA REPAIR     LAPAROTOMY N/A 08/29/2019   Procedure: EXPLORATORY LAPAROTOMY;  Surgeon: Waynard Reeds, MD;  Location: [facility];  Service: Gynecology;  Laterality: N/A;   TONSILLECTOMY     UMBILICAL HERNIA REPAIR N/A 06/21/2014   Procedure: EXPLORATORY LAPAROTOMY,  SMALL BOWEL RESECTION, PRIMARY  REPAIR INCARCERATED VENTRAL HERNIA;  Surgeon: Darnell Level, MD;  Location: [facility];  Service: General;  Laterality: N/A;    OB History     Gravida  8   Para  3   Term  1   Preterm  2   AB  3   Living  3      SAB      IAB  3   Ectopic      Multiple  0   Live Births  3            Home Medications    Prior to Admission medications   Medication Sig Start Date End Date Taking? Authorizing Provider  amoxicillin-clavulanate (AUGMENTIN) 875-125 MG tablet Take 1 tablet by mouth every 12  (twelve) hours. 10/23/20  Yes Slater Mcmanaman K, PA-C  labetalol (NORMODYNE) 300 MG tablet 2 (two) times daily.   Yes [provider]  Prenatal Vit-Fe Fumarate-FA (PRENATAL VITAMIN PO) Take 1 tablet by mouth daily.   Yes [provider]    Family History Family History  Problem Relation Age of Onset   Anemia Mother    Anemia Sister    Birth defects Brother        heart disorder?   Hypertension Maternal Grandmother    Diabetes Maternal Grandmother    Hypertension Maternal Grandfather    Diabetes Maternal Grandfather    Hypertension Paternal Grandmother    Hypertension Paternal Grandfather    Diabetes Father     Social History Social History   Tobacco Use   Smoking status: Never   Smokeless tobacco: Never  Vaping Use   Vaping Use: Never used  Substance Use Topics   Alcohol use: No   Drug use: No     Allergies   Patient has no known allergies.   Review of Systems Review of Systems  Constitutional:  Positive for activity change. Negative for appetite change, fatigue and fever.  Respiratory:  Negative for cough and shortness of breath.   Cardiovascular:  Negative for chest pain and leg swelling.  Gastrointestinal:  Positive for abdominal pain. Negative for diarrhea, nausea and vomiting.  Genitourinary:  Positive for dysuria, frequency, hematuria and urgency. Negative for flank pain, pelvic pain, vaginal bleeding, vaginal discharge and vaginal pain.  Neurological:  Negative for dizziness, light-headedness and headaches.    Physical Exam Triage Vital Signs ED Triage Vitals  Enc Vitals Group     BP 10/23/20 1748 (!) 165/105     Pulse Rate 10/23/20 1748 (!) 111     Resp 10/23/20 1748 16     Temp 10/23/20 1748 98.3 F (36.8 C)     Temp Source 10/23/20 1748 Oral     SpO2 10/23/20 1748 98 %     Weight --      Height --      Head Circumference --      Peak Flow --      Pain Score 10/23/20 1746 3     Pain Loc --      Pain Edu? --      Excl. in GC? --     No data found.  Updated Vital Signs BP (!) 162/97 (BP Location: Right Arm)   Pulse 85   Temp 98.3 F (36.8 C) (Oral)   Resp 16   LMP 08/10/2020 Comment: neg preg test  SpO2 98%   Visual Acuity Right Eye Distance:   Left Eye Distance:   Bilateral Distance:    Right Eye Near:  Left Eye Near:    Bilateral Near:     Physical Exam Vitals reviewed.  Constitutional:      General: She is awake. She is not in acute distress.    Appearance: Normal appearance. She is well-developed. She is not ill-appearing.     Comments: Very pleasant female appears stated age no acute distress sitting comfortably in exam room table  HENT:     Head: Normocephalic and atraumatic.  Cardiovascular:     Rate and Rhythm: Normal rate and regular rhythm.     Heart sounds: Normal heart sounds, S1 normal and S2 normal. No murmur heard. Pulmonary:     Effort: Pulmonary effort is normal.     Breath sounds: Normal breath sounds. No wheezing, rhonchi or rales.     Comments: Clear to auscultation bilaterally Abdominal:     General: Bowel sounds are normal.     Palpations: Abdomen is soft.     Tenderness: There is no abdominal tenderness. There is no right CVA tenderness, left CVA tenderness, guarding or rebound.     Comments: Benign abdominal exam; nontender to palpation.  No CVA tenderness.  Genitourinary:    Comments: Exam deferred Musculoskeletal:     Right lower leg: No edema.     Left lower leg: No edema.  Psychiatric:        Behavior: Behavior is cooperative.     UC Treatments / Results  Labs (all labs ordered are listed, but only abnormal results are displayed) Labs Reviewed  POCT URINALYSIS DIPSTICK, ED / UC - Abnormal; Notable for the following components:      Result Value   Hgb urine dipstick LARGE (*)    Protein, ur 30 (*)    Leukocytes,Ua SMALL (*)    All other components within normal limits  URINE CULTURE    EKG   Radiology No results found.  Procedures Procedures  (including critical care time)  Medications Ordered in UC Medications - No data to display  Initial Impression / Assessment and Plan / UC Course  I have reviewed the triage vital signs and the nursing notes.  Pertinent labs & imaging results that were available during my care of the patient were reviewed by me and considered in my medical decision making (see chart for details).      UA showed evidence of infection.  Patient was empirically treated with Augmentin.  Urine culture obtained-results pending.  Discussed potential need to change antibiotics based on susceptibilities identified on culture.  Encouraged her to rest and drink plenty of fluid.  She is to follow-up with her OB/GYN first thing next week.  Discussed alarm symptoms that warrant emergent evaluation to which she expressed understanding.  Strict return precautions given.  Blood pressure is elevated today.  Patient reports that she is overdue for her labetalol dose.  Recommend she take this immediately when she gets home and monitor her blood pressure few hours later.  If this is persistently elevated or she develops any symptoms she is to go directly to the emergency room.  Recommend she monitor her blood pressure closely and keep log for evaluation at OB/GYN first thing next week.  Final Clinical Impressions(s) / UC Diagnoses   Final diagnoses:  Lower urinary tract infectious disease  Acute cystitis with hematuria  Dysuria  Urinary frequency  [redacted] weeks gestation of pregnancy  Elevated blood pressure reading     Discharge Instructions      We are treating you for urinary tract infection.  Please  take Augmentin twice daily for 7 days.  We will contact you if your urine culture indicates we need to change antibiotics.  Make sure you are drinking plenty of fluid.  Follow-up with your OB/GYN first thing next week.  If you have any worsening symptoms including spotting, abdominal pain, nausea, vomiting you need to go to the  emergency room over the weekend.  Please go home and take your evening dose of labetalol.  If you continue to have elevated blood pressure readings you need to be reevaluated as we discussed.     ED Prescriptions     Medication Sig Dispense Auth. Provider   amoxicillin-clavulanate (AUGMENTIN) 875-125 MG tablet Take 1 tablet by mouth every 12 (twelve) hours. 14 tablet Mckinzie Saksa, Noberto Retort, PA-C      PDMP not reviewed this encounter.   Jeani Hawking, PA-C 10/23/20 1838    Jeani Hawking, PA-C 10/23/20 1855

## 2020-10-25 LAB — URINE CULTURE

## 2020-10-30 ENCOUNTER — Other Ambulatory Visit: Payer: Medicaid Other

## 2020-10-30 ENCOUNTER — Other Ambulatory Visit: Payer: Self-pay

## 2020-10-30 ENCOUNTER — Other Ambulatory Visit: Payer: Self-pay | Admitting: *Deleted

## 2020-10-30 DIAGNOSIS — R399 Unspecified symptoms and signs involving the genitourinary system: Secondary | ICD-10-CM

## 2020-10-30 MED ORDER — NITROFURANTOIN MONOHYD MACRO 100 MG PO CAPS: 100.0000 mg | ORAL_CAPSULE | Freq: Two times a day (BID) | ORAL | 0 refills | Status: DC

## 2020-10-30 NOTE — Progress Notes (Signed)
Pt called to office stating recent tx for UTI and was advised to follow up with OB.  Pt states that she is still having blood in her urine.  Per Dr Clearance Coots, pt to come leave sample to culture and may send Macrobid today. Rx has been sent, pt scheduled for lab visit today.

## 2020-11-01 LAB — URINE CULTURE, OB REFLEX: Organism ID, Bacteria: NO GROWTH

## 2020-11-01 LAB — CULTURE, OB URINE

## 2020-11-04 ENCOUNTER — Other Ambulatory Visit (HOSPITAL_COMMUNITY)
Admission: RE | Admit: 2020-11-04 | Discharge: 2020-11-04 | Disposition: A | Discharge status: Home / Self Care | Payer: Medicaid Other | Source: Ambulatory Visit | Attending: Obstetrics | Admitting: Obstetrics

## 2020-11-04 ENCOUNTER — Other Ambulatory Visit: Payer: Self-pay

## 2020-11-04 ENCOUNTER — Ambulatory Visit (INDEPENDENT_AMBULATORY_CARE_PROVIDER_SITE_OTHER): Payer: Medicaid Other | Admitting: Obstetrics

## 2020-11-04 ENCOUNTER — Encounter: Payer: Self-pay | Admitting: Obstetrics

## 2020-11-04 VITALS — BP 158/100 | HR 92 | Wt 287.2 lb

## 2020-11-04 DIAGNOSIS — O0990 Supervision of high risk pregnancy, unspecified, unspecified trimester: Secondary | ICD-10-CM

## 2020-11-04 DIAGNOSIS — O34219 Maternal care for unspecified type scar from previous cesarean delivery: Secondary | ICD-10-CM

## 2020-11-04 DIAGNOSIS — O99210 Obesity complicating pregnancy, unspecified trimester: Secondary | ICD-10-CM

## 2020-11-04 DIAGNOSIS — Z3480 Encounter for supervision of other normal pregnancy, unspecified trimester: Secondary | ICD-10-CM

## 2020-11-04 DIAGNOSIS — Z3A11 11 weeks gestation of pregnancy: Secondary | ICD-10-CM | POA: Diagnosis not present

## 2020-11-04 DIAGNOSIS — O10019 Pre-existing essential hypertension complicating pregnancy, unspecified trimester: Secondary | ICD-10-CM

## 2020-11-04 MED ORDER — NIFEDIPINE ER OSMOTIC RELEASE 30 MG PO TB24: 30.0000 mg | ORAL_TABLET | Freq: Every day | ORAL | 6 refills | Status: DC

## 2020-11-04 MED ORDER — ASPIRIN 81 MG PO CHEW: 81.0000 mg | CHEWABLE_TABLET | Freq: Every day | ORAL | 6 refills | Status: DC

## 2020-11-04 NOTE — Progress Notes (Signed)
Subjective:    Marilyn Cooper is being seen today for her first obstetrical visit.  This is a planned pregnancy. She is at [redacted]w[redacted]d gestation. Her obstetrical history is significant for obesity and chronic hypertension with superimposed pre-eclampsia. Relationship with FOB: spouse, living together. Patient does intend to breast feed. Pregnancy history fully reviewed.  The information documented in the HPI was reviewed and verified.  Menstrual History: OB History     Gravida  8   Para  3   Term  1   Preterm  2   AB  3   Living  3      SAB      IAB  3   Ectopic      Multiple  0   Live Births  3            Patient's last menstrual period was 08/10/2020.    Past Medical History:  Diagnosis Date   Anemia    Anxiety    Asthma    childhood   Chronic hypertension    Depression    see social work consult, doing really well right now 'is in a good place" 3/29   Gestational diabetes    Hx of varicella    Obese     Past Surgical History:  Procedure Laterality Date   BLADDER REPAIR N/A 08/29/2019   Procedure: BLADDER REPAIR;  Surgeon: Waynard Reeds, MD;  [facility];  Service: Gynecology;  Laterality: N/A;   CESAREAN SECTION N/A 03/09/2012   Procedure: CESAREAN SECTION;  Surgeon: Freddrick March. Tenny Craw, MD;  Location: [facility];  Service: Obstetrics;  Laterality: N/A;   CESAREAN SECTION N/A 12/03/2014   Procedure: CESAREAN SECTION;  Surgeon: Marlow Baars, MD;  Location: [facility];  Service: Obstetrics;  Laterality: N/A;   CESAREAN SECTION N/A 08/29/2019   Procedure: CESAREAN SECTION;  Surgeon: Waynard Reeds, MD;  [facility];  Service: Obstetrics;  Laterality: N/A;   COLPOSCOPY     CYSTOSCOPY N/A 08/29/2019   Procedure: CYSTOSCOPY;  Surgeon: Waynard Reeds, MD;  [facility];  Service: Gynecology;  Laterality: N/A;   HERNIA REPAIR     LAPAROTOMY N/A 08/29/2019   Procedure: EXPLORATORY LAPAROTOMY;  Surgeon: Waynard Reeds, MD;  Location: [facility];  Service:  Gynecology;  Laterality: N/A;   TONSILLECTOMY     UMBILICAL HERNIA REPAIR N/A 06/21/2014   Procedure: EXPLORATORY LAPAROTOMY,  SMALL BOWEL RESECTION, PRIMARY REPAIR INCARCERATED VENTRAL HERNIA;  Surgeon: Darnell Level, MD;  Location: [facility];  Service: General;  Laterality: N/A;    (Not in a hospital admission)  No Known Allergies  Social History   Tobacco Use   Smoking status: Never   Smokeless tobacco: Never  Substance Use Topics   Alcohol use: No    Family History  Problem Relation Age of Onset   Anemia Mother    Anemia Sister    Birth defects Brother        heart disorder?   Hypertension Maternal Grandmother    Diabetes Maternal Grandmother    Hypertension Maternal Grandfather    Diabetes Maternal Grandfather    Hypertension Paternal Grandmother    Hypertension Paternal Grandfather    Diabetes Father      Review of Systems Constitutional: negative for weight loss Gastrointestinal: negative for vomiting Genitourinary:negative for genital lesions and vaginal discharge and dysuria Musculoskeletal:negative for back pain Behavioral/Psych: negative for abusive relationship, depression, illegal drug usage and tobacco use    Objective:  BP (!) 158/100   Pulse 92   Wt 287 lb 3.2 oz (130.3 kg)   LMP 08/10/2020 Comment: neg preg test  BMI 54.27 kg/m  General Appearance:    Alert, cooperative, no distress, appears stated age  Head:    Normocephalic, without obvious abnormality, atraumatic  Eyes:    PERRL, conjunctiva/corneas clear, EOM's intact, fundi    benign, both eyes  Ears:    Normal TM's and external ear canals, both ears  Nose:   Nares normal, septum midline, mucosa normal, no drainage    or sinus tenderness  Throat:   Lips, mucosa, and tongue normal; teeth and gums normal  Neck:   Supple, symmetrical, trachea midline, no adenopathy;    thyroid:  no enlargement/tenderness/nodules; no carotid   bruit or JVD  Back:     Symmetric, no curvature, ROM normal, no CVA  tenderness  Lungs:     Clear to auscultation bilaterally, respirations unlabored  Chest Wall:    No tenderness or deformity   Heart:    Regular rate and rhythm, S1 and S2 normal, no murmur, rub   or gallop  Breast Exam:    No tenderness, masses, or nipple abnormality  Abdomen:     Soft, non-tender, bowel sounds active all four quadrants,    no masses, no organomegaly  Genitalia:    Normal female without lesion, discharge or tenderness  Extremities:   Extremities normal, atraumatic, no cyanosis or edema  Pulses:   2+ and symmetric all extremities  Skin:   Skin color, texture, turgor normal, no rashes or lesions  Lymph nodes:   Cervical, supraclavicular, and axillary nodes normal  Neurologic:   CNII-XII intact, normal strength, sensation and reflexes    throughout      Lab Review Urine pregnancy test Labs reviewed yes Radiologic studies reviewed yes  Assessment:    Pregnancy at [redacted]w[redacted]d weeks    Plan:    1. Supervision of high risk pregnancy, antepartum Rx: - CBC/D/Plt+RPR+Rh+ABO+RubIgG... - Genetic Screening - Cervicovaginal ancillary only( [place]) - Hepatitis C Antibody - Cytology - PAP - Comprehensive metabolic panel - Hemoglobin A1c - NIFEdipine (PROCARDIA XL) 30 MG 24 hr tablet; Take 1 tablet (30 mg total) by mouth daily.  Dispense: 30 tablet; Refill: 6 - aspirin 81 MG chewable tablet; Chew 1 tablet (81 mg total) by mouth daily.  Dispense: 30 tablet; Refill: 6  2. Benign essential HTN, chronic, antepartum Rx: - NIFEdipine (PROCARDIA XL) 30 MG 24 hr tablet; Take 1 tablet (30 mg total) by mouth daily.  Dispense: 30 tablet; Refill: 6  3. Obesity affecting pregnancy, antepartum  4. Previous cesarean delivery, antepartum    Prenatal vitamins.  Counseling provided regarding continued use of seat belts, cessation of alcohol consumption, smoking or use of illicit drugs; infection precautions i.e., influenza/TDAP immunizations, toxoplasmosis,CMV, parvovirus, listeria and  varicella; workplace safety, exercise during pregnancy; routine dental care, safe medications, sexual activity, hot tubs, saunas, pools, travel, caffeine use, fish and methlymercury, potential toxins, hair treatments, varicose veins Weight gain recommendations per IOM guidelines reviewed: underweight/BMI< 18.5--> gain 28 - 40 lbs; normal weight/BMI 18.5 - 24.9--> gain 25 - 35 lbs; overweight/BMI 25 - 29.9--> gain 15 - 25 lbs; obese/BMI >30->gain  11 - 20 lbs Problem list reviewed and updated. FIRST/CF mutation testing/NIPT/QUAD SCREEN/fragile X/Ashkenazi Jewish population testing/Spinal muscular atrophy discussed: requested. Role of ultrasound in pregnancy discussed; fetal survey: requested. Amniocentesis discussed: not indicated.  Meds ordered this encounter  Medications   NIFEdipine (PROCARDIA XL) 30  MG 24 hr tablet    Sig: Take 1 tablet (30 mg total) by mouth daily.    Dispense:  30 tablet    Refill:  6   aspirin 81 MG chewable tablet    Sig: Chew 1 tablet (81 mg total) by mouth daily.    Dispense:  30 tablet    Refill:  6   NIFEdipine (PROCARDIA XL) 30 MG 24 hr tablet    Sig: Take 1 tablet (30 mg total) by mouth daily.    Dispense:  30 tablet    Refill:  6   Orders Placed This Encounter  Procedures   CBC/D/Plt+RPR+Rh+ABO+RubIgG...   Genetic Screening    PANORAMA   Hepatitis C Antibody   Comprehensive metabolic panel   Hemoglobin A1c    Follow up in 4 weeks.  I have spent a total of 20 minutes of face-to-face time, excluding clinical staff time, reviewing notes and preparing to see patient, ordering tests and/or medications, and counseling the patient.   Brock Bad, MD 11/04/2020 10:14 AM

## 2020-11-04 NOTE — Progress Notes (Signed)
New OB OB panel, GC/CC today Current treatment for UTI, Recent OB Urine Genetic screening delayed due to Obesity and early gestation Anxiety and depression screen negative

## 2020-11-05 LAB — CERVICOVAGINAL ANCILLARY ONLY
Chlamydia: NEGATIVE
Comment: NEGATIVE
Comment: NORMAL
Neisseria Gonorrhea: NEGATIVE

## 2020-11-06 LAB — CBC/D/PLT+RPR+RH+ABO+RUBIGG...
Basophils Absolute: 0 10*3/uL (ref 0.0–0.2)
Basos: 0 %
EOS (ABSOLUTE): 0 10*3/uL (ref 0.0–0.4)
Eos: 0 %
HCV Ab: <0.1 s/co ratio (ref 0.0–0.9)
HIV Screen 4th Generation wRfx: NONREACTIVE
Hematocrit: 33.8 % — ABNORMAL LOW (ref 34.0–46.6)
Hemoglobin: 10.3 g/dL — ABNORMAL LOW (ref 11.1–15.9)
Hepatitis B Surface Ag: NEGATIVE
Immature Grans (Abs): 0 10*3/uL (ref 0.0–0.1)
Immature Granulocytes: 0 %
Lymphocytes Absolute: 1.6 10*3/uL (ref 0.7–3.1)
Lymphs: 20 %
MCH: 20.9 pg — ABNORMAL LOW (ref 26.6–33.0)
MCHC: 30.5 g/dL — ABNORMAL LOW (ref 31.5–35.7)
MCV: 68 fL — ABNORMAL LOW (ref 79–97)
Monocytes Absolute: 0.4 10*3/uL (ref 0.1–0.9)
Monocytes: 5 %
Neutrophils Absolute: 6 10*3/uL (ref 1.4–7.0)
Neutrophils: 75 %
Platelets: 317 10*3/uL (ref 150–450)
RBC: 4.94 x10E6/uL (ref 3.77–5.28)
RDW: 17.6 % — ABNORMAL HIGH (ref 11.7–15.4)
RPR Ser Ql: NONREACTIVE
Rh Factor: POSITIVE
Rubella Antibodies, IGG: 5.5 index (ref >0.99)
WBC: 8 10*3/uL (ref 3.4–10.8)

## 2020-11-06 LAB — HEMOGLOBIN A1C
Est. average glucose Bld gHb Est-mCnc: 128 mg/dL
Hgb A1c MFr Bld: 6.1 % — ABNORMAL HIGH (ref 4.8–5.6)

## 2020-11-06 LAB — COMPREHENSIVE METABOLIC PANEL
ALT: 9 IU/L (ref 0–32)
AST: 11 IU/L (ref 0–40)
Albumin/Globulin Ratio: 1.3 (ref 1.2–2.2)
Albumin: 4.2 g/dL (ref 3.9–5.0)
Alkaline Phosphatase: 96 IU/L (ref 44–121)
BUN/Creatinine Ratio: 11 (ref 9–23)
BUN: 6 mg/dL (ref 6–20)
Bilirubin Total: <0.2 mg/dL (ref 0.0–1.2)
CO2: 17 mmol/L — ABNORMAL LOW (ref 20–29)
Calcium: 9.5 mg/dL (ref 8.7–10.2)
Chloride: 101 mmol/L (ref 96–106)
Creatinine, Ser: 0.56 mg/dL — ABNORMAL LOW (ref 0.57–1.00)
Globulin, Total: 3.2 g/dL (ref 1.5–4.5)
Glucose: 137 mg/dL — ABNORMAL HIGH (ref 70–99)
Potassium: 4.4 mmol/L (ref 3.5–5.2)
Sodium: 135 mmol/L (ref 134–144)
Total Protein: 7.4 g/dL (ref 6.0–8.5)
eGFR: 127 mL/min/{1.73_m2} (ref >59)

## 2020-11-06 LAB — HCV INTERPRETATION

## 2020-11-06 LAB — CYTOLOGY - PAP: Diagnosis: NEGATIVE

## 2020-11-06 LAB — AB SCR+ANTIBODY ID
Antibody Screen: POSITIVE — AB
Coombs Titer #1: 4

## 2020-11-06 LAB — HEPATITIS C ANTIBODY: Hep C Virus Ab: <0.1 s/co ratio (ref 0.0–0.9)

## 2020-11-07 ENCOUNTER — Other Ambulatory Visit: Payer: Self-pay | Admitting: Obstetrics

## 2020-11-07 DIAGNOSIS — O360110 Maternal care for anti-D [Rh] antibodies, first trimester, not applicable or unspecified: Secondary | ICD-10-CM

## 2020-11-07 DIAGNOSIS — O99019 Anemia complicating pregnancy, unspecified trimester: Secondary | ICD-10-CM

## 2020-11-07 MED ORDER — IRON POLYSACCH CMPLX-B12-FA 150-0.025-1 MG PO CAPS: 1.0000 | ORAL_CAPSULE | Freq: Every day | ORAL | 11 refills | Status: DC

## 2020-11-10 ENCOUNTER — Encounter: Payer: Self-pay | Admitting: Nurse Practitioner

## 2020-11-17 ENCOUNTER — Other Ambulatory Visit: Payer: Self-pay

## 2020-11-17 ENCOUNTER — Telehealth: Payer: Self-pay

## 2020-11-17 DIAGNOSIS — Z3401 Encounter for supervision of normal first pregnancy, first trimester: Secondary | ICD-10-CM

## 2020-11-17 NOTE — Telephone Encounter (Signed)
Referral place 

## 2020-11-23 ENCOUNTER — Ambulatory Visit: Payer: Medicaid Other | Admitting: *Deleted

## 2020-11-23 ENCOUNTER — Ambulatory Visit: Payer: Medicaid Other | Attending: Obstetrics

## 2020-11-23 ENCOUNTER — Encounter: Payer: Self-pay | Admitting: *Deleted

## 2020-11-23 ENCOUNTER — Other Ambulatory Visit: Payer: Self-pay

## 2020-11-23 ENCOUNTER — Ambulatory Visit (HOSPITAL_BASED_OUTPATIENT_CLINIC_OR_DEPARTMENT_OTHER): Payer: Medicaid Other | Admitting: Obstetrics

## 2020-11-23 ENCOUNTER — Other Ambulatory Visit: Payer: Self-pay | Admitting: *Deleted

## 2020-11-23 VITALS — BP 147/77 | HR 85

## 2020-11-23 DIAGNOSIS — O09299 Supervision of pregnancy with other poor reproductive or obstetric history, unspecified trimester: Secondary | ICD-10-CM

## 2020-11-23 DIAGNOSIS — Z3401 Encounter for supervision of normal first pregnancy, first trimester: Secondary | ICD-10-CM | POA: Insufficient documentation

## 2020-11-23 DIAGNOSIS — Z3A13 13 weeks gestation of pregnancy: Secondary | ICD-10-CM | POA: Diagnosis not present

## 2020-11-23 DIAGNOSIS — O10911 Unspecified pre-existing hypertension complicating pregnancy, first trimester: Secondary | ICD-10-CM | POA: Diagnosis not present

## 2020-11-23 DIAGNOSIS — O361120 Maternal care for Anti-A sensitization, second trimester, not applicable or unspecified: Secondary | ICD-10-CM | POA: Insufficient documentation

## 2020-11-23 DIAGNOSIS — O99212 Obesity complicating pregnancy, second trimester: Secondary | ICD-10-CM | POA: Insufficient documentation

## 2020-11-23 DIAGNOSIS — Z98891 History of uterine scar from previous surgery: Secondary | ICD-10-CM

## 2020-11-23 DIAGNOSIS — O10912 Unspecified pre-existing hypertension complicating pregnancy, second trimester: Secondary | ICD-10-CM

## 2020-11-23 DIAGNOSIS — O0990 Supervision of high risk pregnancy, unspecified, unspecified trimester: Secondary | ICD-10-CM | POA: Insufficient documentation

## 2020-11-23 DIAGNOSIS — O99211 Obesity complicating pregnancy, first trimester: Secondary | ICD-10-CM | POA: Insufficient documentation

## 2020-11-23 DIAGNOSIS — O361110 Maternal care for Anti-A sensitization, first trimester, not applicable or unspecified: Secondary | ICD-10-CM

## 2020-11-25 NOTE — Progress Notes (Signed)
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  She is currently treated with labetalol 300 mg twice a day and Procardia 30 mg daily.    The patient reports that she was diagnosed with chronic hypertension about 1 year ago following the delivery of her last child.  She was started on medications for treatment of chronic hypertension at around 6 weeks of her current pregnancy.    She reports that her last pregnancy where she carried to term in August 2021, was also complicated by gestational diabetes that required metformin for treatment.  She was transfused 2 units of packed red blood cells following that delivery. Her hemoglobin A1c drawn earlier in her current pregnancy was 6.1%.  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  She and her partner, Marilyn Cooper have conceived 5 prior pregnancies together.  Her blood type is A positive.  Due to maternal obesity, she has not had a cell free DNA test drawn yet in her current pregnancy.  The cell free DNA test is planned to be drawn at 16 weeks.  On today's ultrasound exam, a viable singleton intrauterine pregnancy is noted with a crown-rump length that is consistent with an EDC of May 25, 2021.  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  They were advised that she may have been exposed to the Duffy antigen either from her prior pregnancies or during her blood transfusion.  She was advised that should the fetus that she is carrying have the Duffy antigen on its red blood cells, that the  anti-Duffy antibodies in her blood may cross over the placenta and cause fetal anemia.    As it is uncertain whether she was exposed to the Duffy antigen from her prior pregnancies or from her blood transfusion, her partner Marilyn Cooper, had his blood drawn following today's exam to determine if he carries the Duffy antigen on his red blood cells.  She was advised that should he be negative for the Duffy antigen, her baby should not be at risk for anemia.  Should the father be positive for the Duffy antigen, Marilyn Cooper should be followed with monthly blood tests to determine the anti-Duffy antibody titer level.  Should there be a progressive increase in the anti-Duffy antibody titer levels or should the anti-Duffy antibody titer levels reach a critical titer of 1:16 or greater, we will follow her with frequent MCA Doppler studies to screen for fetal anemia.  The patient and her partner understand that should fetal anemia be suspected during her future exams, that an intrauterine fetal blood transfusion or delivery depending on her gestational age may be indicated.  Chronic hypertension in pregnancy  The implications and management of chronic hypertension in pregnancy was discussed. The patient was advised that should her blood pressures be persistently elevated above the 140s over 90s range, the dosage of her antihypertensive medications may need to be increased.    The increased risk of superimposed preeclampsia, an indicated preterm delivery, and possible fetal growth restriction due to chronic hypertension in pregnancy was discussed.  She should have a baseline P/C ratio performed at her next prenatal visit.  We will continue to follow her with monthly growth scans. Weekly fetal testing should be started at around 32 weeks.   To decrease her risk of superimposed preeclampsia, she should start taking a daily baby aspirin (81 mg daily) for preeclampsia prophylaxis.  The patient reports that she  will start taking a daily baby aspirin tomorrow.  Obesity in pregnancy  The patient was advised that maternal obesity increases the risk of adverse pregnancy outcomes such as miscarriages/stillbirth, congenital anomalies such as neural tube defects/spina bifida, and may increase the risk of preeclampsia and gestational diabetes requiring an indicated preterm birth.  The recommended total weight gain in pregnancy for obese women's between 10 to 20 pounds.  Due to obesity, an early screen for gestational diabetes at between 15 to 16 weeks is recommended.  If the initial early diabetes screening result is negative, a repeat diabetes screen is recommended again at 24 to 28 weeks.  Due to maternal obesity, a detailed fetal anatomy scan has been scheduled for her at around 19 weeks to screen for fetal anomalies.    As maternal obesity may present challenges associated with the management of anesthesia, an anesthesia consult should be obtained when she is admitted in labor.  At the end of the consultation, the patient stated that all her questions have been answered to her complete satisfaction.  A total of 50 minutes was spent counseling and coordinating the care for this patient.  Greater than 50% of the time was spent in direct face-to-face contact.  Recommendations: Determine if her partner carries the Duffy antigen Obtain baseline P/C ratio Start daily baby aspirin (81 mg daily) for preeclampsia prophylaxis Continue labetalol and Procardia for blood pressure control Early screen for gestational diabetes Detailed fetal anatomy scan at 19 weeks MCA Doppler studies as indicated Monthly growth ultrasounds Start weekly fetal testing at 32 weeks

## 2020-11-27 ENCOUNTER — Other Ambulatory Visit: Payer: Self-pay

## 2020-11-29 NOTE — Progress Notes (Addendum)
11/29/2020 Marilyn Cooper YI:[id number] 1990-06-04   CHIEF COMPLAINT: Abnormal abdominal CT scan   HISTORY OF PRESENT ILLNESS: Marilyn Cooper is a 30 year old female with a past medical history of anxiety, depression, obesity, asthma, hypertension, gestational diabetes, + anti-Duffy antibodies, anemia since childhood which required PRBC transfusion x 2 after her 3rd baby was delivered 08/2019 and a large ventral hernia. S/P exploratory laparotomy, small bowel resection with repair of an incarcerated umbilical/ventral hernia in [id number], C-section complicated by bladder injury status postrepair in 2021 and C-sections in 2014 and 2016.  She is currently [redacted] weeks gestation.   She was seen by surgeon Dr. Romana Juniper 09/04/2020 following an emergency room visit to due to having N/V and abdominal pain in the setting of a large ventral hernia.  An abdominal/pelvic CT in the ED 08/05/2020 identified a ventral hernia approximately 8 cm wide by 6 cm containing small bowel, transverse colon without obstruction or incarceration and an adjacent very small fat-containing ventral hernia.  Hepatosplenomegaly and probable hepatic steatosis and focal narrowing of the rectosigmoid colon indeterminate for focal spasm or stricture.   She presents to our office today as referred by Dr. Romana Juniper for further evaluation regarding a possible rectosigmoid stricture identified on an abdominal/pelvic CT scan completed 08/05/2020.  A diagnostic colonoscopy was recommended.  However, since Dr. Windle Guard initiated this referral the patient became pregnant.  She is currently [redacted] weeks gestation.  She continues to have central upper abdominal pain surrounding the ventral hernia which comes and goes.  She has intermittent nausea without vomiting.  She has intermittent nonbloody diarrhea which started July 2022 and significantly improved after she started taking fiber Gummies.  However, if she stops taking the fiber Gummies her  diarrhea recurs.  She typically passes 3 formed brown stools daily as long as she takes a fiber supplement.  No rectal bleeding or melena.  She intends to undergo eventual ventral hernia repair after her baby is born.  No family history of IBD, celiac disease or IBD.  She reports having anemia since childhood.  She takes iron p.o. daily and a prenatal vitamin daily. Her mother, sister and 2 maternal aunts also have similar anemia.  Further details are unknown clear.  CBC Latest Ref Rng & Units 11/04/2020 08/05/2020 04/29/2020  WBC 3.4 - 10.8 x10E3/uL 8.0 6.9 8.6  Hemoglobin 11.1 - 15.9 g/dL 10.3(L) 10.2(L) 8.3(L)  Hematocrit 34.0 - 46.6 % 33.8(L) 35.3(L) 27.9(L)  Platelets 150 - 450 x10E3/uL 317 337 342  MCV 68.   CMP Latest Ref Rng & Units 11/04/2020 08/05/2020 04/15/2020  Glucose 70 - 99 mg/dL 137(H) 125(H) 183(H)  BUN 6 - 20 mg/dL 6 10 6   Creatinine 0.57 - 1.00 mg/dL 0.56(L) 0.68 0.80  Sodium 134 - 144 mmol/L 135 137 137  Potassium 3.5 - 5.2 mmol/L 4.4 3.6 3.4(L)  Chloride 96 - 106 mmol/L 101 110 108  CO2 20 - 29 mmol/L 17(L) 19(L) 19(L)  Calcium 8.7 - 10.2 mg/dL 9.5 9.3 8.9  Total Protein 6.0 - 8.5 g/dL 7.4 8.1 6.8  Total Bilirubin 0.0 - 1.2 mg/dL <0.2 0.3 0.2(L)  Alkaline Phos 44 - 121 IU/L 96 103 67  AST 0 - 40 IU/L 11 20 16   ALT 0 - 32 IU/L 9 17 14   Hep C antibody negative and Hepatitis B surface antigen negative on 11/04/2020.  CTAP with contrast 08/05/2020: 1. Moderate ventral hernia containing mesenteric fat small bowel and transverse colon but no evidence for  obstruction or incarceration. Adjacent smaller fat containing ventral hernia. 2. Hepatosplenomegaly with probable steatosis 3. Focal narrowing at the rectosigmoid colon indeterminate for focal spasm or stricture. Follow-up outpatient colonoscopy may be considered.   Past Medical History:  Diagnosis Date   Anemia    Anxiety    Asthma    childhood   Chronic hypertension    Depression    see social work consult,  doing really well right now 'is in a good place" 3/29   Gestational diabetes    Hx of varicella    Obese    Past Surgical History:  Procedure Laterality Date   BLADDER REPAIR N/A 08/29/2019   Procedure: BLADDER REPAIR;  Surgeon: Waynard Reeds, MD;  Location: [facility];  Service: Gynecology;  Laterality: N/A;   CESAREAN SECTION N/A 03/09/2012   Procedure: CESAREAN SECTION;  Surgeon: Freddrick March. Tenny Craw, MD;  Location: [facility];  Service: Obstetrics;  Laterality: N/A;   CESAREAN SECTION N/A 12/03/2014   Procedure: CESAREAN SECTION;  Surgeon: Marlow Baars, MD;  Location: [facility];  Service: Obstetrics;  Laterality: N/A;   CESAREAN SECTION N/A 08/29/2019   Procedure: CESAREAN SECTION;  Surgeon: Waynard Reeds, MD;  Location: [facility];  Service: Obstetrics;  Laterality: N/A;   COLPOSCOPY     CYSTOSCOPY N/A 08/29/2019   Procedure: CYSTOSCOPY;  Surgeon: Waynard Reeds, MD;  Location: [facility];  Service: Gynecology;  Laterality: N/A;   HERNIA REPAIR     LAPAROTOMY N/A 08/29/2019   Procedure: EXPLORATORY LAPAROTOMY;  Surgeon: Waynard Reeds, MD;  Location: [facility];  Service: Gynecology;  Laterality: N/A;   TONSILLECTOMY     UMBILICAL HERNIA REPAIR N/A 06/21/2014   Procedure: EXPLORATORY LAPAROTOMY,  SMALL BOWEL RESECTION, PRIMARY REPAIR INCARCERATED VENTRAL HERNIA;  Surgeon: Darnell Level, MD;  Location: [facility];  Service: General;  Laterality: N/A;  Patient denies any problems with sedation/anesthesia or difficulties with intubation/airway management  Social History: She is married. She has 3 children. Nonsmoker. Rare alcohol use, one drink twice yearly. No drug use.   Family History:family history includes Anemia in her mother and sister; Birth defects in her brother; Diabetes in her father, maternal grandfather, and maternal grandmother; Hypertension in her maternal grandfather, maternal grandmother, paternal grandfather, and paternal grandmother.  No known family history of IBD, esophageal, gastric or colon cancer.    No Known Allergies   Outpatient Encounter Medications as of 11/30/2020  Medication Sig   aspirin 81 MG chewable tablet Chew 1 tablet (81 mg total) by mouth daily.   Iron Polysacch Cmplx-B12-FA 150-0.025-1 MG CAPS Take 1 capsule by mouth daily before breakfast.   labetalol (NORMODYNE) 300 MG tablet 2 (two) times daily.   NIFEdipine (PROCARDIA XL) 30 MG 24 hr tablet Take 1 tablet (30 mg total) by mouth daily.   Prenatal Vit-Fe Fumarate-FA (PRENATAL VITAMIN PO) Take 1 tablet by mouth daily.   No facility-administered encounter medications on file as of 11/30/2020.   REVIEW OF SYSTEMS:  Gen: Denies fever, sweats or chills. No weight loss.  CV: Denies chest pain, palpitations or edema. Resp: Denies cough, shortness of breath of hemoptysis.  GI: See HPI.  No GERD symptoms. GU : Denies urinary burning, blood in urine, increased urinary frequency or incontinence. MS: Denies joint pain, muscles aches or weakness. Derm: Denies rash, itchiness, skin lesions or unhealing ulcers. Psych: + Depression. No memory loss.  Heme: Denies bruising, bleeding. Neuro:  Denies headaches, dizziness or paresthesias. Endo:  Prior gestational DM.   PHYSICAL EXAM:  LMP 08/10/2020 Comment: neg preg test BP 118/60   Pulse 99   Ht 5\' 1"  (1.549 m)   Wt 291 lb (132 kg)   LMP 08/10/2020 Comment: neg preg test  SpO2 99%   BMI 54.98 kg/m   Wt Readings from Last 3 Encounters:  11/30/20 291 lb (132 kg)  11/04/20 287 lb 3.2 oz (130.3 kg)  10/21/20 283 lb 9.6 oz (128.6 kg)  General: Obese 30 year old female in no acute distress. Head: Normocephalic and atraumatic. Eyes:  Sclerae non-icteric, conjunctive pink. Ears: Normal auditory acuity. Mouth: Dentition intact. No ulcers or lesions.  Neck: Supple, no lymphadenopathy or thyromegaly.  Lungs: Clear bilaterally to auscultation without wheezes, crackles or rhonchi. Heart: Regular rate and rhythm. No murmur, rub or gallop appreciated.  Abdomen: Soft, nontender,  non distended. No masses. No hepatosplenomegaly. Normoactive bowel sounds x 4 quadrants.  Ventral hernia protrudes, nontender, partially reducible. Rectal: Deferred Musculoskeletal: Symmetrical with no gross deformities. Skin: Warm and dry. No rash or lesions on visible extremities. Extremities: No edema. Neurological: Alert oriented x 4, no focal deficits.  Psychological:  Alert and cooperative. Normal mood and affect.  ASSESSMENT AND PLAN:  22) 30 year old female [redacted] weeks gestation presents for further evaluation regarding an abnormal abd/pelvic 08/05/2020 which showed focal narrowing at the rectosigmoid colon indeterminate for focal spasm or stricture.  Intermittent diarrhea resolved after initiating fiber gummies. -Diagnostic colonoscopy deferred as the patient is [redacted] weeks gestation -Patient to follow-up in our office postpartum and a diagnostic colonoscopy will be further discussed at that time -She will contact her office if she develops recurrent diarrhea or any abdominal pain -To the ED if severe abdominal pain develops  2) History of microcytic anemia on oral iron. Significant family history of anemia. Splenomegaly per CTAP 08/05/2020. Hg 10.3. MCV 68.  -Continue follow-up with OB, she is due for laboratory studies next week and I recommend checking a celiac panel with this lab draw due  -Consider hematology referral  3) Hepatosplenomegaly with possible hepatic steatosis per CTAP 08/05/2020.  Normal LFTs.  Hep B surface antigen negative.  Hepatitis C antibody negative. -I discussed the risk of fatty liver disease in the setting of hepatic steatosis, hepatosplenomegaly likely due to obesity -Avoid unnecessary weight gain during this pregnancy -Recommend follow-up in our office postpartum to reassess weight and to initiate referral to [place] [facility]  4) Large ventral hernia containing mesenteric fat small bowel and transverse colon without evidence of an  obstruction or incarceration -Follow up with general surgery post partum      CC:  No ref. provider found

## 2020-11-30 ENCOUNTER — Ambulatory Visit: Payer: Medicaid Other | Admitting: Nurse Practitioner

## 2020-11-30 ENCOUNTER — Encounter: Payer: Self-pay | Admitting: Nurse Practitioner

## 2020-11-30 VITALS — BP 118/60 | HR 99 | Ht 61.0 in | Wt 291.0 lb

## 2020-11-30 DIAGNOSIS — F324 Major depressive disorder, single episode, in partial remission: Secondary | ICD-10-CM | POA: Insufficient documentation

## 2020-11-30 DIAGNOSIS — K439 Ventral hernia without obstruction or gangrene: Secondary | ICD-10-CM | POA: Diagnosis not present

## 2020-11-30 DIAGNOSIS — K760 Fatty (change of) liver, not elsewhere classified: Secondary | ICD-10-CM | POA: Diagnosis not present

## 2020-11-30 DIAGNOSIS — R197 Diarrhea, unspecified: Secondary | ICD-10-CM | POA: Diagnosis not present

## 2020-11-30 DIAGNOSIS — R935 Abnormal findings on diagnostic imaging of other abdominal regions, including retroperitoneum: Secondary | ICD-10-CM

## 2020-11-30 DIAGNOSIS — D509 Iron deficiency anemia, unspecified: Secondary | ICD-10-CM

## 2020-11-30 HISTORY — DX: Major depressive disorder, single episode, in partial remission: F32.4

## 2020-11-30 NOTE — Patient Instructions (Addendum)
If you are age 29 or younger, your body mass index should be between 19-25. Your Body mass index is 54.98 kg/m. If this is out of the aformentioned range listed, please consider follow up with your Primary Care Provider.   The [facility] providers would like to encourage you to use [facility] to communicate with providers for non-urgent requests or questions.  Due to long hold times on the telephone, sending your provider a message by [facility] may be faster and more efficient way to get a response. Please allow 48 business hours for a response.  Please remember that this is for non-urgent requests/questions..   RECOMMENDATIONS: Have a celiac panel with your next OB lab draw. Follow up in May 2023 post partum. Please contact us if you develop any nausea or vomiting, or change in bowel pattern.  It was great seeing you today! Thank you for entrusting me with your care and choosing [facility].  Arnaldo Natal, CRNP

## 2020-12-01 ENCOUNTER — Other Ambulatory Visit: Payer: Self-pay

## 2020-12-01 ENCOUNTER — Encounter: Payer: Self-pay | Admitting: Obstetrics and Gynecology

## 2020-12-01 ENCOUNTER — Encounter: Payer: Medicaid Other | Admitting: Obstetrics

## 2020-12-01 ENCOUNTER — Ambulatory Visit (INDEPENDENT_AMBULATORY_CARE_PROVIDER_SITE_OTHER): Payer: Medicaid Other | Admitting: Obstetrics and Gynecology

## 2020-12-01 VITALS — BP 145/79 | HR 90 | Wt 289.6 lb

## 2020-12-01 DIAGNOSIS — O99210 Obesity complicating pregnancy, unspecified trimester: Secondary | ICD-10-CM | POA: Insufficient documentation

## 2020-12-01 DIAGNOSIS — O34219 Maternal care for unspecified type scar from previous cesarean delivery: Secondary | ICD-10-CM | POA: Insufficient documentation

## 2020-12-01 DIAGNOSIS — O10919 Unspecified pre-existing hypertension complicating pregnancy, unspecified trimester: Secondary | ICD-10-CM

## 2020-12-01 DIAGNOSIS — O0990 Supervision of high risk pregnancy, unspecified, unspecified trimester: Secondary | ICD-10-CM

## 2020-12-01 NOTE — Progress Notes (Signed)
   PRENATAL VISIT NOTE  Subjective:  Marilyn Cooper is a 30 y.o. [id number] at [redacted]w[redacted]d being seen today for ongoing prenatal care.  She is currently monitored for the following issues for this high-risk pregnancy and has Generalized anxiety disorder; Weight gain; Incarcerated umbilical hernia; Hypertension in pregnancy, transient, antepartum; Panic disorder; Depression; Chronic hypertension affecting pregnancy; Gestational diabetes; SAB (spontaneous abortion); Depressed mood; Supervision of high risk pregnancy, antepartum; Major depressive disorder with single episode, in partial remission (HCC); Maternal obesity affecting pregnancy, antepartum; and Previous cesarean delivery affecting pregnancy on their problem list.  Patient reports no complaints.  Contractions: Not present. Vag. Bleeding: None.  Movement: Absent. Denies leaking of fluid.   The following portions of the patient's history were reviewed and updated as appropriate: allergies, current medications, past family history, past medical history, past social history, past surgical history and problem list.   Objective:   Vitals:   12/01/20 0938  BP: (!) 145/79  Pulse: 90  Weight: 289 lb 9.6 oz (131.4 kg)    Fetal Status: Fetal Heart Rate (bpm): 143   Movement: Absent     General:  Alert, oriented and cooperative. Patient is in no acute distress.  Skin: Skin is warm and dry. No rash noted.   Cardiovascular: Normal heart rate noted  Respiratory: Normal respiratory effort, no problems with respiration noted  Abdomen: Soft, gravid, appropriate for gestational age.  Pain/Pressure: Absent     Pelvic: Cervical exam deferred        Extremities: Normal range of motion.  Edema: None  Mental Status: Normal mood and affect. Normal behavior. Normal judgment and thought content.   Assessment and Plan:  Pregnancy: [id number] at [redacted]w[redacted]d 1. Supervision of high risk pregnancy, antepartum Patient is doing well without complaints Patient to return on  Monday for early glucola, panorama, AFP and repeat ab titer Follow up anatomy ultrasound scheduled 12/12  2. Chronic hypertension affecting pregnancy Stable on labetalol and procardia  3. Maternal obesity affecting pregnancy, antepartum Continue ASA  4. Previous cesarean delivery affecting pregnancy Will be scheduled for repeat   Preterm labor symptoms and general obstetric precautions including but not limited to vaginal bleeding, contractions, leaking of fluid and fetal movement were reviewed in detail with the patient. Please refer to After Visit Summary for other counseling recommendations.   Return in about 4 weeks (around 12/29/2020) for in person, ROB, High risk.  Future Appointments  Date Time Provider Department Center  12/07/2020  8:15 AM [facility] LAB [facility] None  12/28/2020  8:45 AM [facility] NURSE [facility]  12/28/2020  9:00 AM [facility] US1 [facility]  12/29/2020  8:35 AM Warden Fillers, MD [facility] None    Catalina Antigua, MD

## 2020-12-01 NOTE — Progress Notes (Signed)
Agree with the assessment and plan as outlined by Alcide Evener, NP.   Very low likelihood of malignancy in sigmoid colon given patient's young age.  Would recommend colonoscopy after patient is no longer pregnant   Rubens Cranston E. Tomasa Rand, MD [facility]

## 2020-12-07 ENCOUNTER — Other Ambulatory Visit: Payer: Self-pay

## 2020-12-07 ENCOUNTER — Other Ambulatory Visit: Payer: Medicaid Other

## 2020-12-07 DIAGNOSIS — O0990 Supervision of high risk pregnancy, unspecified, unspecified trimester: Secondary | ICD-10-CM

## 2020-12-07 DIAGNOSIS — O24912 Unspecified diabetes mellitus in pregnancy, second trimester: Secondary | ICD-10-CM

## 2020-12-09 DIAGNOSIS — O24912 Unspecified diabetes mellitus in pregnancy, second trimester: Secondary | ICD-10-CM | POA: Insufficient documentation

## 2020-12-09 LAB — AFP, SERUM, OPEN SPINA BIFIDA
AFP MoM: 0.69
AFP Value: 15.7 ng/mL
Gest. Age on Collection Date: 15.6 weeks
Maternal Age At EDD: 30.2 yr
OSBR Risk 1 IN: 10000
Test Results:: NEGATIVE
Weight: 291 [lb_av]

## 2020-12-09 LAB — GLUCOSE TOLERANCE, 2 HOURS W/ 1HR
Glucose, 1 hour: 252 mg/dL — ABNORMAL HIGH (ref 70–179)
Glucose, 2 hour: 157 mg/dL — ABNORMAL HIGH (ref 70–152)
Glucose, Fasting: 129 mg/dL — ABNORMAL HIGH (ref 70–91)

## 2020-12-15 ENCOUNTER — Other Ambulatory Visit: Payer: Self-pay

## 2020-12-15 DIAGNOSIS — O24419 Gestational diabetes mellitus in pregnancy, unspecified control: Secondary | ICD-10-CM

## 2020-12-15 MED ORDER — ACCU-CHEK SOFTCLIX LANCETS MISC
12 refills | Status: DC
Start: 2020-12-15 — End: 2021-05-06

## 2020-12-15 MED ORDER — ACCU-CHEK GUIDE VI STRP: ORAL_STRIP | 12 refills | Status: DC

## 2020-12-15 MED ORDER — ACCU-CHEK GUIDE W/DEVICE KIT: 1.0000 | PACK | Freq: Four times a day (QID) | 0 refills | Status: DC

## 2020-12-16 ENCOUNTER — Encounter: Payer: Self-pay | Admitting: Obstetrics and Gynecology

## 2020-12-28 ENCOUNTER — Other Ambulatory Visit: Payer: Self-pay

## 2020-12-28 ENCOUNTER — Encounter: Payer: Self-pay | Admitting: *Deleted

## 2020-12-28 ENCOUNTER — Ambulatory Visit: Payer: Medicaid Other | Attending: Obstetrics

## 2020-12-28 ENCOUNTER — Other Ambulatory Visit: Payer: Self-pay | Admitting: *Deleted

## 2020-12-28 ENCOUNTER — Ambulatory Visit (HOSPITAL_BASED_OUTPATIENT_CLINIC_OR_DEPARTMENT_OTHER): Payer: Medicaid Other | Admitting: Obstetrics and Gynecology

## 2020-12-28 ENCOUNTER — Ambulatory Visit: Payer: Medicaid Other | Admitting: *Deleted

## 2020-12-28 ENCOUNTER — Encounter: Payer: Self-pay | Admitting: Obstetrics and Gynecology

## 2020-12-28 ENCOUNTER — Other Ambulatory Visit: Payer: Self-pay | Admitting: Obstetrics

## 2020-12-28 VITALS — BP 128/76 | HR 93

## 2020-12-28 DIAGNOSIS — O10912 Unspecified pre-existing hypertension complicating pregnancy, second trimester: Secondary | ICD-10-CM | POA: Diagnosis present

## 2020-12-28 DIAGNOSIS — D6861 Antiphospholipid syndrome: Secondary | ICD-10-CM

## 2020-12-28 DIAGNOSIS — Z98891 History of uterine scar from previous surgery: Secondary | ICD-10-CM

## 2020-12-28 DIAGNOSIS — O99210 Obesity complicating pregnancy, unspecified trimester: Secondary | ICD-10-CM | POA: Diagnosis present

## 2020-12-28 DIAGNOSIS — O09292 Supervision of pregnancy with other poor reproductive or obstetric history, second trimester: Secondary | ICD-10-CM | POA: Diagnosis not present

## 2020-12-28 DIAGNOSIS — R768 Other specified abnormal immunological findings in serum: Secondary | ICD-10-CM

## 2020-12-28 DIAGNOSIS — Z8632 Personal history of gestational diabetes: Secondary | ICD-10-CM | POA: Insufficient documentation

## 2020-12-28 DIAGNOSIS — Z6841 Body Mass Index (BMI) 40.0 and over, adult: Secondary | ICD-10-CM

## 2020-12-28 DIAGNOSIS — O0990 Supervision of high risk pregnancy, unspecified, unspecified trimester: Secondary | ICD-10-CM | POA: Diagnosis present

## 2020-12-28 DIAGNOSIS — O34219 Maternal care for unspecified type scar from previous cesarean delivery: Secondary | ICD-10-CM | POA: Diagnosis not present

## 2020-12-28 DIAGNOSIS — O10012 Pre-existing essential hypertension complicating pregnancy, second trimester: Secondary | ICD-10-CM | POA: Diagnosis not present

## 2020-12-28 DIAGNOSIS — O09899 Supervision of other high risk pregnancies, unspecified trimester: Secondary | ICD-10-CM

## 2020-12-28 DIAGNOSIS — O99119 Other diseases of the blood and blood-forming organs and certain disorders involving the immune mechanism complicating pregnancy, unspecified trimester: Secondary | ICD-10-CM

## 2020-12-28 DIAGNOSIS — O99212 Obesity complicating pregnancy, second trimester: Secondary | ICD-10-CM

## 2020-12-28 DIAGNOSIS — O09299 Supervision of pregnancy with other poor reproductive or obstetric history, unspecified trimester: Secondary | ICD-10-CM | POA: Diagnosis present

## 2020-12-28 DIAGNOSIS — Z3A18 18 weeks gestation of pregnancy: Secondary | ICD-10-CM | POA: Insufficient documentation

## 2020-12-28 DIAGNOSIS — O24419 Gestational diabetes mellitus in pregnancy, unspecified control: Secondary | ICD-10-CM | POA: Diagnosis not present

## 2020-12-28 DIAGNOSIS — E669 Obesity, unspecified: Secondary | ICD-10-CM

## 2020-12-28 NOTE — Progress Notes (Signed)
Maternal-Fetal Medicine   Name: Marilyn Cooper DOB: 05/18/1990 MRN: [id number] Referring Provider: Lynnda Shields, MD  I had the pleasure of seeing Marilyn Cooper today at the [place] [facility].  She returned for detailed fetal anatomical survey.  [facility] had [facility] consultation at her previous visit.  Her problems include: -Chronic hypertension.  Patient takes labetalol and nifedipine.  Blood pressure today at her office is 128/76 mmHg. -Newly diagnosed gestational diabetes. Patient checks her blood glucose and reports her fasting levels are slightly high. She has not met with our diabetic educator and does not feel that she needs to see one since she had gestational diabetes in her previous pregnancy. 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 -Class III obesity (BMI 54). -Previous 3 cesarean deliveries.  Ultrasound We performed fetal anatomical survey.  Single umbilical artery (SUA) is seen.  No other markers of aneuploidies or fetal structural defects are seen.  Fetal biometry is consistent with the previously established dates.  Amniotic fluid is normal and good fetal activity seen. Placenta is anterior and there is no evidence of placenta previa or placenta accreta spectrum. Middle cerebral artery (MCA) Doppler showed normal peak systolic velocity measurements (no evidence of fetal anemia). As maternal obesity imposes limitations on the resolution of images, fetal anomalies may be missed and we will reconfirm SUA at her next visit.  I counseled the patient on the following: Single umbilical artery SUA is seen in about 0.25% to 1% of fetuses. In the absence of other anomalies, the risk for aneuploidies is not increased. However, ultrasound has limitations in detecting fetal anomalies that may be missed on target at anatomical survey.  SUA can be associated with fetal growth restriction and we recommend serial growth scans for fetal growth assessment.  The association of SUA with cardiac anomalies is not conclusively proven.    I discussed the significance and limitations of cell free fetal DNA screening in detecting aneuploidies.  I informed her only amniocentesis will give a definitive result on the fetal karyotype and some genetic conditions.   Patient opted not to have amniocentesis.  Gestational diabetes I explained the diagnosis of gestational diabetes.  I emphasized the importance of good blood glucose control to prevent adverse fetal or neonatal outcomes.  I discussed blood glucose normal values. I encouraged her to check her blood glucose regularly. Possible complications of gestational diabetes include fetal macrosomia, shoulder dystocia and birth injuries, stillbirth (in poorly controlled diabetes) and neonatal respiratory syndrome and other complications.  In about 85% of cases, gestational diabetes is well controlled by diet alone.  Exercise reduces the need for insulin.  Medical treatment includes oral hypoglycemics or insulin. Patient will opt for metformin first before insulin.  Timing of delivery: Because of comorbid conditions of chronic hypertension and diabetes, consider delivery at [redacted] weeks gestation provided diabetes and hypertension are well controlled.   Type 2 diabetes develops in about 25% to 40% of women with GDM. I recommend postpartum screening with 75-g glucose load at 6 to 12 weeks after delivery.  Recommendations -An appointment was made for her to return in 4 weeks for completion of fetal anatomy.  Reconfirm single umbilical artery. -MCA Doppler study next week. -Fetal growth assessments every 4 weeks. -Weekly BPP from [redacted] weeks gestation till delivery. -Delivery at [redacted] weeks gestation provider hypertension and diabetes are well controlled.  Thank you for consultation.  If you have any questions or concerns, please contact me the [facility].  Consultation including face-to-face (more than 50%)  counseling 30 minutes.

## 2020-12-29 ENCOUNTER — Ambulatory Visit (INDEPENDENT_AMBULATORY_CARE_PROVIDER_SITE_OTHER): Payer: Medicaid Other | Admitting: Obstetrics and Gynecology

## 2020-12-29 VITALS — BP 129/85 | HR 86 | Wt 287.4 lb

## 2020-12-29 DIAGNOSIS — O10919 Unspecified pre-existing hypertension complicating pregnancy, unspecified trimester: Secondary | ICD-10-CM

## 2020-12-29 DIAGNOSIS — O0990 Supervision of high risk pregnancy, unspecified, unspecified trimester: Secondary | ICD-10-CM

## 2020-12-29 DIAGNOSIS — Z3A19 19 weeks gestation of pregnancy: Secondary | ICD-10-CM | POA: Insufficient documentation

## 2020-12-29 DIAGNOSIS — Z6841 Body Mass Index (BMI) 40.0 and over, adult: Secondary | ICD-10-CM | POA: Insufficient documentation

## 2020-12-29 DIAGNOSIS — O34219 Maternal care for unspecified type scar from previous cesarean delivery: Secondary | ICD-10-CM

## 2020-12-29 DIAGNOSIS — O99210 Obesity complicating pregnancy, unspecified trimester: Secondary | ICD-10-CM

## 2020-12-29 DIAGNOSIS — O24912 Unspecified diabetes mellitus in pregnancy, second trimester: Secondary | ICD-10-CM

## 2020-12-29 DIAGNOSIS — Z98891 History of uterine scar from previous surgery: Secondary | ICD-10-CM | POA: Insufficient documentation

## 2020-12-29 MED ORDER — METFORMIN HCL 500 MG PO TABS: 500.0000 mg | ORAL_TABLET | Freq: Every day | ORAL | 5 refills | Status: DC

## 2020-12-29 NOTE — Progress Notes (Signed)
ROB 19 wks No complaints. Wants to talk about metformin: CBG's AM fasting not below 105, 2hr PP are below 120 (96-116)

## 2020-12-29 NOTE — Progress Notes (Signed)
° °  PRENATAL VISIT NOTE  Subjective:  Marilyn Cooper is a 30 y.o. [id number] at [redacted]w[redacted]d being seen today for ongoing prenatal care.  She is currently monitored for the following issues for this high-risk pregnancy and has Generalized anxiety disorder; Weight gain; Incarcerated umbilical hernia; Hypertension in pregnancy, transient, antepartum; Panic disorder; Depression; Chronic hypertension affecting pregnancy; Gestational diabetes; SAB (spontaneous abortion); Depressed mood; Supervision of high risk pregnancy, antepartum; Major depressive disorder with single episode, in partial remission (HCC); Maternal obesity affecting pregnancy, antepartum; Previous cesarean delivery affecting pregnancy; Diabetes mellitus affecting pregnancy in second trimester; BMI 50.0-59.9, adult (HCC); [redacted] weeks gestation of pregnancy; and History of 3 cesarean sections on their problem list.  Patient doing well with no acute concerns today. She reports no complaints.  Contractions: Not present. Vag. Bleeding: None.  Movement: Present. Denies leaking of fluid.   The following portions of the patient's history were reviewed and updated as appropriate: allergies, current medications, past family history, past medical history, past social history, past surgical history and problem list. Problem list updated.  Objective:   Vitals:   12/29/20 0831  BP: 129/85  Pulse: 86  Weight: 287 lb 6.4 oz (130.4 kg)    Fetal Status: Fetal Heart Rate (bpm): 152   Movement: Present     General:  Alert, oriented and cooperative. Patient is in no acute distress.  Skin: Skin is warm and dry. No rash noted.   Cardiovascular: Normal heart rate noted  Respiratory: Normal respiratory effort, no problems with respiration noted  Abdomen: Soft, gravid, appropriate for gestational age.  Pain/Pressure: Absent     Pelvic: Cervical exam deferred        Extremities: Normal range of motion.  Edema: None  Mental Status:  Normal mood and affect. Normal  behavior. Normal judgment and thought content.  Large pannus: slight erythema under pannus, pt advised to keep th area clean and dry to avoid infection Assessment and Plan:  Pregnancy: [id number] at [redacted]w[redacted]d  1. Chronic hypertension affecting pregnancy Pt compliant with medications, BP moderately well controlled  2. Supervision of high risk pregnancy, antepartum Continue routine care  3. Previous cesarean delivery affecting pregnancy   4. Maternal obesity affecting pregnancy, antepartum   5. BMI 50.0-59.9, adult (HCC)   6. Diabetes mellitus affecting pregnancy in second trimester Fasting blood sugar in the 115-120 range per pt, will start night time metformin at 500 mg per her request, check blood sugars in 3 weeks  7. [redacted] weeks gestation of pregnancy   8. History of 3 cesarean sections Will need repeat, discussed BTL, pt unsure, FOB considering vasectomy  Preterm labor symptoms and general obstetric precautions including but not limited to vaginal bleeding, contractions, leaking of fluid and fetal movement were reviewed in detail with the patient.  Please refer to After Visit Summary for other counseling recommendations.   Return in about 3 weeks (around 01/19/2021) for [facility], in person.   Mariel Aloe, MD Faculty Attending [facility]

## 2021-01-19 ENCOUNTER — Encounter: Payer: Medicaid Other | Admitting: Obstetrics & Gynecology

## 2021-01-21 ENCOUNTER — Ambulatory Visit (INDEPENDENT_AMBULATORY_CARE_PROVIDER_SITE_OTHER): Payer: Medicaid Other | Admitting: Obstetrics and Gynecology

## 2021-01-21 ENCOUNTER — Other Ambulatory Visit: Payer: Self-pay

## 2021-01-21 ENCOUNTER — Encounter: Payer: Self-pay | Admitting: Obstetrics and Gynecology

## 2021-01-21 VITALS — BP 157/95 | HR 101 | Wt 285.0 lb

## 2021-01-21 DIAGNOSIS — O24912 Unspecified diabetes mellitus in pregnancy, second trimester: Secondary | ICD-10-CM

## 2021-01-21 DIAGNOSIS — O10919 Unspecified pre-existing hypertension complicating pregnancy, unspecified trimester: Secondary | ICD-10-CM

## 2021-01-21 DIAGNOSIS — O34219 Maternal care for unspecified type scar from previous cesarean delivery: Secondary | ICD-10-CM

## 2021-01-21 DIAGNOSIS — O99210 Obesity complicating pregnancy, unspecified trimester: Secondary | ICD-10-CM

## 2021-01-21 DIAGNOSIS — O0990 Supervision of high risk pregnancy, unspecified, unspecified trimester: Secondary | ICD-10-CM

## 2021-01-21 MED ORDER — LABETALOL HCL 200 MG PO TABS: 400.0000 mg | ORAL_TABLET | Freq: Two times a day (BID) | ORAL | 3 refills | Status: DC

## 2021-01-21 MED ORDER — METFORMIN HCL 1000 MG PO TABS: 1000.0000 mg | ORAL_TABLET | Freq: Every day | ORAL | 2 refills | Status: DC

## 2021-01-21 NOTE — Progress Notes (Signed)
° °  PRENATAL VISIT NOTE  Subjective:  Marilyn Cooper is a 31 y.o. [id number] at [redacted]w[redacted]d being seen today for ongoing prenatal care.  She is currently monitored for the following issues for this high-risk pregnancy and has Generalized anxiety disorder; Incarcerated umbilical hernia; Panic disorder; Depression; Chronic hypertension affecting pregnancy; Depressed mood; Supervision of high risk pregnancy, antepartum; Major depressive disorder with single episode, in partial remission ([place]); Maternal obesity affecting pregnancy, antepartum; Previous cesarean delivery affecting pregnancy; and Diabetes mellitus affecting pregnancy in second trimester on their problem list.  Patient reports no complaints.  Contractions: Not present. Vag. Bleeding: None.  Movement: Present. Denies leaking of fluid.   The following portions of the patient's history were reviewed and updated as appropriate: allergies, current medications, past family history, past medical history, past social history, past surgical history and problem list.   Objective:   Vitals:   01/21/21 1314  BP: (!) 157/95  Pulse: (!) 101  Weight: 285 lb (129.3 kg)    Fetal Status: Fetal Heart Rate (bpm): 160   Movement: Present     General:  Alert, oriented and cooperative. Patient is in no acute distress.  Skin: Skin is warm and dry. No rash noted.   Cardiovascular: Normal heart rate noted  Respiratory: Normal respiratory effort, no problems with respiration noted  Abdomen: Soft, gravid, appropriate for gestational age.  Pain/Pressure: Present     Pelvic: Cervical exam deferred        Extremities: Normal range of motion.     Mental Status: Normal mood and affect. Normal behavior. Normal judgment and thought content.   Assessment and Plan:  Pregnancy: [id number] at [redacted]w[redacted]d 1. Supervision of high risk pregnancy, antepartum Patient is doing well without complaints  2. Diabetes mellitus affecting pregnancy in second trimester Patient did not bring  CBG log but reports fasting in the low 100's and all pp within range (highest value of 110) Will increase bedtime metformin to 1000 mg Patient knows the importance of a protein rich snack at bedtime  3. Chronic hypertension affecting pregnancy Elevated BP Will increase labetalol 400 mg BID Continue ASA Follow up growth and dopplers  4. Maternal obesity affecting pregnancy, antepartum   5. Previous cesarean delivery affecting pregnancy Will be scheduled for repeat   Preterm labor symptoms and general obstetric precautions including but not limited to vaginal bleeding, contractions, leaking of fluid and fetal movement were reviewed in detail with the patient. Please refer to After Visit Summary for other counseling recommendations.   Return in about 3 weeks (around 02/11/2021) for in person, ROB, High risk.  Future Appointments  Date Time Provider [place]  01/26/2021  9:30 AM [facility] NURSE [facility]  01/26/2021  9:45 AM [facility] US5 [facility] [place]    Mora Bellman, MD

## 2021-01-21 NOTE — Progress Notes (Signed)
Pt states that fasting sugars have been elevated.  Pt has noticed some differences with and without a night snack.

## 2021-01-26 ENCOUNTER — Other Ambulatory Visit: Payer: Self-pay | Admitting: *Deleted

## 2021-01-26 ENCOUNTER — Ambulatory Visit: Payer: Medicaid Other

## 2021-01-26 ENCOUNTER — Encounter: Payer: Self-pay | Admitting: *Deleted

## 2021-01-26 ENCOUNTER — Ambulatory Visit: Payer: Medicaid Other | Attending: Obstetrics and Gynecology

## 2021-01-26 ENCOUNTER — Other Ambulatory Visit: Payer: Self-pay

## 2021-01-26 ENCOUNTER — Ambulatory Visit: Payer: Medicaid Other | Admitting: *Deleted

## 2021-01-26 VITALS — BP 134/79 | HR 91

## 2021-01-26 DIAGNOSIS — R768 Other specified abnormal immunological findings in serum: Secondary | ICD-10-CM

## 2021-01-26 DIAGNOSIS — O34219 Maternal care for unspecified type scar from previous cesarean delivery: Secondary | ICD-10-CM | POA: Diagnosis present

## 2021-01-26 DIAGNOSIS — O99119 Other diseases of the blood and blood-forming organs and certain disorders involving the immune mechanism complicating pregnancy, unspecified trimester: Secondary | ICD-10-CM

## 2021-01-26 DIAGNOSIS — O24419 Gestational diabetes mellitus in pregnancy, unspecified control: Secondary | ICD-10-CM

## 2021-01-26 DIAGNOSIS — O10912 Unspecified pre-existing hypertension complicating pregnancy, second trimester: Secondary | ICD-10-CM | POA: Insufficient documentation

## 2021-01-26 DIAGNOSIS — O280 Abnormal hematological finding on antenatal screening of mother: Secondary | ICD-10-CM

## 2021-01-26 DIAGNOSIS — Z98891 History of uterine scar from previous surgery: Secondary | ICD-10-CM

## 2021-01-26 DIAGNOSIS — O99112 Other diseases of the blood and blood-forming organs and certain disorders involving the immune mechanism complicating pregnancy, second trimester: Secondary | ICD-10-CM | POA: Diagnosis not present

## 2021-01-26 DIAGNOSIS — O0990 Supervision of high risk pregnancy, unspecified, unspecified trimester: Secondary | ICD-10-CM | POA: Diagnosis present

## 2021-01-26 DIAGNOSIS — D6861 Antiphospholipid syndrome: Secondary | ICD-10-CM

## 2021-01-26 DIAGNOSIS — O99210 Obesity complicating pregnancy, unspecified trimester: Secondary | ICD-10-CM

## 2021-01-26 DIAGNOSIS — O09892 Supervision of other high risk pregnancies, second trimester: Secondary | ICD-10-CM

## 2021-01-26 DIAGNOSIS — Z6841 Body Mass Index (BMI) 40.0 and over, adult: Secondary | ICD-10-CM | POA: Diagnosis present

## 2021-01-26 DIAGNOSIS — O09899 Supervision of other high risk pregnancies, unspecified trimester: Secondary | ICD-10-CM | POA: Diagnosis present

## 2021-01-26 DIAGNOSIS — O10012 Pre-existing essential hypertension complicating pregnancy, second trimester: Secondary | ICD-10-CM

## 2021-01-26 DIAGNOSIS — Z3A23 23 weeks gestation of pregnancy: Secondary | ICD-10-CM

## 2021-01-26 DIAGNOSIS — O99212 Obesity complicating pregnancy, second trimester: Secondary | ICD-10-CM

## 2021-01-28 LAB — AB SCR+ANTIBODY ID: Antibody Screen: POSITIVE — AB

## 2021-01-28 LAB — ANTIBODY SCREEN

## 2021-02-08 ENCOUNTER — Encounter: Payer: Medicaid Other | Admitting: Obstetrics and Gynecology

## 2021-02-16 ENCOUNTER — Other Ambulatory Visit: Payer: Self-pay

## 2021-02-16 ENCOUNTER — Ambulatory Visit (INDEPENDENT_AMBULATORY_CARE_PROVIDER_SITE_OTHER): Payer: Medicaid Other | Admitting: Obstetrics & Gynecology

## 2021-02-16 VITALS — BP 144/84 | HR 92 | Wt 285.6 lb

## 2021-02-16 DIAGNOSIS — Z23 Encounter for immunization: Secondary | ICD-10-CM | POA: Diagnosis not present

## 2021-02-16 DIAGNOSIS — O34219 Maternal care for unspecified type scar from previous cesarean delivery: Secondary | ICD-10-CM

## 2021-02-16 DIAGNOSIS — O0990 Supervision of high risk pregnancy, unspecified, unspecified trimester: Secondary | ICD-10-CM | POA: Diagnosis not present

## 2021-02-16 DIAGNOSIS — O24912 Unspecified diabetes mellitus in pregnancy, second trimester: Secondary | ICD-10-CM

## 2021-02-16 DIAGNOSIS — O10919 Unspecified pre-existing hypertension complicating pregnancy, unspecified trimester: Secondary | ICD-10-CM

## 2021-02-16 MED ORDER — NIFEDIPINE ER 60 MG PO TB24: 60.0000 mg | ORAL_TABLET | Freq: Every day | ORAL | 3 refills | Status: DC

## 2021-02-16 NOTE — Progress Notes (Signed)
° °  PRENATAL VISIT NOTE  Subjective:  Marilyn Cooper is a 31 y.o. [id number] at [redacted]w[redacted]d being seen today for ongoing prenatal care.  She is currently monitored for the following issues for this high-risk pregnancy and has Generalized anxiety disorder; Incarcerated umbilical hernia; Panic disorder; Depression; Chronic hypertension affecting pregnancy; Depressed mood; Supervision of high risk pregnancy, antepartum; Major depressive disorder with single episode, in partial remission ([place]); Maternal obesity affecting pregnancy, antepartum; Previous cesarean delivery affecting pregnancy; and Diabetes mellitus affecting pregnancy in second trimester on their problem list.  Patient reports no complaints.  Contractions: Not present. Vag. Bleeding: None.  Movement: Present. Denies leaking of fluid.   The following portions of the patient's history were reviewed and updated as appropriate: allergies, current medications, past family history, past medical history, past social history, past surgical history and problem list.   Objective:   Vitals:   02/16/21 1358  BP: (!) 144/84  Pulse: 92  Weight: 285 lb 9.6 oz (129.5 kg)    Fetal Status: Fetal Heart Rate (bpm): 151   Movement: Present     General:  Alert, oriented and cooperative. Patient is in no acute distress.  Skin: Skin is warm and dry. No rash noted.   Cardiovascular: Normal heart rate noted  Respiratory: Normal respiratory effort, no problems with respiration noted  Abdomen: Soft, gravid, appropriate for gestational age.  Pain/Pressure: Present     Pelvic: Cervical exam deferred        Extremities: Normal range of motion.  Edema: Trace  Mental Status: Normal mood and affect. Normal behavior. Normal judgment and thought content.   Assessment and Plan:  Pregnancy: [id number] at [redacted]w[redacted]d 1. Supervision of high risk pregnancy, antepartum 28 week labs - HIV Antibody (routine testing w rflx) - RPR - Tdap vaccine greater than or equal to 7yo IM -  NIFEdipine (ADALAT CC) 60 MG 24 hr tablet; Take 1 tablet (60 mg total) by mouth daily.  Dispense: 30 tablet; Refill: 3  2. Diabetes mellitus affecting pregnancy in second trimester Continue metformin  3. Chronic hypertension affecting pregnancy Increase Procardia  4. Previous cesarean delivery affecting pregnancy   Preterm labor symptoms and general obstetric precautions including but not limited to vaginal bleeding, contractions, leaking of fluid and fetal movement were reviewed in detail with the patient. Please refer to After Visit Summary for other counseling recommendations.   Return in about 2 weeks (around 03/02/2021).  Future Appointments  Date Time Provider [place]  02/23/2021  9:45 AM [facility] NURSE [facility]  02/23/2021 10:00 AM [facility] US1 [facility]  03/30/2021  8:30 AM [facility] NURSE [facility]  03/30/2021  8:45 AM [facility] US4 [facility] [place]    Emeterio Reeve, MD

## 2021-02-17 LAB — HIV ANTIBODY (ROUTINE TESTING W REFLEX): HIV Screen 4th Generation wRfx: NONREACTIVE

## 2021-02-17 LAB — RPR: RPR Ser Ql: NONREACTIVE

## 2021-02-23 ENCOUNTER — Other Ambulatory Visit: Payer: Self-pay | Admitting: *Deleted

## 2021-02-23 ENCOUNTER — Ambulatory Visit: Payer: Medicaid Other | Admitting: *Deleted

## 2021-02-23 ENCOUNTER — Encounter: Payer: Self-pay | Admitting: *Deleted

## 2021-02-23 ENCOUNTER — Other Ambulatory Visit: Payer: Self-pay

## 2021-02-23 ENCOUNTER — Other Ambulatory Visit: Payer: Self-pay | Admitting: Maternal & Fetal Medicine

## 2021-02-23 ENCOUNTER — Ambulatory Visit: Payer: Medicaid Other | Attending: Maternal & Fetal Medicine

## 2021-02-23 VITALS — BP 131/65 | HR 84

## 2021-02-23 DIAGNOSIS — O10912 Unspecified pre-existing hypertension complicating pregnancy, second trimester: Secondary | ICD-10-CM

## 2021-02-23 DIAGNOSIS — O34219 Maternal care for unspecified type scar from previous cesarean delivery: Secondary | ICD-10-CM

## 2021-02-23 DIAGNOSIS — O24419 Gestational diabetes mellitus in pregnancy, unspecified control: Secondary | ICD-10-CM | POA: Insufficient documentation

## 2021-02-23 DIAGNOSIS — R768 Other specified abnormal immunological findings in serum: Secondary | ICD-10-CM | POA: Insufficient documentation

## 2021-02-23 DIAGNOSIS — O99210 Obesity complicating pregnancy, unspecified trimester: Secondary | ICD-10-CM | POA: Diagnosis present

## 2021-02-23 DIAGNOSIS — E668 Other obesity: Secondary | ICD-10-CM | POA: Diagnosis not present

## 2021-02-23 DIAGNOSIS — O09899 Supervision of other high risk pregnancies, unspecified trimester: Secondary | ICD-10-CM

## 2021-02-23 DIAGNOSIS — O0990 Supervision of high risk pregnancy, unspecified, unspecified trimester: Secondary | ICD-10-CM | POA: Insufficient documentation

## 2021-02-23 DIAGNOSIS — O99212 Obesity complicating pregnancy, second trimester: Secondary | ICD-10-CM

## 2021-02-23 DIAGNOSIS — O10012 Pre-existing essential hypertension complicating pregnancy, second trimester: Secondary | ICD-10-CM | POA: Diagnosis not present

## 2021-02-23 DIAGNOSIS — O09299 Supervision of pregnancy with other poor reproductive or obstetric history, unspecified trimester: Secondary | ICD-10-CM

## 2021-02-23 DIAGNOSIS — Z3A27 27 weeks gestation of pregnancy: Secondary | ICD-10-CM

## 2021-03-02 ENCOUNTER — Telehealth (INDEPENDENT_AMBULATORY_CARE_PROVIDER_SITE_OTHER): Payer: Medicaid Other | Admitting: Obstetrics and Gynecology

## 2021-03-02 ENCOUNTER — Encounter: Payer: Self-pay | Admitting: Obstetrics and Gynecology

## 2021-03-02 VITALS — BP 132/77 | HR 90

## 2021-03-02 DIAGNOSIS — O10919 Unspecified pre-existing hypertension complicating pregnancy, unspecified trimester: Secondary | ICD-10-CM

## 2021-03-02 DIAGNOSIS — O0992 Supervision of high risk pregnancy, unspecified, second trimester: Secondary | ICD-10-CM

## 2021-03-02 DIAGNOSIS — O34219 Maternal care for unspecified type scar from previous cesarean delivery: Secondary | ICD-10-CM

## 2021-03-02 DIAGNOSIS — O99212 Obesity complicating pregnancy, second trimester: Secondary | ICD-10-CM

## 2021-03-02 DIAGNOSIS — O99210 Obesity complicating pregnancy, unspecified trimester: Secondary | ICD-10-CM

## 2021-03-02 DIAGNOSIS — O0990 Supervision of high risk pregnancy, unspecified, unspecified trimester: Secondary | ICD-10-CM

## 2021-03-02 DIAGNOSIS — E119 Type 2 diabetes mellitus without complications: Secondary | ICD-10-CM

## 2021-03-02 DIAGNOSIS — O24912 Unspecified diabetes mellitus in pregnancy, second trimester: Secondary | ICD-10-CM

## 2021-03-02 DIAGNOSIS — Z3A28 28 weeks gestation of pregnancy: Secondary | ICD-10-CM

## 2021-03-02 DIAGNOSIS — O10912 Unspecified pre-existing hypertension complicating pregnancy, second trimester: Secondary | ICD-10-CM

## 2021-03-02 NOTE — Progress Notes (Signed)
OBSTETRICS PRENATAL VIRTUAL VISIT ENCOUNTER NOTE  Provider location: [facility] [place] [facility]   Patient location: Home  I connected with Marilyn Cooper on 03/02/21 at  1:30 PM EST by MyChart Video Encounter and verified that I am speaking with the correct person using two identifiers. I discussed the limitations, risks, security and privacy concerns of performing an evaluation and management service virtually and the availability of in person appointments. I also discussed with the patient that there may be a patient responsible charge related to this service. The patient expressed understanding and agreed to proceed. Subjective:  Marilyn Cooper is a 31 y.o. [id number] at [redacted]w[redacted]d being seen today for ongoing prenatal care.  She is currently monitored for the following issues for this high-risk pregnancy and has Generalized anxiety disorder; Incarcerated umbilical hernia; Panic disorder; Depression; Chronic hypertension affecting pregnancy; Depressed mood; Supervision of high risk pregnancy, antepartum; Major depressive disorder with single episode, in partial remission ([place]); Maternal obesity affecting pregnancy, antepartum; Previous cesarean delivery affecting pregnancy; and Diabetes mellitus affecting pregnancy in second trimester on their problem list.  Patient reports no complaints.  Contractions: Not present. Vag. Bleeding: None.  Movement: Present. Denies any leaking of fluid.   The following portions of the patient's history were reviewed and updated as appropriate: allergies, current medications, past family history, past medical history, past social history, past surgical history and problem list.   Objective:   Vitals:   03/02/21 1315  BP: 132/77  Pulse: 90    Fetal Status:     Movement: Present     General:  Alert, oriented and cooperative. Patient is in no acute distress.  Respiratory: Normal respiratory effort, no problems with respiration noted  Mental  Status: Normal mood and affect. Normal behavior. Normal judgment and thought content.  Rest of physical exam deferred due to type of encounter  Imaging: [place] MFM MCA DOPPLER  Result Date: 02/24/2021 ----------------------------------------------------------------------  OBSTETRICS REPORT                    (Corrected Final 02/24/2021 10:13 am) ---------------------------------------------------------------------- Patient Info  ID #:       [id number]                          D.O.B.:  April 14, 1990 (31 yrs)  Name:       Marilyn Cooper                Visit Date: 02/23/2021 10:03 am ---------------------------------------------------------------------- Performed By  Attending:        Johnell Comings MD         Ref. Address:     [place], [place]  [address]  Performed By:     Jacob Moores BS,       [facility]                                [facility] at                                                             [place] for                                                             Women  Referred By:      Woodroe Mode                    MD ---------------------------------------------------------------------- Orders  #  Description                           Code        Ordered By  1  [place] MFM OB FOLLOW UP                   [id number]    Sander Nephew  2  [place] MFM MCA DOPPLER                    [id number]    Sander Nephew ----------------------------------------------------------------------  #  Order #                     Accession #                Episode #  1  [id number]                   [id number]                 [id number]  2  [id number]                   [id number]                  [id number] ---------------------------------------------------------------------- Indications  Hypertension - Chronic/Pre-existing            O10.019  (Labetalol,Procardia)  2  vessel umbilical cord                        O69.89X0  [redacted] weeks gestation of pregnancy                Z3A.27  Gestational diabetes in pregnancy,             O24.419  unspecified control  Obesity complicating pregnancy, second         O99.212  trimester (BMI 54)  History of cesarean delivery, currently        O34.219  pregnant x 3  Poor obstetric history: Previous preterm       O09.219  delivery, antepartum (34,36 weeks)  Asthma                                         O99.89 j45.909  Anemia during pregnancy in second trimester    O99.012  Poor obstetric history: Previous               O09.299  preeclampsia / eclampsia/gestational HTN  Medical complication of pregnancy (Anit-       O26.90  Duffy Antibody)  Encounter for other antenatal screening        Z36.2  follow-up  Isoimmunization - Other                        O36.1910 ---------------------------------------------------------------------- Fetal Evaluation  Num Of Fetuses:         1  Fetal Heart Rate(bpm):  133  Cardiac Activity:       Observed  Presentation:           Transverse, head to maternal right  Placenta:               Anterior  P. Cord Insertion:      Previously Visualized  Amniotic Fluid  AFI FV:      Within normal limits                              Largest Pocket(cm)                              5.1 ---------------------------------------------------------------------- Biometry  BPD:      69.1  mm     G. Age:  27w 5d         66  %    CI:        74.45   %    70 - 86                                                          FL/HC:      20.1   %    18.6 - 20.4  HC:      254.2  mm     G. Age:  27w 4d         42  %    HC/AC:      1.06        1.05 - 1.21  AC:      239.4  mm     G. Age:  28w 2d         78  %    FL/BPD:     74.0   %    71 - 87  FL:       51.1  mm     G.  Age:  27w 3d         55  %    FL/AC:      21.3   %    20 - 24  LV:        6.3  mm  Est. FW:    1135  gm      2 lb 8 oz     72  % ---------------------------------------------------------------------- OB History  Gravidity:    8         Term:   1        Prem:   2        SAB:   0  TOP:          0       Ectopic:  0        Living: 3 ---------------------------------------------------------------------- Gestational Age  LMP:           28w 1d        Date:  08/10/20                 EDD:   05/17/21  U/S Today:     27w 5d                                        EDD:   05/20/21  Best:          27w 0d     Det. ByLoman Chroman         EDD:   05/25/21                                      (10/21/20) ---------------------------------------------------------------------- Anatomy  Cranium:               Appears normal         LVOT:                   Not well visualized  Cavum:                 Appears normal         Aortic Arch:            Not well visualized  Ventricles:            Appears normal         Ductal Arch:            Previously seen  Choroid Plexus:        Previously seen        Diaphragm:              Appears normal  Cerebellum:            Previously seen        Stomach:                Appears normal, left  sided  Posterior Fossa:       Previously seen        Abdomen:                Previously seen  Nuchal Fold:           Previously seen        Abdominal Wall:         Previously seen  Face:                  Orbits and profile     Cord Vessels:           2 vessel cord,                         previously seen                                                                        absent left umb art  Lips:                  Not well visualized    Kidneys:                Appear normal  Palate:                Not well visualized    Bladder:                Appears normal  Thoracic:              Appears normal         Spine:                  Limited views                                                                         appear normal  Heart:                 Previously seen        Upper Extremities:      Visualized prev.  RVOT:                  Previously seen        Lower Extremities:      Visualized prev.  Other:  Female gender previously seen. Technically difficult due to maternal          habitus and fetal position. ---------------------------------------------------------------------- Doppler - Fetal Vessels  Middle Cerebral Artery   S/D                                 PI    %tile     PSV   MoM                                                     (  cm/s)   4.15                               1.5      3.6    41.72  1.18 ---------------------------------------------------------------------- Cervix Uterus Adnexa  Cervix  Length:           3.06  cm.  Normal appearance by transabdominal scan.  Uterus  No abnormality visualized.  Right Ovary  Within normal limits.  Left Ovary  Within normal limits.  Cul De Sac  No free fluid seen.  Adnexa  No abnormality visualized. ---------------------------------------------------------------------- Comments  This patient was seen for a follow up growth scan due to  maternal obesity with a BMI of 54, chronic hypertension  treated with labetalol and Procardia, gestational diabetes  treated with metformin, and a fetus with a two-vessel  umbilical cord.  Her most recent anti-Duffy antibody titer was  too low to titer.  She denies any problems since her last exam  and reports that her fingerstick values have mostly been  within normal limits.  Her blood pressure today was 131/65.  She was informed that the fetal growth and amniotic fluid  level appears appropriate for her gestational age.  The peak systolic velocity of the middle cerebral artery was  less than 1.5 multiple of the median for her gestational age,  indicating that her fetus is not anemic at this time.  Due to her underlying medical conditions, we will start weekly  fetal  testing in 4 weeks.  Due to her multiple medical comorbidities, delivery should  probably occur at between 37 to 38 weeks.  A BPP and growth scan was scheduled in 4 weeks.  Addendum: 02/24/21  The patient's partner Harrietta Guardian has screened positive  for the Duffy antigens.  He is positive for both the Fya and  Fyb antigens.  The patient was advised that she was most likely exposed to  the Duffy antigen during one of her prior pregnancies.  Her  most recent anti-Duffy antibody titer level was low and the  MCA Doppler studies yesterday did not indicate that her fetus  is anemic at this time.  We will continue to follow her with MCA Doppler studies  during her future ultrasound exams. ----------------------------------------------------------------------                        Johnell Comings, MD Electronically Signed Corrected Final Report  02/24/2021 10:13 am ----------------------------------------------------------------------  [place] MFM OB FOLLOW UP  Result Date: 02/24/2021 ----------------------------------------------------------------------  OBSTETRICS REPORT                    (Corrected Final 02/24/2021 10:13 am) ---------------------------------------------------------------------- Patient Info  ID #:       [id number]                          D.O.B.:  Jul 08, 1990 (31 yrs)  Name:       Marilyn Cooper                Visit Date: 02/23/2021 10:03 am ---------------------------------------------------------------------- Performed By  Attending:        Johnell Comings MD         Ref. Address:     [address]  [place], [place]  Performed By:     Jacob Moores BS,       [facility]T                                Fetal Care at                                                              [place] for                                                             Women  Referred By:      Woodroe Mode                    MD ---------------------------------------------------------------------- Orders  #  Description                           Code        Ordered By  1  [place] MFM OB FOLLOW UP                   76816.01    Sander Nephew  2  [place] MFM MCA DOPPLER                    [id number]    Sander Nephew ----------------------------------------------------------------------  #  Order #  Accession #                Episode #  1  [id number]                   [id number]                 [id number]  2  [id number]                   [id number]                 [id number] ---------------------------------------------------------------------- Indications  Hypertension - Chronic/Pre-existing            O10.019  (Labetalol,Procardia)  2 vessel umbilical cord                        O69.89X0  [redacted] weeks gestation of pregnancy                Z3A.27  Gestational diabetes in pregnancy,             O24.419  unspecified control  Obesity complicating pregnancy, second         O99.212  trimester (BMI 54)  History of cesarean delivery, currently        O34.219  pregnant x 3  Poor obstetric history: Previous preterm       O09.219  delivery, antepartum (34,36 weeks)  Asthma                                         O99.89 j45.909  Anemia during pregnancy in second trimester    O99.012  Poor obstetric history: Previous               O09.299  preeclampsia / eclampsia/gestational HTN  Medical complication of pregnancy (Anit-       O26.90  Duffy Antibody)  Encounter for other antenatal screening        Z36.2  follow-up  Isoimmunization - Other                        O36.1910 ---------------------------------------------------------------------- Fetal Evaluation  Num Of Fetuses:         1  Fetal Heart Rate(bpm):   133  Cardiac Activity:       Observed  Presentation:           Transverse, head to maternal right  Placenta:               Anterior  P. Cord Insertion:      Previously Visualized  Amniotic Fluid  AFI FV:      Within normal limits                              Largest Pocket(cm)                              5.1 ---------------------------------------------------------------------- Biometry  BPD:      69.1  mm     G. Age:  27w 5d         66  %    CI:        74.45   %    70 - 86  FL/HC:      20.1   %    18.6 - 20.4  HC:      254.2  mm     G. Age:  27w 4d         42  %    HC/AC:      1.06        1.05 - 1.21  AC:      239.4  mm     G. Age:  28w 2d         78  %    FL/BPD:     74.0   %    71 - 87  FL:       51.1  mm     G. Age:  27w 3d         28  %    FL/AC:      21.3   %    20 - 24  LV:        6.3  mm  Est. FW:    1135  gm      2 lb 8 oz     72  % ---------------------------------------------------------------------- OB History  Gravidity:    8         Term:   1        Prem:   2        SAB:   0  TOP:          0       Ectopic:  0        Living: 3 ---------------------------------------------------------------------- Gestational Age  LMP:           28w 1d        Date:  08/10/20                 EDD:   05/17/21  U/S Today:     27w 5d                                        EDD:   05/20/21  Best:          27w 0d     Det. By:  Loman Chroman         EDD:   05/25/21                                      (10/21/20) ---------------------------------------------------------------------- Anatomy  Cranium:               Appears normal         LVOT:                   Not well visualized  Cavum:                 Appears normal         Aortic Arch:            Not well visualized  Ventricles:            Appears normal         Ductal Arch:            Previously seen  Choroid Plexus:        Previously seen        Diaphragm:  Appears normal  Cerebellum:             Previously seen        Stomach:                Appears normal, left                                                                        sided  Posterior Fossa:       Previously seen        Abdomen:                Previously seen  Nuchal Fold:           Previously seen        Abdominal Wall:         Previously seen  Face:                  Orbits and profile     Cord Vessels:           2 vessel cord,                         previously seen                                                                        absent left umb art  Lips:                  Not well visualized    Kidneys:                Appear normal  Palate:                Not well visualized    Bladder:                Appears normal  Thoracic:              Appears normal         Spine:                  Limited views                                                                        appear normal  Heart:                 Previously seen        Upper Extremities:      Visualized prev.  RVOT:                  Previously seen        Lower Extremities:  Visualized prev.  Other:  Female gender previously seen. Technically difficult due to maternal          habitus and fetal position. ---------------------------------------------------------------------- Doppler - Fetal Vessels  Middle Cerebral Artery   S/D                                 PI    %tile     PSV   MoM                                                     (cm/s)   4.15                               1.5      3.6    41.72  1.18 ---------------------------------------------------------------------- Cervix Uterus Adnexa  Cervix  Length:           3.06  cm.  Normal appearance by transabdominal scan.  Uterus  No abnormality visualized.  Right Ovary  Within normal limits.  Left Ovary  Within normal limits.  Cul De Sac  No free fluid seen.  Adnexa  No abnormality visualized. ---------------------------------------------------------------------- Comments  This patient was seen for a follow up growth  scan due to  maternal obesity with a BMI of 54, chronic hypertension  treated with labetalol and Procardia, gestational diabetes  treated with metformin, and a fetus with a two-vessel  umbilical cord.  Her most recent anti-Duffy antibody titer was  too low to titer.  She denies any problems since her last exam  and reports that her fingerstick values have mostly been  within normal limits.  Her blood pressure today was 131/65.  She was informed that the fetal growth and amniotic fluid  level appears appropriate for her gestational age.  The peak systolic velocity of the middle cerebral artery was  less than 1.5 multiple of the median for her gestational age,  indicating that her fetus is not anemic at this time.  Due to her underlying medical conditions, we will start weekly  fetal testing in 4 weeks.  Due to her multiple medical comorbidities, delivery should  probably occur at between 37 to 38 weeks.  A BPP and growth scan was scheduled in 4 weeks.  Addendum: 02/24/21  The patient's partner Harrietta Guardian has screened positive  for the Duffy antigens.  He is positive for both the Fya and  Fyb antigens.  The patient was advised that she was most likely exposed to  the Duffy antigen during one of her prior pregnancies.  Her  most recent anti-Duffy antibody titer level was low and the  MCA Doppler studies yesterday did not indicate that her fetus  is anemic at this time.  We will continue to follow her with MCA Doppler studies  during her future ultrasound exams. ----------------------------------------------------------------------                        Johnell Comings, MD Electronically Signed Corrected Final Report  02/24/2021 10:13 am ----------------------------------------------------------------------   Assessment and Plan:  Pregnancy: [id number] at [redacted]w[redacted]d 1. Supervision of high risk pregnancy, antepartum Patient is doing well  Husband is planning a vasectomy  2. Diabetes mellitus  affecting pregnancy in second  trimester Patient reports fasting as high as 105 with most values in 95; and highest pp 125 Will continue metformin 1000 mg qHS Advised to consume a protein rich snack at bedtime Antenatal testing per MFM schedule  3. Chronic hypertension affecting pregnancy Stable Continue labetalol, procardia and ASA  4. Maternal obesity affecting pregnancy, antepartum   5. Previous cesarean delivery affecting pregnancy Will be scheduled for repeat c-section at 37-38 weeks and opted to for delivery on 05/06/21 without BTL Request sent to surgical scheduler  Preterm labor symptoms and general obstetric precautions including but not limited to vaginal bleeding, contractions, leaking of fluid and fetal movement were reviewed in detail with the patient. I discussed the assessment and treatment plan with the patient. The patient was provided an opportunity to ask questions and all were answered. The patient agreed with the plan and demonstrated an understanding of the instructions. The patient was advised to call back or seek an in-person office evaluation/go to [facility] for any urgent or concerning symptoms. Please refer to After Visit Summary for other counseling recommendations.   I provided 15 minutes of face-to-face time during this encounter.  Return in about 2 weeks (around 03/16/2021) for in person, ROB, High risk.  Future Appointments  Date Time Provider [place]  03/02/2021  1:30 PM Pierce Barocio, Vickii Chafe, MD [place] None  03/16/2021  1:30 PM Woodroe Mode, MD [place] None  03/23/2021  8:45 AM [facility] NURSE [facility]  03/23/2021  9:00 AM [facility] US1 [facility]  03/30/2021  8:30 AM [facility] NURSE [facility]  03/30/2021  8:45 AM [facility] US4 [facility]  03/30/2021  1:30 PM Woodroe Mode, MD [facility] None  04/13/2021  1:30 PM Woodroe Mode, MD [place] None  04/26/2021  8:35 AM Chancy Milroy, MD [place] None  05/04/2021  1:30 PM Woodroe Mode, MD [place] None   05/12/2021  1:30 PM Shelly Bombard, MD [place] None    Mora Bellman, MD [facility] for [facility], [place]

## 2021-03-02 NOTE — Progress Notes (Signed)
Pt states she has discuss delivery timing with MFM, ? Delivery around 37-38 weeks.  Pt states her sugars are within range the majority of the time.

## 2021-03-16 ENCOUNTER — Ambulatory Visit (INDEPENDENT_AMBULATORY_CARE_PROVIDER_SITE_OTHER): Payer: Medicaid Other | Admitting: Obstetrics & Gynecology

## 2021-03-16 ENCOUNTER — Other Ambulatory Visit: Payer: Self-pay

## 2021-03-16 VITALS — BP 150/91 | HR 97 | Wt 281.0 lb

## 2021-03-16 DIAGNOSIS — O24912 Unspecified diabetes mellitus in pregnancy, second trimester: Secondary | ICD-10-CM

## 2021-03-16 DIAGNOSIS — O0990 Supervision of high risk pregnancy, unspecified, unspecified trimester: Secondary | ICD-10-CM

## 2021-03-16 DIAGNOSIS — O34219 Maternal care for unspecified type scar from previous cesarean delivery: Secondary | ICD-10-CM

## 2021-03-16 DIAGNOSIS — O99210 Obesity complicating pregnancy, unspecified trimester: Secondary | ICD-10-CM

## 2021-03-16 DIAGNOSIS — O10919 Unspecified pre-existing hypertension complicating pregnancy, unspecified trimester: Secondary | ICD-10-CM

## 2021-03-16 NOTE — Progress Notes (Signed)
Pt states she is having facial redness when taking Procardia 60mg , lasting a few hours.

## 2021-03-16 NOTE — Progress Notes (Signed)
° °  PRENATAL VISIT NOTE  Subjective:  Marilyn Cooper is a 31 y.o. [id number] at [redacted]w[redacted]d being seen today for ongoing prenatal care.  She is currently monitored for the following issues for this high-risk pregnancy and has Generalized anxiety disorder; Incarcerated umbilical hernia; Panic disorder; Depression; Chronic hypertension affecting pregnancy; Depressed mood; Supervision of high risk pregnancy, antepartum; Major depressive disorder with single episode, in partial remission ([place]); Maternal obesity affecting pregnancy, antepartum; Previous cesarean delivery affecting pregnancy; and Diabetes mellitus affecting pregnancy in second trimester on their problem list.  Patient reports  facial flushing for a few hours after taking procardia .  Contractions: Not present. Vag. Bleeding: None.  Movement: Present. Denies leaking of fluid.   The following portions of the patient's history were reviewed and updated as appropriate: allergies, current medications, past family history, past medical history, past social history, past surgical history and problem list.   Objective:   Vitals:   03/16/21 1339  BP: (!) 150/91  Pulse: 97  Weight: 281 lb (127.5 kg)    Fetal Status:     Movement: Present     General:  Alert, oriented and cooperative. Patient is in no acute distress.  Skin: Skin is warm and dry. No rash noted.   Cardiovascular: Normal heart rate noted  Respiratory: Normal respiratory effort, no problems with respiration noted  Abdomen: Soft, gravid, appropriate for gestational age.  Pain/Pressure: Absent     Pelvic: Cervical exam deferred        Extremities: Normal range of motion.     Mental Status: Normal mood and affect. Normal behavior. Normal judgment and thought content.   Assessment and Plan:  Pregnancy: [id number] at [redacted]w[redacted]d 1. Supervision of high risk pregnancy, antepartum Has MFM f/u  2. Chronic hypertension affecting pregnancy Try Procardia 30 mg 3. Previous cesarean delivery  affecting pregnancy Repeat at 37 weeks  4. Maternal obesity affecting pregnancy, antepartum Body mass index is 53.09 kg/m.   5. Diabetes mellitus affecting pregnancy in second trimester FBS 90's and PP <110  Preterm labor symptoms and general obstetric precautions including but not limited to vaginal bleeding, contractions, leaking of fluid and fetal movement were reviewed in detail with the patient. Please refer to After Visit Summary for other counseling recommendations.   Return in about 2 weeks (around 03/30/2021).  Future Appointments  Date Time Provider [place]  03/23/2021  8:45 AM [facility] NURSE [facility]  03/23/2021  9:00 AM [facility] US1 [facility]  03/30/2021  8:30 AM [facility] NURSE [facility]  03/30/2021  8:45 AM [facility] US4 [facility]  03/30/2021  1:30 PM Woodroe Mode, MD [facility] None  04/13/2021  1:30 PM Woodroe Mode, MD [place] None  04/26/2021  8:35 AM Chancy Milroy, MD [place] None  05/04/2021  1:30 PM Woodroe Mode, MD [place] None  05/12/2021  1:30 PM Shelly Bombard, MD [facility] None    Emeterio Reeve, MD

## 2021-03-23 ENCOUNTER — Other Ambulatory Visit: Payer: Self-pay | Admitting: *Deleted

## 2021-03-23 ENCOUNTER — Other Ambulatory Visit: Payer: Self-pay

## 2021-03-23 ENCOUNTER — Ambulatory Visit: Payer: Medicaid Other | Attending: Obstetrics

## 2021-03-23 ENCOUNTER — Ambulatory Visit: Payer: Medicaid Other | Admitting: *Deleted

## 2021-03-23 VITALS — BP 131/63 | HR 82

## 2021-03-23 DIAGNOSIS — O24419 Gestational diabetes mellitus in pregnancy, unspecified control: Secondary | ICD-10-CM | POA: Diagnosis not present

## 2021-03-23 DIAGNOSIS — O34219 Maternal care for unspecified type scar from previous cesarean delivery: Secondary | ICD-10-CM | POA: Diagnosis present

## 2021-03-23 DIAGNOSIS — O99213 Obesity complicating pregnancy, third trimester: Secondary | ICD-10-CM

## 2021-03-23 DIAGNOSIS — O99210 Obesity complicating pregnancy, unspecified trimester: Secondary | ICD-10-CM | POA: Diagnosis present

## 2021-03-23 DIAGNOSIS — O10912 Unspecified pre-existing hypertension complicating pregnancy, second trimester: Secondary | ICD-10-CM | POA: Diagnosis not present

## 2021-03-23 DIAGNOSIS — O99212 Obesity complicating pregnancy, second trimester: Secondary | ICD-10-CM | POA: Diagnosis present

## 2021-03-23 DIAGNOSIS — O10913 Unspecified pre-existing hypertension complicating pregnancy, third trimester: Secondary | ICD-10-CM

## 2021-03-23 DIAGNOSIS — O10013 Pre-existing essential hypertension complicating pregnancy, third trimester: Secondary | ICD-10-CM

## 2021-03-23 DIAGNOSIS — O0990 Supervision of high risk pregnancy, unspecified, unspecified trimester: Secondary | ICD-10-CM | POA: Insufficient documentation

## 2021-03-23 DIAGNOSIS — Z3A31 31 weeks gestation of pregnancy: Secondary | ICD-10-CM

## 2021-03-23 DIAGNOSIS — R768 Other specified abnormal immunological findings in serum: Secondary | ICD-10-CM | POA: Insufficient documentation

## 2021-03-23 DIAGNOSIS — O09899 Supervision of other high risk pregnancies, unspecified trimester: Secondary | ICD-10-CM

## 2021-03-23 DIAGNOSIS — O09213 Supervision of pregnancy with history of pre-term labor, third trimester: Secondary | ICD-10-CM

## 2021-03-30 ENCOUNTER — Ambulatory Visit (INDEPENDENT_AMBULATORY_CARE_PROVIDER_SITE_OTHER): Payer: Medicaid Other | Admitting: Obstetrics & Gynecology

## 2021-03-30 ENCOUNTER — Encounter: Payer: Self-pay | Admitting: Obstetrics & Gynecology

## 2021-03-30 ENCOUNTER — Other Ambulatory Visit: Payer: Self-pay

## 2021-03-30 ENCOUNTER — Ambulatory Visit: Payer: Medicaid Other | Admitting: *Deleted

## 2021-03-30 ENCOUNTER — Ambulatory Visit: Payer: Medicaid Other | Attending: Maternal & Fetal Medicine

## 2021-03-30 ENCOUNTER — Encounter: Payer: Self-pay | Admitting: *Deleted

## 2021-03-30 VITALS — BP 133/74 | HR 85

## 2021-03-30 VITALS — BP 138/84 | HR 97 | Wt 284.8 lb

## 2021-03-30 DIAGNOSIS — O99213 Obesity complicating pregnancy, third trimester: Secondary | ICD-10-CM

## 2021-03-30 DIAGNOSIS — O34219 Maternal care for unspecified type scar from previous cesarean delivery: Secondary | ICD-10-CM | POA: Insufficient documentation

## 2021-03-30 DIAGNOSIS — O10912 Unspecified pre-existing hypertension complicating pregnancy, second trimester: Secondary | ICD-10-CM | POA: Insufficient documentation

## 2021-03-30 DIAGNOSIS — O0990 Supervision of high risk pregnancy, unspecified, unspecified trimester: Secondary | ICD-10-CM

## 2021-03-30 DIAGNOSIS — O10013 Pre-existing essential hypertension complicating pregnancy, third trimester: Secondary | ICD-10-CM

## 2021-03-30 DIAGNOSIS — O99212 Obesity complicating pregnancy, second trimester: Secondary | ICD-10-CM | POA: Insufficient documentation

## 2021-03-30 DIAGNOSIS — O99210 Obesity complicating pregnancy, unspecified trimester: Secondary | ICD-10-CM | POA: Diagnosis present

## 2021-03-30 DIAGNOSIS — O09293 Supervision of pregnancy with other poor reproductive or obstetric history, third trimester: Secondary | ICD-10-CM

## 2021-03-30 DIAGNOSIS — R768 Other specified abnormal immunological findings in serum: Secondary | ICD-10-CM | POA: Insufficient documentation

## 2021-03-30 DIAGNOSIS — O10919 Unspecified pre-existing hypertension complicating pregnancy, unspecified trimester: Secondary | ICD-10-CM

## 2021-03-30 DIAGNOSIS — Z3A32 32 weeks gestation of pregnancy: Secondary | ICD-10-CM

## 2021-03-30 DIAGNOSIS — O24419 Gestational diabetes mellitus in pregnancy, unspecified control: Secondary | ICD-10-CM | POA: Diagnosis not present

## 2021-03-30 DIAGNOSIS — O09899 Supervision of other high risk pregnancies, unspecified trimester: Secondary | ICD-10-CM | POA: Insufficient documentation

## 2021-03-30 DIAGNOSIS — O09299 Supervision of pregnancy with other poor reproductive or obstetric history, unspecified trimester: Secondary | ICD-10-CM | POA: Diagnosis present

## 2021-03-30 DIAGNOSIS — O24912 Unspecified diabetes mellitus in pregnancy, second trimester: Secondary | ICD-10-CM

## 2021-03-30 NOTE — Progress Notes (Signed)
Pt in office for ROB visit. She does not have any concerns today.  ?

## 2021-03-30 NOTE — Progress Notes (Signed)
? ?  PRENATAL VISIT NOTE ? ?Subjective:  ?Marilyn Cooper is a 31 y.o. [id number] at [redacted]w[redacted]d being seen today for ongoing prenatal care.  She is currently monitored for the following issues for this high-risk pregnancy and has Generalized anxiety disorder; Incarcerated umbilical hernia; Panic disorder; Depression; Chronic hypertension affecting pregnancy; Depressed mood; Supervision of high risk pregnancy, antepartum; Major depressive disorder with single episode, in partial remission ([place]); Maternal obesity affecting pregnancy, antepartum; Previous cesarean delivery affecting pregnancy; and Diabetes mellitus affecting pregnancy in second trimester on their problem list. ? ?Patient reports no complaints.  Contractions: Irritability. Vag. Bleeding: None.  Movement: Present. Denies leaking of fluid.  ? ?The following portions of the patient's history were reviewed and updated as appropriate: allergies, current medications, past family history, past medical history, past social history, past surgical history and problem list.  ? ?Objective:  ? ?Vitals:  ? 03/30/21 1328  ?BP: 138/84  ?Pulse: 97  ?Weight: 284 lb 12.8 oz (129.2 kg)  ? ? ?Fetal Status: Fetal Heart Rate (bpm): 142   Movement: Present    ? ?General:  Alert, oriented and cooperative. Patient is in no acute distress.  ?Skin: Skin is warm and dry. No rash noted.   ?Cardiovascular: Normal heart rate noted  ?Respiratory: Normal respiratory effort, no problems with respiration noted  ?Abdomen: Soft, gravid, appropriate for gestational age.  Pain/Pressure: Absent     ?Pelvic: Cervical exam deferred        ?Extremities: Normal range of motion.  Edema: None  ?Mental Status: Normal mood and affect. Normal behavior. Normal judgment and thought content.  ? ?Assessment and Plan:  ?Pregnancy: [id number] at [redacted]w[redacted]d ?1. Maternal obesity affecting pregnancy, antepartum ?Body mass index is 53.81 kg/m?. ? ? ?2. Supervision of high risk pregnancy, antepartum ?S/p 8/8 BPP ? ?3. Previous  cesarean delivery affecting pregnancy ?Repeat at 37 weeks ?Maternal obesity affecting pregnancy, antepartum ? ?Supervision of high risk pregnancy, antepartum ? ?Previous cesarean delivery affecting pregnancy ? ?Chronic hypertension affecting pregnancy ? ?Diabetes mellitus affecting pregnancy in second trimester ?BP control is acceptable and BG is FBS < 100 and PP <120 ?Preterm labor symptoms and general obstetric precautions including but not limited to vaginal bleeding, contractions, leaking of fluid and fetal movement were reviewed in detail with the patient. ?Please refer to After Visit Summary for other counseling recommendations.  ? ?Return in about 2 weeks (around 04/13/2021). ? ?Future Appointments  ?Date Time Provider [place]  ?04/05/2021 10:15 AM [facility] NST [facility] [place]  ?04/13/2021  1:30 PM Woodroe Mode, MD [facility] None  ?04/14/2021 11:15 AM [facility] NST [facility]  ?04/20/2021 10:15 AM [facility] NURSE [facility]  ?04/20/2021 10:30 AM [facility] US3 [facility]  ?04/26/2021  8:35 AM Chancy Milroy, MD [facility] None  ? ? ?Emeterio Reeve, MD ? ?

## 2021-04-05 ENCOUNTER — Other Ambulatory Visit: Payer: Self-pay

## 2021-04-05 ENCOUNTER — Ambulatory Visit: Payer: Medicaid Other | Admitting: *Deleted

## 2021-04-05 ENCOUNTER — Ambulatory Visit (INDEPENDENT_AMBULATORY_CARE_PROVIDER_SITE_OTHER): Payer: Medicaid Other

## 2021-04-05 VITALS — BP 133/82 | HR 90

## 2021-04-05 DIAGNOSIS — O24415 Gestational diabetes mellitus in pregnancy, controlled by oral hypoglycemic drugs: Secondary | ICD-10-CM | POA: Diagnosis not present

## 2021-04-05 DIAGNOSIS — O10919 Unspecified pre-existing hypertension complicating pregnancy, unspecified trimester: Secondary | ICD-10-CM

## 2021-04-05 DIAGNOSIS — O99210 Obesity complicating pregnancy, unspecified trimester: Secondary | ICD-10-CM

## 2021-04-05 NOTE — Progress Notes (Signed)

## 2021-04-12 ENCOUNTER — Other Ambulatory Visit: Payer: Medicaid Other

## 2021-04-13 ENCOUNTER — Ambulatory Visit (INDEPENDENT_AMBULATORY_CARE_PROVIDER_SITE_OTHER): Payer: Medicaid Other | Admitting: Obstetrics & Gynecology

## 2021-04-13 ENCOUNTER — Other Ambulatory Visit: Payer: Self-pay

## 2021-04-13 VITALS — BP 147/88 | HR 108 | Wt 282.0 lb

## 2021-04-13 DIAGNOSIS — O34219 Maternal care for unspecified type scar from previous cesarean delivery: Secondary | ICD-10-CM

## 2021-04-13 DIAGNOSIS — O10919 Unspecified pre-existing hypertension complicating pregnancy, unspecified trimester: Secondary | ICD-10-CM

## 2021-04-13 DIAGNOSIS — O24912 Unspecified diabetes mellitus in pregnancy, second trimester: Secondary | ICD-10-CM

## 2021-04-13 DIAGNOSIS — O0990 Supervision of high risk pregnancy, unspecified, unspecified trimester: Secondary | ICD-10-CM

## 2021-04-13 DIAGNOSIS — O99210 Obesity complicating pregnancy, unspecified trimester: Secondary | ICD-10-CM

## 2021-04-13 MED ORDER — LABETALOL HCL 200 MG PO TABS: 600.0000 mg | ORAL_TABLET | Freq: Two times a day (BID) | ORAL | 1 refills | Status: DC

## 2021-04-13 NOTE — Progress Notes (Signed)
Pt states her BP was really elevated at home this morning, pt did have some pressure behind eyes, no HA.  ?This morning BP was 161/89, repeat after medication was 132/94. ?Pt notices increase in abd tightening with activity.  ?  ?

## 2021-04-13 NOTE — Progress Notes (Signed)
? ?  PRENATAL VISIT NOTE ? ?Subjective:  ?Marilyn Cooper is a 31 y.o. [id number] at [redacted]w[redacted]d being seen today for ongoing prenatal care.  She is currently monitored for the following issues for this high-risk pregnancy and has Generalized anxiety disorder; Incarcerated umbilical hernia; Panic disorder; Depression; Chronic hypertension affecting pregnancy; Depressed mood; Supervision of high risk pregnancy, antepartum; Major depressive disorder with single episode, in partial remission ([place]); Maternal obesity affecting pregnancy, antepartum; Previous cesarean delivery affecting pregnancy; and Diabetes mellitus affecting pregnancy in second trimester on their problem list. ? ?Patient reports  facial flushing was noted with 60 mg Procardia .  Contractions: Not present. Vag. Bleeding: None.  Movement: Present. Denies leaking of fluid.  ? ?The following portions of the patient's history were reviewed and updated as appropriate: allergies, current medications, past family history, past medical history, past social history, past surgical history and problem list.  ? ?Objective:  ? ?Vitals:  ? 04/13/21 1315  ?BP: (!) 147/88  ?Pulse: (!) 108  ?Weight: 282 lb (127.9 kg)  ? ? ?Fetal Status: Fetal Heart Rate (bpm): 140   Movement: Present    ? ?General:  Alert, oriented and cooperative. Patient is in no acute distress.  ?Skin: Skin is warm and dry. No rash noted.   ?Cardiovascular: Normal heart rate noted  ?Respiratory: Normal respiratory effort, no problems with respiration noted  ?Abdomen: Soft, gravid, appropriate for gestational age.  Pain/Pressure: Absent     ?Pelvic: Cervical exam deferred        ?Extremities: Normal range of motion.     ?Mental Status: Normal mood and affect. Normal behavior. Normal judgment and thought content.  ? ?Assessment and Plan:  ?Pregnancy: [id number] at [redacted]w[redacted]d ?1. Diabetes mellitus affecting pregnancy in second trimester ?FBS 97-100, PP <110 ? ?2. Maternal obesity affecting pregnancy, antepartum ?Body  mass index is 53.28 kg/m?. ? ?3. Previous cesarean delivery affecting pregnancy ?Repeat at 37 weeks ? ?4. Supervision of high risk pregnancy, antepartum ?Weekly BPP ? ?5. Chronic hypertension affecting pregnancy ?Increase labetalol ?- labetalol (NORMODYNE) 200 MG tablet; Take 3 tablets (600 mg total) by mouth 2 (two) times daily.  Dispense: 120 tablet; Refill: 1 ? ?Preterm labor symptoms and general obstetric precautions including but not limited to vaginal bleeding, contractions, leaking of fluid and fetal movement were reviewed in detail with the patient. ?Please refer to After Visit Summary for other counseling recommendations.  ? ?Return in about 1 week (around 04/20/2021). ? ?Future Appointments  ?Date Time Provider [place]  ?04/14/2021 11:15 AM [facility] NST [facility]  ?04/20/2021 10:15 AM [facility] NURSE [facility]  ?04/20/2021 10:30 AM [facility] US3 [facility]  ?04/26/2021  8:35 AM Chancy Milroy, MD [facility] None  ?04/26/2021 11:15 AM [facility] NST [facility]  ? ? ?Emeterio Reeve, MD ? ?

## 2021-04-14 ENCOUNTER — Ambulatory Visit: Payer: Medicaid Other | Admitting: *Deleted

## 2021-04-14 ENCOUNTER — Ambulatory Visit (INDEPENDENT_AMBULATORY_CARE_PROVIDER_SITE_OTHER): Payer: Medicaid Other

## 2021-04-14 VITALS — BP 149/89 | HR 95

## 2021-04-14 DIAGNOSIS — O24415 Gestational diabetes mellitus in pregnancy, controlled by oral hypoglycemic drugs: Secondary | ICD-10-CM

## 2021-04-14 DIAGNOSIS — O10919 Unspecified pre-existing hypertension complicating pregnancy, unspecified trimester: Secondary | ICD-10-CM

## 2021-04-14 DIAGNOSIS — O99210 Obesity complicating pregnancy, unspecified trimester: Secondary | ICD-10-CM

## 2021-04-14 NOTE — Progress Notes (Signed)

## 2021-04-14 NOTE — Progress Notes (Signed)
BPP performed today was also reviewed and was found to be 8/8. AFI was also normal. Continue recommended antenatal testing and prenatal care. ? ? ?Jaynie Collins, MD, FACOG ?Obstetrician Heritage manager, Faculty Practice ?Center for [employer], [facility] ?

## 2021-04-20 ENCOUNTER — Encounter: Payer: Self-pay | Admitting: *Deleted

## 2021-04-20 ENCOUNTER — Telehealth (HOSPITAL_COMMUNITY): Payer: Self-pay | Admitting: *Deleted

## 2021-04-20 ENCOUNTER — Ambulatory Visit: Payer: Medicaid Other | Attending: Maternal & Fetal Medicine

## 2021-04-20 ENCOUNTER — Ambulatory Visit: Payer: Medicaid Other | Admitting: *Deleted

## 2021-04-20 VITALS — BP 137/76 | HR 94

## 2021-04-20 DIAGNOSIS — O0990 Supervision of high risk pregnancy, unspecified, unspecified trimester: Secondary | ICD-10-CM

## 2021-04-20 DIAGNOSIS — O99210 Obesity complicating pregnancy, unspecified trimester: Secondary | ICD-10-CM | POA: Insufficient documentation

## 2021-04-20 DIAGNOSIS — O34219 Maternal care for unspecified type scar from previous cesarean delivery: Secondary | ICD-10-CM | POA: Diagnosis not present

## 2021-04-20 DIAGNOSIS — O99213 Obesity complicating pregnancy, third trimester: Secondary | ICD-10-CM | POA: Diagnosis not present

## 2021-04-20 DIAGNOSIS — O09899 Supervision of other high risk pregnancies, unspecified trimester: Secondary | ICD-10-CM | POA: Diagnosis present

## 2021-04-20 DIAGNOSIS — O24419 Gestational diabetes mellitus in pregnancy, unspecified control: Secondary | ICD-10-CM | POA: Diagnosis present

## 2021-04-20 DIAGNOSIS — O10013 Pre-existing essential hypertension complicating pregnancy, third trimester: Secondary | ICD-10-CM | POA: Diagnosis not present

## 2021-04-20 DIAGNOSIS — O10913 Unspecified pre-existing hypertension complicating pregnancy, third trimester: Secondary | ICD-10-CM | POA: Diagnosis not present

## 2021-04-20 DIAGNOSIS — Z3A35 35 weeks gestation of pregnancy: Secondary | ICD-10-CM

## 2021-04-20 DIAGNOSIS — O24415 Gestational diabetes mellitus in pregnancy, controlled by oral hypoglycemic drugs: Secondary | ICD-10-CM | POA: Diagnosis not present

## 2021-04-20 DIAGNOSIS — R768 Other specified abnormal immunological findings in serum: Secondary | ICD-10-CM | POA: Diagnosis present

## 2021-04-20 DIAGNOSIS — E669 Obesity, unspecified: Secondary | ICD-10-CM

## 2021-04-20 NOTE — Telephone Encounter (Signed)
Preadmission screen  

## 2021-04-21 ENCOUNTER — Encounter (HOSPITAL_COMMUNITY): Payer: Self-pay

## 2021-04-21 NOTE — Patient Instructions (Signed)
Axie L Escutia ? 04/21/2021 ? ? Your procedure is scheduled on:  05/04/2021 ? Arrive at [employer] [facility] [employer] on [address] at [facility]  and [address]. You are invited to use the FREE valet parking or use the Visitor's parking deck. ? Pick up the phone at the desk and dial [phone]. ? Call this number if you have problems the morning of surgery: [phone] ? Remember: ? ? Do not eat food:(After Midnight) Desp?s de medianoche. ? Do not drink clear liquids: (After Midnight) Desp?s de medianoche. ? Take these medicines the morning of surgery with A SIP OF WATER:  Take nifedipine and labetalol as prescribed ? ? Do not wear jewelry, make-up or nail polish. ? Do not wear lotions, powders, or perfumes. Do not wear deodorant. ? Do not shave 48 hours prior to surgery. ? Do not bring valuables to the hospital.  [facility] is not  ? responsible for any belongings or valuables brought to the hospital. ? Contacts, dentures or bridgework may not be worn into surgery. ? Leave suitcase in the car. After surgery it may be brought to your room. ? For patients admitted to the hospital, checkout time is 11:00 AM the day of  ?            discharge. ? ?   ? Please read over the following fact sheets that you were given:  ?   Preparing for Surgery ? ? ?

## 2021-04-26 ENCOUNTER — Ambulatory Visit (INDEPENDENT_AMBULATORY_CARE_PROVIDER_SITE_OTHER): Payer: Medicaid Other

## 2021-04-26 ENCOUNTER — Encounter: Payer: Self-pay | Admitting: Obstetrics and Gynecology

## 2021-04-26 ENCOUNTER — Ambulatory Visit (INDEPENDENT_AMBULATORY_CARE_PROVIDER_SITE_OTHER): Payer: Medicaid Other | Admitting: Obstetrics and Gynecology

## 2021-04-26 ENCOUNTER — Other Ambulatory Visit (HOSPITAL_COMMUNITY)
Admission: RE | Admit: 2021-04-26 | Discharge: 2021-04-26 | Disposition: A | Discharge status: Home / Self Care | Payer: Medicaid Other | Source: Ambulatory Visit | Attending: Obstetrics and Gynecology | Admitting: Obstetrics and Gynecology

## 2021-04-26 ENCOUNTER — Ambulatory Visit: Payer: Medicaid Other | Admitting: *Deleted

## 2021-04-26 VITALS — BP 127/83 | HR 84 | Wt 283.0 lb

## 2021-04-26 DIAGNOSIS — O24415 Gestational diabetes mellitus in pregnancy, controlled by oral hypoglycemic drugs: Secondary | ICD-10-CM | POA: Diagnosis not present

## 2021-04-26 DIAGNOSIS — O10919 Unspecified pre-existing hypertension complicating pregnancy, unspecified trimester: Secondary | ICD-10-CM | POA: Diagnosis not present

## 2021-04-26 DIAGNOSIS — O0990 Supervision of high risk pregnancy, unspecified, unspecified trimester: Secondary | ICD-10-CM | POA: Diagnosis present

## 2021-04-26 DIAGNOSIS — Z3009 Encounter for other general counseling and advice on contraception: Secondary | ICD-10-CM

## 2021-04-26 DIAGNOSIS — O99210 Obesity complicating pregnancy, unspecified trimester: Secondary | ICD-10-CM | POA: Diagnosis not present

## 2021-04-26 DIAGNOSIS — O24912 Unspecified diabetes mellitus in pregnancy, second trimester: Secondary | ICD-10-CM

## 2021-04-26 DIAGNOSIS — O34219 Maternal care for unspecified type scar from previous cesarean delivery: Secondary | ICD-10-CM

## 2021-04-26 NOTE — Progress Notes (Signed)
Subjective:  ?DEBBORA WILDT is a 31 y.o. [phone] at [redacted]w[redacted]d being seen today for ongoing prenatal care.  She is currently monitored for the following issues for this high-risk pregnancy and has Generalized anxiety disorder; Incarcerated umbilical hernia; Panic disorder; Depression; Chronic hypertension affecting pregnancy; Depressed mood; Supervision of high risk pregnancy, antepartum; Major depressive disorder with single episode, in partial remission ([place]); Maternal obesity affecting pregnancy, antepartum; Previous cesarean delivery affecting pregnancy; and Diabetes mellitus affecting pregnancy in second trimester on their problem list. ? ?Patient reports general discomforts of pregnancy.  Contractions: Not present. Vag. Bleeding: None.  Movement: Present. Denies leaking of fluid.  ? ?The following portions of the patient's history were reviewed and updated as appropriate: allergies, current medications, past family history, past medical history, past social history, past surgical history and problem list. Problem list updated. ? ?Objective:  ? ?Vitals:  ? 04/26/21 0834  ?BP: 127/83  ?Pulse: 84  ?Weight: 283 lb (128.4 kg)  ? ? ?Fetal Status: Fetal Heart Rate (bpm): 140   Movement: Present    ? ?General:  Alert, oriented and cooperative. Patient is in no acute distress.  ?Skin: Skin is warm and dry. No rash noted.   ?Cardiovascular: Normal heart rate noted  ?Respiratory: Normal respiratory effort, no problems with respiration noted  ?Abdomen: Soft, gravid, appropriate for gestational age. Pain/Pressure: Present     ?Pelvic:  Cervical exam deferred        ?Extremities: Normal range of motion.  Edema: Trace  ?Mental Status: Normal mood and affect. Normal behavior. Normal judgment and thought content.  ? ?Urinalysis:     ? ?Assessment and Plan:  ?Pregnancy: [id number] at [redacted]w[redacted]d ? ?1. Supervision of high risk pregnancy, antepartum ?Stable ?- Culture, beta strep (group b only) ?- Cervicovaginal ancillary only( [facility]) ? ?2. Diabetes mellitus affecting pregnancy in second trimester ?CBG's in goal range ?BPP/NST today ?Growth 89 % on 04/20/21 ? ?3. Previous cesarean delivery affecting pregnancy ?For repeat next week with post placenta IUD ?Reviewed with pt today ? ?4. Chronic hypertension affecting pregnancy ?Stable ?Antenatal testing as above ? ? ?Term labor symptoms and general obstetric precautions including but not limited to vaginal bleeding, contractions, leaking of fluid and fetal movement were reviewed in detail with the patient. ?Please refer to After Visit Summary for other counseling recommendations.  ?No follow-ups on file. ? ? ?Chancy Milroy, MD ?

## 2021-04-26 NOTE — Patient Instructions (Signed)
Cesarean Delivery, Care After °The following information offers guidance on how to care for yourself after your procedure. Your health care provider may also give you more specific instructions. If you have problems or questions, contact your health care provider. °What can I expect after the procedure? °After the procedure, it is common to have: °A small amount of blood or clear fluid coming from the incision. °Some redness, swelling, and pain in your incision area. °Some abdominal pain and soreness. °Vaginal bleeding (lochia). Even though you did not have a vaginal delivery, you will still have vaginal bleeding and discharge. °Pelvic cramps. °Fatigue. °You may have pain, swelling, and discomfort in the tissue between your vagina and your anus (perineum) if: °Your C-section was unplanned, and you were allowed to labor and push. °An incision was made in the area (episiotomy) or the tissue tore during attempted vaginal delivery. °Follow these instructions at home: °Medicines °Take over-the-counter and prescription medicines only as told by your health care provider. °If you were prescribed an antibiotic medicine, take it as told by your health care provider. Do not stop taking the antibiotic even if you start to feel better. °Ask your health care provider if the medicine prescribed to you requires you to avoid driving or using machinery. °Incision care ° °Follow instructions from your health care provider about how to take care of your incision. Make sure you: °Wash your hands with soap and water for an least 20 seconds before and after you change your bandage (dressing). If soap and water are not available, use hand sanitizer. °If you have a dressing, change it or remove it as told by your health care provider. °Leave stitches (sutures), skin staples, skin glue, or adhesive strips in place. These skin closures may need to stay in place for 2 weeks or longer. If adhesive strip edges start to loosen and curl up, you  may trim the loose edges. Do not remove adhesive strips completely unless your health care provider tells you to do that. °Check your incision area every day for signs of infection. Check for: °More redness, swelling, or pain. °More fluid or blood. °Warmth. °Pus or a bad smell. °Do not take baths, swim, or use a hot tub until your health care provider approves. Ask your health care provider if you may take showers. °When you cough or sneeze, hug a pillow. This helps with pain and decreases the chance of your incision opening up (dehiscing). Do this until your incision heals. °Managing constipation °Your procedure may cause constipation. To prevent or treat constipation, you may need to: °Drink enough fluid to keep your urine pale yellow. °Take over-the-counter or prescription medicines. °Eat foods that are high in fiber, such as beans, whole grains, and fresh fruits and vegetables. °Limit foods that are high in fat and processed sugars, such as fried or sweet foods. °Activity ° °If possible, have someone help you care for your baby and help with household activities for at least a few days after you leave the hospital. °Rest as much as possible. Try to rest or take a nap while your baby is sleeping. °You may have to avoid lifting. Ask your health care provider how much you can safely lift. °Return to your normal activities as told by your health care provider. Ask your health care provider what activities are safe for you. °Talk with your health care provider about when you can engage in sexual activity. This may depend on your: °Risk of infection. °How fast you heal. °Comfort   and desire to engage in sexual activity. °Lifestyle °Do not drink alcohol. This is especially important if you are breastfeeding or taking pain medicine. °Do not use any products that contain nicotine or tobacco. These products include cigarettes, chewing tobacco, and vaping devices, such as e-cigarettes. If you need help quitting, ask your  health care provider. °General instructions °Do not use tampons or douches until your health care provider approves. °Wear loose, comfortable clothing and a supportive and well-fitting bra. °If you pass a blood clot, save it and call your health care provider to discuss. Do not flush blood clots down the toilet before you get instructions from your health care provider. °Keep all follow-up visits for you and your baby. This is important. °Contact a health care provider if: °You have: °A fever. °Dizziness or light-headedness. °Bad-smelling vaginal discharge. °A blood clot pass from your vagina. °Pus, blood, or a bad smell coming from your incision. °An incision that feels warm to the touch. °More redness, swelling, or pain around your incision. °Difficulty or pain when urinating. °Nausea or vomiting. °Little or no interest in activities you used to enjoy. °Your breasts turn red or become painful or hard. °You feel unusually sad or worried. °You have questions about caring for yourself or your baby. °You have redness, swelling, and pain in an arm or leg. °Get help right away if: °You have: °Pain that does not go away or get better with medicine. °Chest pain. °Trouble breathing. °Blurred vision, spots, or flashing lights in your vision. °Thoughts about hurting yourself or your baby. °New pain in your abdomen or in one of your legs. °A severe headache that does not get better with pain medicine. °You faint. °You bleed from your vagina so much that you fill more than one sanitary pad in one hour. Bleeding should not be heavier than your heaviest period. °These symptoms may be an emergency. Get help right away. Call 911. °Do not wait to see if the symptoms will go away. °Do not drive yourself to the hospital. °Get help right away if you feel like you may hurt yourself or others, or have thoughts about taking your own life. Go to your nearest emergency room or: °Call 911. °Call the National Suicide Prevention Lifeline at  1-800-273-8255 or 988. This is open 24 hours a day. °Text the Crisis Text Line at 741741. °Summary °After the procedure, it is common to have pain at your incision site, abdominal cramping, and slight bleeding from your vagina. °Check your incision area every day for signs of infection. °Tell your health care provider about any unusual symptoms. °Keep all follow-up visits for you and your baby. This is important. °This information is not intended to replace advice given to you by your health care provider. Make sure you discuss any questions you have with your health care provider. °Document Revised: 08/05/2020 Document Reviewed: 08/05/2020 °Elsevier Patient Education © 2022 [employer]. ° °

## 2021-04-26 NOTE — Progress Notes (Signed)
Pt reports fetal movement and reports occasional swelling in hands and pelvic pressure. Pt states that fasting BG today was 101 ?

## 2021-04-27 LAB — CERVICOVAGINAL ANCILLARY ONLY
Chlamydia: NEGATIVE
Comment: NEGATIVE
Comment: NORMAL
Neisseria Gonorrhea: NEGATIVE

## 2021-04-30 LAB — CULTURE, BETA STREP (GROUP B ONLY): Strep Gp B Culture: POSITIVE — AB

## 2021-05-03 ENCOUNTER — Other Ambulatory Visit (HOSPITAL_COMMUNITY)
Admission: RE | Admit: 2021-05-03 | Discharge: 2021-05-03 | Disposition: A | Discharge status: Home / Self Care | Payer: Medicaid Other | Source: Ambulatory Visit | Attending: Obstetrics and Gynecology | Admitting: Obstetrics and Gynecology

## 2021-05-03 DIAGNOSIS — Z3A00 Weeks of gestation of pregnancy not specified: Secondary | ICD-10-CM | POA: Diagnosis not present

## 2021-05-03 DIAGNOSIS — O34219 Maternal care for unspecified type scar from previous cesarean delivery: Secondary | ICD-10-CM | POA: Diagnosis present

## 2021-05-03 LAB — CBC
HCT: 32.7 % — ABNORMAL LOW (ref 36.0–46.0)
Hemoglobin: 10.3 g/dL — ABNORMAL LOW (ref 12.0–15.0)
MCH: 24.3 pg — ABNORMAL LOW (ref 26.0–34.0)
MCHC: 31.5 g/dL (ref 30.0–36.0)
MCV: 77.3 fL — ABNORMAL LOW (ref 80.0–100.0)
Platelets: 299 10*3/uL (ref 150–400)
RBC: 4.23 MIL/uL (ref 3.87–5.11)
RDW: 16 % — ABNORMAL HIGH (ref 11.5–15.5)
WBC: 8.5 10*3/uL (ref 4.0–10.5)
nRBC: 0 % (ref 0.0–0.2)

## 2021-05-04 ENCOUNTER — Encounter (HOSPITAL_COMMUNITY)
Admission: RE | Disposition: A | Discharge status: Home / Self Care | Payer: Self-pay | Source: Home / Self Care | Attending: Family Medicine

## 2021-05-04 ENCOUNTER — Encounter: Payer: Medicaid Other | Admitting: Obstetrics & Gynecology

## 2021-05-04 ENCOUNTER — Inpatient Hospital Stay (HOSPITAL_COMMUNITY): Payer: Medicaid Other | Admitting: Anesthesiology

## 2021-05-04 ENCOUNTER — Encounter (HOSPITAL_COMMUNITY): Payer: Self-pay | Admitting: Obstetrics and Gynecology

## 2021-05-04 ENCOUNTER — Other Ambulatory Visit: Payer: Self-pay

## 2021-05-04 ENCOUNTER — Inpatient Hospital Stay (HOSPITAL_COMMUNITY)
Admission: RE | Admit: 2021-05-04 | Discharge: 2021-05-06 | DRG: 787 | Disposition: A | Discharge status: Home / Self Care | Payer: Medicaid Other | Attending: Family Medicine | Admitting: Family Medicine

## 2021-05-04 DIAGNOSIS — O24424 Gestational diabetes mellitus in childbirth, insulin controlled: Secondary | ICD-10-CM | POA: Diagnosis not present

## 2021-05-04 DIAGNOSIS — Z30430 Encounter for insertion of intrauterine contraceptive device: Secondary | ICD-10-CM | POA: Diagnosis not present

## 2021-05-04 DIAGNOSIS — F32A Depression, unspecified: Secondary | ICD-10-CM | POA: Diagnosis present

## 2021-05-04 DIAGNOSIS — F411 Generalized anxiety disorder: Secondary | ICD-10-CM | POA: Diagnosis present

## 2021-05-04 DIAGNOSIS — O0990 Supervision of high risk pregnancy, unspecified, unspecified trimester: Secondary | ICD-10-CM

## 2021-05-04 DIAGNOSIS — O99214 Obesity complicating childbirth: Secondary | ICD-10-CM | POA: Diagnosis present

## 2021-05-04 DIAGNOSIS — O99210 Obesity complicating pregnancy, unspecified trimester: Secondary | ICD-10-CM | POA: Diagnosis present

## 2021-05-04 DIAGNOSIS — Z975 Presence of (intrauterine) contraceptive device: Secondary | ICD-10-CM

## 2021-05-04 DIAGNOSIS — O24912 Unspecified diabetes mellitus in pregnancy, second trimester: Secondary | ICD-10-CM | POA: Diagnosis present

## 2021-05-04 DIAGNOSIS — Z98891 History of uterine scar from previous surgery: Secondary | ICD-10-CM

## 2021-05-04 DIAGNOSIS — O9081 Anemia of the puerperium: Secondary | ICD-10-CM | POA: Diagnosis not present

## 2021-05-04 DIAGNOSIS — O1002 Pre-existing essential hypertension complicating childbirth: Secondary | ICD-10-CM | POA: Diagnosis present

## 2021-05-04 DIAGNOSIS — O99824 Streptococcus B carrier state complicating childbirth: Secondary | ICD-10-CM | POA: Diagnosis present

## 2021-05-04 DIAGNOSIS — O10919 Unspecified pre-existing hypertension complicating pregnancy, unspecified trimester: Secondary | ICD-10-CM

## 2021-05-04 DIAGNOSIS — D62 Acute posthemorrhagic anemia: Secondary | ICD-10-CM | POA: Diagnosis not present

## 2021-05-04 DIAGNOSIS — O34219 Maternal care for unspecified type scar from previous cesarean delivery: Secondary | ICD-10-CM

## 2021-05-04 DIAGNOSIS — O24425 Gestational diabetes mellitus in childbirth, controlled by oral hypoglycemic drugs: Secondary | ICD-10-CM | POA: Diagnosis present

## 2021-05-04 DIAGNOSIS — O34211 Maternal care for low transverse scar from previous cesarean delivery: Secondary | ICD-10-CM

## 2021-05-04 DIAGNOSIS — Z3A37 37 weeks gestation of pregnancy: Secondary | ICD-10-CM

## 2021-05-04 HISTORY — DX: History of uterine scar from previous surgery: Z98.891

## 2021-05-04 LAB — GLUCOSE, CAPILLARY
Glucose-Capillary: 114 mg/dL — ABNORMAL HIGH (ref 70–99)
Glucose-Capillary: 101 mg/dL — ABNORMAL HIGH (ref 70–99)

## 2021-05-04 LAB — CREATININE, SERUM
Creatinine, Ser: 0.54 mg/dL (ref 0.44–1.00)
GFR, Estimated: >60 mL/min (ref >60)

## 2021-05-04 LAB — PREPARE RBC (CROSSMATCH)

## 2021-05-04 LAB — RPR: RPR Ser Ql: NONREACTIVE

## 2021-05-04 SURGERY — Surgical Case
Anesthesia: Spinal

## 2021-05-04 MED ORDER — NALOXONE HCL 0.4 MG/ML IJ SOLN: 0.4000 mg | INTRAMUSCULAR | Status: DC | PRN

## 2021-05-04 MED ORDER — MENTHOL 3 MG MT LOZG: 1.0000 | LOZENGE | OROMUCOSAL | Status: DC | PRN

## 2021-05-04 MED ORDER — LACTATED RINGERS IV SOLN: INTRAVENOUS | Status: DC

## 2021-05-04 MED ORDER — DIPHENHYDRAMINE HCL 50 MG/ML IJ SOLN: 12.5000 mg | INTRAMUSCULAR | Status: DC | PRN

## 2021-05-04 MED ORDER — BUPIVACAINE IN DEXTROSE 0.75-8.25 % IT SOLN
INTRATHECAL | Status: AC
  Filled 2021-05-04: qty 2

## 2021-05-04 MED ORDER — FUROSEMIDE 20 MG PO TABS
20.0000 mg | ORAL_TABLET | Freq: Every day | ORAL | Status: DC
  Administered 2021-05-05 – 2021-05-06 (×2): 20 mg via ORAL
  Filled 2021-05-04 (×2): qty 1

## 2021-05-04 MED ORDER — MORPHINE SULFATE (PF) 0.5 MG/ML IJ SOLN
INTRAMUSCULAR | Status: DC | PRN
  Administered 2021-05-04: 150 ug via INTRATHECAL

## 2021-05-04 MED ORDER — MEPERIDINE HCL 25 MG/ML IJ SOLN: 6.2500 mg | INTRAMUSCULAR | Status: DC | PRN

## 2021-05-04 MED ORDER — FENTANYL CITRATE (PF) 100 MCG/2ML IJ SOLN
INTRAMUSCULAR | Status: DC | PRN
  Administered 2021-05-04: 15 ug via INTRATHECAL

## 2021-05-04 MED ORDER — TETANUS-DIPHTH-ACELL PERTUSSIS 5-2.5-18.5 LF-MCG/0.5 IM SUSY: 0.5000 mL | PREFILLED_SYRINGE | Freq: Once | INTRAMUSCULAR | Status: DC

## 2021-05-04 MED ORDER — NIFEDIPINE ER OSMOTIC RELEASE 30 MG PO TB24
30.0000 mg | ORAL_TABLET | Freq: Every day | ORAL | Status: DC
  Administered 2021-05-04: 30 mg via ORAL
  Filled 2021-05-04: qty 1

## 2021-05-04 MED ORDER — OXYTOCIN-SODIUM CHLORIDE 30-0.9 UT/500ML-% IV SOLN
2.5000 [IU]/h | INTRAVENOUS | Status: AC
  Filled 2021-05-04: qty 500

## 2021-05-04 MED ORDER — KETOROLAC TROMETHAMINE 30 MG/ML IJ SOLN
30.0000 mg | Freq: Four times a day (QID) | INTRAMUSCULAR | Status: AC
  Administered 2021-05-04 – 2021-05-05 (×4): 30 mg via INTRAVENOUS
  Filled 2021-05-04 (×4): qty 1

## 2021-05-04 MED ORDER — DIBUCAINE (PERIANAL) 1 % EX OINT: 1.0000 "application " | TOPICAL_OINTMENT | CUTANEOUS | Status: DC | PRN

## 2021-05-04 MED ORDER — OXYTOCIN-SODIUM CHLORIDE 30-0.9 UT/500ML-% IV SOLN
INTRAVENOUS | Status: DC | PRN
  Administered 2021-05-04: 300 mL via INTRAVENOUS

## 2021-05-04 MED ORDER — SOD CITRATE-CITRIC ACID 500-334 MG/5ML PO SOLN: 30.0000 mL | ORAL | Status: DC

## 2021-05-04 MED ORDER — ONDANSETRON HCL 4 MG/2ML IJ SOLN: 4.0000 mg | Freq: Three times a day (TID) | INTRAMUSCULAR | Status: DC | PRN

## 2021-05-04 MED ORDER — ONDANSETRON HCL 4 MG/2ML IJ SOLN
INTRAMUSCULAR | Status: DC | PRN
  Administered 2021-05-04: 4 mg via INTRAVENOUS

## 2021-05-04 MED ORDER — OXYCODONE HCL 5 MG/5ML PO SOLN: 5.0000 mg | Freq: Once | ORAL | Status: DC | PRN

## 2021-05-04 MED ORDER — FENTANYL CITRATE (PF) 100 MCG/2ML IJ SOLN
INTRAMUSCULAR | Status: AC
  Filled 2021-05-04: qty 2

## 2021-05-04 MED ORDER — LEVONORGESTREL 20 MCG/DAY IU IUD
INTRAUTERINE_SYSTEM | INTRAUTERINE | Status: AC
  Filled 2021-05-04: qty 1

## 2021-05-04 MED ORDER — KETOCONAZOLE 2 % EX CREA
TOPICAL_CREAM | Freq: Two times a day (BID) | CUTANEOUS | Status: DC
  Filled 2021-05-04: qty 15

## 2021-05-04 MED ORDER — FERROUS SULFATE 325 (65 FE) MG PO TABS
325.0000 mg | ORAL_TABLET | Freq: Two times a day (BID) | ORAL | Status: DC
  Administered 2021-05-04 – 2021-05-06 (×5): 325 mg via ORAL
  Filled 2021-05-04 (×5): qty 1

## 2021-05-04 MED ORDER — TRANEXAMIC ACID-NACL 1000-0.7 MG/100ML-% IV SOLN
INTRAVENOUS | Status: DC | PRN
  Administered 2021-05-04: 1000 mg via INTRAVENOUS

## 2021-05-04 MED ORDER — SOD CITRATE-CITRIC ACID 500-334 MG/5ML PO SOLN
ORAL | Status: AC
  Filled 2021-05-04: qty 30

## 2021-05-04 MED ORDER — SIMETHICONE 80 MG PO CHEW
80.0000 mg | CHEWABLE_TABLET | Freq: Three times a day (TID) | ORAL | Status: DC
  Administered 2021-05-04 – 2021-05-06 (×6): 80 mg via ORAL
  Filled 2021-05-04 (×5): qty 1

## 2021-05-04 MED ORDER — DIPHENHYDRAMINE HCL 25 MG PO CAPS: 25.0000 mg | ORAL_CAPSULE | Freq: Four times a day (QID) | ORAL | Status: DC | PRN

## 2021-05-04 MED ORDER — DEXAMETHASONE SODIUM PHOSPHATE 10 MG/ML IJ SOLN
INTRAMUSCULAR | Status: DC | PRN
  Administered 2021-05-04: 4 mg via INTRAVENOUS

## 2021-05-04 MED ORDER — SOD CITRATE-CITRIC ACID 500-334 MG/5ML PO SOLN
30.0000 mL | ORAL | Status: AC
  Administered 2021-05-04: 30 mL via ORAL

## 2021-05-04 MED ORDER — SCOPOLAMINE 1 MG/3DAYS TD PT72
1.0000 | MEDICATED_PATCH | Freq: Once | TRANSDERMAL | Status: DC
  Administered 2021-05-04: 1.5 mg via TRANSDERMAL
  Filled 2021-05-04: qty 1

## 2021-05-04 MED ORDER — WITCH HAZEL-GLYCERIN EX PADS: 1.0000 "application " | MEDICATED_PAD | CUTANEOUS | Status: DC | PRN

## 2021-05-04 MED ORDER — STERILE WATER FOR IRRIGATION IR SOLN
Status: DC | PRN
  Administered 2021-05-04: 1

## 2021-05-04 MED ORDER — OXYCODONE HCL 5 MG PO TABS: 5.0000 mg | ORAL_TABLET | Freq: Once | ORAL | Status: DC | PRN

## 2021-05-04 MED ORDER — OXYTOCIN-SODIUM CHLORIDE 30-0.9 UT/500ML-% IV SOLN
INTRAVENOUS | Status: AC
  Filled 2021-05-04: qty 500

## 2021-05-04 MED ORDER — DIPHENHYDRAMINE HCL 25 MG PO CAPS: 25.0000 mg | ORAL_CAPSULE | ORAL | Status: DC | PRN

## 2021-05-04 MED ORDER — KETOROLAC TROMETHAMINE 30 MG/ML IJ SOLN: 30.0000 mg | Freq: Four times a day (QID) | INTRAMUSCULAR | Status: DC | PRN

## 2021-05-04 MED ORDER — ZOLPIDEM TARTRATE 5 MG PO TABS
5.0000 mg | ORAL_TABLET | Freq: Every evening | ORAL | Status: DC | PRN
Start: 2021-05-04 — End: 2021-05-06

## 2021-05-04 MED ORDER — DEXAMETHASONE SODIUM PHOSPHATE 10 MG/ML IJ SOLN
INTRAMUSCULAR | Status: AC
  Filled 2021-05-04: qty 1

## 2021-05-04 MED ORDER — ACETAMINOPHEN 10 MG/ML IV SOLN
INTRAVENOUS | Status: DC | PRN
  Administered 2021-05-04: 1000 mg via INTRAVENOUS

## 2021-05-04 MED ORDER — PHENYLEPHRINE HCL-NACL 20-0.9 MG/250ML-% IV SOLN
INTRAVENOUS | Status: DC | PRN
Start: 2021-05-04 — End: 2021-05-04
  Administered 2021-05-04: 60 ug/min via INTRAVENOUS
  Administered 2021-05-04: 30 ug/min via INTRAVENOUS

## 2021-05-04 MED ORDER — SIMETHICONE 80 MG PO CHEW: 80.0000 mg | CHEWABLE_TABLET | ORAL | Status: DC | PRN

## 2021-05-04 MED ORDER — NALOXONE HCL 4 MG/10ML IJ SOLN
1.0000 ug/kg/h | INTRAVENOUS | Status: DC | PRN
  Filled 2021-05-04: qty 5

## 2021-05-04 MED ORDER — COCONUT OIL OIL: 1.0000 "application " | TOPICAL_OIL | Status: DC | PRN

## 2021-05-04 MED ORDER — PHENYLEPHRINE HCL-NACL 20-0.9 MG/250ML-% IV SOLN
INTRAVENOUS | Status: AC
  Filled 2021-05-04: qty 250

## 2021-05-04 MED ORDER — SENNOSIDES-DOCUSATE SODIUM 8.6-50 MG PO TABS
2.0000 | ORAL_TABLET | Freq: Every day | ORAL | Status: DC
  Administered 2021-05-05 – 2021-05-06 (×2): 2 via ORAL
  Filled 2021-05-04 (×2): qty 2

## 2021-05-04 MED ORDER — ACETAMINOPHEN 10 MG/ML IV SOLN: 1000.0000 mg | Freq: Once | INTRAVENOUS | Status: DC | PRN

## 2021-05-04 MED ORDER — LEVONORGESTREL 20 MCG/DAY IU IUD
1.0000 | INTRAUTERINE_SYSTEM | Freq: Once | INTRAUTERINE | Status: AC
  Administered 2021-05-04: 1 via INTRAUTERINE

## 2021-05-04 MED ORDER — CEFAZOLIN IN SODIUM CHLORIDE 3-0.9 GM/100ML-% IV SOLN
3.0000 g | INTRAVENOUS | Status: AC
  Administered 2021-05-04: 3 g via INTRAVENOUS

## 2021-05-04 MED ORDER — PRENATAL MULTIVITAMIN CH
1.0000 | ORAL_TABLET | Freq: Every day | ORAL | Status: DC
  Administered 2021-05-05 – 2021-05-06 (×2): 1 via ORAL
  Filled 2021-05-04 (×2): qty 1

## 2021-05-04 MED ORDER — LABETALOL HCL 200 MG PO TABS
400.0000 mg | ORAL_TABLET | Freq: Once | ORAL | Status: AC
  Administered 2021-05-04: 400 mg via ORAL
  Filled 2021-05-04: qty 2

## 2021-05-04 MED ORDER — NIFEDIPINE ER OSMOTIC RELEASE 30 MG PO TB24
30.0000 mg | ORAL_TABLET | Freq: Two times a day (BID) | ORAL | Status: DC
  Administered 2021-05-04 – 2021-05-06 (×4): 30 mg via ORAL
  Filled 2021-05-04 (×4): qty 1

## 2021-05-04 MED ORDER — IBUPROFEN 600 MG PO TABS: 600.0000 mg | ORAL_TABLET | Freq: Four times a day (QID) | ORAL | Status: DC

## 2021-05-04 MED ORDER — CEFAZOLIN IN SODIUM CHLORIDE 3-0.9 GM/100ML-% IV SOLN: 3.0000 g | INTRAVENOUS | Status: DC

## 2021-05-04 MED ORDER — BUPIVACAINE IN DEXTROSE 0.75-8.25 % IT SOLN
INTRATHECAL | Status: DC | PRN
  Administered 2021-05-04: 2 mL via INTRATHECAL

## 2021-05-04 MED ORDER — SODIUM CHLORIDE 0.9% FLUSH: 3.0000 mL | INTRAVENOUS | Status: DC | PRN

## 2021-05-04 MED ORDER — POVIDONE-IODINE 10 % EX SWAB
2.0000 "application " | Freq: Once | CUTANEOUS | Status: AC
  Administered 2021-05-04: 2 via TOPICAL

## 2021-05-04 MED ORDER — HYDROMORPHONE HCL 1 MG/ML IJ SOLN: 0.2500 mg | INTRAMUSCULAR | Status: DC | PRN

## 2021-05-04 MED ORDER — SODIUM CHLORIDE 0.9 % IR SOLN
Status: DC | PRN
  Administered 2021-05-04: 1

## 2021-05-04 MED ORDER — DEXAMETHASONE SODIUM PHOSPHATE 4 MG/ML IJ SOLN
INTRAMUSCULAR | Status: AC
  Filled 2021-05-04: qty 1

## 2021-05-04 MED ORDER — OXYCODONE HCL 5 MG PO TABS
5.0000 mg | ORAL_TABLET | ORAL | Status: DC | PRN
  Administered 2021-05-05: 5 mg via ORAL
  Filled 2021-05-04: qty 1

## 2021-05-04 MED ORDER — ONDANSETRON HCL 4 MG/2ML IJ SOLN
INTRAMUSCULAR | Status: AC
  Filled 2021-05-04: qty 2

## 2021-05-04 MED ORDER — PROMETHAZINE HCL 25 MG/ML IJ SOLN: 6.2500 mg | INTRAMUSCULAR | Status: DC | PRN

## 2021-05-04 MED ORDER — ENOXAPARIN SODIUM 60 MG/0.6ML IJ SOSY
60.0000 mg | PREFILLED_SYRINGE | INTRAMUSCULAR | Status: DC
  Administered 2021-05-05 – 2021-05-06 (×2): 60 mg via SUBCUTANEOUS
  Filled 2021-05-04 (×2): qty 0.6

## 2021-05-04 MED ORDER — CEFAZOLIN IN SODIUM CHLORIDE 3-0.9 GM/100ML-% IV SOLN
INTRAVENOUS | Status: AC
  Filled 2021-05-04: qty 100

## 2021-05-04 MED ORDER — MORPHINE SULFATE (PF) 0.5 MG/ML IJ SOLN
INTRAMUSCULAR | Status: AC
  Filled 2021-05-04: qty 10

## 2021-05-04 SURGICAL SUPPLY — 38 items
APL PRP STRL LF DISP 70% ISPRP (MISCELLANEOUS) ×2
APL SKNCLS STERI-STRIP NONHPOA (GAUZE/BANDAGES/DRESSINGS)
BENZOIN TINCTURE PRP APPL 2/3 (GAUZE/BANDAGES/DRESSINGS) IMPLANT
CHLORAPREP W/TINT 26 (MISCELLANEOUS) ×6 IMPLANT
CLAMP UMBILICAL CORD (MISCELLANEOUS) ×3 IMPLANT
CLOTH BEACON ORANGE TIMEOUT ST (SAFETY) ×3 IMPLANT
DRESSING PREVENA PLUS CUSTOM (GAUZE/BANDAGES/DRESSINGS) IMPLANT
DRSG OPSITE POSTOP 4X10 (GAUZE/BANDAGES/DRESSINGS) ×3 IMPLANT
DRSG PREVENA PLUS CUSTOM (GAUZE/BANDAGES/DRESSINGS) ×2
ELECT REM PT RETURN 9FT ADLT (ELECTROSURGICAL) ×2
ELECTRODE REM PT RTRN 9FT ADLT (ELECTROSURGICAL) ×2 IMPLANT
EXTRACTOR VACUUM KIWI (MISCELLANEOUS) IMPLANT
GLOVE BIOGEL PI IND STRL 7.0 (GLOVE) ×2 IMPLANT
GLOVE BIOGEL PI INDICATOR 7.0 (GLOVE) ×1
GLOVE SURG ORTHO 8.0 STRL STRW (GLOVE) ×3 IMPLANT
GOWN STRL REUS W/TWL LRG LVL3 (GOWN DISPOSABLE) ×6 IMPLANT
KIT ABG SYR 3ML LUER SLIP (SYRINGE) IMPLANT
NDL HYPO 25X5/8 SAFETYGLIDE (NEEDLE) IMPLANT
NEEDLE HYPO 25X5/8 SAFETYGLIDE (NEEDLE) IMPLANT
NS IRRIG 1000ML POUR BTL (IV SOLUTION) ×3 IMPLANT
PACK C SECTION WH (CUSTOM PROCEDURE TRAY) ×3 IMPLANT
PAD OB MATERNITY 4.3X12.25 (PERSONAL CARE ITEMS) ×3 IMPLANT
RTRCTR C-SECT PINK 25CM LRG (MISCELLANEOUS) IMPLANT
STRIP CLOSURE SKIN 1/2X4 (GAUZE/BANDAGES/DRESSINGS) IMPLANT
SUT CHROMIC 2 0 CT 1 (SUTURE) ×1 IMPLANT
SUT MON AB-0 CT1 36 (SUTURE) ×6 IMPLANT
SUT PLAIN 0 NONE (SUTURE) IMPLANT
SUT PLAIN 2 0 XLH (SUTURE) ×1 IMPLANT
SUT VIC AB 0 CT1 27 (SUTURE) ×4
SUT VIC AB 0 CT1 27XBRD ANBCTR (SUTURE) ×4 IMPLANT
SUT VIC AB 2-0 CT1 27 (SUTURE) ×2
SUT VIC AB 2-0 CT1 TAPERPNT 27 (SUTURE) ×2 IMPLANT
SUT VIC AB 4-0 KS 27 (SUTURE) ×1 IMPLANT
SUT VIC AB 4-0 SH 27 (SUTURE) ×2
SUT VIC AB 4-0 SH 27XANBCTRL (SUTURE) ×2 IMPLANT
TOWEL OR 17X24 6PK STRL BLUE (TOWEL DISPOSABLE) ×3 IMPLANT
TRAY FOLEY W/BAG SLVR 14FR LF (SET/KITS/TRAYS/PACK) ×3 IMPLANT
WATER STERILE IRR 1000ML POUR (IV SOLUTION) ×3 IMPLANT

## 2021-05-04 NOTE — Discharge Summary (Signed)
? ?  Postpartum Discharge Summary ? ?   ?Patient Name: Marilyn Cooper ?DOB: 08/12/1990 ?MRN: [id number] ? ?Date of admission: 05/04/2021 ?Delivery date:05/04/2021  ?Delivering provider: Lynnda Shields A  ?Date of discharge: 05/06/2021 ? ?Admitting diagnosis: History of 3 cesarean sections [Z98.891] ?History of cesarean delivery [Z98.891] ?Intrauterine pregnancy: [redacted]w[redacted]d    ?Secondary diagnosis:  Principal Problem: ?  Status post repeat low transverse cesarean section ?Active Problems: ?  Generalized anxiety disorder ?  Depression ?  Chronic hypertension affecting pregnancy ?  Supervision of high risk pregnancy, antepartum ?  Maternal obesity affecting pregnancy, antepartum ?  Diabetes mellitus affecting pregnancy in second trimester ?  History of 3 cesarean sections ?  IUD (intrauterine device) in place ? ?Additional problems: Acute blood loss anemia; s/p IV Venofer    ?Discharge diagnosis: Term Pregnancy Delivered, CHTN, and GDM A2                                              ?Post partum procedures: Post-placental IUD placed ?Augmentation: N/A ?Complications: None ? ?Hospital course: Sceduled C/S   31y.o. yo [id number] 3100w0das admitted to the hospital 05/04/2021 for scheduled cesarean section with the following indication:  Elective Repeat.  Delivery details are as follows:  ?Membrane Rupture Time/Date: 10:35 AM ,05/04/2021   ?Delivery Method:C-Section, Low Transverse  ?Details of operation can be found in separate operative note.  Her hemoglobin on POD#1 was 6.8, down from 10.3. EBL at time of surgery was 263 cc. A STAT repeat CBC revealed a hemoglobin of 7. She was asymptomatic and received IV Venofer while inpatient.  She remained asymptomatic from her anemia during her admission. She was started on Procardia 30 mg BID and Lasix 20 mg daily for her chronic HTN postpartum.  She continued to have intermittent mild range blood pressures on POD#2, therefore, her home Labetalol was restarted at 200 mg BID which she  will continue upon discharge until her BP check in the office in one week.  She is ambulating, tolerating a regular diet, passing flatus, and urinating well.  Her pain and bleeding are well controlled.  She is breastfeeding well.  Patient is discharged home in stable condition on  05/06/21 ?       ?Newborn Data: ?Birth date:05/[birth year]/2021  ?Birth time:10:35 AM  ?Gender:Female  ?Living status:Living  ?Apgars:8 ,9  ?Weight:3190 g    ? ?Magnesium Sulfate received: No ?BMZ received: No ?Rhophylac: N/A ?MMR: N/A ?T-DaP: Given prenatally ?Flu: No ?Transfusion: No ? ?Physical exam  ?Vitals:  ? 05/05/21 2331 05/06/21 0500 05/06/21 0636 05/06/21 0727  ?BP: 139/72 (!) 144/63 (!) 144/75 140/87  ?Pulse: 91 79  90  ?Resp:  16    ?Temp:  97.8 ?F (36.6 ?C)    ?TempSrc:  Oral    ?SpO2:      ?Weight:      ?Height:      ? ?General: alert, cooperative, and no distress ?Lochia: appropriate ?Uterine Fundus: firm and below umbilicus  ?Incision: Prevena in place, clean and intact  ?DVT Evaluation: no LE edema or calf tenderness to palpation  ? ?Labs: ?Lab Results  ?Component Value Date  ? WBC 7.9 05/05/2021  ? HGB 7.0 (L) 05/05/2021  ? HCT 21.8 (L) 05/05/2021  ? MCV 76.5 (L) 05/05/2021  ? PLT 188 05/05/2021  ? ? ?  Latest Ref Rng &  Units 05/03/2021  ?  9:45 AM  ?CMP  ?Creatinine 0.44 - 1.00 mg/dL 0.54    ? ?Edinburgh Score: ? ?  05/04/2021  ?  2:03 PM  ?Edinburgh Postnatal Depression Scale Screening Tool  ?I have been able to laugh and see the funny side of things. 0  ?I have looked forward with enjoyment to things. 0  ?I have blamed myself unnecessarily when things went wrong. 1  ?I have been anxious or worried for no good reason. 2  ?I have felt scared or panicky for no good reason. 0  ?Things have been getting on top of me. 2  ?I have been so unhappy that I have had difficulty sleeping. 0  ?I have felt sad or miserable. 0  ?I have been so unhappy that I have been crying. 0  ?The thought of harming myself has occurred to me. 0  ?Edinburgh  Postnatal Depression Scale Total 5  ? ? ? ?After visit meds:  ?Allergies as of 05/06/2021   ?No Known Allergies ?  ? ?  ?Medication List  ?  ? ?STOP taking these medications   ? ?Accu-Chek Guide test strip ?Generic drug: glucose blood ?  ?Accu-Chek Guide w/Device Kit ?  ?Accu-Chek Softclix Lancets lancets ?  ?aspirin 81 MG chewable tablet ?  ?metFORMIN 1000 MG tablet ?Commonly known as: Glucophage ?  ? ?  ? ?TAKE these medications   ? ?furosemide 20 MG tablet ?Commonly known as: LASIX ?Take 1 tablet (20 mg total) by mouth daily for 3 days. ?  ?ibuprofen 600 MG tablet ?Commonly known as: ADVIL ?Take 1 tablet (600 mg total) by mouth every 6 (six) hours as needed (pain). ?  ?Iron Polysacch Cmplx-B12-FA 150-0.025-1 MG Caps ?Take 1 capsule by mouth daily before breakfast. ?  ?labetalol 200 MG tablet ?Commonly known as: NORMODYNE ?Take 1 tablet (200 mg total) by mouth 2 (two) times daily. Or as directed by your doctor. ?What changed:  ?how much to take ?additional instructions ?  ?NIFEdipine 30 MG 24 hr tablet ?Commonly known as: PROCARDIA-XL/NIFEDICAL-XL ?Take 1 tablet (30 mg total) by mouth in the morning and at bedtime. ?  ?oxyCODONE 5 MG immediate release tablet ?Commonly known as: Oxy IR/ROXICODONE ?Take 1 tablet (5 mg total) by mouth every 6 (six) hours as needed for severe pain or breakthrough pain. ?  ?PRENATAL VITAMIN PO ?Take 1 tablet by mouth at bedtime. ?  ? ?  ? ?  ?  ? ? ?  ?Discharge Care Instructions  ?(From admission, onward)  ?  ? ? ?  ? ?  Start     Ordered  ? 05/06/21 0000  Discharge wound care:       ?Comments: Keep dressing in place until your incision check in about 1 week in the office.  ? 05/06/21 0912  ? ?  ?  ? ?  ? ? ?Discharge home in stable condition ?Infant Feeding: Breast ?Infant Disposition: Anticipate home with mother ?Discharge instruction: per After Visit Summary and Postpartum booklet. ?Activity: Advance as tolerated. Pelvic rest for 6 weeks.  ?Diet: routine diet ?Future  Appointments: ?Future Appointments  ?Date Time Provider [place]  ?05/10/2021  1:40 PM [facility] NURSE [facility] None  ?06/16/2021  1:50 PM Shelly Bombard, MD [facility] None  ? ?Follow up Visit: ?Message sent to [facility] by Dr Higinio Plan:  ?Please schedule this patient for a In person postpartum visit in 6 weeks with the following provider: Any provider. ?Additional Postpartum F/U: Postpartum Depression checkup,  2 hour GTT, Incision check 1 week, and BP check 1 week  ?High risk pregnancy complicated by: GDM and HTN ?Delivery mode:  C-Section, Low Transverse  ?Anticipated Birth Control:  PP IUD placed ? ?05/06/2021 ?Genia Del, MD ? ? ? ?

## 2021-05-04 NOTE — Anesthesia Postprocedure Evaluation (Signed)
Anesthesia Post Note ? ?Patient: Marilyn Cooper ? ?Procedure(s) Performed: CESAREAN SECTION ? ?  ? ?Patient location during evaluation: PACU ?Anesthesia Type: Spinal ?Level of consciousness: oriented and awake and alert ?Pain management: pain level controlled ?Vital Signs Assessment: post-procedure vital signs reviewed and stable ?Respiratory status: spontaneous breathing, respiratory function stable and nonlabored ventilation ?Cardiovascular status: blood pressure returned to baseline and stable ?Postop Assessment: no headache, no backache, no apparent nausea or vomiting and spinal receding ?Anesthetic complications: no ? ? ?No notable events documented. ? ?Last Vitals:  ?Vitals:  ? 05/04/21 1303 05/04/21 1403  ?BP: (!) 147/63 (!) 143/85  ?Pulse: 71 75  ?Resp: 18 18  ?Temp: 36.8 ?C   ?SpO2: 99% 99%  ?  ?Last Pain:  ?Vitals:  ? 05/04/21 1403  ?TempSrc:   ?PainSc: 0-No pain  ? ?Pain Goal: Patients Stated Pain Goal: 4 (05/04/21 1200) ? ?  ?  ?  ?  ?  ?  ?  ? ?Lucretia Kern ? ? ? ? ?

## 2021-05-04 NOTE — Progress Notes (Signed)
?   05/04/21 1654 05/04/21 1831  ?Vital Signs  ?BP  --  (!) 141/81  ?BP Location Right Arm Right Arm  ?Patient Position (if appropriate) Orthostatic Vitals Semi-fowlers  ?Pulse Rate  --  78  ?Orthostatic Lying   ?BP- Lying 161/84  --   ?Pulse- Lying 69  --   ?Orthostatic Sitting  ?BP- Sitting (!) 168/93  --   ?Pulse- Sitting 70  --   ?Orthostatic Standing at 0 minutes  ?BP- Standing at 0 minutes (!) 156/92  --   ?Pulse- Standing at 0 minutes 96  --   ? ?Patient blood pressure showed improvements one hour after the Labetalol 400mg .  ?Will continue to monitor with standard ordered checks.  ?

## 2021-05-04 NOTE — Op Note (Signed)
Marilyn Cooper ?PROCEDURE DATE: 05/04/2021 ? ?PREOPERATIVE DIAGNOSIS: Intrauterine pregnancy at  [redacted]w[redacted]d weeks gestation;  previous uterine incision, desire for contraception ? ?POSTOPERATIVE DIAGNOSIS: The same ? ?PROCEDURE: Repeat Low Transverse Cesarean Section, Mirena IUD insertion  ? ?SURGEON:  Dr. Mariel Aloe ? ?ASSISTANT:  ?Dr. Candelaria Celeste  ?Dr. Leticia Penna  ? ?An experienced assistant was required given the standard of surgical care given the complexity of the case.  This assistant was needed for exposure, dissection, suctioning, retraction, instrument exchange, assisting with delivery with administration of fundal pressure, and for overall help during the procedure.  ? ?INDICATIONS: Marilyn Cooper is a 31 y.o. [id number] at [redacted]w[redacted]d scheduled for cesarean section secondary to previous uterine incision x3 .  The risks of cesarean section discussed with the patient included but were not limited to: bleeding which may require transfusion or reoperation; infection which may require antibiotics; injury to bowel, bladder, ureters or other surrounding organs; injury to the fetus; need for additional procedures including hysterectomy in the event of a life-threatening hemorrhage; placental abnormalities wth subsequent pregnancies, incisional problems, thromboembolic phenomenon and other postoperative/anesthesia complications. The patient concurred with the proposed plan, giving informed written consent for the procedure.   ? ?FINDINGS:  Viable female infant in cephalic presentation.  Apgars 8 and 9, weight 3190g.  Clear amniotic fluid.  Intact placenta, three vessel cord.  Normal uterus, fallopian tubes and ovaries bilaterally. Minimal adhesive disease.  ? ?ANESTHESIA:    Spinal ?INTRAVENOUS FLUIDS: 1,000 ml ?ESTIMATED BLOOD LOSS: 263 ml ?URINE OUTPUT:  300 ml ?SPECIMENS: Placenta sent to L&D ?COMPLICATIONS: None immediate ? ?PROCEDURE IN DETAIL:  The patient received intravenous antibiotics and had sequential  compression devices applied to her lower extremities while in the preoperative area.  She was then taken to the operating room where spinal anesthesia was administered and was found to be adequate. She was then placed in a dorsal supine position with a leftward tilt, and prepped and draped in a sterile manner.  A foley catheter was placed into her bladder and attached to constant gravity, which drained clear fluid throughout.  After an adequate timeout was performed, a Pfannenstiel skin incision was made with scalpel and carried through to the underlying layer of fascia. The fascia was incised in the midline and this incision was extended bilaterally using the Mayo scissors. Kocher clamps were applied to the superior aspect of the fascial incision and the underlying rectus muscles were dissected off bluntly. A similar process was carried out on the inferior aspect of the facial incision. The rectus muscles were separated in the midline bluntly and the peritoneum was entered bluntly. An Alexis retractor was placed to aid in visualization of the uterus. The uterus was rotated and tilted towards the maternal right. Attention was turned to the lower uterine segment where a transverse hysterotomy was made with a scalpel and extended bilaterally bluntly. The infant was successfully delivered, and cord was clamped and cut and infant was handed over to awaiting neonatology team.  ? ?Uterine massage was then administered and the placenta delivered intact with three-vessel cord. The uterus was then cleared of clot and debris. A Mirena IUD was then removed from it's packaging in a sterile manner and the strings were trimmed to approximately 10 cm. The IUD was placed manually at the uterine fundus and the strings were passed through the cervical os with a Kelly clamp.  ? ?The hysterotomy was closed with 0 Monocryl in a running locked fashion, and an imbricating layer  was also placed with a 0 Monocryl. Overall, excellent  hemostasis was noted. The abdomen and the pelvis were cleared of all clot and debris and the Jon Gills was removed. Hemostasis was confirmed on all surfaces.  The peritoneum was reapproximated using 2-0 vicryl running stitches. The fascia was then closed using 0 Vicryl in a running fashion. The subcutaneous layer was reapproximated with plain gut and the skin was closed with 4-0 vicryl. The patient tolerated the procedure well. Sponge, lap, instrument and needle counts were correct x 2. She was taken to the recovery room in stable condition.  ? ? ?Marilyn Stack, DO ?05/04/2021 1:02 PM  ?

## 2021-05-04 NOTE — Lactation Note (Signed)
This note was copied from a baby's chart. ?Lactation Consultation Note ? ?Patient Name: Marilyn Cooper ?Today's Date: 05/04/2021 ?Reason for consult: Initial assessment;Early term 37-38.6wks ?Age:30 hours ? ? ?P4 mom and infant resting when [facility] student entered the room.  Reviewed prenatal history with mom.  Also reviewed breast feeding basics, STS, breast stimulation, and feeding plan.   ? ?Mom stated that she used a nipple shield with her other children and thinks she may need one for this baby as well.  Mom did not wish to have a breast exam at this time so we discussed hand expression and pre pumping as options to also elongate her nipple.  Mom stated she would try hand expression prior to next feed.  Currently mom has tried to latch infant but she has been unable to sustain the latch and mom had been given formula bottles.  Reviewed pace bottle feeding and supplementation guidelines.   ? ?We discussed the need for breast stimulation, so if mom was not able to maintain a latch she would need to pump.  Per mom she is more comfortable with a wireless pump and has a mom cozy.  Infant was not showing hunger cues at this time and mom was advised to call LC for next feeding session.  ? ?DEBP setup for mom to pump.  Mom did not want to pump at this moment and will do so after dinner.  Reviewed setup, cleaning, frequency, and storage with mom.    ? ?Feeding plan: ? Feeding infant 8+ times in 24 hours based on hunger cues.   ?Hand express or hand pump prior to latching infant ?Supplement with formula as needed based on supplementation guidelines- mom's choice ?If unable to latch infant pump breast with DEBP for 15 minutes  ?Continue STS  ? ? ?All questions and concerns answered prior to [facility] student leaving the room.  Mom is aware to call LC/RN for assistance.   ? ?Addendum  ? ?Mom pre pumped and infant latched for 15 minutes.  Dad supplemented infant with and mom is pumping.   ?Maternal Data ?Has patient been  taught Hand Expression?: Yes ?Does the patient have breastfeeding experience prior to this delivery?: Yes ?How long did the patient breastfeed?: Breastfed her 9y for 2 weeks (needed to take medication and her milk supply decreased), fed her 6y for 4-5 months (had a miscarage and supply decreased), fed 77 month old for 4 months and stopped. ? ?Feeding ?Mother's Current Feeding Choice: Breast Milk and Formula ? ? ? ?Interventions ?Interventions: Breast feeding basics reviewed;Skin to skin;Breast massage;Hand express;Pre-pump if needed;Education;DEBP ? ?Discharge ?Pump: Personal;DEBP (Mom cozy s12) ? ?Consult Status ?Consult Status: Follow-up ?Date: 05/04/21 ?Follow-up type: In-patient ? ? ? ?Danne Harbor ?05/04/2021, 6:19 PM ? ? ? ?

## 2021-05-04 NOTE — H&P (Addendum)
Obstetric Preoperative History and Physical ? ?Marilyn Cooper is a 31 y.o. [phone] with IUP at 52w0dpresenting for scheduled repeat cesarean section in the setting of GDM and cHTN.  No acute concerns. This will be the patient's fourth cesarean section.  After discussion the procedure with the patient, she revealed that she had a minor bladder injury with the last cesarean section.  Op notes were reviewed and it showed a few stitches were introduced into the dome of the bladder.  This was confirmed with cystoscopy.  Pt was immediately re-operated on to have the sutures removed.  Apparently after 7 days of bladder rest with a catheter , the patient had a normal postoperative course with no long lasting issues.  Pt also desires postplacental progesterone IUD placement.  The procedure was discussed in detail. ? ?Prenatal Course ?Source of Care: [facility]  ?Pregnancy complications or risks: ?--Chronic hypertension ?--GDM  ?--C/S x3 ?--Breech presentation  ? ?Patient Active Problem List  ? Diagnosis Date Noted  ? History of 3 cesarean sections 05/04/2021  ? Diabetes mellitus affecting pregnancy in second trimester 12/09/2020  ? Maternal obesity affecting pregnancy, antepartum 12/01/2020  ? Previous cesarean delivery affecting pregnancy 12/01/2020  ? Major depressive disorder with single episode, in partial remission [place] 11/30/2020  ? Supervision of high risk pregnancy, antepartum 10/21/2020  ? Depressed mood 04/29/2020  ? Chronic hypertension affecting pregnancy 08/20/2019  ? Depression 10/08/2015  ? Panic disorder 08/03/2015  ? Incarcerated umbilical hernia 098/33/8250 ? Generalized anxiety disorder 02/04/2013  ? ?She plans to breast and formula feeding ?She desires IUD for postpartum contraception.  ? ?Prenatal labs and studies: ?ABO, Rh: --/--/A POS (04/17 05397 ?Antibody: POS (04/17 06734 ?Rubella: 5.50 (10/19 1020) ?RPR: NON REACTIVE (04/17 0945)  ?HBsAg: Negative (10/19 1020)  ?HIV: Non Reactive (01/31 1421)   ?GLPF:XTKWIOXB/- (04/10 0910) ?1 hr Glucola failed ?Genetic screening normal ?Anatomy [place]ormal ? ?Prenatal Transfer Tool  ?Maternal Diabetes: Yes:  Diabetes Type:  Insulin/Medication controlled ?Genetic Screening: Normal ?Maternal Ultrasounds/Referrals: Normal ?Fetal Ultrasounds or other Referrals:  None ?Maternal Substance Abuse:  No ?Significant Maternal Medications:  None ?Significant Maternal Lab Results: Group B Strep positive ? ?Past Medical History:  ?Diagnosis Date  ? Anemia   ? Anxiety   ? Asthma   ? childhood  ? Chronic hypertension   ? Depression   ? see social work consult, doing really well right now 'is in a good place" 3/29  ? Gestational diabetes   ? Hx of varicella   ? Obese   ? Vaginal Pap smear, abnormal   ? ? ?Past Surgical History:  ?Procedure Laterality Date  ? BLADDER REPAIR N/A 08/29/2019  ? Procedure: BLADDER REPAIR;  Surgeon: RVanessa Kick MD;  Location: [facility];  Service: Gynecology;  Laterality: N/A;  ? CESAREAN SECTION N/A 03/09/2012  ? Procedure: CESAREAN SECTION;  Surgeon: KFarrel Gobble RHarrington Challenger MD;  [facility];  Service: Obstetrics;  Laterality: N/A;  ? CESAREAN SECTION N/A 12/03/2014  ? Procedure: CESAREAN SECTION;  Surgeon: DJerelyn Charles MD;  [facility];  Service: Obstetrics;  Laterality: N/A;  ? CESAREAN SECTION N/A 08/29/2019  ? Procedure: CESAREAN SECTION;  Surgeon: RVanessa Kick MD;  [facility];  Service: Obstetrics;  Laterality: N/A;  ? COLPOSCOPY    ? CYSTOSCOPY N/A 08/29/2019  ? Procedure: CYSTOSCOPY;  Surgeon: RVanessa Kick MD;  Location: [facility];  Service: Gynecology;  Laterality: N/A;  ? HERNIA REPAIR    ? LAPAROTOMY N/A 08/29/2019  ?  Procedure: EXPLORATORY LAPAROTOMY;  Surgeon: Vanessa Kick, MD;  Location: [facility];  Service: Gynecology;  Laterality: N/A;  ? TONSILLECTOMY    ? UMBILICAL HERNIA REPAIR N/A 06/21/2014  ? Procedure: EXPLORATORY LAPAROTOMY,  SMALL BOWEL RESECTION, PRIMARY REPAIR INCARCERATED VENTRAL HERNIA;  Surgeon: Armandina Gemma, MD;  Location:  [facility]S;  Service: General;  Laterality: N/A;  ? ? ?OB History  ?Gravida Para Term Preterm AB Living  ?_0 ?SAB IAB Ectopic Multiple Live Births  ?1 3   0 3  ?  ?# Outcome Date GA Lbr Len/2nd Weight Sex Delivery Anes PTL Lv  ?8 Current           ?7 Preterm 08/29/19 [redacted]w[redacted]d 2850 g M CS-LTranv Spinal  LIV  ?   Birth Comments: preterm  ?6 Preterm 12/03/14 366w2d3620 g F CS-LTranv EPI, Spinal  LIV  ?   Birth Comments: Preeclampsia, mother treated with mag after birth  ?5 Term 03/09/12 3915w0dM CS-LTranv EPI  LIV  ?4 IAB 2013          ?3 IAB 2012          ?2 IAB 2011          ?1 SAB           ? ? ?Social History  ? ?Socioeconomic History  ? Marital status: Married  ?  Spouse name: Not on file  ? Number of children: Not on file  ? Years of education: Not on file  ? Highest education level: Not on file  ?Occupational History  ? Not on file  ?Tobacco Use  ? Smoking status: Never  ? Smokeless tobacco: Never  ?Vaping Use  ? Vaping Use: Never used  ?Substance and Sexual Activity  ? Alcohol use: No  ? Drug use: No  ? Sexual activity: Yes  ?  Birth control/protection: None  ?Other Topics Concern  ? Not on file  ?Social History Narrative  ? Not on file  ? ?Social Determinants of Health  ? ?Financial Resource Strain: Not on file  ?Food Insecurity: Not on file  ?Transportation Needs: Not on file  ?Physical Activity: Not on file  ?Stress: Not on file  ?Social Connections: Not on file  ? ? ?Family History  ?Problem Relation Age of Onset  ? Anemia Mother   ? Diabetes Father   ? Anemia Sister   ? Birth defects Brother   ?     heart disorder?  ? Hypertension Maternal Grandmother   ? Diabetes Maternal Grandmother   ? Hypertension Maternal Grandfather   ? Diabetes Maternal Grandfather   ? Hypertension Paternal Grandmother   ? Hypertension Paternal Grandfather   ? Stomach cancer Neg Hx   ? Colon cancer Neg Hx   ? Esophageal cancer Neg Hx   ? Pancreatic disease Neg Hx   ? ? ?Medications Prior to Admission  ?Medication Sig Dispense  Refill Last Dose  ? aspirin 81 MG chewable tablet Chew 1 tablet (81 mg total) by mouth daily. 30 tablet 6   ? Iron Polysacch Cmplx-B12-FA 150-0.025-1 MG CAPS Take 1 capsule by mouth daily before breakfast. 30 capsule 11   ? labetalol (NORMODYNE) 200 MG tablet Take 3 tablets (600 mg total) by mouth 2 (two) times daily. 120 tablet 1   ? metFORMIN (GLUCOPHAGE) 1000 MG tablet Take 1 tablet (1,000 mg total) by mouth at bedtime. 30 tablet 2   ? NIFEdipine (PROCARDIA-XL/NIFEDICAL-XL) 30 MG  24 hr tablet Take 30 mg by mouth in the morning and at bedtime.     ? Prenatal Vit-Fe Fumarate-FA (PRENATAL VITAMIN PO) Take 1 tablet by mouth at bedtime.     ? Accu-Chek Softclix Lancets lancets Use as instructed 100 each 12   ? Blood Glucose Monitoring Suppl (ACCU-CHEK GUIDE) w/Device KIT 1 Device by Does not apply route 4 (four) times daily. 1 kit 0   ? glucose blood (ACCU-CHEK GUIDE) test strip Use to check blood sugars four times a day was instructed 50 each 12   ? ? ?No Known Allergies ? ?Review of Systems: Negative except for what is mentioned in HPI. ? ?Physical Exam: ?BP (!) 141/93   Pulse 80   Temp 98.1 ?F (36.7 ?C) (Oral)   Resp 20   Ht 5' 1" (1.549 m)   Wt 127 kg   LMP 08/10/2020 Comment: neg preg test  SpO2 100%   BMI 52.91 kg/m?  ?FHR by Doppler: 148 bpm ?CONSTITUTIONAL: Well-developed, well-nourished female in no acute distress.  ?HENT:  Normocephalic, atraumatic. Oropharynx is clear and moist ?EYES: Conjunctivae and EOM are normal. No scleral icterus.  ?NECK: Normal range of motion, supple ?SKIN: Skin is warm and dry. No rash noted. Not diaphoretic. No erythema. ?[place]: Alert and oriented to person, place, and time. Normal reflexes, muscle tone coordination. No cranial nerve deficit noted. ?PSYCHIATRIC: Normal mood and affect. Normal behavior. ?CARDIOVASCULAR: Normal heart rate noted ?RESPIRATORY: Effort normal, no problems with respiration noted ?ABDOMEN: Soft, nontender, nondistended, gravid. Obese.  Large  pannus noted with erythematous rash on mons and incision line.  Well-healed Pfannenstiel incision.  Umbilical hernia noted with midline scar above umbilicus ?PELVIC: Deferred ?MUSCULOSKELETAL: Normal range of motion

## 2021-05-04 NOTE — Anesthesia Procedure Notes (Deleted)
Spinal

## 2021-05-04 NOTE — Anesthesia Procedure Notes (Addendum)
Combined Spinal-Epidural ?Patient location during procedure: OR ? ?Staffing ?Anesthesiologist: Lucretia Kern, MD ?Performed: anesthesiologist  ? ?Preanesthetic Checklist ?Completed: patient identified, IV checked, risks and benefits discussed, monitors and equipment checked, pre-op evaluation and timeout performed ? ?Epidural ?Patient position: sitting ?Prep: DuraPrep ?Patient monitoring: heart rate, continuous pulse ox and blood pressure ?Approach: midline ?Location: L3-L4 ?Injection technique: LOR air ? ?Needle:  ?Needle type: Tuohy (+ 12.7 cm 25g spinal needle)  ?Needle gauge: 17 G ?Needle length: 9 cm ?Needle insertion depth: 6 cm ?Catheter type: closed end flexible ?Catheter size: 19 Gauge ?Catheter at skin depth: 11 cm ? ?Assessment ?Events: blood not aspirated, injection not painful, no injection resistance, no paresthesia and negative IV test ? ?Additional Notes ?The patient was prepped and draped in the usual sterile fashion. A combined spinal-epidural was performed using a 9 cm 17g Tuohy needle and loss of resistance technique. After encountering LOR, a 12.7 cm 25g spinal needle was introduced via the Tuohy and clear CSF was aspirated prior to injection of local anesthetic. The spinal needle was removed and a 19 g flexible epidural catheter threaded prior to Tuohy removal. Patient tolerated the procedure well without complications.Reason for block:surgical anesthesia ? ? ? ?

## 2021-05-04 NOTE — Progress Notes (Signed)
No foley catheter LDA charted on patient. Patient arrived to unit with foley in place. LDA added for charting. ?

## 2021-05-04 NOTE — Transfer of Care (Signed)
Immediate Anesthesia Transfer of Care Note ? ?Patient: Marilyn Cooper ? ?Procedure(s) Performed: CESAREAN SECTION ? ?Patient Location: PACU ? ?Anesthesia Type:Spinal ? ?Level of Consciousness: awake ? ?Airway & Oxygen Therapy: Patient Spontanous Breathing ? ?Post-op Assessment: Report given to RN ? ?Post vital signs: Reviewed and stable ? ?Last Vitals:  ?Vitals Value Taken Time  ?BP 140/88 05/04/21 1145  ?Temp    ?Pulse 77 05/04/21 1148  ?Resp 16 05/04/21 1148  ?SpO2 100 % 05/04/21 1148  ?Vitals shown include unvalidated device data. ? ?Last Pain:  ?Vitals:  ? 05/04/21 0739  ?TempSrc: Oral  ?   ? ?  ? ?Complications: No notable events documented. ?

## 2021-05-04 NOTE — Anesthesia Preprocedure Evaluation (Signed)
Anesthesia Evaluation  ?Patient identified by MRN, date of birth, ID band ?Patient awake ? ? ? ?Reviewed: ?Allergy & Precautions, H&P , NPO status , Patient's Chart, lab work & pertinent test results ? ?History of Anesthesia Complications ?Negative for: history of anesthetic complications ? ?Airway ?Mallampati: II ? ?TM Distance: >3 FB ? ? ? ? Dental ?  ?Pulmonary ?asthma ,  ?  ?Pulmonary exam normal ? ? ? ? ? ? ? Cardiovascular ?hypertension,  ?Rhythm:regular Rate:Normal ? ? ?  ?Neuro/Psych ?Anxiety Depression negative neurological ROS ? negative psych ROS  ? GI/Hepatic ?negative GI ROS, Neg liver ROS,   ?Endo/Other  ?diabetes, GestationalMorbid obesity ? Renal/GU ?negative Renal ROS  ?negative genitourinary ?  ?Musculoskeletal ? ? Abdominal ?  ?Peds ? Hematology ? ?(+) Blood dyscrasia, anemia ,   ?Anesthesia Other Findings ? ? Reproductive/Obstetrics ?(+) Pregnancy ?[id number] at [redacted]w[redacted]d ?Prior C/S x3 ? ?  ? ? ? ? ? ? ? ? ? ? ? ? ? ?  ?  ? ? ? ? ? ? ? ? ?Anesthesia Physical ?Anesthesia Plan ? ?ASA: 2 ? ?Anesthesia Plan: Spinal  ? ?Post-op Pain Management:   ? ?Induction:  ? ?PONV Risk Score and Plan: Ondansetron and Treatment may vary due to age or medical condition ? ?Airway Management Planned:  ? ?Additional Equipment:  ? ?Intra-op Plan:  ? ?Post-operative Plan:  ? ?Informed Consent: I have reviewed the patients History and Physical, chart, labs and discussed the procedure including the risks, benefits and alternatives for the proposed anesthesia with the patient or authorized representative who has indicated his/her understanding and acceptance.  ? ? ? ? ? ?Plan Discussed with: Anesthesiologist ? ?Anesthesia Plan Comments:   ? ? ? ? ? ? ?Anesthesia Quick Evaluation ? ?

## 2021-05-04 NOTE — Progress Notes (Signed)
Resident notified regarding patient blood pressures and to evaluate if medication adjustments are needed. Patient states she took Procardia 30 and Labetalol 600mg  this morning. Awaiting call back or orders, if needed.  ? ?Witman, OB-Anesthesia notified that patient does not want to do ETO2 monitoring, but agrees to do continuous pulse ox.  ?

## 2021-05-04 NOTE — Progress Notes (Signed)
Message sent to Dr. Higinio Plan regarding increasing blood pressures during orthostatics, 160/90s. Patient denies pre-e symptoms. Assessed to have normal reflexes, 2 beats clonus. Dr. Higinio Plan ordered Labetalol 400mg , it was given @ 1730. Add'l dose of Procardia 30 given @ 1530. UOP of 100cc's in 4 hrs.  ?Patient ambulated well. Discussed family hx of HTN at 31 years of age and her own obstetrical hx of Mag postpartum. Info given to Dr. Higinio Plan.  ?Will recheck patient BP in one hour to give med time to work. Patient working on ordering first meal and will increase PO fluids.  ?

## 2021-05-05 LAB — CBC
HCT: 21.2 % — ABNORMAL LOW (ref 36.0–46.0)
HCT: 21.8 % — ABNORMAL LOW (ref 36.0–46.0)
Hemoglobin: 6.8 g/dL — CL (ref 12.0–15.0)
Hemoglobin: 7 g/dL — ABNORMAL LOW (ref 12.0–15.0)
MCH: 24.7 pg — ABNORMAL LOW (ref 26.0–34.0)
MCH: 24.6 pg — ABNORMAL LOW (ref 26.0–34.0)
MCHC: 32.1 g/dL (ref 30.0–36.0)
MCHC: 32.1 g/dL (ref 30.0–36.0)
MCV: 77.1 fL — ABNORMAL LOW (ref 80.0–100.0)
MCV: 76.5 fL — ABNORMAL LOW (ref 80.0–100.0)
Platelets: 188 10*3/uL (ref 150–400)
Platelets: 188 10*3/uL (ref 150–400)
RBC: 2.75 MIL/uL — ABNORMAL LOW (ref 3.87–5.11)
RBC: 2.85 MIL/uL — ABNORMAL LOW (ref 3.87–5.11)
RDW: 16.2 % — ABNORMAL HIGH (ref 11.5–15.5)
RDW: 16.1 % — ABNORMAL HIGH (ref 11.5–15.5)
WBC: 8.5 10*3/uL (ref 4.0–10.5)
WBC: 7.9 10*3/uL (ref 4.0–10.5)
nRBC: 0 % (ref 0.0–0.2)
nRBC: 0 % (ref 0.0–0.2)

## 2021-05-05 LAB — BIRTH TISSUE RECOVERY COLLECTION (PLACENTA DONATION)

## 2021-05-05 LAB — GLUCOSE, CAPILLARY: Glucose-Capillary: 96 mg/dL (ref 70–99)

## 2021-05-05 MED ORDER — SODIUM CHLORIDE 0.9 % IV SOLN
500.0000 mg | Freq: Once | INTRAVENOUS | Status: AC
  Administered 2021-05-05: 500 mg via INTRAVENOUS
  Filled 2021-05-05: qty 25

## 2021-05-05 MED ORDER — IBUPROFEN 600 MG PO TABS
600.0000 mg | ORAL_TABLET | Freq: Four times a day (QID) | ORAL | Status: DC
  Administered 2021-05-05 – 2021-05-06 (×5): 600 mg via ORAL
  Filled 2021-05-05 (×5): qty 1

## 2021-05-05 NOTE — Social Work (Signed)
CSW received consult for hx of Anxiety and Depression.  CSW met with MOB to offer support and complete assessment.   ? ?CSW met with MOB at bedside and introduced CSW role. CSW observed MOB returning to bed and FOB at bedside holding the infant. MOB presented calm and receptive to [place] visit. CSW offered MOB privacy. MOB gave CSW permission to complete the assessment in front of FOB. CSW inquired how MOB has felt since giving birth. MOB reported feeling good and felt the labor went "pretty well." MOB shared that she felt very well during the pregnancy with no concern with anxiety or depression. MOB reported she was diagnosed with depression and anxiety about 10 years ago. MOB reported since then she has had ?flare ups" but nothing too concerning. 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 MOB shared that her son was in the NICU, and she felt very sad. CSW validated and normalized MOB emotions during this time. MOB reported no therapy or medication treatment. MOB shared she talks with her spouse and likes to read books. CSW acknowledged MOB efforts. MOB identified her mom, dad, spouse, and brother as supports. CSW assessed MOB for safety. MOB denied thoughts of harm to self and others.  ? ?CSW provided education regarding the baby blues period vs. perinatal mood disorders, discussed treatment and gave resources for mental health follow up if concerns arise.  CSW recommended MOB complete a self-evaluation during the postpartum time period using the New Mom Checklist from Postpartum Progress and encouraged MOB to contact a medical professional if symptoms are noted at any time.  ? ?CSW provided review of Sudden Infant Death Syndrome (SIDS) precautions.  MOB reported she has essential items for the infant including bassinet where the infant will sleep. MOB has chosen [facility] for the infant's follow up care. CSW assessed MOB for addtioanal needs. MOB reported  no further need.  ? ?CSW identifies no further need for intervention and no barriers to discharge at this time.  ? ?Kathrin Greathouse, MSW, LCSW ?[facility]  ?Clinical Social Worker  ?[phone] ?05/05/2021  11:36 AM  ?

## 2021-05-05 NOTE — Progress Notes (Signed)
CRITICAL VALUE STICKER ? ?CRITICAL VALUE: hemoglobin -6.8 ? ?RECEIVER (on-site recipient of call):Zion Lint LPN ? ?DATE & TIME NOTIFIED: 05/05/21 0550 ? ?MESSENGER (representative from lab): ? ?MD NOTIFIED: Dr.Albert ? ?TIME OF NOTIFICATION:0605 ? ?RESPONSE: Another CBC will be ordered and the MD said she will talk to pt. ?

## 2021-05-05 NOTE — Progress Notes (Signed)
Post Operative Day 1 ? ?Subjective: ?Doing well. No acute events overnight. She reports having minimal pain and bleeding. She is eating, drinking, and ambulating without issue. She had her foley catheter removed this morning. She has not yet voided since removal. She is formula feeding which is going well. She has no other concerns at this time. ? ?Objective: ?Blood pressure 131/71, pulse 80, temperature 98.4 ?F (36.9 ?C), temperature source Oral, resp. rate 16, height 5\' 1"  (1.549 m), weight 127 kg, last menstrual period 08/10/2020, SpO2 100 %, unknown if currently breastfeeding. ? ?Physical Exam:  ?General: alert, cooperative, and no distress ?Lochia: appropriate ?Uterine Fundus: firm ?Incision: Prevena dressing in placed, intact  ?DVT Evaluation: no LE edema or calf tenderness to palpation  ? ?Recent Labs  ?  05/05/21 ?05/07/21 05/05/21 ?05/07/21  ?HGB 6.8* 7.0*  ?HCT 21.2* 21.8*  ? ? ?Assessment/Plan: ?Marilyn Cooper is a 31 y.o. 26 on POD# 1 s/p rLTCS. ? ?Progressing well. Meeting postpartum milestones. VSS. Continue routine postpartum care. ? ?#Acute blood loss anemia:  ?- Hgb noted to be 6.8, down from 10.3 post-operatively ?- EBL from surgery 263 mL ?- STAT repeat CBC obtained to confirm with Hgb of 7 ?- Patient is asymptomatic  ?- Will give IV Venofer while inpatient  ? ?#Chronic HTN: ?- On Procardia 30 mg BID ?- Received 1x dose of Labetalol last evening ?- BP overnight near goal, 120s-130s/60s-70s ?- Will initiate Lasix today  ?- Will continue to monitor BP and adjust regimen as necessary  ? ?#A2GDM:  ?- Will obtain fasting CBG while inpatient prior to discharge ?- Plan for GTT at postpartum visit  ? ?Feeding: Formula  ?Contraception: PP Mirena placed  ? ?Dispo: Plan for discharge on POD#2-3.  ? ? LOS: 1 day  ? ?[id number], MD  ?05/05/2021, 7:17 AM  ? ? ?

## 2021-05-06 ENCOUNTER — Other Ambulatory Visit (HOSPITAL_COMMUNITY): Payer: Self-pay

## 2021-05-06 MED ORDER — LABETALOL HCL 200 MG PO TABS: 400.0000 mg | ORAL_TABLET | Freq: Two times a day (BID) | ORAL | Status: DC

## 2021-05-06 MED ORDER — IBUPROFEN 600 MG PO TABS
600.0000 mg | ORAL_TABLET | Freq: Four times a day (QID) | ORAL | 0 refills | Status: DC | PRN
Start: 2021-05-06 — End: 2021-05-10
  Filled 2021-05-06: qty 40, 10d supply, fill #0

## 2021-05-06 MED ORDER — LABETALOL HCL 200 MG PO TABS
200.0000 mg | ORAL_TABLET | Freq: Two times a day (BID) | ORAL | Status: DC
Start: 2021-05-06 — End: 2021-05-06
  Administered 2021-05-06: 200 mg via ORAL
  Filled 2021-05-06: qty 1

## 2021-05-06 MED ORDER — FUROSEMIDE 20 MG PO TABS
20.0000 mg | ORAL_TABLET | Freq: Every day | ORAL | 0 refills | Status: DC
  Filled 2021-05-06: qty 3, 3d supply, fill #0

## 2021-05-06 MED ORDER — LABETALOL HCL 200 MG PO TABS
400.0000 mg | ORAL_TABLET | Freq: Two times a day (BID) | ORAL | 0 refills | Status: DC
  Filled 2021-05-06: qty 60, 15d supply, fill #0

## 2021-05-06 MED ORDER — OXYCODONE HCL 5 MG PO TABS
5.0000 mg | ORAL_TABLET | Freq: Four times a day (QID) | ORAL | 0 refills | Status: DC | PRN
Start: 2021-05-06 — End: 2022-07-05
  Filled 2021-05-06: qty 12, 3d supply, fill #0

## 2021-05-06 MED ORDER — LABETALOL HCL 200 MG PO TABS
200.0000 mg | ORAL_TABLET | Freq: Once | ORAL | Status: AC
  Administered 2021-05-06: 200 mg via ORAL
  Filled 2021-05-06: qty 1

## 2021-05-06 MED ORDER — LABETALOL HCL 200 MG PO TABS
200.0000 mg | ORAL_TABLET | Freq: Two times a day (BID) | ORAL | 0 refills | Status: DC
  Filled 2021-05-06: qty 60, 30d supply, fill #0

## 2021-05-06 MED ORDER — NIFEDIPINE ER 30 MG PO TB24
30.0000 mg | ORAL_TABLET | Freq: Two times a day (BID) | ORAL | 0 refills | Status: DC
Start: 2021-05-06 — End: 2022-07-05
  Filled 2021-05-06: qty 60, 30d supply, fill #0

## 2021-05-06 NOTE — Lactation Note (Signed)
This note was copied from a baby's chart. ?Lactation Consultation Note ? ?Patient Name: Marilyn Cooper ?Today's Date: 05/06/2021 ?Reason for consult: Follow-up assessment;Early term 37-38.6wks;Other (Comment) (baby latched when [facility] entered the room. LC noted the baby chomping. , LC had mom release the latch and LC showed mom how to have the baby hug her to latch. increased swallows.) Chomping latched was resolved.  ?Age:75 hours 6 % weight loss.  ?When baby released the nipple was well rounded. Per mom was comfortable either way. LC explained relatch was away tor prevent soreness.  ?LC reviewed BF D/C teaching. Mom aware of the [facility] resources after D/C.  ? ?Maternal Data ?  ? ?Feeding ?Mother's Current Feeding Choice: Breast Milk and Formula ? ?LATCH Score - Latch score 9 - see LC doc flow sheets.  ?  ? ?  ? ?  ? ?  ? ?  ? ?  ? ? ?Lactation Tools Discussed/Used ?  ? ?Interventions ?  ? ?Discharge ?Discharge Education: Engorgement and breast care;Warning signs for feeding baby ?Pump: DEBP;Personal ? ?Consult Status ?Consult Status: Complete ?Date: 05/06/21 ?Follow-up type: In-patient ? ? ? ?Matilde Sprang Earnest Thalman ?05/06/2021, 10:42 AM ? ? ? ?

## 2021-05-06 NOTE — Progress Notes (Signed)
Dr Elliot Gurney is notified of BP 150/93 Procardia and Labetalol. No headache no blurred vision no clonus, +2 DTR bilateral. Dr Elliot Gurney will come to see pt. ?

## 2021-05-06 NOTE — Progress Notes (Addendum)
Arrived to patient's bedside after I was informed by nurse that pt BP was 150/93. Patient and husband were both sitting at bedside upon arrival. Patient expressed her frustration about her elevated BP which she report is likely attributed to the Procardia. She endorses history of reaction with procardia which she said causes her face to turn red, raises her BP and causes generalized purities. She report that she has had this experience in the past and Dr. Debroah Loop who manages her out patient is aware of this reaction which is why he decreased the procardia and increased her home labetalol. Patient denies any headache, dizziness, SOB, chest pain or RUQ abdominal pain. Patient is reluctant to increase her Procardia and is agreable to increasing her Labetalol. Informed patient I will increase her labetalol to 400mg  BID and since she already got 200mg  this morning, I will inform the nurse to give her an additional 200mg  with plan to reassess BP this afternoon and evening. Patient express desire to be discharged and I expressed my concerns about her rising BP. I informed her that we can reassess her BP later today if it improved and shows a downward trend will consider discharge with strict return and follow-up precautions. Patient was agreeable to plan.  ? ?Vitals:  ? 05/06/21 1209 05/06/21 1319  ?BP: (!) 150/93 (!) 148/86  ?Pulse: 91 91  ?Resp:    ?Temp:    ?SpO2:    ? ? ? , MD ?PGY-1, [facility] Family Medicine Resident  ? ? ?

## 2021-05-06 NOTE — Progress Notes (Signed)
Dr Elliot Gurney is notified of pt's BPs 140/68 and 134/70 and pt's request for discharge. ?

## 2021-05-07 ENCOUNTER — Other Ambulatory Visit (HOSPITAL_COMMUNITY): Payer: Self-pay

## 2021-05-07 LAB — TYPE AND SCREEN
ABO/RH(D): A POS
Antibody Screen: POSITIVE
Unit division: 0
Unit division: 0

## 2021-05-07 LAB — BPAM RBC
Blood Product Expiration Date: 202305042359
Blood Product Expiration Date: 202305132359
Unit Type and Rh: 6200
Unit Type and Rh: 6200

## 2021-05-10 ENCOUNTER — Other Ambulatory Visit: Payer: Self-pay

## 2021-05-10 ENCOUNTER — Ambulatory Visit: Payer: Medicaid Other

## 2021-05-10 ENCOUNTER — Encounter (HOSPITAL_COMMUNITY): Payer: Self-pay | Admitting: Obstetrics and Gynecology

## 2021-05-10 ENCOUNTER — Inpatient Hospital Stay (HOSPITAL_COMMUNITY)
Admission: AD | Admit: 2021-05-10 | Discharge: 2021-05-10 | Disposition: A | Discharge status: Home / Self Care | Payer: Medicaid Other | Attending: Family Medicine | Admitting: Family Medicine

## 2021-05-10 DIAGNOSIS — R519 Headache, unspecified: Secondary | ICD-10-CM | POA: Insufficient documentation

## 2021-05-10 DIAGNOSIS — O9943 Diseases of the circulatory system complicating the puerperium: Secondary | ICD-10-CM | POA: Diagnosis not present

## 2021-05-10 DIAGNOSIS — R600 Localized edema: Secondary | ICD-10-CM | POA: Diagnosis present

## 2021-05-10 DIAGNOSIS — M25532 Pain in left wrist: Secondary | ICD-10-CM | POA: Diagnosis not present

## 2021-05-10 DIAGNOSIS — Z98891 History of uterine scar from previous surgery: Secondary | ICD-10-CM | POA: Diagnosis not present

## 2021-05-10 DIAGNOSIS — Z79899 Other long term (current) drug therapy: Secondary | ICD-10-CM | POA: Insufficient documentation

## 2021-05-10 DIAGNOSIS — I808 Phlebitis and thrombophlebitis of other sites: Secondary | ICD-10-CM | POA: Insufficient documentation

## 2021-05-10 DIAGNOSIS — O909 Complication of the puerperium, unspecified: Secondary | ICD-10-CM | POA: Insufficient documentation

## 2021-05-10 MED ORDER — IBUPROFEN 800 MG PO TABS: 800.0000 mg | ORAL_TABLET | Freq: Three times a day (TID) | ORAL | 2 refills | Status: DC | PRN

## 2021-05-10 MED ORDER — ENOXAPARIN SODIUM 120 MG/0.8ML IJ SOSY
120.0000 mg | PREFILLED_SYRINGE | Freq: Once | INTRAMUSCULAR | Status: AC
Start: 2021-05-10 — End: 2021-05-10
  Administered 2021-05-10: 120 mg via SUBCUTANEOUS
  Filled 2021-05-10: qty 0.8

## 2021-05-10 MED ORDER — ENOXAPARIN SODIUM 120 MG/0.8ML IJ SOSY: 120.0000 mg | PREFILLED_SYRINGE | Freq: Two times a day (BID) | INTRAMUSCULAR | 0 refills | Status: DC

## 2021-05-10 MED ORDER — ENOXAPARIN SODIUM 40 MG/0.4ML IJ SOSY: 40.0000 mg | PREFILLED_SYRINGE | Freq: Once | INTRAMUSCULAR | Status: DC

## 2021-05-10 NOTE — MAU Provider Note (Signed)
?History  ?  ? ?CSN: [id number] ? ?Arrival date and time: 05/10/21 1053 ? ? Event Date/Time  ? First Provider Initiated Contact with Patient 05/10/21 1221   ?  ? ?Chief Complaint  ?Patient presents with  ? Headache  ? Arm Swelling  ? ?HPI ?This is a 31 year old G8 P2-2-4-4 who is 6 days status post cesarean section.  She presents with erythema, pain in her left wrist, which is where she had the IV.  Her pain started on Saturday and has been increasing in pain. She is taking ibuprofen 1200mg , which does help. Swelling has progressed up arm. ? ?OB History   ? ? Gravida  ?8  ? Para  ?4  ? Term  ?2  ? Preterm  ?2  ? AB  ?4  ? Living  ?4  ?  ? ? SAB  ?1  ? IAB  ?3  ? Ectopic  ?   ? Multiple  ?0  ? Live Births  ?4  ?   ?  ?  ? ? ?Past Medical History:  ?Diagnosis Date  ? Anemia   ? Anxiety   ? Asthma   ? childhood  ? Chronic hypertension   ? Depression   ? see social work consult, doing really well right now 'is in a good place" 3/29  ? Gestational diabetes   ? Hx of varicella   ? Obese   ? Vaginal Pap smear, abnormal   ? ? ?Past Surgical History:  ?Procedure Laterality Date  ? BLADDER REPAIR N/A 08/29/2019  ? Procedure: BLADDER REPAIR;  Surgeon: Vanessa Kick, MD;  Location: [facility] LD ORS;  Service: Gynecology;  Laterality: N/A;  ? CESAREAN SECTION N/A 03/09/2012  ? Procedure: CESAREAN SECTION;  Surgeon: Farrel Gobble. Harrington Challenger, MD;  Location: [place] ORS;  Service: Obstetrics;  Laterality: N/A;  ? CESAREAN SECTION N/A 12/03/2014  ? Procedure: CESAREAN SECTION;  Surgeon: Jerelyn Charles, MD;  Location: [place] ORS;  Service: Obstetrics;  Laterality: N/A;  ? CESAREAN SECTION N/A 08/29/2019  ? Procedure: CESAREAN SECTION;  Surgeon: Vanessa Kick, MD;  Location: [facility] LD ORS;  Service: Obstetrics;  Laterality: N/A;  ? CESAREAN SECTION N/A 05/04/2021  ? Procedure: CESAREAN SECTION;  Surgeon: Griffin Basil, MD;  Location: [facility] LD ORS;  Service: Obstetrics;  Laterality: N/A;  ? COLPOSCOPY    ? CYSTOSCOPY N/A 08/29/2019  ? Procedure: CYSTOSCOPY;  Surgeon: Vanessa Kick, MD;  Location: [facility] ORS;  Service: Gynecology;  Laterality: N/A;  ? HERNIA REPAIR    ? LAPAROTOMY N/A 08/29/2019  ? Procedure: EXPLORATORY LAPAROTOMY;  Surgeon: Vanessa Kick, MD;  Location: [facility] ORS;  Service: Gynecology;  Laterality: N/A;  ? TONSILLECTOMY    ? UMBILICAL HERNIA REPAIR N/A 06/21/2014  ? Procedure: EXPLORATORY LAPAROTOMY,  SMALL BOWEL RESECTION, PRIMARY REPAIR INCARCERATED VENTRAL HERNIA;  Surgeon: Armandina Gemma, MD;  Location: [facility] ORS;  Service: General;  Laterality: N/A;  ? ? ?Family History  ?Problem Relation Age of Onset  ? Anemia Mother   ? Vision loss Father   ? Diabetes Father   ? Anemia Sister   ? Birth defects Brother   ?     heart disorder?  ? Hypertension Maternal Grandmother   ? Diabetes Maternal Grandmother   ? Hypertension Maternal Grandfather   ? Diabetes Maternal Grandfather   ? Hypertension Paternal Grandmother   ? Hypertension Paternal Grandfather   ? Stomach cancer Neg Hx   ? Colon cancer Neg Hx   ? Esophageal cancer Neg Hx   ?  Pancreatic disease Neg Hx   ? ? ?Social History  ? ?Tobacco Use  ? Smoking status: Never  ? Smokeless tobacco: Never  ?Vaping Use  ? Vaping Use: Never used  ?Substance Use Topics  ? Alcohol use: No  ? Drug use: No  ? ? ?Allergies: No Known Allergies ? ?Medications Prior to Admission  ?Medication Sig Dispense Refill Last Dose  ? labetalol (NORMODYNE) 200 MG tablet Take 2 tablets (400 mg total) by mouth 2 (two) times daily. Or as directed by your doctor. 60 tablet 0 05/10/2021  ? NIFEdipine (ADALAT CC) 30 MG 24 hr tablet Take 1 tablet (30 mg total) by mouth in the morning and at bedtime. 60 tablet 0 05/10/2021  ? Prenatal Vit-Fe Fumarate-FA (PRENATAL VITAMIN PO) Take 1 tablet by mouth at bedtime.   05/10/2021  ? furosemide (LASIX) 20 MG tablet Take 1 tablet (20 mg total) by mouth daily for 3 days. 3 tablet 0   ? ibuprofen (ADVIL) 600 MG tablet Take 1 tablet (600 mg total) by mouth every 6 (six) hours as needed (pain). 40 tablet 0   ? Iron Polysacch  Cmplx-B12-FA 150-0.025-1 MG CAPS Take 1 capsule by mouth daily before breakfast. 30 capsule 11   ? oxyCODONE (OXY IR/ROXICODONE) 5 MG immediate release tablet Take 1 tablet (5 mg total) by mouth every 6 (six) hours as needed for severe pain or breakthrough pain. 12 tablet 0   ? ? ?Review of Systems ?Physical Exam  ? ?Blood pressure 128/76, pulse 90, temperature 98.2 ?F (36.8 ?C), temperature source Oral, resp. rate 18, height 5\' 1"  (1.549 m), weight 124.8 kg, SpO2 98 %, currently breastfeeding. ? ?Physical Exam ?Vitals reviewed.  ?Constitutional:   ?   Appearance: She is well-developed.  ?Cardiovascular:  ?   Rate and Rhythm: Normal rate and regular rhythm.  ?Pulmonary:  ?   Effort: Pulmonary effort is normal.  ?   Breath sounds: Normal breath sounds.  ?Abdominal:  ?   Comments: Wound vac removed. Incision clean. No erythema. Slight oozing from incision after wound vac removed.  ?Skin: ?   Comments: Tender cord palpated in the left wrist that extends about 3-4 cm up arm.  ?Neurological:  ?   Mental Status: She is alert.  ?Psychiatric:     ?   Mood and Affect: Mood normal.     ?   Behavior: Behavior normal.  ? ? ?MAU Course  ?Procedures ? ?MDM ? ? ?Assessment and Plan  ? ?1. Status post repeat low transverse cesarean section   ?2. Superficial thrombophlebitis of left upper extremity   ? ?Due to risk factor of being postpartum, she is at high risk for DVE. Recommendation is to be on therapeutic lovenox. Patient amenable. Return precautions given. ? ?Truett Mainland ?05/10/2021, 1:00 PM  ?

## 2021-05-10 NOTE — MAU Note (Signed)
Marilyn Cooper is a 31 y.o. here in MAU reporting: PP s/p RCS on 4/18. Left extremity is swollen. Swelling goes from hand up to her elbow and there is a knot at the end of where her IV was. BP at home has been good. Intermittent headache. Is taking lasix.  ? ?Onset of complaint: ongoing ? ?Pain score: 3/10 ? ?Vitals:  ? 05/10/21 1103  ?BP: 128/76  ?Pulse: 90  ?Resp: 18  ?Temp: 98.2 ?F (36.8 ?C)  ?SpO2: 98%  ?   ?Lab orders placed from triage: none ? ?

## 2021-05-10 NOTE — MAU Note (Signed)
Swelling noted left hand, left wrist, and up to left elbow.  ?

## 2021-05-12 ENCOUNTER — Telehealth (HOSPITAL_COMMUNITY): Payer: Self-pay

## 2021-05-12 ENCOUNTER — Encounter: Payer: Medicaid Other | Admitting: Obstetrics

## 2021-05-12 NOTE — Telephone Encounter (Signed)
"  Doing ok. I came to MAU and I found out I have a blood clot in my wrist. I'm on blood thinners. The fluid is going away in my hand and wrist. It is looking much better. I have a follow up appointment scheduled with my doctor." RN encouraged patient to reach out to her OB with any more concerns. Patient has no other questions or concerns about her healing. ? ?Pecola Leisure is doing fine. I think the jaundice is coming down. She is eating more regularly and much more alert. We have an appointment tomorrow with the pediatrician. She sleeps in a bassinet in our bedroom and in a dock a tot in the living room during the day." RN provided safe sleep education. RN reviewed ABC's of safe sleep with patient. Patient declines any questions or concerns about baby. ? ?EPDS score is 8. ? ?Marcelino Duster Melia Hopes,RN3,MSN,RNC-MNN ?05/12/2021,1443 ?

## 2021-05-24 ENCOUNTER — Ambulatory Visit: Payer: Medicaid Other | Admitting: Obstetrics and Gynecology

## 2021-06-02 ENCOUNTER — Ambulatory Visit (INDEPENDENT_AMBULATORY_CARE_PROVIDER_SITE_OTHER): Payer: Medicaid Other | Admitting: Obstetrics & Gynecology

## 2021-06-02 ENCOUNTER — Encounter: Payer: Self-pay | Admitting: Obstetrics & Gynecology

## 2021-06-02 VITALS — BP 139/83 | HR 93 | Ht 61.0 in | Wt 272.0 lb

## 2021-06-02 DIAGNOSIS — I808 Phlebitis and thrombophlebitis of other sites: Secondary | ICD-10-CM

## 2021-06-02 HISTORY — DX: Phlebitis and thrombophlebitis of other sites: I80.8

## 2021-06-02 MED ORDER — ENOXAPARIN SODIUM 120 MG/0.8ML IJ SOSY: 120.0000 mg | PREFILLED_SYRINGE | Freq: Two times a day (BID) | INTRAMUSCULAR | 0 refills | Status: DC

## 2021-06-02 NOTE — Progress Notes (Signed)
? ?OFFICE VISIT NOTE ? ?History:  ? Marilyn Cooper is a 31 y.o. [phone] here today for follow up of postpartum left upper extremity thrombophlebitis diagnosed during MAU visit on 05/10/2021. She is s/p cesarean section on 05/04/21.  Reports symptoms improved after two weeks of prescribed Lovenox therapy, no other concerns apart from bruising on abdomen from injection sites.  She denies any abnormal vaginal discharge, bleeding, pelvic pain or other concerns.  ?  ?Past Medical History:  ?Diagnosis Date  ? Anemia   ? Anxiety   ? Asthma   ? childhood  ? Chronic hypertension   ? Depression   ? see social work consult, doing really well right now 'is in a good place" 3/29  ? Gestational diabetes   ? Hx of varicella   ? Obese   ? Vaginal Pap smear, abnormal   ? ? ?Past Surgical History:  ?Procedure Laterality Date  ? BLADDER REPAIR N/A 08/29/2019  ? Procedure: BLADDER REPAIR;  Surgeon: Waynard Reeds, MD;  Location: [facility];  Service: Gynecology;  Laterality: N/A;  ? CESAREAN SECTION N/A 03/09/2012  ? Procedure: CESAREAN SECTION;  Surgeon: Freddrick March. Tenny Craw, MD;  Location: [facility] OR[facility];  Service: Obstetrics;  Laterality: N/A;  ? CESAREAN SECTION N/A 12/03/2014  ? Procedure: CESAREAN SECTION;  Surgeon: Marlow Baars, MD;  Location: [facility] ORS;  Service: Obstetrics;  Laterality: N/A;  ? CESAREAN SECTION N/A 08/29/2019  ? Procedure: CESAREAN SECTION;  Surgeon: Waynard Reeds, MD;  Location: [facility] LD ORS;  Service: Obstetrics;  Laterality: N/A;  ? CESAREAN SECTION N/A 05/04/2021  ? Procedure: CESAREAN SECTION;  Surgeon: Warden Fillers, MD;  Location: [facility];  Service: Obstetrics;  Laterality: N/A;  ? COLPOSCOPY    ? CYSTOSCOPY N/A 08/29/2019  ? Procedure: CYSTOSCOPY;  Surgeon: Waynard Reeds, MD;  Location: [facility];  Service: Gynecology;  Laterality: N/A;  ? HERNIA REPAIR    ? LAPAROTOMY N/A 08/29/2019  ? Procedure: EXPLORATORY LAPAROTOMY;  Surgeon: Waynard Reeds, MD;  Location: [facility];  Service: Gynecology;  Laterality: N/A;  ? TONSILLECTOMY     ? UMBILICAL HERNIA REPAIR N/A 06/21/2014  ? Procedure: EXPLORATORY LAPAROTOMY,  SMALL BOWEL RESECTION, PRIMARY REPAIR INCARCERATED VENTRAL HERNIA;  Surgeon: Darnell Level, MD;  Location: [facility] ORS;  Service: General;  Laterality: N/A;  ? ? ?The following portions of the patient's history were reviewed and updated as appropriate: allergies, current medications, past family history, past medical history, past social history, past surgical history and problem list.  ? ?Review of Systems:  ?Pertinent items noted in HPI and remainder of comprehensive ROS otherwise negative. ? ?Physical Exam:  ?BP 139/83   Pulse 93   Ht 5\' 1"  (1.549 m)   Wt 272 lb (123.4 kg)   Breastfeeding Yes   BMI 51.39 kg/m?  ?CONSTITUTIONAL: Well-developed, well-nourished female in no acute distress.  ?HEENT:  Normocephalic, atraumatic. External right and left ear normal. No scleral icterus.  ?NECK: Normal range of motion, supple, no masses noted on observation ?SKIN: No rash noted. Not diaphoretic. No erythema. No pallor. ?MUSCULOSKELETAL: Normal range of motion. No edema noted. ?NEUROLOGIC: Alert and oriented to person, place, and time. Normal muscle tone coordination. No cranial nerve deficit noted. ?PSYCHIATRIC: Normal mood and affect. Normal behavior. Normal judgment and thought content. ?CARDIOVASCULAR: Normal heart rate noted ?RESPIRATORY: Effort and breath sounds normal, no problems with respiration noted ?ABDOMEN: No masses noted. No other overt distention noted.  Some bruising noted around injection sites. ?PELVIC: Deferred ?EXTREMITIES: Resolved  left upper extremity thrombophlebitis. Normal range of motion of hands. No concerning lesions/symptoms. ?    ?Assessment and Plan:  ?   ?1. Superficial thrombophlebitis of left upper extremity ?Consulted with Dr. Adrian Blackwater, he recommended to continue therapy for a total of six weeks to avoid recurrence or worse given highly thrombogenic status (postpartum, postoperative state, BMI).  Refill of  Lovenox prescribed to cover this, patient advised to alternate sites of injection to help with bruising.  No other concerns for today. ?- enoxaparin (LOVENOX) 120 MG/0.8ML injection; Inject 0.8 mLs (120 mg total) into the skin every 12 (twelve) hours.  Dispense: 48 mL; Refill: 0 ? ?Please refer to After Visit Summary for other counseling recommendations.  ? ?Return for Postpartum visit as scheduled.   ? ?I spent 20 minutes dedicated to the care of this patient including pre-visit review of records, face to face time with the patient discussing her conditions and treatments and post visit orders. ? ? ? ?Jaynie Collins, MD, FACOG ?Obstetrician Heritage manager, Faculty Practice ?Center for [employer], [facility] ? ?

## 2021-06-02 NOTE — Progress Notes (Addendum)
31 y.o presents for FU, she is taking blood thinner.  C/o of bruising on her stomach.  ?

## 2021-06-10 ENCOUNTER — Ambulatory Visit: Payer: Self-pay

## 2021-06-10 NOTE — Lactation Note (Signed)
This note was copied from a baby's chart. Lactation Consultation Note  Patient Name: Marilyn Cooper [id number] Date: 06/10/2021 Reason for consult: Initial assessment;Other (Comment) (readmit: diagnosis FTT) Age:31 years old  Mom states she is very discouraged about baby's wt. Gain.  She breastfed for 2 weeks before beginning to pump at 3.  She would pump 4 X a day and collect 3 oz.  Now she will collect 1/2 oz. From each breast.  BAby has been getting 2oz after BF in the past 2 weeks.  Last week baby received 1, 3oz bottle.  Baby latched on the left, mom cradled her in her lap, baby slipped multiple times from the nipple.  Little support was given for support to stay latched well.  Mom states baby usually falls asleep after 3 minutes at the breast and will not wake.  Swallows heard with MER, (let down), and then baby slept.  LC encouraged massage and compression to the breast to encourage infant to continue feeding.  Baby was moved to the other breast.  LC provided pillows and a rolled blanket under the breast for more support, encouraged the football hold and mom supporting her breast tissue when infant latches.  Baby, latched deeply and good rythmic swallows noted.  Mom felt more comfortable and baby did not slip or pull off.  AFter 8 minutes, LC bottle fed mom's expressed breast milk.Yellow nipple used and baby could not form a seal nor move milk from the bottle.  Clicking and visible gaps on the sides where seal was broken with sucks.  Blue nipple used and infant had a few clicks and squeaks on the nipple but paced herself well, no leaking, and no distress noted with good transfer.  LC burped after 2oz.  Baby cueing, 1 oz more given.  Encouraged mom to pump every 3 hours or after BF.  Mom will BF, STS, then bottle feed afterwards with her EBM or formula.    SLP entered when [facility] was leaving; report given.   Maternal Data Does the patient have breastfeeding experience prior to this delivery?:  Yes How long did the patient breastfeed?: 4 months with two of her previous children  Feeding Mother's Current Feeding Choice: Breast Milk and Formula Nipple Type: Slow - flow (blue nipple)  LATCH Score Latch: Repeated attempts needed to sustain latch, nipple held in mouth throughout feeding, stimulation needed to elicit sucking reflex.  Audible Swallowing: A few with stimulation  Type of Nipple: Everted at rest and after stimulation  Comfort (Breast/Nipple): Soft / non-tender  Hold (Positioning): Assistance needed to correctly position infant at breast and maintain latch.  LATCH Score: 7   Lactation Tools Discussed/Used Tools: Bottle;Pump Breast pump type: Double-Electric Breast Pump Pump Education: Setup, frequency, and cleaning;Milk Storage Reason for Pumping: collect to feed back to baby, stimulate milk supply Pumping frequency: encouraged every 3 hours or when baby eats Pumped volume: 3 mL (3 ounces last pump/ had fed last 5 hours ago)  Interventions Interventions: Position options;Support pillows;Adjust position;Skin to skin;Breast massage;Breast compression  Discharge Pump: DEBP;Personal (mom has a mom cozy wearable: plans to get one from [employer]/ [employer].)  Consult Status Consult Status: Follow-up Date: 06/11/21 Follow-up type: In-patient    Maryruth Hancock [facility] 06/10/2021, 9:38 AM

## 2021-06-11 ENCOUNTER — Ambulatory Visit: Payer: Self-pay

## 2021-06-11 NOTE — Lactation Note (Signed)
This note was copied from a baby's chart. Lactation Consultation Note  Patient Name: Marilyn Cooper [id number] Date: 06/11/2021 Reason for consult: Follow-up assessment;Other (Comment) Age:30 wk.o.  Mom was concerned that her milk supply had dropped over the last week. Mom has been using size 27 flanges while in the hospital (sometimes she uses 24 or 27 flanges at home). On assessment, it appeared that Mom would benefit from size 21 flanges. I observed her pump--Mom was comfortable with the size 21 flanges and was able to obtain 2.5 oz (an ounce more than what she had been getting). Mom was very, very pleased.   Mom knows to use the size 21 flanges with pumping (although Mom knows to assess for switching to 24 during a pumping session, if needed).    Feeding Nipple Type: Dr. Roosvelt Harps level 4    Lactation Tools Discussed/Used Tools: Pump;Flanges Flange Size: 21 Breast pump type: Double-Electric Breast Pump  Interventions Interventions: Education;DEBP    Matthias Hughs [facility] 06/11/2021, 2:23 PM

## 2021-06-11 NOTE — Lactation Note (Signed)
This note was copied from a baby's chart. Lactation Consultation Note  Patient Name: Marilyn Cooper Today's Date: 06/11/2021   Age:30 wk.o.  LC visit was attempted, but Mom was not in room.  Lurline Hare [facility] 06/11/2021, 12:56 PM

## 2021-06-16 ENCOUNTER — Ambulatory Visit (INDEPENDENT_AMBULATORY_CARE_PROVIDER_SITE_OTHER): Payer: Medicaid Other | Admitting: Obstetrics

## 2021-06-16 ENCOUNTER — Encounter: Payer: Self-pay | Admitting: Obstetrics

## 2021-06-16 DIAGNOSIS — O135 Gestational [pregnancy-induced] hypertension without significant proteinuria, complicating the puerperium: Secondary | ICD-10-CM

## 2021-06-16 DIAGNOSIS — Z98891 History of uterine scar from previous surgery: Secondary | ICD-10-CM | POA: Diagnosis not present

## 2021-06-16 DIAGNOSIS — Z6841 Body Mass Index (BMI) 40.0 and over, adult: Secondary | ICD-10-CM

## 2021-06-16 DIAGNOSIS — Z0000 Encounter for general adult medical examination without abnormal findings: Secondary | ICD-10-CM

## 2021-06-16 NOTE — Progress Notes (Signed)
..   Post Partum Visit Note  Marilyn Cooper is a 31 y.o. [phone] female who presents for a postpartum visit. She is 6 weeks postpartum following a repeat cesarean section.  I have fully reviewed the prenatal and intrapartum course. The delivery was at 37.0 gestational weeks.  Anesthesia: spinal. Postpartum course has been challenging. Baby is doing well. Baby is feeding by both breast and bottle - Similac Advance. Bleeding staining only. Bowel function is normal. Bladder function is normal. Patient is not sexually active. Contraception method is IUD. Postpartum depression screening: negative.   The pregnancy intention screening data noted above was reviewed. Potential methods of contraception were discussed. The patient elected to proceed with No data recorded.   Edinburgh Postnatal Depression Scale - 06/16/21 1409       Edinburgh Postnatal Depression Scale:  In the Past 7 Days   I have been able to laugh and see the funny side of things. 0    I have looked forward with enjoyment to things. 0    I have blamed myself unnecessarily when things went wrong. 2    I have been anxious or worried for no good reason. 0    I have felt scared or panicky for no good reason. 0    Things have been getting on top of me. 0    I have been so unhappy that I have had difficulty sleeping. 0    I have felt sad or miserable. 0    I have been so unhappy that I have been crying. 0    The thought of harming myself has occurred to me. 0    Edinburgh Postnatal Depression Scale Total 2             Health Maintenance Due  Topic Date Due   FOOT EXAM  Never done   OPHTHALMOLOGY EXAM  Never done   URINE MICROALBUMIN  Never done   COVID-19 Vaccine (2 - Mixed Product series) 11/06/2019   HEMOGLOBIN A1C  05/05/2021    The following portions of the patient's history were reviewed and updated as appropriate: allergies, current medications, past family history, past medical history, past social history, past  surgical history, and problem list.  Review of Systems A comprehensive review of systems was negative.  Objective:  BP (!) 172/101   Pulse 91   Ht 5\' 1"  (1.549 m)   Wt 284 lb 3.2 oz (128.9 kg)   Breastfeeding Yes   BMI 53.70 kg/m    General:  alert and no distress   Breasts:  normal  Lungs: clear to auscultation bilaterally  Heart:  regular rate and rhythm, S1, S2 normal, no murmur, click, rub or gallop  Abdomen: soft, non-tender; bowel sounds normal; no masses,  no organomegaly and incision is clean, dry, intact and non tender    Wound well approximated incision  GU exam:  not indicated       Assessment:   1. Postpartum care following cesarean delivery  2. Status post repeat low transverse cesarean section  3. Hypertension in pregnancy, transient, delivered with postpartum complication, non-compliant - continue Labetalol / Nifedipine XL  4. Class 3 severe obesity due to excess calories without serious comorbidity with body mass index (BMI) of 50.0 to 59.9 in adult (HCC) - weight reduction with the aid of dietary changes, exercise and behavioral modification recommended - may consider consultation for Bariatric Surgery in the future  5. Routine adult health maintenance Rx: - Ambulatory referral to Internal Medicine  Plan:   Essential components of care per ACOG recommendations:  1.  Mood and well being: Patient with negative depression screening today. Reviewed local resources for support.  - Patient tobacco use? No.   - hx of drug use? No.    2. Infant care and feeding:  -Patient currently breastmilk feeding? Yes. Discussed returning to work and pumping. Reviewed importance of draining breast regularly to support lactation.  -Social determinants of health (SDOH) reviewed in EPIC. No concerns.  3. Sexuality, contraception and birth spacing - Patient does not want a pregnancy in the next year.  Desired family size is 4 children.  - Reviewed reproductive life  planning. Reviewed contraceptive methods based on pt preferences and effectiveness.  Patient desired IUD or IUS today.   - Discussed birth spacing of 18 months  4. Sleep and fatigue -Encouraged family/partner/community support of 4 hrs of uninterrupted sleep to help with mood and fatigue  5. Physical Recovery  - Discussed patients delivery and complications. She had a repeat LTCS, no complications. - Patient had a C-section repeat; no problems after deliver.  - Patient has urinary incontinence? No. - Patient is safe to resume physical and sexual activity  6.  Health Maintenance - HM due items addressed Yes - Last pap smear  Diagnosis  Date Value Ref Range Status  11/04/2020   Final   - Negative for intraepithelial lesion or malignancy (NILM)   Pap smear not done at today's visit.  -Breast Cancer screening indicated? No.   7. Chronic Disease/Pregnancy Condition follow up: Hypertension  - PCP follow up:  Referral made to IM  Coral Ceo, MD Center for [facility], [facility], [facility] 06/16/21

## 2021-07-14 ENCOUNTER — Ambulatory Visit: Payer: Medicaid Other

## 2022-02-02 ENCOUNTER — Ambulatory Visit: Payer: Medicaid Other | Admitting: Family Medicine

## 2022-03-29 IMAGING — US US MFM FETAL BPP W/O NON-STRESS
1 series · 15 of 28 positions shown · non-contrast
Comparison: none

[Series 1: us mfm fetal bpp w/o non-stress · 31 acquisitions, 15 frames shown]
[im 1/31]
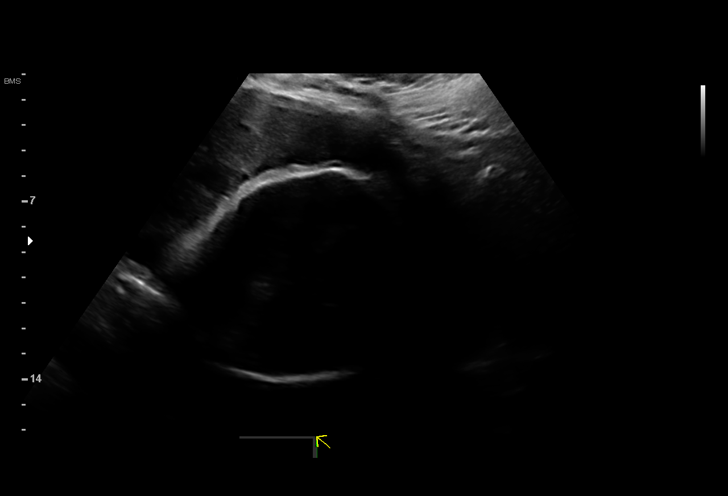
[im 3/31]
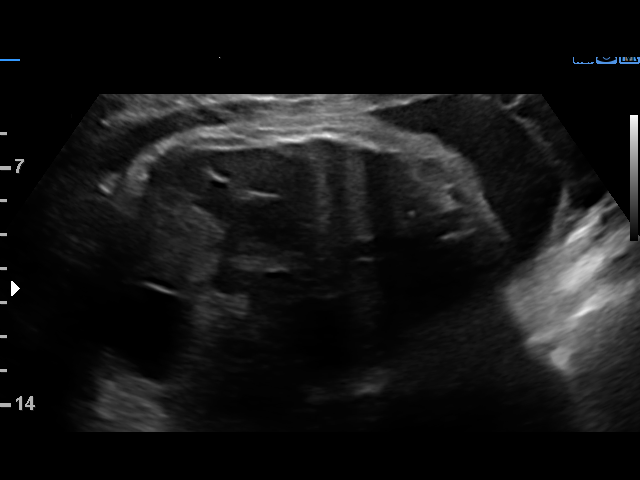
[im 5/31]
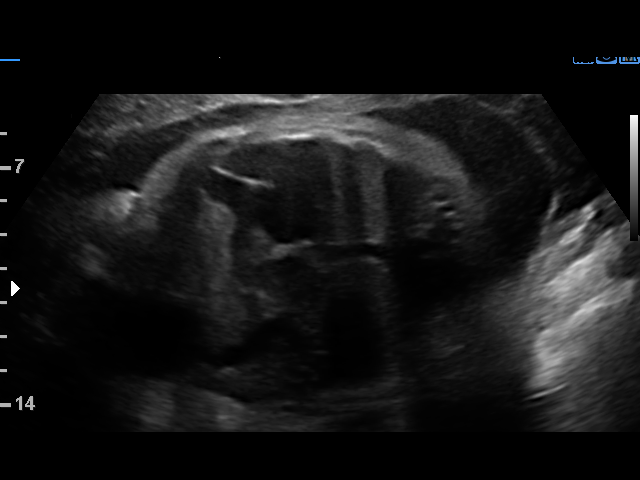
[im 7/31]
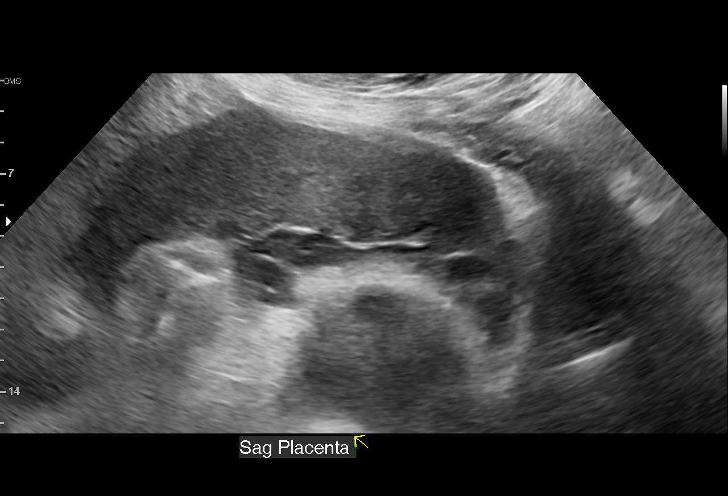
[im 9/31]
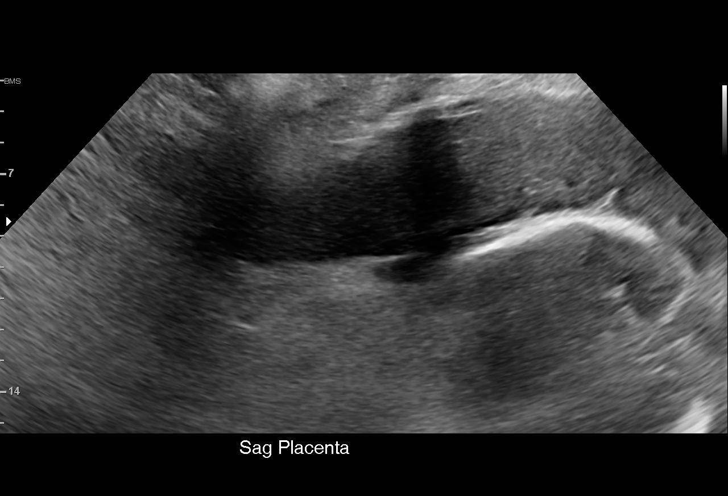
[im 12/31]
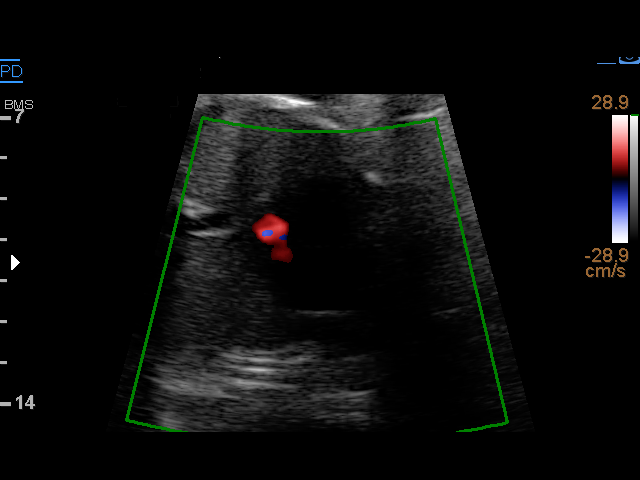
[im 14/31]
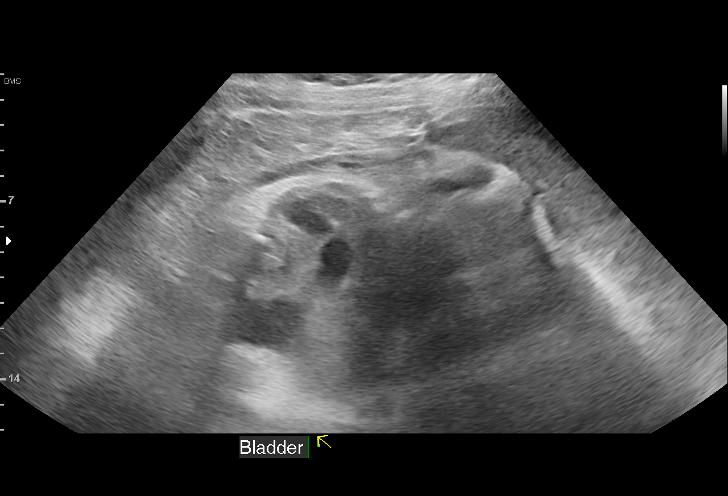
[im 16/31]
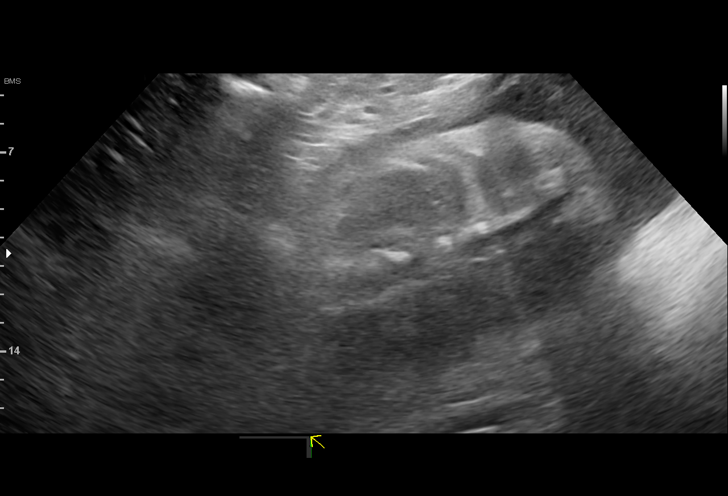
[im 17/31]
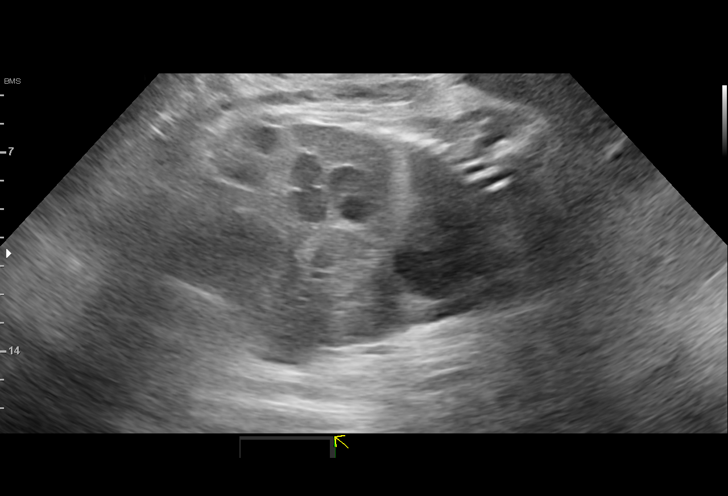
[im 19/31]
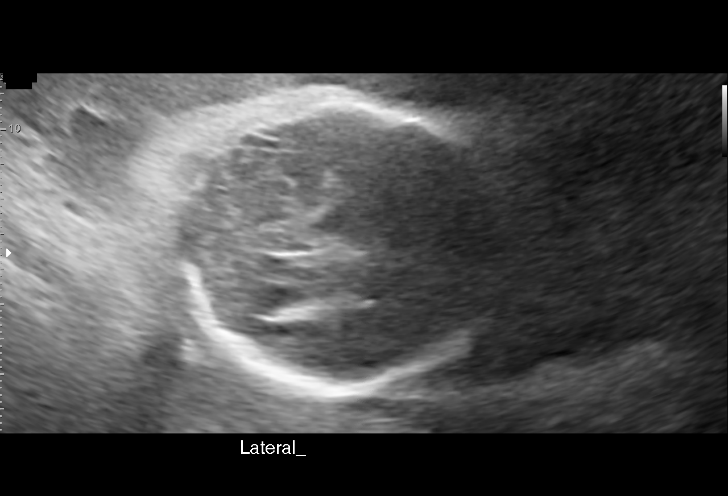
[im 22/31]
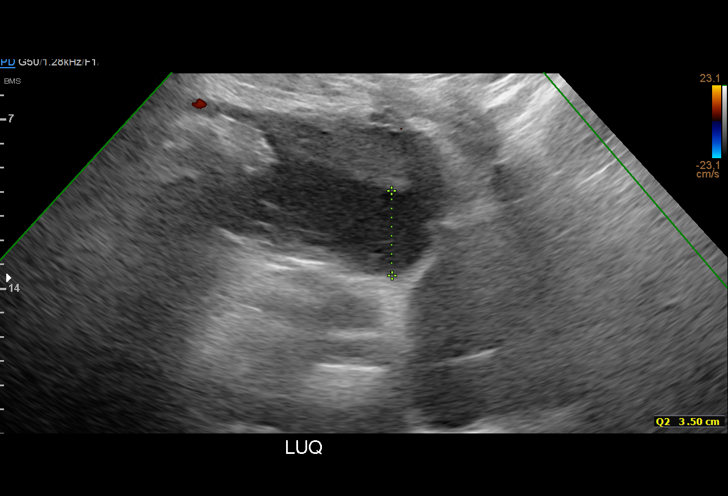
[im 24/31]
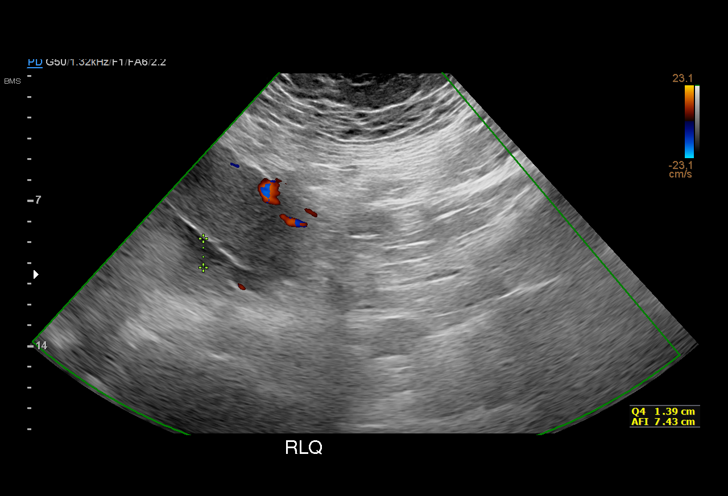
[im 26/31]
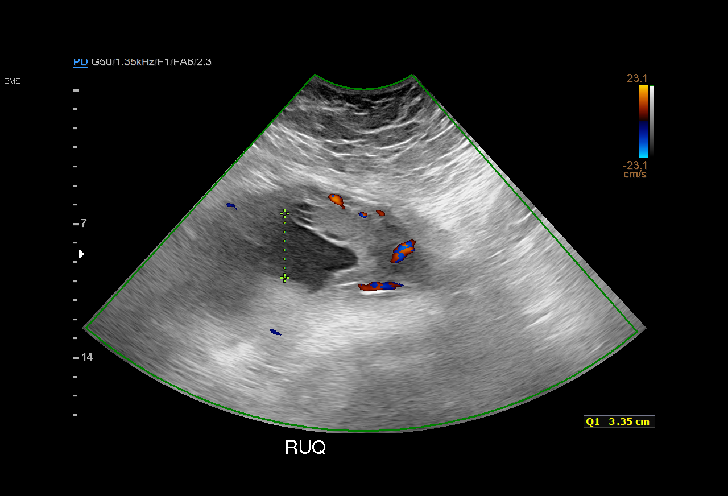
[im 28/31]
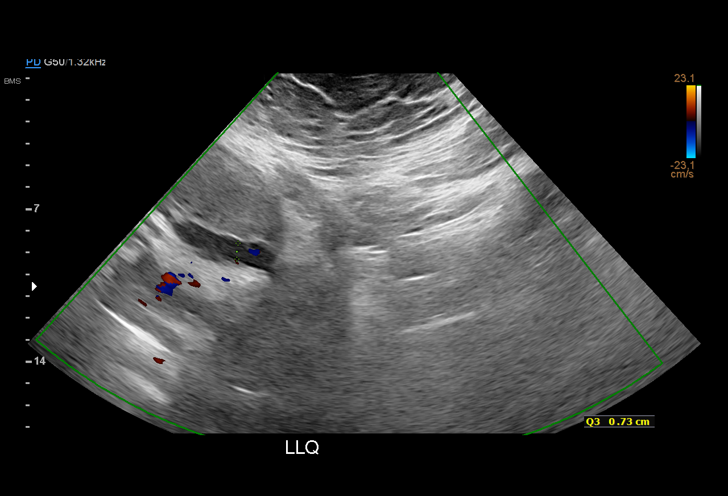
[im 31/31]
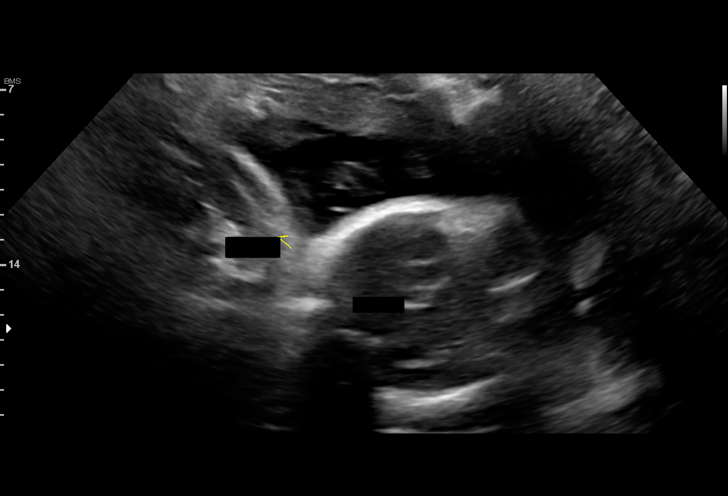

[15 of 28 positions shown; findings below may reference images not displayed]

Indications

 Hypertension - Chronic/Pre-existing
 (Labetalol,Procardia)
 2 vessel umbilical cord
 Gestational diabetes in pregnancy,
 unspecified control
 Obesity complicating pregnancy, second
 trimester (BMI 54)
 History of cesarean delivery, currently
 pregnant x 3
 Poor obstetric history: Previous preterm
 delivery, antepartum (34,36 weeks)
 Asthma                                         UTT.0T [id number]
 Poor obstetric history: Previous
 preeclampsia / eclampsia/gestational HTN
 Medical complication of pregnancy (Gant
 Jaylon Aujla to titer)
 32 weeks gestation of pregnancy
Fetal Evaluation

 Num Of Fetuses:         1
 Fetal Heart Rate(bpm):  162
 Cardiac Activity:       Observed
 Presentation:           Cephalic
 Placenta:               Anterior
 P. Cord Insertion:      Previously Visualized
 Amniotic Fluid
 AFI FV:      Within normal limits

 AFI Sum(cm)     %Tile       Largest Pocket(cm)
 8.87            7

 RUQ(cm)       RLQ(cm)       LUQ(cm)        LLQ(cm)

Biophysical Evaluation

 Amniotic F.V:   Within normal limits       F. Tone:        Observed
 F. Movement:    Observed                   Score:          [DATE]
 F. Breathing:   Observed
Biometry

 LV:        8.3  mm
OB History

 Gravidity:    8         Term:   1        Prem:   2        SAB:   0
 TOP:          0       Ectopic:  0        Living: 3
Gestational Age

 LMP:           33w 1d        Date:  08/10/20                 EDD:   05/17/21
 Best:          32w 0d     Det. By:  Early Ultrasound         EDD:   05/25/21
                                     (10/21/20)
Anatomy

 Ventricles:            Appears normal         Kidneys:                Appear normal
 Diaphragm:             Appears normal         Bladder:                Appears normal
 Stomach:               Appears normal, left
                        sided

 Other:  Technically difficult due to maternal habitus.
Impression

 Antenatal testing performed given maternal chronic
 hypertension, MOWQN, elevate BMI (>50) and C/S x3
 The biophysical profile was [DATE] with good fetal movement and
 amniotic fluid volume.

 Most recent prenatal visit  reported good blood sugar control.

 Blood pressure is normal 133/74 mmHg
Recommendations

 Continue weekly testing with your providers
 Growth scheduled at our clinic [DATE]
 She should obtain a BPP/NST her providers office the
 following week
 Delivery for repeat C/S scheduled at 37 weeks.

## 2022-05-10 ENCOUNTER — Ambulatory Visit: Payer: Medicaid Other | Admitting: Family Medicine

## 2022-07-05 ENCOUNTER — Ambulatory Visit: Payer: BC Managed Care – PPO | Admitting: Family Medicine

## 2022-07-05 ENCOUNTER — Encounter: Payer: Self-pay | Admitting: Family Medicine

## 2022-07-05 VITALS — BP 146/96 | HR 94 | Temp 97.5°F | Ht 61.0 in | Wt 304.0 lb

## 2022-07-05 DIAGNOSIS — B372 Candidiasis of skin and nail: Secondary | ICD-10-CM

## 2022-07-05 DIAGNOSIS — F5081 Binge eating disorder: Secondary | ICD-10-CM

## 2022-07-05 DIAGNOSIS — O4400 Placenta previa specified as without hemorrhage, unspecified trimester: Secondary | ICD-10-CM | POA: Insufficient documentation

## 2022-07-05 DIAGNOSIS — R809 Proteinuria, unspecified: Secondary | ICD-10-CM | POA: Diagnosis not present

## 2022-07-05 DIAGNOSIS — Z6841 Body Mass Index (BMI) 40.0 and over, adult: Secondary | ICD-10-CM

## 2022-07-05 DIAGNOSIS — Z7689 Persons encountering health services in other specified circumstances: Secondary | ICD-10-CM

## 2022-07-05 DIAGNOSIS — O10919 Unspecified pre-existing hypertension complicating pregnancy, unspecified trimester: Secondary | ICD-10-CM | POA: Diagnosis not present

## 2022-07-05 DIAGNOSIS — K439 Ventral hernia without obstruction or gangrene: Secondary | ICD-10-CM | POA: Insufficient documentation

## 2022-07-05 DIAGNOSIS — F50819 Binge eating disorder, unspecified: Secondary | ICD-10-CM

## 2022-07-05 DIAGNOSIS — Z8759 Personal history of other complications of pregnancy, childbirth and the puerperium: Secondary | ICD-10-CM

## 2022-07-05 DIAGNOSIS — I10 Essential (primary) hypertension: Secondary | ICD-10-CM | POA: Insufficient documentation

## 2022-07-05 DIAGNOSIS — Z3A00 Weeks of gestation of pregnancy not specified: Secondary | ICD-10-CM | POA: Diagnosis not present

## 2022-07-05 HISTORY — DX: Personal history of other complications of pregnancy, childbirth and the puerperium: Z87.59

## 2022-07-05 HISTORY — DX: Binge eating disorder: F50.81

## 2022-07-05 HISTORY — DX: Candidiasis of skin and nail: B37.2

## 2022-07-05 HISTORY — DX: Binge eating disorder, unspecified: F50.819

## 2022-07-05 LAB — COMPREHENSIVE METABOLIC PANEL
ALT: 9 U/L (ref 0–35)
AST: 9 U/L (ref 0–37)
Albumin: 4 g/dL (ref 3.5–5.2)
Alkaline Phosphatase: 74 U/L (ref 39–117)
BUN: 7 mg/dL (ref 6–23)
CO2: 22 mEq/L (ref 19–32)
Calcium: 9.2 mg/dL (ref 8.4–10.5)
Chloride: 101 mEq/L (ref 96–112)
Creatinine, Ser: 0.56 mg/dL (ref 0.40–1.20)
GFR: 121.64 mL/min (ref >60.00)
Glucose, Bld: 183 mg/dL — ABNORMAL HIGH (ref 70–99)
Potassium: 4 mEq/L (ref 3.5–5.1)
Sodium: 133 mEq/L — ABNORMAL LOW (ref 135–145)
Total Bilirubin: 0.3 mg/dL (ref 0.2–1.2)
Total Protein: 8.2 g/dL (ref 6.0–8.3)

## 2022-07-05 LAB — HEMOGLOBIN A1C: Hgb A1c MFr Bld: 6.4 % (ref 4.6–6.5)

## 2022-07-05 LAB — LIPID PANEL
Cholesterol: 127 mg/dL (ref 0–200)
HDL: 42.8 mg/dL (ref >39.00)
LDL Cholesterol: 48 mg/dL (ref 0–99)
NonHDL: 84.08
Total CHOL/HDL Ratio: 3
Triglycerides: 179 mg/dL — ABNORMAL HIGH (ref 0.0–149.0)
VLDL: 35.8 mg/dL (ref 0.0–40.0)

## 2022-07-05 LAB — POC URINALSYSI DIPSTICK (AUTOMATED)
Bilirubin, UA: NEGATIVE
Blood, UA: NEGATIVE
Glucose, UA: NEGATIVE
Ketones, UA: NEGATIVE
Leukocytes, UA: NEGATIVE
Nitrite, UA: NEGATIVE
Protein, UA: POSITIVE — AB
Spec Grav, UA: 1.02 (ref 1.010–1.025)
Urobilinogen, UA: 0.2 E.U./dL
pH, UA: 6 (ref 5.0–8.0)

## 2022-07-05 LAB — CBC WITH DIFFERENTIAL/PLATELET
Basophils Absolute: 0 10*3/uL (ref 0.0–0.1)
Basophils Relative: 0.2 % (ref 0.0–3.0)
Eosinophils Absolute: 0 10*3/uL (ref 0.0–0.7)
Eosinophils Relative: 0.1 % (ref 0.0–5.0)
HCT: 37.6 % (ref 36.0–46.0)
Hemoglobin: 12.2 g/dL (ref 12.0–15.0)
Lymphocytes Relative: 16.7 % (ref 12.0–46.0)
Lymphs Abs: 1.4 10*3/uL (ref 0.7–4.0)
MCHC: 32.3 g/dL (ref 30.0–36.0)
MCV: 76.5 fl — ABNORMAL LOW (ref 78.0–100.0)
Monocytes Absolute: 0.4 10*3/uL (ref 0.1–1.0)
Monocytes Relative: 4.5 % (ref 3.0–12.0)
Neutro Abs: 6.4 10*3/uL (ref 1.4–7.7)
Neutrophils Relative %: 78.5 % — ABNORMAL HIGH (ref 43.0–77.0)
Platelets: 354 10*3/uL (ref 150.0–400.0)
RBC: 4.91 Mil/uL (ref 3.87–5.11)
RDW: 16.9 % — ABNORMAL HIGH (ref 11.5–15.5)
WBC: 8.2 10*3/uL (ref 4.0–10.5)

## 2022-07-05 LAB — T4, FREE: Free T4: 0.68 ng/dL (ref 0.60–1.60)

## 2022-07-05 LAB — TSH: TSH: 1.61 u[IU]/mL (ref 0.35–5.50)

## 2022-07-05 MED ORDER — LABETALOL HCL 200 MG PO TABS: 200.0000 mg | ORAL_TABLET | Freq: Two times a day (BID) | ORAL | 1 refills | Status: DC

## 2022-07-05 NOTE — Progress Notes (Signed)
New Patient Office Visit  Subjective    Patient ID: Marilyn Cooper, female    DOB: 1990-09-27  Age: 32 y.o. MRN: [id number]  CC:  Chief Complaint  Patient presents with   Establish Care    Found out she is pregnant on Sunday, knows she has high BP but doesn't take medication for it. Hx of gestational diabetes but hasn't ruled out type 2, runs in family    HPI Marilyn Cooper presents to establish care  Currently pregnant with her 5th child. Married.   Her 69 year old is with her. She is not breastfeeding   States she was tracking her periods to prevent pregnancy.  LMP: 05/22/2022 Positive pregnancy test  OB/GYN- Dr. Clearance Coots at [facility]    HTN- she was prescribed Labetalol and Nifedipine XL at her postpartum visit on 05/2021 and she stopped these medications, states she ran out around Christmas time 2023.   States she was taking 200 mg bid and Nifedipine 60 mg daily.   States she checks her BP at home and it usually is 140s/90s.  Family history of HTN.   Denies feeling depressed for the past year.  Anxiety is manageable without medications.     Outpatient Encounter Medications as of 07/05/2022  Medication Sig   Ferrous Sulfate (IRON PO) Take by mouth.   labetalol (NORMODYNE) 200 MG tablet Take 1 tablet (200 mg total) by mouth 2 (two) times daily.   Prenatal Vit-Fe Fumarate-FA (PRENATAL VITAMIN PO) Take 1 tablet by mouth at bedtime.   [DISCONTINUED] enoxaparin (LOVENOX) 120 MG/0.8ML injection Inject 0.8 mLs (120 mg total) into the skin every 12 (twelve) hours. (Patient not taking: Reported on 07/05/2022)   [DISCONTINUED] ibuprofen (ADVIL) 800 MG tablet Take 1 tablet (800 mg total) by mouth every 8 (eight) hours as needed. (Patient not taking: Reported on 06/16/2021)   [DISCONTINUED] Iron Polysacch Cmplx-B12-FA 150-0.025-1 MG CAPS Take 1 capsule by mouth daily before breakfast.   [DISCONTINUED] labetalol (NORMODYNE) 200 MG tablet Take 2 tablets (400 mg total) by mouth 2 (two)  times daily. Or as directed by your doctor.   [DISCONTINUED] NIFEdipine (ADALAT CC) 30 MG 24 hr tablet Take 1 tablet (30 mg total) by mouth in the morning and at bedtime.   [DISCONTINUED] oxyCODONE (OXY IR/ROXICODONE) 5 MG immediate release tablet Take 1 tablet (5 mg total) by mouth every 6 (six) hours as needed for severe pain or breakthrough pain. (Patient not taking: Reported on 06/16/2021)   No facility-administered encounter medications on file as of 07/05/2022.    Past Medical History:  Diagnosis Date   Anemia    Anxiety    Asthma    childhood   Chronic hypertension    Depression    see social work consult, doing really well right now 'is in a good place" 3/29   Gestational diabetes    History of pre-eclampsia 07/05/2022   G1 39w, severe g2 @ w 36w delivery, ASA 81mg    Hx of varicella    Obese    Vaginal Pap smear, abnormal     Past Surgical History:  Procedure Laterality Date   BLADDER REPAIR N/A 08/29/2019   Procedure: BLADDER REPAIR;  Surgeon: Waynard Reeds, MD;  Location: [facility];  Service: Gynecology;  Laterality: N/A;   CESAREAN SECTION N/A 03/09/2012   Procedure: CESAREAN SECTION;  Surgeon: Freddrick March. Tenny Craw, MD;  Location: [facility];  Service: Obstetrics;  Laterality: N/A;   CESAREAN SECTION N/A 12/03/2014   Procedure: CESAREAN SECTION;  Surgeon: Marlow Baars, MD;  Location: [facility];  Service: Obstetrics;  Laterality: N/A;   CESAREAN SECTION N/A 08/29/2019   Procedure: CESAREAN SECTION;  Surgeon: Waynard Reeds, MD;  Location: [facility];  Service: Obstetrics;  Laterality: N/A;   CESAREAN SECTION N/A 05/04/2021   Procedure: CESAREAN SECTION;  Surgeon: Warden Fillers, MD;  Location: [facility];  Service: Obstetrics;  Laterality: N/A;   COLPOSCOPY     CYSTOSCOPY N/A 08/29/2019   Procedure: CYSTOSCOPY;  Surgeon: Waynard Reeds, MD;  Location: [facility];  Service: Gynecology;  Laterality: N/A;   HERNIA REPAIR     LAPAROTOMY N/A 08/29/2019   Procedure: EXPLORATORY LAPAROTOMY;   Surgeon: Waynard Reeds, MD;  Location: [facility];  Service: Gynecology;  Laterality: N/A;   TONSILLECTOMY     UMBILICAL HERNIA REPAIR N/A 06/21/2014   Procedure: EXPLORATORY LAPAROTOMY,  SMALL BOWEL RESECTION, PRIMARY REPAIR INCARCERATED VENTRAL HERNIA;  Surgeon: Darnell Level, MD;  Location: [facility]S;  Service: General;  Laterality: N/A;    Family History  Problem Relation Age of Onset   Anemia Mother    Hypertension Mother    Vision loss Father    Diabetes Father    Anemia Sister    Birth defects Brother        heart disorder?   ADD / ADHD Brother    Hypertension Maternal Grandmother    Diabetes Maternal Grandmother    Hypertension Maternal Grandfather    Diabetes Maternal Grandfather    Hypertension Paternal Grandmother    Diabetes Paternal Grandmother    Hypertension Paternal Grandfather    Diabetes Paternal Grandfather    Hypertension Maternal Aunt    Stomach cancer Neg Hx    Colon cancer Neg Hx    Esophageal cancer Neg Hx    Pancreatic disease Neg Hx     Social History   Socioeconomic History   Marital status: Married    Spouse name: Not on file   Number of children: Not on file   Years of education: Not on file   Highest education level: Not on file  Occupational History   Not on file  Tobacco Use   Smoking status: Never   Smokeless tobacco: Never  Vaping Use   Vaping Use: Never used  Substance and Sexual Activity   Alcohol use: No   Drug use: No   Sexual activity: Yes    Birth control/protection: Condom, None  Other Topics Concern   Not on file  Social History Narrative   Not on file   Social Determinants of Health   Financial Resource Strain: Not on file  Food Insecurity: Not on file  Transportation Needs: Not on file  Physical Activity: Not on file  Stress: Not on file  Social Connections: Not on file  Intimate Partner Violence: Not on file    Review of Systems  Constitutional:  Negative for chills, fever and malaise/fatigue.  Eyes:  Negative  for blurred vision and double vision.  Respiratory:  Negative for shortness of breath.   Cardiovascular:  Negative for chest pain, palpitations and leg swelling.  Gastrointestinal:  Negative for abdominal pain, constipation, diarrhea, nausea and vomiting.  Genitourinary:  Negative for dysuria, frequency and urgency.  Neurological:  Negative for dizziness, tingling, sensory change, focal weakness and headaches.  Endo/Heme/Allergies:  Negative for polydipsia.  Psychiatric/Behavioral:  Negative for depression. The patient is not nervous/anxious.         Objective    BP (!) 146/96 (BP Location: Left Arm,  Patient Position: Sitting, Cuff Size: Large)   Pulse 94   Temp (!) 97.5 F (36.4 C) (Temporal)   Ht 5\' 1"  (1.549 m)   Wt (!) 304 lb (137.9 kg)   SpO2 99%   Breastfeeding No   BMI 57.44 kg/m   Physical Exam Constitutional:      General: She is not in acute distress.    Appearance: She is obese. She is not ill-appearing.  Eyes:     Extraocular Movements: Extraocular movements intact.     Conjunctiva/sclera: Conjunctivae normal.  Cardiovascular:     Rate and Rhythm: Normal rate and regular rhythm.     Comments: EKG shows sinus tachycardia, rate 102, no Q waves or LVH Pulmonary:     Effort: Pulmonary effort is normal.     Breath sounds: Normal breath sounds.  Musculoskeletal:     Cervical back: Normal range of motion and neck supple.  Skin:    General: Skin is warm and dry.  Neurological:     General: No focal deficit present.     Mental Status: She is alert and oriented to person, place, and time.     Cranial Nerves: No cranial nerve deficit.     Motor: No weakness.     Gait: Gait normal.  Psychiatric:        Mood and Affect: Mood normal.        Behavior: Behavior normal.        Thought Content: Thought content normal.         Assessment & Plan:   Problem List Items Addressed This Visit       Cardiovascular and Mediastinum   Chronic hypertension affecting  pregnancy   Relevant Medications   labetalol (NORMODYNE) 200 MG tablet   Other Relevant Orders   EKG 12-Lead (Completed)   POCT Urinalysis Dipstick (Automated) (Completed)   Other Visit Diagnoses     Uncontrolled hypertension    -  Primary   Relevant Medications   labetalol (NORMODYNE) 200 MG tablet   Other Relevant Orders   CBC with Differential/Platelet (Completed)   Comprehensive metabolic panel (Completed)   TSH (Completed)   T4, free (Completed)   EKG 12-Lead (Completed)   POCT Urinalysis Dipstick (Automated) (Completed)   Encounter to establish care       Severe obesity (BMI >= 40) (HCC)       Relevant Orders   Hemoglobin A1c (Completed)   Lipid panel (Completed)   Proteinuria, unspecified type       Relevant Orders   Comprehensive metabolic panel (Completed)      She is here to establish care.  Pregnant with her 5th child. Is not currently breastfeeding.  Hx of high risk pregnancies with her last 2 pregnancies. She is aware that will be the case with the current pregnancy.  Will schedule with OB/GYN.  UA is +protein.  Check labs to look for complications related to uncontrolled HTN and obesity.  Restart labetalol 200 mg bid for now. Reduce sodium.  Monitor BP at home.  F/u in 4 weeks with readings.   Return in about 4 weeks (around 08/02/2022) for hypertension.   Hetty Blend, NP-C

## 2022-07-05 NOTE — Patient Instructions (Signed)
Restart Labetalol 200 mg twice daily. Monitor your blood pressures at home.   Reduce salt and sodium in your diet.   Call and schedule with your OB/GYN  DASH Eating Plan DASH stands for Dietary Approaches to Stop Hypertension. The DASH eating plan is a healthy eating plan that has been shown to: Lower high blood pressure (hypertension). Reduce your risk for type 2 diabetes, heart disease, and stroke. Help with weight loss. What are tips for following this plan? Reading food labels Check food labels for the amount of salt (sodium) per serving. Choose foods with less than 5 percent of the Daily Value (DV) of sodium. In general, foods with less than 300 milligrams (mg) of sodium per serving fit into this eating plan. To find whole grains, look for the word "whole" as the first word in the ingredient list. Shopping Buy products labeled as "low-sodium" or "no salt added." Buy fresh foods. Avoid canned foods and pre-made or frozen meals. Cooking Try not to add salt when you cook. Use salt-free seasonings or herbs instead of table salt or sea salt. Check with your health care provider or pharmacist before using salt substitutes. Do not fry foods. Cook foods in healthy ways, such as baking, boiling, grilling, roasting, or broiling. Cook using oils that are good for your heart. These include olive, canola, avocado, soybean, and sunflower oil. Meal planning  Eat a balanced diet. This should include: 4 or more servings of fruits and 4 or more servings of vegetables each day. Try to fill half of your plate with fruits and vegetables. 6-8 servings of whole grains each day. 6 or less servings of lean meat, poultry, or fish each day. 1 oz is 1 serving. A 3 oz (85 g) serving of meat is about the same size as the palm of your hand. One egg is 1 oz (28 g). 2-3 servings of low-fat dairy each day. One serving is 1 cup (237 mL). 1 serving of nuts, seeds, or beans 5 times each week. 2-3 servings of  heart-healthy fats. Healthy fats called omega-3 fatty acids are found in foods such as walnuts, flaxseeds, fortified milks, and eggs. These fats are also found in cold-water fish, such as sardines, salmon, and mackerel. Limit how much you eat of: Canned or prepackaged foods. Food that is high in trans fat, such as fried foods. Food that is high in saturated fat, such as fatty meat. Desserts and other sweets, sugary drinks, and other foods with added sugar. Full-fat dairy products. Do not salt foods before eating. Do not eat more than 4 egg yolks a week. Try to eat at least 2 vegetarian meals a week. Eat more home-cooked food and less restaurant, buffet, and fast food. Lifestyle When eating at a restaurant, ask if your food can be made with less salt or no salt. If you drink alcohol: Limit how much you have to: 0-1 drink a day if you are female. 0-2 drinks a day if you are female. Know how much alcohol is in your drink. In the [place] one drink is one 12 oz bottle of beer (355 mL), one 5 oz glass of wine (148 mL), or one 1 oz glass of hard liquor (44 mL). General information Avoid eating more than 2,300 mg of salt a day. If you have hypertension, you may need to reduce your sodium intake to 1,500 mg a day. Work with your provider to stay at a healthy body weight or lose weight. Ask what the best weight  range is for you. On most days of the week, get at least 30 minutes of exercise that causes your heart to beat faster. This may include walking, swimming, or biking. Work with your provider or dietitian to adjust your eating plan to meet your specific calorie needs. What foods should I eat? Fruits All fresh, dried, or frozen fruit. Canned fruits that are in their natural juice and do not have sugar added to them. Vegetables Fresh or frozen vegetables that are raw, steamed, roasted, or grilled. Low-sodium or reduced-sodium tomato and vegetable juice. Low-sodium or reduced-sodium tomato sauce and  tomato paste. Low-sodium or reduced-sodium canned vegetables. Grains Whole-grain or whole-wheat bread. Whole-grain or whole-wheat pasta. Brown rice. Orpah Cobb. Bulgur. Whole-grain and low-sodium cereals. Pita bread. Low-fat, low-sodium crackers. Whole-wheat flour tortillas. Meats and other proteins Skinless chicken or [place]. Ground chicken or [place]. Pork with fat trimmed off. Fish and seafood. Egg whites. Dried beans, peas, or lentils. Unsalted nuts, nut butters, and seeds. Unsalted canned beans. Lean cuts of beef with fat trimmed off. Low-sodium, lean precooked or cured meat, such as sausages or meat loaves. Dairy Low-fat (1%) or fat-free (skim) milk. Reduced-fat, low-fat, or fat-free cheeses. Nonfat, low-sodium ricotta or cottage cheese. Low-fat or nonfat yogurt. Low-fat, low-sodium cheese. Fats and oils Soft margarine without trans fats. Vegetable oil. Reduced-fat, low-fat, or light mayonnaise and salad dressings (reduced-sodium). Canola, safflower, olive, avocado, soybean, and sunflower oils. Avocado. Seasonings and condiments Herbs. Spices. Seasoning mixes without salt. Other foods Unsalted popcorn and pretzels. Fat-free sweets. The items listed above may not be all the foods and drinks you can have. Talk to a dietitian to learn more. What foods should I avoid? Fruits Canned fruit in a light or heavy syrup. Fried fruit. Fruit in cream or butter sauce. Vegetables Creamed or fried vegetables. Vegetables in a cheese sauce. Regular canned vegetables that are not marked as low-sodium or reduced-sodium. Regular canned tomato sauce and paste that are not marked as low-sodium or reduced-sodium. Regular tomato and vegetable juices that are not marked as low-sodium or reduced-sodium. Rosita Fire. Olives. Grains Baked goods made with fat, such as croissants, muffins, or some breads. Dry pasta or rice meal packs. Meats and other proteins Fatty cuts of meat. Ribs. Fried meat. Tomasa Blase. Bologna,  salami, and other precooked or cured meats, such as sausages or meat loaves, that are not lean and low in sodium. Fat from the back of a pig (fatback). Bratwurst. Salted nuts and seeds. Canned beans with added salt. Canned or smoked fish. Whole eggs or egg yolks. Chicken or [place] with skin. Dairy Whole or 2% milk, cream, and half-and-half. Whole or full-fat cream cheese. Whole-fat or sweetened yogurt. Full-fat cheese. Nondairy creamers. Whipped toppings. Processed cheese and cheese spreads. Fats and oils Butter. Stick margarine. Lard. Shortening. Ghee. Bacon fat. Tropical oils, such as coconut, palm kernel, or palm oil. Seasonings and condiments Onion salt, garlic salt, seasoned salt, table salt, and sea salt. Worcestershire sauce. Tartar sauce. Barbecue sauce. Teriyaki sauce. Soy sauce, including reduced-sodium soy sauce. Steak sauce. Canned and packaged gravies. Fish sauce. Oyster sauce. Cocktail sauce. Store-bought horseradish. Ketchup. Mustard. Meat flavorings and tenderizers. Bouillon cubes. Hot sauces. Pre-made or packaged marinades. Pre-made or packaged taco seasonings. Relishes. Regular salad dressings. Other foods Salted popcorn and pretzels. The items listed above may not be all the foods and drinks you should avoid. Talk to a dietitian to learn more. Where to find more information National Heart, Lung, and Blood Institute (NHLBI): [place]DryCleaner.gl American Heart Association (AHA):  ThisJobs.cz Academy of Nutrition and Dietetics: eatright.org National Kidney Foundation (NKF): kidney.org This information is not intended to replace advice given to you by your health care provider. Make sure you discuss any questions you have with your health care provider. Document Revised: 01/20/2022 Document Reviewed: 01/20/2022 Elsevier Patient Education  2024 [employer].

## 2022-07-28 ENCOUNTER — Other Ambulatory Visit: Payer: Self-pay | Admitting: Family Medicine

## 2022-07-28 DIAGNOSIS — I10 Essential (primary) hypertension: Secondary | ICD-10-CM

## 2022-07-29 ENCOUNTER — Ambulatory Visit: Payer: Medicaid Other | Admitting: Family Medicine

## 2022-08-22 ENCOUNTER — Other Ambulatory Visit: Payer: Self-pay | Admitting: *Deleted

## 2022-08-22 ENCOUNTER — Encounter: Payer: Self-pay | Admitting: *Deleted

## 2022-08-23 ENCOUNTER — Other Ambulatory Visit (HOSPITAL_COMMUNITY)
Admission: RE | Admit: 2022-08-23 | Discharge: 2022-08-23 | Disposition: A | Discharge status: Home / Self Care | Payer: BC Managed Care – PPO | Source: Ambulatory Visit | Attending: Obstetrics and Gynecology | Admitting: Obstetrics and Gynecology

## 2022-08-23 ENCOUNTER — Other Ambulatory Visit (INDEPENDENT_AMBULATORY_CARE_PROVIDER_SITE_OTHER): Payer: BC Managed Care – PPO

## 2022-08-23 ENCOUNTER — Ambulatory Visit (INDEPENDENT_AMBULATORY_CARE_PROVIDER_SITE_OTHER): Payer: Medicaid Other | Admitting: *Deleted

## 2022-08-23 VITALS — BP 175/101 | HR 94 | Wt 308.3 lb

## 2022-08-23 DIAGNOSIS — Z3A13 13 weeks gestation of pregnancy: Secondary | ICD-10-CM | POA: Diagnosis not present

## 2022-08-23 DIAGNOSIS — O10919 Unspecified pre-existing hypertension complicating pregnancy, unspecified trimester: Secondary | ICD-10-CM

## 2022-08-23 DIAGNOSIS — O0990 Supervision of high risk pregnancy, unspecified, unspecified trimester: Secondary | ICD-10-CM | POA: Insufficient documentation

## 2022-08-23 DIAGNOSIS — R768 Other specified abnormal immunological findings in serum: Secondary | ICD-10-CM

## 2022-08-23 DIAGNOSIS — O0991 Supervision of high risk pregnancy, unspecified, first trimester: Secondary | ICD-10-CM

## 2022-08-23 DIAGNOSIS — Z1339 Encounter for screening examination for other mental health and behavioral disorders: Secondary | ICD-10-CM

## 2022-08-23 DIAGNOSIS — O3680X0 Pregnancy with inconclusive fetal viability, not applicable or unspecified: Secondary | ICD-10-CM

## 2022-08-23 MED ORDER — LABETALOL HCL 200 MG PO TABS: 400.0000 mg | ORAL_TABLET | Freq: Three times a day (TID) | ORAL | 1 refills | Status: DC

## 2022-08-23 NOTE — Patient Instructions (Signed)
[facility] has a partnership with the [employer] to provide prenatal navigation for the most needed resources in our community. In order to see how we can help connect you to these resources we need consent to contact you. Please complete the very short consent using the link below:   English Link: https://[place].tfaforms.net/283?site=16  Spanish Link: https://[place].tfaforms.net/287?site=16   Options for Doula Care in the [place] Area  As you review your birthing options, consider having a birth doula. A doula is trained to provide support before, during and just after you give birth. There are also postpartum doulas that help you adjust to new parenthood.  While doulas do not provide medical care, they do provide emotional, physical and educational support. A few months before your baby arrives, doulas can help answer questions, ease concerns and help you create and support your birthing plan.    Doulas can help reduce your stress and comfort you and your partner. They can help you cope with labor by helping you use breathing techniques, massage, creative labor positioning, essential oils and affirmations.   Studies show that the benefits of having a doula include:   A more positive birth experience  Fewer requests for pain-relief medication  Less likelihood of cesarean section, commonly called a c-section   Doulas are typically hired via a fee and contract between you and the doula. We are happy to provide a list of the most active doulas in the area, all of whom are credentialed by [facility] and will not count as a visitor at your birth.  There are several options for no-cost doula care at our hospital, including:  [facility] Volunteer Doula Program Every Baby [place] Doula Program A Cure 4 Moms Doula Study (available only at [facility], [facility], [place] and [place] [facility] offices)  For more information on these programs or to receive  a list of doulas active in our area, please email [email or web address]         

## 2022-08-23 NOTE — Progress Notes (Signed)
Severe range BP's X 3 readings. Reviewed with Dr. Berton Lan. Orders to increase Labetalol to 400 mg TID. Pt advised and new RX sent. Pt to begin new dose tonight. Pt to check BP at home BID and report high readings. Pt will follow Babyscripts sign up link and begin recording BP's. Pt to return for BP check in 1-2 weeks.

## 2022-08-23 NOTE — Progress Notes (Signed)
New OB Intake  I connected with Marilyn Cooper  on 08/23/22 at  2:10 PM EDT by In Person Visit and verified that I am speaking with the correct person using two identifiers. Nurse is located at [facility] and pt is located at [place].  I discussed the limitations, risks, security and privacy concerns of performing an evaluation and management service by telephone and the availability of in person appointments. I also discussed with the patient that there may be a patient responsible charge related to this service. The patient expressed understanding and agreed to proceed.  I explained I am completing New OB Intake today. We discussed EDD of 02/26/2023, by Last Menstrual Period. Pt is [id number]. I reviewed her allergies, medications and Medical/Surgical/OB history.    Patient Active Problem List   Diagnosis Date Noted   Binge eating disorder 07/05/2022   Candidiasis of skin 07/05/2022   Benign essential hypertension 07/05/2022   Hernia of anterior abdominal wall 07/05/2022   Superficial thrombophlebitis of left upper extremity 06/02/2021   History of 3 cesarean sections 05/04/2021   Status post repeat low transverse cesarean section 05/04/2021   IUD (intrauterine device) in place 05/04/2021   Diabetes mellitus affecting pregnancy in second trimester 12/09/2020   Maternal obesity affecting pregnancy, antepartum 12/01/2020   Previous cesarean delivery affecting pregnancy 12/01/2020   Major depressive disorder with single episode, 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 Discussed the nature of our practice with multiple providers including  residents and students. Due to the size of the practice, the delivering provider may not be the same as those providing prenatal care.   Patient is not a candidate for water birth. Offered upcoming OB visit with CNM to discuss further.  MyChart/Babyscripts MyChart access verified. I explained pt will have some visits in office and some virtually. Babyscripts instructions given and order placed. Patient verifies receipt of registration text/e-mail. Account successfully created and app downloaded.  Blood Pressure Cuff/Weight Scale Patient has a BP cuff at home. Explained after first prenatal appt pt will check weekly and document in Babyscripts. Patient does not have weight scale; patient may purchase if they desire to track weight weekly in Babyscripts.  Anatomy US Explained first scheduled [place] will be around 19 weeks. Anatomy US scheduled for TBD at TBD.  Interested in [place]? If yes, send referral and doula dot phrase.   Is patient a candidate for Babyscripts Optimization? No - High risk pregnancy  First visit review I reviewed new OB appt with patient. Explained pt will be seen by Sharen Counter, CNM at first visit. Discussed Avelina Laine genetic screening with patient. Declined Panorama and Horizon.. Routine prenatal labs  collected at today's visit.    Last Pap Diagnosis  Date Value Ref Range Status  11/04/2020   Final   - Negative for intraepithelial lesion or malignancy (NILM)    Harrel Lemon, RN 08/23/2022  2:38 PM

## 2022-08-24 ENCOUNTER — Ambulatory Visit: Payer: Medicaid Other | Admitting: Family Medicine

## 2022-08-28 ENCOUNTER — Encounter: Payer: Self-pay | Admitting: Obstetrics and Gynecology

## 2022-08-28 ENCOUNTER — Other Ambulatory Visit: Payer: Self-pay | Admitting: Obstetrics and Gynecology

## 2022-08-28 DIAGNOSIS — E119 Type 2 diabetes mellitus without complications: Secondary | ICD-10-CM | POA: Insufficient documentation

## 2022-08-28 DIAGNOSIS — O24912 Unspecified diabetes mellitus in pregnancy, second trimester: Secondary | ICD-10-CM

## 2022-08-30 ENCOUNTER — Other Ambulatory Visit: Payer: Self-pay

## 2022-08-30 ENCOUNTER — Telehealth: Payer: Self-pay

## 2022-08-30 DIAGNOSIS — O24912 Unspecified diabetes mellitus in pregnancy, second trimester: Secondary | ICD-10-CM

## 2022-08-30 MED ORDER — ACCU-CHEK GUIDE VI STRP
ORAL_STRIP | 12 refills | Status: DC
Start: 2022-08-30 — End: 2023-02-25

## 2022-08-30 MED ORDER — ACCU-CHEK SOFTCLIX LANCETS MISC
100.0000 | Freq: Four times a day (QID) | 12 refills | Status: DC
Start: 2022-08-30 — End: 2023-02-16

## 2022-08-30 MED ORDER — ACCU-CHEK GUIDE W/DEVICE KIT: 1.0000 | PACK | Freq: Every day | 0 refills | Status: DC

## 2022-08-30 NOTE — Telephone Encounter (Signed)
-----   Message from Lennart Pall sent at 08/28/2022  4:01 PM EDT ----- Regarding: Schedule 2h GTT Pt 's early A1c is consistent with type 2 diabetes. Can we please coordinate diabetes educator appointment so she can start learning how to monitor BG? Thanks - KF

## 2022-08-30 NOTE — Telephone Encounter (Signed)
Patient informed. Supplies sent to pharmacy. Patient aware of process due to diagnosis in previous pregnancies.

## 2022-08-31 NOTE — Addendum Note (Signed)
Addended by: Harvie Bridge on: 08/31/2022 06:33 PM   Modules accepted: Orders

## 2022-09-05 ENCOUNTER — Encounter: Payer: Self-pay | Admitting: Advanced Practice Midwife

## 2022-09-05 ENCOUNTER — Ambulatory Visit (INDEPENDENT_AMBULATORY_CARE_PROVIDER_SITE_OTHER): Payer: BC Managed Care – PPO | Admitting: Advanced Practice Midwife

## 2022-09-05 VITALS — BP 143/88 | HR 93 | Wt 309.4 lb

## 2022-09-05 DIAGNOSIS — Z98891 History of uterine scar from previous surgery: Secondary | ICD-10-CM

## 2022-09-05 DIAGNOSIS — O10912 Unspecified pre-existing hypertension complicating pregnancy, second trimester: Secondary | ICD-10-CM

## 2022-09-05 DIAGNOSIS — O0992 Supervision of high risk pregnancy, unspecified, second trimester: Secondary | ICD-10-CM | POA: Diagnosis not present

## 2022-09-05 DIAGNOSIS — Z3A15 15 weeks gestation of pregnancy: Secondary | ICD-10-CM

## 2022-09-05 DIAGNOSIS — O24112 Pre-existing diabetes mellitus, type 2, in pregnancy, second trimester: Secondary | ICD-10-CM

## 2022-09-05 DIAGNOSIS — O24111 Pre-existing diabetes mellitus, type 2, in pregnancy, first trimester: Secondary | ICD-10-CM

## 2022-09-05 DIAGNOSIS — O10919 Unspecified pre-existing hypertension complicating pregnancy, unspecified trimester: Secondary | ICD-10-CM

## 2022-09-05 DIAGNOSIS — Z6841 Body Mass Index (BMI) 40.0 and over, adult: Secondary | ICD-10-CM

## 2022-09-05 DIAGNOSIS — O0990 Supervision of high risk pregnancy, unspecified, unspecified trimester: Secondary | ICD-10-CM

## 2022-09-05 MED ORDER — METFORMIN HCL 500 MG PO TABS: ORAL_TABLET | ORAL | 5 refills | Status: DC

## 2022-09-05 NOTE — Progress Notes (Unsigned)
Pt presents for ROB visit. Pt declines gentics and AFP testing.

## 2022-09-05 NOTE — Progress Notes (Unsigned)
Subjective:   Marilyn Cooper is a 32 y.o. [id number] at [redacted]w[redacted]d by LMP being seen today for her first obstetrical visit.  Her obstetrical history is significant for  CHTN, T2DM, BMI 57, cesarean x 4   and has Generalized anxiety disorder; Incarcerated umbilical hernia; Panic disorder; Depression; Chronic hypertension affecting pregnancy; Depressed mood; Major depressive disorder with single episode, in partial remission (HCC); Maternal obesity affecting pregnancy, antepartum; Previous cesarean delivery affecting pregnancy; Diabetes mellitus affecting pregnancy in second trimester; History of 3 cesarean sections; Status post repeat low transverse cesarean section; IUD (intrauterine device) in place; Superficial thrombophlebitis of left upper extremity; Binge eating disorder; Candidiasis of skin; Benign essential hypertension; Hernia of anterior abdominal wall; Supervision of high risk pregnancy, antepartum; and Type 2 diabetes mellitus (HCC) on their problem list..   Patient reports {[id number]}.  HISTORY: OB History  Gravida Para Term Preterm AB Living  9 4 2 2 4 4   SAB IAB Ectopic Multiple Live Births  1 3 0 0 4    # Outcome Date GA Lbr Len/2nd Weight Sex Type Anes PTL Lv  9 Current           8 Term 05/04/21 [redacted]w[redacted]d  7 lb 0.5 oz (3.19 kg) F CS-LTranv Spinal  LIV     Name: Marilyn Cooper     Apgar1: 8  Apgar5: 9  7 Preterm 08/29/19 [redacted]w[redacted]d  6 lb 4.5 oz (2.85 kg) M CS-LTranv Spinal  LIV     Birth Comments: preterm     Name: Marilyn Cooper     Apgar1: 8  Apgar5: 8  6 Preterm 12/03/14 [redacted]w[redacted]d  7 lb 15.7 oz (3.62 kg) F CS-LTranv EPI, Spinal  LIV     Birth Comments: Preeclampsia, mother treated with mag after birth     Name: Marilyn Cooper     Apgar1: 8  Apgar5: 9  5 Term 03/09/12 [redacted]w[redacted]d   M CS-LTranv EPI  LIV     Name: Marilyn Cooper     Apgar1: 2  Apgar5: 8  4 IAB 2013          3 IAB 2012          2 IAB 2011          1 SAB            Past Medical History:  Diagnosis Date    Anemia    Anxiety    Asthma    childhood   Chronic hypertension    Depression    see social work consult, doing really well right now 'is in a good place" 3/29   Gestational diabetes    History of pre-eclampsia 07/05/2022   G1 39w, severe g2 @ w 36w delivery, ASA 81mg    Hx of varicella    Obese    Vaginal Pap smear, abnormal    Past Surgical History:  Procedure Laterality Date   BLADDER REPAIR N/A 08/29/2019   Procedure: BLADDER REPAIR;  Surgeon: Waynard Reeds, MD;  Location: [facility];  Service: Gynecology;  Laterality: N/A;   CESAREAN SECTION N/A 03/09/2012   Procedure: CESAREAN SECTION;  Surgeon: Freddrick March. Tenny Craw, MD;  Location: [facility];  Service: Obstetrics;  Laterality: N/A;   CESAREAN SECTION N/A 12/03/2014   Procedure: CESAREAN SECTION;  Surgeon: Marlow Baars, MD;  Location: [facility];  Service: Obstetrics;  Laterality: N/A;   CESAREAN SECTION N/A 08/29/2019   Procedure: CESAREAN SECTION;  Surgeon: Waynard Reeds, MD;  Location: [facility];  Service: Obstetrics;  Laterality: N/A;   CESAREAN SECTION N/A 05/04/2021   Procedure: CESAREAN SECTION;  Surgeon: Warden Fillers, MD;  Location: [facility];  Service: Obstetrics;  Laterality: N/A;   COLPOSCOPY     CYSTOSCOPY N/A 08/29/2019   Procedure: CYSTOSCOPY;  Surgeon: Waynard Reeds, MD;  Location: [facility];  Service: Gynecology;  Laterality: N/A;   LAPAROTOMY N/A 08/29/2019   Procedure: EXPLORATORY LAPAROTOMY;  Surgeon: Waynard Reeds, MD;  Location: [facility];  Service: Gynecology;  Laterality: N/A;   TONSILLECTOMY     UMBILICAL HERNIA REPAIR N/A 06/21/2014   Procedure: EXPLORATORY LAPAROTOMY,  SMALL BOWEL RESECTION, PRIMARY REPAIR INCARCERATED VENTRAL HERNIA;  Surgeon: Darnell Level, MD;  Location: [facility];  Service: General;  Laterality: N/A;   Family History  Problem Relation Age of Onset   Anemia Mother    Hypertension Mother    Vision loss Father    Diabetes Father    Anemia Sister    Birth defects Brother        heart disorder?    ADD / ADHD Brother    Hypertension Maternal Grandmother    Diabetes Maternal Grandmother    Hypertension Maternal Grandfather    Diabetes Maternal Grandfather    Hypertension Paternal Grandmother    Diabetes Paternal Grandmother    Hypertension Paternal Grandfather    Diabetes Paternal Grandfather    Hypertension Maternal Aunt    Stomach cancer Neg Hx    Colon cancer Neg Hx    Esophageal cancer Neg Hx    Pancreatic disease Neg Hx    Social History   Tobacco Use   Smoking status: Never   Smokeless tobacco: Never  Vaping Use   Vaping status: Never Used  Substance Use Topics   Alcohol use: No   Drug use: No   No Known Allergies Current Outpatient Medications on File Prior to Visit  Medication Sig Dispense Refill   Accu-Chek Softclix Lancets lancets 100 each by Other route 4 (four) times daily. 100 each 12   Blood Glucose Monitoring Suppl (ACCU-CHEK GUIDE) w/Device KIT 1 kit by Does not apply route daily. 1 kit 0   Ferrous Sulfate (IRON PO) Take by mouth.     glucose blood (ACCU-CHEK GUIDE) test strip Four times daily 100 each 12   labetalol (NORMODYNE) 200 MG tablet TAKE 1 TABLET BY MOUTH TWICE A DAY 180 tablet 0   labetalol (NORMODYNE) 200 MG tablet Take 2 tablets (400 mg total) by mouth 3 (three) times daily. 180 tablet 1   Prenatal Vit-Fe Fumarate-FA (PRENATAL VITAMIN PO) Take 1 tablet by mouth at bedtime.     No current facility-administered medications on file prior to visit.    *** Indications for ASA therapy (per uptodate) One of the following: Previous pregnancy with preeclampsia, especially early onset and with an adverse outcome {yes/no:20286} Multifetal gestation {yes/no:20286} Chronic hypertension {yes/no:20286} Type 1 or 2 diabetes mellitus {yes/no:20286} Chronic kidney disease {yes/no:20286} Autoimmune disease (antiphospholipid syndrome, systemic lupus erythematosus) {yes/no:20286}  *** Two or more of the following: Nulliparity {yes/no:20286} Obesity  (body mass index >30 kg/m2) {yes/no:20286} Family history of preeclampsia in mother or sister {yes/no:20286} Age ?35 years {yes/no:20286} Sociodemographic characteristics (African American race, low socioeconomic level) {yes/no:20286} Personal risk factors (eg, previous pregnancy with low birth weight or small for gestational age infant, previous adverse pregnancy outcome [eg, stillbirth], interval >10 years between pregnancies) {yes/no:20286}  *** Indications for early 1 hour GTT (per uptodate)  BMI >25 (>23 in Asian women) AND one  of the following  Gestational diabetes mellitus in a previous pregnancy {yes/no:20286} Glycated hemoglobin ?5.7 percent (39 mmol/mol), impaired glucose tolerance, or impaired fasting glucose on previous testing {yes/no:20286} First-degree relative with diabetes {yes/no:20286} High-risk race/ethnicity (eg, African American, Latino, Native American, [place] American, [place] Islander) {yes/no:20286} History of cardiovascular disease {yes/no:20286} Hypertension or on therapy for hypertension {yes/no:20286} High-density lipoprotein cholesterol level <35 mg/dL (1.61 mmol/L) and/or a triglyceride level >250 mg/dL (0.96 mmol/L) {EAV/WU:[id number]} Polycystic ovary syndrome {yes/no:20286} Physical inactivity {yes/no:20286} Other clinical condition associated with insulin resistance (eg, severe obesity, acanthosis nigricans) {yes/no:20286} Previous birth of an infant weighing ?4000 g {yes/no:20286} Previous stillbirth of unknown cause {yes/no:20286} Exam   Vitals:   09/05/22 1344  BP: (!) 143/88  Pulse: 93  Weight: (!) 309 lb 6.4 oz (140.3 kg)      Uterus:     Pelvic Exam: Perineum: no hemorrhoids, normal perineum   Vulva: normal external genitalia, no lesions   Vagina:  normal mucosa, normal discharge   Cervix: no lesions and normal, pap smear done.    Adnexa: normal adnexa and no mass, fullness, tenderness   Bony Pelvis: average  System: General: well-developed,  well-nourished female in no acute distress   Breast:  normal appearance, no masses or tenderness   Skin: normal coloration and turgor, no rashes   Neurologic: oriented, normal, negative, normal mood   Extremities: normal strength, tone, and muscle mass, ROM of all joints is normal   HEENT PERRLA, extraocular movement intact and sclera clear, anicteric   Mouth/Teeth mucous membranes moist, pharynx normal without lesions and dental hygiene good   Neck supple and no masses   Cardiovascular: regular rate and rhythm   Respiratory:  no respiratory distress, normal breath sounds   Abdomen: soft, non-tender; bowel sounds normal; no masses,  no organomegaly     Assessment:   Pregnancy: [id number] Patient Active Problem List   Diagnosis Date Noted   Type 2 diabetes mellitus (HCC) 08/28/2022   Supervision of high risk pregnancy, antepartum 08/23/2022   Binge eating disorder 07/05/2022   Candidiasis of skin 07/05/2022   Benign essential hypertension 07/05/2022   Hernia of anterior abdominal wall 07/05/2022   Superficial thrombophlebitis of left upper extremity 06/02/2021   History of 3 cesarean sections 05/04/2021   Status post repeat low transverse cesarean section 05/04/2021   IUD (intrauterine device) in place 05/04/2021   Diabetes mellitus affecting pregnancy in second trimester 12/09/2020   Maternal obesity affecting pregnancy, antepartum 12/01/2020   Previous cesarean delivery affecting pregnancy 12/01/2020   Major depressive disorder with single episode, in partial remission (HCC) 11/30/2020   Depressed mood 04/29/2020   Chronic hypertension affecting pregnancy 08/20/2019   Depression 10/08/2015   Panic disorder 08/03/2015   Incarcerated umbilical hernia 06/21/2014   Generalized anxiety disorder 02/04/2013     Plan:  1. Supervision of high risk pregnancy, antepartum --Anticipatory guidance about next visits/weeks of pregnancy given.   2. Type 2 diabetes mellitus affecting  pregnancy in first trimester, antepartum --Glucose log  fasting mostly 130s, PP high 120s/130s, with 2 outliers of 150s   - Referral to Nutrition and Diabetes Services  3. BMI 50.0-59.9, adult [facility])  - Referral to Nutrition and Diabetes Services  4. Chronic hypertension affecting pregnancy --On labetalol, just started taking TID, thought it was BID        Initial labs reviewed. Continue prenatal vitamins. Discussed and offered genetic screening options, including Quad screen/AFP, NIPS testing, and option to decline testing. Benefits/risks/alternatives reviewed. Pt aware that anatomy  US is form of genetic screening with lower accuracy in detecting trisomies than blood work.  Pt chooses/declines genetic screening today. NIPS and AFP: declined. Ultrasound discussed; fetal anatomic survey: ordered. Problem list reviewed and updated. The nature of [facility] with multiple MDs and other Advanced Practice Providers was explained to patient; also emphasized that residents, students are part of our team. Routine obstetric precautions reviewed. No follow-ups on file.   Sharen Counter, CNM 09/05/22 2:15 PM

## 2022-09-06 MED ORDER — ASPIRIN 81 MG PO TBEC: 81.0000 mg | DELAYED_RELEASE_TABLET | Freq: Every day | ORAL | 5 refills | Status: DC

## 2022-09-20 ENCOUNTER — Telehealth: Payer: Self-pay

## 2022-09-20 NOTE — Telephone Encounter (Signed)
Called patient to follow up on BP readings. Patient states that she has been getting mid 120s/70s. No other sx reported or concerns.

## 2022-09-23 ENCOUNTER — Other Ambulatory Visit: Payer: Self-pay | Admitting: Obstetrics and Gynecology

## 2022-09-23 DIAGNOSIS — O10919 Unspecified pre-existing hypertension complicating pregnancy, unspecified trimester: Secondary | ICD-10-CM

## 2022-09-23 DIAGNOSIS — O0990 Supervision of high risk pregnancy, unspecified, unspecified trimester: Secondary | ICD-10-CM

## 2022-09-26 ENCOUNTER — Encounter: Payer: Self-pay | Admitting: Obstetrics and Gynecology

## 2022-09-26 ENCOUNTER — Telehealth (INDEPENDENT_AMBULATORY_CARE_PROVIDER_SITE_OTHER): Payer: BC Managed Care – PPO | Admitting: Obstetrics and Gynecology

## 2022-09-26 DIAGNOSIS — O360120 Maternal care for anti-D [Rh] antibodies, second trimester, not applicable or unspecified: Secondary | ICD-10-CM

## 2022-09-26 DIAGNOSIS — O0990 Supervision of high risk pregnancy, unspecified, unspecified trimester: Secondary | ICD-10-CM

## 2022-09-26 DIAGNOSIS — O360920 Maternal care for other rhesus isoimmunization, second trimester, not applicable or unspecified: Secondary | ICD-10-CM | POA: Insufficient documentation

## 2022-09-26 DIAGNOSIS — O360190 Maternal care for anti-D [Rh] antibodies, unspecified trimester, not applicable or unspecified: Secondary | ICD-10-CM | POA: Insufficient documentation

## 2022-09-26 DIAGNOSIS — O0992 Supervision of high risk pregnancy, unspecified, second trimester: Secondary | ICD-10-CM

## 2022-09-26 DIAGNOSIS — O10912 Unspecified pre-existing hypertension complicating pregnancy, second trimester: Secondary | ICD-10-CM

## 2022-09-26 DIAGNOSIS — O10919 Unspecified pre-existing hypertension complicating pregnancy, unspecified trimester: Secondary | ICD-10-CM

## 2022-09-26 DIAGNOSIS — O34219 Maternal care for unspecified type scar from previous cesarean delivery: Secondary | ICD-10-CM

## 2022-09-26 DIAGNOSIS — O24912 Unspecified diabetes mellitus in pregnancy, second trimester: Secondary | ICD-10-CM

## 2022-09-26 DIAGNOSIS — Z98891 History of uterine scar from previous surgery: Secondary | ICD-10-CM

## 2022-09-26 MED ORDER — METFORMIN HCL 500 MG PO TABS
1000.0000 mg | ORAL_TABLET | Freq: Two times a day (BID) | ORAL | 5 refills | Status: DC
Start: 2022-09-26 — End: 2022-12-22

## 2022-09-26 NOTE — Progress Notes (Signed)
OBSTETRICS PRENATAL VIRTUAL VISIT ENCOUNTER NOTE  [facility]   Patient location: Home  I connected with Marilyn Cooper on 09/26/22 at  2:50 PM EDT by MyChart Video Encounter and verified that I am speaking with the correct person using two identifiers. I discussed the limitations, risks, security and privacy concerns of performing an evaluation and management service virtually and the availability of in person appointments. I also discussed with the patient that there may be a patient responsible charge related to this service. The patient expressed understanding and agreed to proceed. Subjective:  Marilyn Cooper is a 32 y.o. [id number] at [redacted]w[redacted]d being seen today for ongoing prenatal care.  She is currently monitored for the following issues for this high-risk pregnancy and has Generalized anxiety disorder; Panic disorder; Depression; Chronic hypertension affecting pregnancy; Depressed mood; Major depressive disorder with single episode, in partial remission (HCC); Maternal obesity affecting pregnancy, antepartum; Previous cesarean delivery affecting pregnancy; Diabetes mellitus affecting pregnancy in second trimester; History of 3 cesarean sections; Binge eating disorder; Candidiasis of skin; Benign essential hypertension; Hernia of anterior abdominal wall; Supervision of high risk pregnancy, antepartum; Type 2 diabetes mellitus (HCC); Anti-D antibodies present during pregnancy; and 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  Patient reports no complaints.   .  .   . Denies any leaking of fluid.   The following portions of the patient's history were reviewed and updated as appropriate: allergies, current medications, past family history, past medical history, past social history, past surgical history and problem list.   Objective:  There were no vitals filed for this visit.  Fetal Status:            General:  Alert, oriented and cooperative. Patient is in no acute distress.  Respiratory: Normal respiratory effort, no problems with respiration noted  Mental Status: Normal mood and affect. Normal behavior. Normal judgment and thought content.  Rest of physical exam deferred due to type of encounter  Imaging: No results found.  Assessment and Plan:  Pregnancy: [id number] at [redacted]w[redacted]d 1. Supervision of high risk pregnancy, antepartum Stable  2. History of 3 cesarean sections For repeat at 39 weeks  3. Chronic hypertension affecting pregnancy BP stable Continue with Labetalol 200 mg tid Every day BASA  4. Diabetes mellitus affecting pregnancy in second trimester CBG's almost at goal range Will increase Metformin to 1000 mg po bid - metFORMIN (GLUCOPHAGE) 500 MG tablet; Take 2 tablets (1,000 mg total) by mouth 2 (two) times daily with a meal.  Dispense: 60 tablet; Refill: 5  5. 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 I discussed the assessment and treatment plan with the patient. The patient was provided an opportunity to ask questions and all were answered. The patient agreed with the plan and demonstrated an understanding of the instructions. The patient was advised to call back or seek an in-person office evaluation/go to [facility] at [facility] for any urgent or concerning symptoms. Please refer to After Visit Summary for other counseling recommendations.   I provided 10 minutes of face-to-face time during this encounter.  Return in  about 4 weeks (around 10/24/2022) for OB  visit, face to face, MD only.  Future Appointments  Date Time Provider Department Center  10/03/2022 11:15 AM Constant, Gigi Gin, MD [facility] None  10/05/2022 12:30 PM [facility] NURSE [facility]  10/05/2022 12:30 PM [facility] US5 [facility]  10/05/2022  1:30 PM [facility] PROVIDER 1 [facility]    Hermina Staggers, MD [facility] for [facility], [facility]

## 2022-10-03 ENCOUNTER — Encounter: Payer: Medicaid Other | Admitting: Obstetrics and Gynecology

## 2022-10-04 ENCOUNTER — Encounter: Payer: Self-pay | Admitting: *Deleted

## 2022-10-05 ENCOUNTER — Other Ambulatory Visit: Payer: Self-pay | Admitting: *Deleted

## 2022-10-05 ENCOUNTER — Ambulatory Visit: Payer: BC Managed Care – PPO | Attending: Obstetrics and Gynecology

## 2022-10-05 ENCOUNTER — Ambulatory Visit: Payer: BC Managed Care – PPO | Admitting: *Deleted

## 2022-10-05 ENCOUNTER — Ambulatory Visit (HOSPITAL_BASED_OUTPATIENT_CLINIC_OR_DEPARTMENT_OTHER): Payer: BC Managed Care – PPO | Admitting: Obstetrics

## 2022-10-05 ENCOUNTER — Encounter: Payer: Self-pay | Admitting: *Deleted

## 2022-10-05 VITALS — BP 149/70 | HR 88

## 2022-10-05 DIAGNOSIS — O34219 Maternal care for unspecified type scar from previous cesarean delivery: Secondary | ICD-10-CM

## 2022-10-05 DIAGNOSIS — O99212 Obesity complicating pregnancy, second trimester: Secondary | ICD-10-CM | POA: Diagnosis not present

## 2022-10-05 DIAGNOSIS — Z3A19 19 weeks gestation of pregnancy: Secondary | ICD-10-CM | POA: Diagnosis not present

## 2022-10-05 DIAGNOSIS — O24112 Pre-existing diabetes mellitus, type 2, in pregnancy, second trimester: Secondary | ICD-10-CM | POA: Diagnosis not present

## 2022-10-05 DIAGNOSIS — E1169 Type 2 diabetes mellitus with other specified complication: Secondary | ICD-10-CM

## 2022-10-05 DIAGNOSIS — Z7984 Long term (current) use of oral hypoglycemic drugs: Secondary | ICD-10-CM | POA: Diagnosis not present

## 2022-10-05 DIAGNOSIS — O10912 Unspecified pre-existing hypertension complicating pregnancy, second trimester: Secondary | ICD-10-CM

## 2022-10-05 DIAGNOSIS — O361120 Maternal care for Anti-A sensitization, second trimester, not applicable or unspecified: Secondary | ICD-10-CM | POA: Insufficient documentation

## 2022-10-05 DIAGNOSIS — O09292 Supervision of pregnancy with other poor reproductive or obstetric history, second trimester: Secondary | ICD-10-CM

## 2022-10-05 DIAGNOSIS — E669 Obesity, unspecified: Secondary | ICD-10-CM

## 2022-10-05 DIAGNOSIS — O10919 Unspecified pre-existing hypertension complicating pregnancy, unspecified trimester: Secondary | ICD-10-CM | POA: Insufficient documentation

## 2022-10-05 DIAGNOSIS — O10012 Pre-existing essential hypertension complicating pregnancy, second trimester: Secondary | ICD-10-CM

## 2022-10-05 DIAGNOSIS — O09212 Supervision of pregnancy with history of pre-term labor, second trimester: Secondary | ICD-10-CM

## 2022-10-05 DIAGNOSIS — O0990 Supervision of high risk pregnancy, unspecified, unspecified trimester: Secondary | ICD-10-CM | POA: Insufficient documentation

## 2022-10-05 DIAGNOSIS — O360920 Maternal care for other rhesus isoimmunization, second trimester, not applicable or unspecified: Secondary | ICD-10-CM

## 2022-10-05 DIAGNOSIS — O360120 Maternal care for anti-D [Rh] antibodies, second trimester, not applicable or unspecified: Secondary | ICD-10-CM

## 2022-10-05 NOTE — Progress Notes (Signed)
MFM Note  Marilyn Cooper was seen due to maternal obesity with a BMI of 58, chronic hypertension treated with labetalol 3 times a day, pregestational diabetes treated with metformin 1000 mg twice a day.   She has screened positive in her current pregnancy for the anti-Duffy (Fya) and anti-E antibodies that were too weak to titer.  Her husband, Marilyn Cooper, was screened during her last pregnancy and found to be positive for the Duffy (Fya and Fyb) antigens on his red blood cells. She has received a blood transfusion in the past.  Her most recent hemoglobin A1c drawn a month ago was 7%.   She has a history of 4 prior cesarean deliveries.   She has declined a cell free DNA test to screen for fetal aneuploidy in her current pregnancy.   The fetal biometry measurements obtained today are consistent with an [facility] of February 26, 2023, making her 19 weeks and 3 days pregnant today.    The views of the fetal anatomy were limited today due to extreme maternal body habitus and her prior C-section scars.   The following were discussed during today's consultation:   Anti-Duffy and anti-E antibodies that were too weak to titer   The patient was advised that she can have the Unity cell free DNA test drawn to screen for fetal aneuploidy and to determine if the fetus that she is carrying have the Duffy and E antigens on its red blood cells.    Should the fetus be negative for the Duffy and E antigens, her fetus should not be at risk for anemia.   She will consider having the Unity cell free DNA test drawn soon.  She was advised to call our office should she want this test.    The implications and management of isoimmunization was discussed with the patient and her partner today.  She was advised that should the fetus that she is carrying have the Duffy or E antigens on its red blood cells, that the anti-Duffy and anti-E antibodies in her blood may cross over the placenta and cause fetal anemia.    Should  the patient decline to have the Unity cell free DNA test drawn, she should continue to have monthly type and screens drawn to follow the anti-E and anti-Duffy antibody titer levels.   Should there be a progressive increase in the anti-Duffy or anti-E antibody titer levels or should the antibody titer levels reach a critical titer of 1:16 or greater, we will follow her with frequent MCA Doppler studies to screen for fetal anemia.   The patient and her partner understand that should fetal anemia be suspected during her future exams, that an intrauterine fetal blood transfusion or delivery depending on her gestational age may be indicated.   Chronic hypertension in pregnancy  The implications and management of chronic hypertension in pregnancy was discussed. The patient was advised that should her blood pressures be persistently elevated above the 140s over 90s range, the dosage of her antihypertensive medication may need to be increased.     The increased risk of superimposed preeclampsia, an indicated preterm delivery, and possible fetal growth restriction due to chronic hypertension in pregnancy was discussed.    We will continue to follow her with monthly growth scans.   Weekly fetal testing should be started at around 32 weeks.    To decrease her risk of superimposed preeclampsia, she should continue taking a daily baby aspirin (81 mg daily) for preeclampsia prophylaxis.    Diabetes  in pregnancy   The implications and management of diabetes in pregnancy was discussed in detail with the patient.    She was advised to continue taking metformin throughout her pregnancy.  She was advised that our goals for her fingerstick values are fasting values of 90-95 or less and two-hour postprandials of 120 or less.    Should the majority of her fingerstick values be above these values, she may have to be started on insulin to help her achieve better glycemic control.   The patient was advised that  getting her fingerstick values as close to these goals as possible would provide her with the most optimal obstetrical outcome.  Due to pregestational diabetes, she was referred to [facility] pediatric cardiology for a fetal echocardiogram.  Obesity in pregnancy with 4 prior C-sections   The patient was advised that maternal obesity increases the risk of adverse pregnancy outcomes such as miscarriages/stillbirth, congenital anomalies such as neural tube defects/spina bifida, and may increase the risk of preeclampsia and gestational diabetes requiring an indicated preterm birth.   The recommended total weight gain in pregnancy for obese women's between 10 to 20 pounds.  As this will be her 5th C-section, she should probably have an anesthesia consult prior to delivery.   A normal-appearing anterior placenta was noted today.  There were no signs of placenta previa or placenta accreta noted today.  We will continue to assess the fetal anatomy during her future ultrasound exams.  A follow-up exam was scheduled in 4 weeks.  At the end of the consultation, the patient stated that all her questions have been answered.    A total of 40 minutes was spent counseling and coordinating the care for this patient.  Greater than 50% of the time was spent in direct face-to-face contact.

## 2022-10-21 DIAGNOSIS — O24919 Unspecified diabetes mellitus in pregnancy, unspecified trimester: Secondary | ICD-10-CM | POA: Diagnosis not present

## 2022-10-21 DIAGNOSIS — O24912 Unspecified diabetes mellitus in pregnancy, second trimester: Secondary | ICD-10-CM | POA: Diagnosis not present

## 2022-10-21 DIAGNOSIS — Z3A21 21 weeks gestation of pregnancy: Secondary | ICD-10-CM | POA: Diagnosis not present

## 2022-10-27 ENCOUNTER — Telehealth (INDEPENDENT_AMBULATORY_CARE_PROVIDER_SITE_OTHER): Payer: BC Managed Care – PPO | Admitting: Obstetrics and Gynecology

## 2022-10-27 ENCOUNTER — Encounter: Payer: Self-pay | Admitting: Obstetrics and Gynecology

## 2022-10-27 VITALS — BP 117/72 | HR 86 | Wt 306.0 lb

## 2022-10-27 DIAGNOSIS — O0990 Supervision of high risk pregnancy, unspecified, unspecified trimester: Secondary | ICD-10-CM

## 2022-10-27 DIAGNOSIS — O360920 Maternal care for other rhesus isoimmunization, second trimester, not applicable or unspecified: Secondary | ICD-10-CM

## 2022-10-27 DIAGNOSIS — O0992 Supervision of high risk pregnancy, unspecified, second trimester: Secondary | ICD-10-CM

## 2022-10-27 DIAGNOSIS — O360120 Maternal care for anti-D [Rh] antibodies, second trimester, not applicable or unspecified: Secondary | ICD-10-CM

## 2022-10-27 DIAGNOSIS — E1169 Type 2 diabetes mellitus with other specified complication: Secondary | ICD-10-CM

## 2022-10-27 DIAGNOSIS — O10912 Unspecified pre-existing hypertension complicating pregnancy, second trimester: Secondary | ICD-10-CM

## 2022-10-27 DIAGNOSIS — O34219 Maternal care for unspecified type scar from previous cesarean delivery: Secondary | ICD-10-CM

## 2022-10-27 DIAGNOSIS — O10919 Unspecified pre-existing hypertension complicating pregnancy, unspecified trimester: Secondary | ICD-10-CM

## 2022-10-27 NOTE — Progress Notes (Signed)
MyChart OB, Fasting BS 93-107/ 2hrs 84-117

## 2022-10-27 NOTE — Progress Notes (Signed)
OBSTETRICS PRENATAL VIRTUAL VISIT ENCOUNTER NOTE  [facility] location: [facility]   Patient location: Home  I connected with Marilyn Cooper on 10/27/22 at  1:30 PM EDT by MyChart Video Encounter and verified that I am speaking with the correct person using two identifiers. I discussed the limitations, risks, security and privacy concerns of performing an evaluation and management service virtually and the availability of in person appointments. I also discussed with the patient that there may be a patient responsible charge related to this service. The patient expressed understanding and agreed to proceed. Subjective:  Marilyn Cooper is a 32 y.o. [id number] at [redacted]w[redacted]d being seen today for ongoing prenatal care.  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  Patient reports no complaints.  Contractions: Not present. Vag. Bleeding: None.  Movement: Present. Denies any leaking of fluid.   The following portions of the patient's history were reviewed and updated as appropriate: allergies, current medications, past family history, past medical history, past social history, past surgical history and problem list.   Objective:   Vitals:   10/27/22 1350  BP: 117/72  Pulse: 86  Weight: (!) 306 lb (138.8 kg)    Fetal Status:     Movement: Present     General:  Alert, oriented and cooperative. Patient is in no acute distress.  Respiratory: Normal respiratory effort, no  problems with respiration noted  Mental Status: Normal mood and affect. Normal behavior. Normal judgment and thought content.  Rest of physical exam deferred due to type of encounter  Imaging: [place] MFM OB DETAIL +14 WK  Result Date: 10/05/2022 ----------------------------------------------------------------------  OBSTETRICS REPORT                       (Signed Final 10/05/2022 03:53 pm) ---------------------------------------------------------------------- Patient Info  ID #:       [id number]                          D.O.B.:  [birth year] (32 yrs)  Name:       Marilyn Cooper                Visit Date: 10/05/2022 12:15 pm ---------------------------------------------------------------------- Performed By  Attending:        Ma Rings MD         Ref. Address:     [address]                                                             [place]  [address]                                                             [place]                                                             [address]  Performed By:     Marcellina Millin       [facility]  Referred By:      [facility] ---------------------------------------------------------------------- Orders  #  Description                           Code        Ordered By  1  [place] MFM OB DETAIL +14 WK               76811.01    [facility] ----------------------------------------------------------------------  #  Order #                     Accession #                Episode #  1  [id number]                   [id number]                 [id number] ---------------------------------------------------------------------- Indications  Obesity complicating pregnancy, second          O99.212  trimester (BMI 58)  Pre-existing diabetes, type 2, in pregnancy,   O24.112  second trimester (Metformin)  Hypertension - Chronic/Pre-existing            O10.019  (labetalol)  Previous cesarean delivery, antepartum (x4)    O34.219  Anti-E isoimmunization affecting pregnancy     O36.0920  in 2nd trimester  Maternal care for Anti-Fy (a) Duffy            O36.0120  antibodies, second trimester, unspecified  Short interval between pregnancies, 2nd        O09.892  trimester (delivered 05/04/21)  Poor obstetric history: Previous               O09.299  preeclampsia (x2)  Poor obstetric history: Previous preterm       O09.219  delivery, antepartum (34w and 36w, both  C/S due to pre-eclampsia)  Fetal or maternal indication (History of       O35.8XX0  surgical repair of umbilical hernia)  Encounter for antenatal screening for          Z36.3  malformations  [redacted] weeks gestation of pregnancy                Z3A.19  Declined genetic screening ---------------------------------------------------------------------- Vital Signs  BP:          149/70 ---------------------------------------------------------------------- Fetal Evaluation  Num Of Fetuses:         1  Fetal Heart Rate(bpm):  149  Cardiac Activity:       Observed  Presentation:           Transverse, head to maternal right  Placenta:               Anterior  P. Cord Insertion:      Not well visualized  Amniotic Fluid  AFI FV:      Within normal limits                              Largest Pocket(cm)                              5.82 ---------------------------------------------------------------------- Biometry  BPD:      42.8  mm     G. Age:  19w 0d         30  %    CI:        69.85   %    70 - 86                                                          FL/HC:      18.6   %    16.1 - 18.3  HC:      163.4  mm     G. Age:  19w 1d         27  %    HC/AC:      1.00        1.09 - 1.39  AC:      162.6  mm     G. Age:  21w 2d         94  %    FL/BPD:     71.0   %  FL:        30.4  mm     G. Age:  19w 3d         42  %    FL/AC:      18.7   %    20 - 24  HUM:      25.9  mm     G. Age:  18w 1d         17  %  Est. FW:  342  gm    0 lb 12 oz      89  % ---------------------------------------------------------------------- OB History  Gravidity:    9         Term:   2        Prem:   2        SAB:   1  TOP:          3       Ectopic:  0        Living: 4 ---------------------------------------------------------------------- Gestational Age  LMP:           19w 3d        Date:  05/22/22                   EDD:   02/26/23  U/S Today:     19w 5d                                        EDD:   02/24/23  Best:          19w 3d     Det. By:  LMP  (05/22/22)          EDD:   02/26/23 ---------------------------------------------------------------------- Anatomy  Cranium:               Appears normal         Bladder:                Appears normal  Stomach:               Appears normal, left   Upper Extremities:      Visualized                         sided  Cord Vessels:          Appears normal (3      Lower Extremities:      Visualized                         vessel cord)  Other:  Technically difficult due to maternal habitus and fetal position. ---------------------------------------------------------------------- Doppler - Fetal Vessels  Middle Cerebral Artery                                                       PSV   MoM                                                     (cm/s)                                                      25.27  1.03 ---------------------------------------------------------------------- Cervix Uterus Adnexa  Cervix  Length:            3.7  cm.  Normal appearance by transabdominal scan ---------------------------------------------------------------------- Comments  Mariaines Sawhill was seen due to maternal obesity with a BMI  of 58, chronic hypertension treated with labetalol 3 times a  day, pregestational diabetes treated with metformin 1000 mg  twice a day.  She has screened positive  in her current pregnancy for the  anti-Duffy (Fya) and anti-E antibodies that were too weak to  titer.  Her husband, Cathren Laine, was screened during  her last pregnancy and found to be positive for the Duffy (Fya  and Fyb) antigens on his red blood cells. She has received a  blood transfusion in the past.  Her most recent hemoglobin A1c drawn a month ago was 7%.  She has a history of 4 prior cesarean deliveries.  She has declined a cell free DNA test to screen for fetal  aneuploidy in her current pregnancy.  The fetal biometry measurements obtained today are  consistent with an [facility] of February 26, 2023, making her 19  weeks and 3 days pregnant today.  The views of the fetal anatomy were limited today due to  extreme maternal body habitus and her prior C-section scars.  The following were discussed during today's consultation:  Anti-Duffy and anti-E antibodies that were too weak to titer  The patient was advised that she can have the Unity cell free  DNA test drawn to screen for fetal aneuploidy and to  determine if the fetus that she is carrying have the Duffy and  E antigens on its red blood cells.  Should the fetus be negative for the Duffy and E antigens,  her fetus should not be at risk for anemia.  She will consider having the Unity cell free DNA test drawn  soon.  She was advised to call our office should she want this  test.  The implications and management of isoimmunization was  discussed with the patient and her partner today.  She was  advised that should the fetus that she is carrying have the  Duffy or E antigens on its red blood cells, that the anti-Duffy  and anti-E antibodies in her blood may cross over the  placenta and cause fetal anemia.  Should the patient decline to have the Unity cell free DNA  test drawn, she should continue to have monthly type and  screens drawn to follow the anti-E and anti-Duffy antibody  titer levels.  Should there be a progressive increase in the anti-Duffy or  anti-E  antibody titer levels or should the antibody titer levels  reach a critical titer of 1:16 or greater, we will follow her with  frequent MCA Doppler studies to screen for fetal anemia.  The patient and her partner understand that should fetal  anemia be suspected during her future exams, that an  intrauterine fetal blood transfusion or delivery depending on  her gestational age may be indicated.  Chronic hypertension in pregnancy  The implications and management of chronic hypertension in  pregnancy was discussed. The patient was advised that  should her blood pressures be persistently elevated above  the 140s over 90s range, the dosage of her antihypertensive  medication may need to be increased.  The increased risk of superimposed preeclampsia, an  indicated preterm delivery, and possible fetal growth  restriction due to chronic hypertension in pregnancy was  discussed.  We will continue to follow her with monthly growth scans.  Weekly fetal testing should be started at around 32 weeks.  To decrease her risk of superimposed preeclampsia, she  should continue taking a daily baby  aspirin (81 mg daily) for  preeclampsia prophylaxis.  Diabetes in pregnancy  The implications and management of diabetes in pregnancy  was discussed in detail with the patient.  She was advised to continue taking metformin throughout her  pregnancy.  She was advised that our goals for her fingerstick values are  fasting values of 90-95 or less and two-hour postprandials of  120 or less.  Should the majority of her fingerstick values be above these  values, she may have to be started on insulin to help her  achieve better glycemic control.  The patient was advised that getting her fingerstick values as  close to these goals as possible would provide her with the  most optimal obstetrical outcome.  Due to pregestational diabetes, she was referred to [facility] pediatric cardiology for a fetal echocardiogram.  Obesity in pregnancy with 4  prior C-sections  The patient was advised that maternal obesity increases the  risk of adverse pregnancy outcomes such as  miscarriages/stillbirth, congenital anomalies such as neural  tube defects/spina bifida, and may increase the risk of  preeclampsia and gestational diabetes requiring an indicated  preterm birth.  The recommended total weight gain in pregnancy for obese  women's between 10 to 20 pounds.  As this will be her 5th C-section, she should probably have an  anesthesia consult prior to delivery.  A normal-appearing anterior placenta was noted today.  There were no signs of placenta previa or placenta accreta  noted today.  We will continue to assess the fetal anatomy during her  future ultrasound exams.  A follow-up exam was scheduled in 4 weeks.  At the end of the consultation, the patient stated that all her  questions have been answered.  A total of 40 minutes was spent counseling and coordinating  the care for this patient.  Greater than 50% of the time was  spent in direct face-to-face contact. ----------------------------------------------------------------------                   Ma Rings, MD Electronically Signed Final Report   10/05/2022 03:53 pm ----------------------------------------------------------------------    Assessment and Plan:  Pregnancy: [id number] at [redacted]w[redacted]d 1. Supervision of high risk pregnancy, antepartum Stable  2. Chronic hypertension affecting pregnancy Stable Continue with current management    3. Anti-D antibodies present during pregnancy in second trimester, single or unspecified fetus FOB has been tested in the past and was negative  4. 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 Type 2 diabetes mellitus with other specified complication, without long-term current use of insulin (HCC) CBG's in goal range  Continue with Metformin 1000 mg bid  6. Previous cesarean delivery affecting  pregnancy Stable  Preterm labor symptoms and general obstetric precautions including but not limited to vaginal bleeding, contractions, leaking of fluid and fetal movement were reviewed in detail with the patient. I discussed the assessment and treatment plan with the patient. The patient was provided an opportunity to ask questions and all were answered. The patient agreed with the plan and demonstrated an understanding of the instructions. The patient was advised to call back or seek an in-person office evaluation/go to [facility] at [facility] for any urgent or concerning symptoms. Please refer to After Visit Summary for other counseling recommendations.   I provided 10 minutes of face-to-face time during this encounter.  Return in about 4 weeks (around 11/24/2022) for OB visit, face to face, MD only.  Future  Appointments  Date Time Provider Department Center  10/31/2022 12:15 PM [facility] NURSE [facility]  10/31/2022 12:30 PM [facility] US3 [facility]    Hermina Staggers, MD [facility] for [facility], [facility]

## 2022-10-31 ENCOUNTER — Ambulatory Visit: Payer: BC Managed Care – PPO

## 2022-11-02 ENCOUNTER — Ambulatory Visit: Payer: BC Managed Care – PPO

## 2022-11-04 ENCOUNTER — Ambulatory Visit: Payer: BC Managed Care – PPO

## 2022-11-05 ENCOUNTER — Institutional Professional Consult (permissible substitution): Payer: Self-pay | Admitting: Licensed Clinical Social Worker

## 2022-11-11 ENCOUNTER — Telehealth: Payer: Self-pay

## 2022-11-11 ENCOUNTER — Other Ambulatory Visit: Payer: Self-pay | Admitting: *Deleted

## 2022-11-11 ENCOUNTER — Ambulatory Visit: Payer: BC Managed Care – PPO | Attending: Obstetrics | Admitting: *Deleted

## 2022-11-11 ENCOUNTER — Ambulatory Visit (HOSPITAL_BASED_OUTPATIENT_CLINIC_OR_DEPARTMENT_OTHER): Payer: BC Managed Care – PPO

## 2022-11-11 ENCOUNTER — Ambulatory Visit: Payer: BC Managed Care – PPO

## 2022-11-11 VITALS — BP 134/69

## 2022-11-11 VITALS — BP 151/72 | HR 89

## 2022-11-11 DIAGNOSIS — O99212 Obesity complicating pregnancy, second trimester: Secondary | ICD-10-CM | POA: Diagnosis not present

## 2022-11-11 DIAGNOSIS — O10012 Pre-existing essential hypertension complicating pregnancy, second trimester: Secondary | ICD-10-CM | POA: Diagnosis not present

## 2022-11-11 DIAGNOSIS — O24112 Pre-existing diabetes mellitus, type 2, in pregnancy, second trimester: Secondary | ICD-10-CM

## 2022-11-11 DIAGNOSIS — O09292 Supervision of pregnancy with other poor reproductive or obstetric history, second trimester: Secondary | ICD-10-CM | POA: Insufficient documentation

## 2022-11-11 DIAGNOSIS — O09212 Supervision of pregnancy with history of pre-term labor, second trimester: Secondary | ICD-10-CM

## 2022-11-11 DIAGNOSIS — Z363 Encounter for antenatal screening for malformations: Secondary | ICD-10-CM | POA: Diagnosis not present

## 2022-11-11 DIAGNOSIS — E669 Obesity, unspecified: Secondary | ICD-10-CM

## 2022-11-11 DIAGNOSIS — O09891 Supervision of other high risk pregnancies, first trimester: Secondary | ICD-10-CM | POA: Diagnosis not present

## 2022-11-11 DIAGNOSIS — Z3A24 24 weeks gestation of pregnancy: Secondary | ICD-10-CM | POA: Diagnosis not present

## 2022-11-11 DIAGNOSIS — O99210 Obesity complicating pregnancy, unspecified trimester: Secondary | ICD-10-CM

## 2022-11-11 DIAGNOSIS — O360920 Maternal care for other rhesus isoimmunization, second trimester, not applicable or unspecified: Secondary | ICD-10-CM

## 2022-11-11 DIAGNOSIS — Z98891 History of uterine scar from previous surgery: Secondary | ICD-10-CM | POA: Insufficient documentation

## 2022-11-11 DIAGNOSIS — O10912 Unspecified pre-existing hypertension complicating pregnancy, second trimester: Secondary | ICD-10-CM | POA: Diagnosis not present

## 2022-11-11 DIAGNOSIS — O360120 Maternal care for anti-D [Rh] antibodies, second trimester, not applicable or unspecified: Secondary | ICD-10-CM

## 2022-11-11 DIAGNOSIS — O0990 Supervision of high risk pregnancy, unspecified, unspecified trimester: Secondary | ICD-10-CM

## 2022-11-11 DIAGNOSIS — Z7984 Long term (current) use of oral hypoglycemic drugs: Secondary | ICD-10-CM | POA: Diagnosis not present

## 2022-11-11 DIAGNOSIS — O34219 Maternal care for unspecified type scar from previous cesarean delivery: Secondary | ICD-10-CM

## 2022-11-11 DIAGNOSIS — O361120 Maternal care for Anti-A sensitization, second trimester, not applicable or unspecified: Secondary | ICD-10-CM

## 2022-11-11 NOTE — Telephone Encounter (Signed)
Per Dr. Loretta Plume request, I called the patient to ask for consent to proceed with Unity testing. Patient gave verbal consent that we can proceed with testing and sign the paper form on her behalf.

## 2022-11-16 ENCOUNTER — Other Ambulatory Visit: Payer: Self-pay | Admitting: *Deleted

## 2022-11-16 DIAGNOSIS — O361190 Maternal care for Anti-A sensitization, unspecified trimester, not applicable or unspecified: Secondary | ICD-10-CM

## 2022-11-16 DIAGNOSIS — O10912 Unspecified pre-existing hypertension complicating pregnancy, second trimester: Secondary | ICD-10-CM

## 2022-11-21 ENCOUNTER — Other Ambulatory Visit: Payer: Self-pay

## 2022-11-22 ENCOUNTER — Ambulatory Visit: Payer: BC Managed Care – PPO

## 2022-11-22 ENCOUNTER — Ambulatory Visit: Payer: BC Managed Care – PPO | Attending: Obstetrics | Admitting: *Deleted

## 2022-11-22 ENCOUNTER — Other Ambulatory Visit: Payer: Self-pay | Admitting: *Deleted

## 2022-11-22 ENCOUNTER — Other Ambulatory Visit: Payer: Self-pay

## 2022-11-22 VITALS — BP 144/74 | HR 88

## 2022-11-22 DIAGNOSIS — O09212 Supervision of pregnancy with history of pre-term labor, second trimester: Secondary | ICD-10-CM

## 2022-11-22 DIAGNOSIS — E669 Obesity, unspecified: Secondary | ICD-10-CM

## 2022-11-22 DIAGNOSIS — O34219 Maternal care for unspecified type scar from previous cesarean delivery: Secondary | ICD-10-CM

## 2022-11-22 DIAGNOSIS — O360920 Maternal care for other rhesus isoimmunization, second trimester, not applicable or unspecified: Secondary | ICD-10-CM

## 2022-11-22 DIAGNOSIS — O0990 Supervision of high risk pregnancy, unspecified, unspecified trimester: Secondary | ICD-10-CM

## 2022-11-22 DIAGNOSIS — O09292 Supervision of pregnancy with other poor reproductive or obstetric history, second trimester: Secondary | ICD-10-CM

## 2022-11-22 DIAGNOSIS — O112 Pre-existing hypertension with pre-eclampsia, second trimester: Secondary | ICD-10-CM | POA: Diagnosis not present

## 2022-11-22 DIAGNOSIS — E118 Type 2 diabetes mellitus with unspecified complications: Secondary | ICD-10-CM | POA: Insufficient documentation

## 2022-11-22 DIAGNOSIS — O99212 Obesity complicating pregnancy, second trimester: Secondary | ICD-10-CM

## 2022-11-22 DIAGNOSIS — O10012 Pre-existing essential hypertension complicating pregnancy, second trimester: Secondary | ICD-10-CM | POA: Diagnosis not present

## 2022-11-22 DIAGNOSIS — O24112 Pre-existing diabetes mellitus, type 2, in pregnancy, second trimester: Secondary | ICD-10-CM

## 2022-11-22 DIAGNOSIS — O361120 Maternal care for Anti-A sensitization, second trimester, not applicable or unspecified: Secondary | ICD-10-CM

## 2022-11-22 DIAGNOSIS — Z7984 Long term (current) use of oral hypoglycemic drugs: Secondary | ICD-10-CM

## 2022-11-22 DIAGNOSIS — O361190 Maternal care for Anti-A sensitization, unspecified trimester, not applicable or unspecified: Secondary | ICD-10-CM

## 2022-11-22 DIAGNOSIS — O10912 Unspecified pre-existing hypertension complicating pregnancy, second trimester: Secondary | ICD-10-CM

## 2022-11-22 DIAGNOSIS — O360120 Maternal care for anti-D [Rh] antibodies, second trimester, not applicable or unspecified: Secondary | ICD-10-CM | POA: Diagnosis not present

## 2022-11-22 DIAGNOSIS — Z3A26 26 weeks gestation of pregnancy: Secondary | ICD-10-CM | POA: Diagnosis not present

## 2022-11-22 DIAGNOSIS — E1169 Type 2 diabetes mellitus with other specified complication: Secondary | ICD-10-CM

## 2022-11-24 ENCOUNTER — Ambulatory Visit: Payer: BC Managed Care – PPO | Admitting: Obstetrics and Gynecology

## 2022-11-24 ENCOUNTER — Other Ambulatory Visit: Payer: Self-pay | Admitting: Obstetrics and Gynecology

## 2022-11-24 VITALS — BP 148/85 | HR 88 | Wt 300.0 lb

## 2022-11-24 DIAGNOSIS — Z6841 Body Mass Index (BMI) 40.0 and over, adult: Secondary | ICD-10-CM

## 2022-11-24 DIAGNOSIS — O34219 Maternal care for unspecified type scar from previous cesarean delivery: Secondary | ICD-10-CM

## 2022-11-24 DIAGNOSIS — O24112 Pre-existing diabetes mellitus, type 2, in pregnancy, second trimester: Secondary | ICD-10-CM

## 2022-11-24 DIAGNOSIS — Z3A26 26 weeks gestation of pregnancy: Secondary | ICD-10-CM

## 2022-11-24 DIAGNOSIS — O10919 Unspecified pre-existing hypertension complicating pregnancy, unspecified trimester: Secondary | ICD-10-CM

## 2022-11-24 DIAGNOSIS — O360120 Maternal care for anti-D [Rh] antibodies, second trimester, not applicable or unspecified: Secondary | ICD-10-CM

## 2022-11-24 DIAGNOSIS — O360920 Maternal care for other rhesus isoimmunization, second trimester, not applicable or unspecified: Secondary | ICD-10-CM

## 2022-11-24 DIAGNOSIS — O24119 Pre-existing diabetes mellitus, type 2, in pregnancy, unspecified trimester: Secondary | ICD-10-CM | POA: Insufficient documentation

## 2022-11-24 DIAGNOSIS — O99210 Obesity complicating pregnancy, unspecified trimester: Secondary | ICD-10-CM

## 2022-11-24 DIAGNOSIS — O0990 Supervision of high risk pregnancy, unspecified, unspecified trimester: Secondary | ICD-10-CM

## 2022-11-24 MED ORDER — INSULIN NPH (HUMAN) (ISOPHANE) 100 UNIT/ML ~~LOC~~ SUSP: 6.0000 [IU] | Freq: Every day | SUBCUTANEOUS | 11 refills | Status: DC

## 2022-11-24 MED ORDER — INSULIN SYRINGE 29G X 1/2" 1 ML MISC: 5 refills | Status: DC

## 2022-11-24 NOTE — Progress Notes (Signed)
   PRENATAL VISIT NOTE  Subjective:  Marilyn Cooper is a 32 y.o. [phone] at [redacted]w[redacted]d being seen today for ongoing prenatal care.  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  Patient doing well with no acute concerns today. She reports no complaints.  Contractions: Not present. Vag. Bleeding: None.  Movement: Present. Denies leaking of fluid.   The following portions of the patient's history were reviewed and updated as appropriate: allergies, current medications, past family history, past medical history, past social history, past surgical history and problem list. Problem list updated.  Objective:   Vitals:   11/24/22 1106 11/24/22 1109  BP: (!) 159/89 (!) 148/85  Pulse: 90 88  Weight: 300 lb (136.1 kg)     Fetal Status: Fetal Heart Rate (bpm): 152   Movement: Present     General:  Alert, oriented and cooperative. Patient is in no acute distress.  Skin: Skin is warm and dry. No rash noted.   Cardiovascular: Normal heart rate noted  Respiratory: Normal respiratory effort, no problems with respiration noted  Abdomen: Soft, gravid, appropriate for gestational age.  Pain/Pressure: Absent     Pelvic: Cervical exam deferred        Extremities: Normal range of motion.  Edema: None  Mental Status:  Normal mood and affect. Normal behavior. Normal judgment and thought content.   Assessment and Plan:  Pregnancy: [id number] at [redacted]w[redacted]d  1. [redacted] weeks gestation of  pregnancy   2. Chronic hypertension affecting pregnancy BP still elevated, may need to increase labetalol dosage or add procardia if this is consistent  3. 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 BMI 50.0-59.9, adult (HCC)   6. Obesity affecting pregnancy, antepartum, unspecified obesity type   7. Previous cesarean delivery affecting pregnancy Pt has had c/s x 4, will need repeat at time of delivery  8. Supervision of high risk pregnancy, antepartum Continue routine prenatal care  9. Pregnancy with type 2 diabetes mellitus in second trimester FBS: 99-107 PPBS: 89-121  Spoke with Dr. Bryn Gulling who recommend adding 6 units of NPH at 9 pm nightly to address fastings, rx has been sent  Preterm labor symptoms and general obstetric precautions including but not limited to vaginal bleeding, contractions, leaking of fluid and fetal movement were reviewed in detail with the patient.  Please refer to After Visit Summary for other counseling recommendations.   Return in about 2 weeks (around 12/08/2022) for [facility], in person, 3rd trim labs.   Mariel Aloe, MD Faculty Attending [facility] for [facility]

## 2022-11-24 NOTE — Progress Notes (Signed)
Pt reports fetal movement, states that fasting BG range is around 107-121.

## 2022-11-29 ENCOUNTER — Telehealth: Payer: Self-pay

## 2022-11-29 ENCOUNTER — Other Ambulatory Visit: Payer: Self-pay | Admitting: Obstetrics and Gynecology

## 2022-11-29 MED ORDER — INSULIN NPH (HUMAN) (ISOPHANE) 100 UNIT/ML ~~LOC~~ SUSP: 6.0000 [IU] | Freq: Every day | SUBCUTANEOUS | 11 refills | Status: DC

## 2022-11-29 NOTE — Telephone Encounter (Signed)
Patient called stating that she started taking insulin Friday night. States that since starting on the insulin her fasting blood sugars have gone up to above 120. Prior to starting the insulin states that her blood sugars were ranging between 95-107. She is taking 1 unit at 9 pm. She also states that her postprandial after breakfast is also higher. Not above 120, but higher than they had been previously before starting the insulin. States that she has not changed anything with her diet from what she was doing previously. She is eating a keto bread with sausage or bacon with an egg. States that she is eating a low carb low diet for her there meals and snacks.  Please advise

## 2022-11-29 NOTE — Telephone Encounter (Signed)
Called patient to follow on insulin usage. Patient states that she picked up insulin syringes last week. Patient states that she received a message stating that an alternative was requested for insulin last week on 11/7. Patient states that she went to pick up what was ready which was syringes. Patient states that she has been injecting what looked like prefilled syringes into her abdomen all weekend. I called pharmacy to verify what patient picked up, as it did not appear that prefilled syringes had been ordered for her.   Patient advised that syringes that she had been using were empty due to PA needed for Novolin N rx that was sent.   Patient upset with the confusion of insulin and empty syringes and stated that syringes looked like they had insulin in them.  Discussed with Dr. Jolayne Panther, who sent in another rx the pharmacy.  Rx covered by patient insurance. Pharmacy will need to order rx and will contact patient when ready for pick up. Patient advised of updated rx and instructions.  Patient also educated on aspirating 6 units of air before inserting needle into the insulin vial. Advised to hold bottle upside down, push the air into the bottle and then pull back the insulin to 6 units on the syringe. Patient verbalized understanding.  Advised to continue to monitor blood sugar levels and bring log with her to next visit.

## 2022-12-08 ENCOUNTER — Ambulatory Visit: Payer: BC Managed Care – PPO | Admitting: *Deleted

## 2022-12-08 ENCOUNTER — Ambulatory Visit: Payer: BC Managed Care – PPO | Attending: Obstetrics

## 2022-12-08 ENCOUNTER — Ambulatory Visit: Payer: BC Managed Care – PPO | Admitting: Obstetrics & Gynecology

## 2022-12-08 ENCOUNTER — Other Ambulatory Visit: Payer: Self-pay

## 2022-12-08 VITALS — BP 116/80 | HR 92

## 2022-12-08 VITALS — BP 124/79 | HR 91 | Wt 300.0 lb

## 2022-12-08 VITALS — BP 141/74

## 2022-12-08 DIAGNOSIS — O10013 Pre-existing essential hypertension complicating pregnancy, third trimester: Secondary | ICD-10-CM

## 2022-12-08 DIAGNOSIS — O10912 Unspecified pre-existing hypertension complicating pregnancy, second trimester: Secondary | ICD-10-CM | POA: Insufficient documentation

## 2022-12-08 DIAGNOSIS — O99213 Obesity complicating pregnancy, third trimester: Secondary | ICD-10-CM | POA: Diagnosis not present

## 2022-12-08 DIAGNOSIS — O24113 Pre-existing diabetes mellitus, type 2, in pregnancy, third trimester: Secondary | ICD-10-CM

## 2022-12-08 DIAGNOSIS — O361190 Maternal care for Anti-A sensitization, unspecified trimester, not applicable or unspecified: Secondary | ICD-10-CM | POA: Diagnosis not present

## 2022-12-08 DIAGNOSIS — O0990 Supervision of high risk pregnancy, unspecified, unspecified trimester: Secondary | ICD-10-CM

## 2022-12-08 DIAGNOSIS — O09293 Supervision of pregnancy with other poor reproductive or obstetric history, third trimester: Secondary | ICD-10-CM

## 2022-12-08 DIAGNOSIS — O34219 Maternal care for unspecified type scar from previous cesarean delivery: Secondary | ICD-10-CM

## 2022-12-08 DIAGNOSIS — O360930 Maternal care for other rhesus isoimmunization, third trimester, not applicable or unspecified: Secondary | ICD-10-CM | POA: Diagnosis not present

## 2022-12-08 DIAGNOSIS — O99210 Obesity complicating pregnancy, unspecified trimester: Secondary | ICD-10-CM

## 2022-12-08 DIAGNOSIS — O361120 Maternal care for Anti-A sensitization, second trimester, not applicable or unspecified: Secondary | ICD-10-CM | POA: Diagnosis not present

## 2022-12-08 DIAGNOSIS — O361130 Maternal care for Anti-A sensitization, third trimester, not applicable or unspecified: Secondary | ICD-10-CM | POA: Diagnosis not present

## 2022-12-08 DIAGNOSIS — E669 Obesity, unspecified: Secondary | ICD-10-CM

## 2022-12-08 DIAGNOSIS — O09213 Supervision of pregnancy with history of pre-term labor, third trimester: Secondary | ICD-10-CM

## 2022-12-08 DIAGNOSIS — O10919 Unspecified pre-existing hypertension complicating pregnancy, unspecified trimester: Secondary | ICD-10-CM

## 2022-12-08 DIAGNOSIS — O360130 Maternal care for anti-D [Rh] antibodies, third trimester, not applicable or unspecified: Secondary | ICD-10-CM

## 2022-12-08 DIAGNOSIS — Z3A28 28 weeks gestation of pregnancy: Secondary | ICD-10-CM

## 2022-12-08 NOTE — Progress Notes (Signed)
   PRENATAL VISIT NOTE  Subjective:  Marilyn Cooper is a 32 y.o. [phone] at [redacted]w[redacted]d being seen today for ongoing prenatal care.  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  Patient reports  wakes during the night after she started insulin .  Contractions: Not present. Vag. Bleeding: None.  Movement: Present. Denies leaking of fluid.   The following portions of the patient's history were reviewed and updated as appropriate: allergies, current medications, past family history, past medical history, past social history, past surgical history and problem list.   Objective:   Vitals:   12/08/22 1047  BP: 124/79  Pulse: 91  Weight: 300 lb (136.1 kg)    Fetal Status: Fetal Heart Rate (bpm): 145   Movement: Present     General:  Alert, oriented and cooperative. Patient is in no acute distress.  Skin: Skin is warm and dry. No rash noted.   Cardiovascular: Normal heart rate noted  Respiratory: Normal respiratory effort, no problems with respiration noted  Abdomen: Soft, gravid, appropriate for gestational age.  Pain/Pressure: Present     Pelvic: Cervical exam deferred        Extremities: Normal range of motion.     Mental Status: Normal mood and affect. Normal behavior. Normal judgment and thought content.   Assessment and Plan:  Pregnancy: [id number] at [redacted]w[redacted]d 1. Supervision of high risk pregnancy, antepartum   2. [redacted] weeks gestation of pregnancy   3.  Chronic hypertension affecting pregnancy   4. Obesity affecting pregnancy, antepartum, unspecified obesity type   5. Pregnancy with type 2 diabetes mellitus in third trimester FBS 100-130 and PP <120. She may be dropping her BG during the night using insulin. She will stop insulin now and follow BG and her sx until f/u  6. hx c section x 4 Likely repeat at 37 weeks  Preterm labor symptoms and general obstetric precautions including but not limited to vaginal bleeding, contractions, leaking of fluid and fetal movement were reviewed in detail with the patient. Please refer to After Visit Summary for other counseling recommendations.   Return in about 2 weeks (around 12/22/2022).  Future Appointments  Date Time Provider Department Center  12/08/2022  1:15 PM [facility] [place] NURSE [facility]  12/08/2022  1:30 PM [facility] US2 [facility]  12/22/2022 10:15 AM [facility] NURSE [facility]  12/22/2022 10:30 AM [facility] US5 [facility]  12/22/2022  1:30 PM Adam Phenix, MD [facility] None  01/05/2023 10:35 AM Warden Fillers, MD [facility] None  01/05/2023  1:15 PM [facility] NURSE [facility]  01/05/2023  1:30 PM [facility] US2 [facility]  01/19/2023 10:35 AM Constant, Gigi Gin, MD [facility] None  02/02/2023 10:35 AM Constant, Gigi Gin, MD [facility] None  02/09/2023 10:35 AM Lennart Pall, MD [facility] None  02/16/2023 10:35 AM Lennart Pall, MD [facility] None    Scheryl Darter, MD

## 2022-12-08 NOTE — Progress Notes (Signed)
Pt states Novolin wasn't approved, only using Humulin. Pt states her fasting has elevated- 120s-130s. 2 hr readings are normal, no higher than 115.

## 2022-12-22 ENCOUNTER — Ambulatory Visit: Payer: BC Managed Care – PPO | Attending: Obstetrics and Gynecology

## 2022-12-22 ENCOUNTER — Ambulatory Visit: Payer: BC Managed Care – PPO | Admitting: *Deleted

## 2022-12-22 ENCOUNTER — Ambulatory Visit: Payer: BC Managed Care – PPO | Admitting: Obstetrics & Gynecology

## 2022-12-22 ENCOUNTER — Other Ambulatory Visit: Payer: Self-pay | Admitting: *Deleted

## 2022-12-22 ENCOUNTER — Other Ambulatory Visit: Payer: Self-pay

## 2022-12-22 VITALS — BP 143/75 | HR 92

## 2022-12-22 VITALS — BP 146/85 | HR 98 | Wt 294.0 lb

## 2022-12-22 DIAGNOSIS — O09293 Supervision of pregnancy with other poor reproductive or obstetric history, third trimester: Secondary | ICD-10-CM

## 2022-12-22 DIAGNOSIS — O10913 Unspecified pre-existing hypertension complicating pregnancy, third trimester: Secondary | ICD-10-CM

## 2022-12-22 DIAGNOSIS — Z7984 Long term (current) use of oral hypoglycemic drugs: Secondary | ICD-10-CM

## 2022-12-22 DIAGNOSIS — O0990 Supervision of high risk pregnancy, unspecified, unspecified trimester: Secondary | ICD-10-CM | POA: Diagnosis not present

## 2022-12-22 DIAGNOSIS — Z3A30 30 weeks gestation of pregnancy: Secondary | ICD-10-CM

## 2022-12-22 DIAGNOSIS — O24112 Pre-existing diabetes mellitus, type 2, in pregnancy, second trimester: Secondary | ICD-10-CM | POA: Insufficient documentation

## 2022-12-22 DIAGNOSIS — E669 Obesity, unspecified: Secondary | ICD-10-CM | POA: Diagnosis not present

## 2022-12-22 DIAGNOSIS — O99210 Obesity complicating pregnancy, unspecified trimester: Secondary | ICD-10-CM

## 2022-12-22 DIAGNOSIS — E1169 Type 2 diabetes mellitus with other specified complication: Secondary | ICD-10-CM | POA: Diagnosis not present

## 2022-12-22 DIAGNOSIS — O360920 Maternal care for other rhesus isoimmunization, second trimester, not applicable or unspecified: Secondary | ICD-10-CM

## 2022-12-22 DIAGNOSIS — O10013 Pre-existing essential hypertension complicating pregnancy, third trimester: Secondary | ICD-10-CM

## 2022-12-22 DIAGNOSIS — O360930 Maternal care for other rhesus isoimmunization, third trimester, not applicable or unspecified: Secondary | ICD-10-CM

## 2022-12-22 DIAGNOSIS — O99213 Obesity complicating pregnancy, third trimester: Secondary | ICD-10-CM | POA: Diagnosis not present

## 2022-12-22 DIAGNOSIS — O09213 Supervision of pregnancy with history of pre-term labor, third trimester: Secondary | ICD-10-CM

## 2022-12-22 DIAGNOSIS — O24113 Pre-existing diabetes mellitus, type 2, in pregnancy, third trimester: Secondary | ICD-10-CM

## 2022-12-22 DIAGNOSIS — I10 Essential (primary) hypertension: Secondary | ICD-10-CM

## 2022-12-22 DIAGNOSIS — O99212 Obesity complicating pregnancy, second trimester: Secondary | ICD-10-CM | POA: Diagnosis not present

## 2022-12-22 DIAGNOSIS — O24912 Unspecified diabetes mellitus in pregnancy, second trimester: Secondary | ICD-10-CM

## 2022-12-22 DIAGNOSIS — O34219 Maternal care for unspecified type scar from previous cesarean delivery: Secondary | ICD-10-CM

## 2022-12-22 MED ORDER — METFORMIN HCL 500 MG PO TABS: 1000.0000 mg | ORAL_TABLET | Freq: Every day | ORAL | 5 refills | Status: DC

## 2022-12-22 MED ORDER — LABETALOL HCL 200 MG PO TABS
400.0000 mg | ORAL_TABLET | Freq: Two times a day (BID) | ORAL | 0 refills | Status: DC
Start: 2022-12-22 — End: 2023-01-19

## 2022-12-22 MED ORDER — INSULIN NPH (HUMAN) (ISOPHANE) 100 UNIT/ML ~~LOC~~ SUSP: 8.0000 [IU] | Freq: Every day | SUBCUTANEOUS | 11 refills | Status: DC

## 2022-12-22 NOTE — Progress Notes (Signed)
PRENATAL VISIT NOTE  Subjective:  Marilyn Cooper is a 32 y.o. [id number] at [redacted]w[redacted]d being seen today for ongoing prenatal care.  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  Patient reports  cough improving .  Contractions: Not present. Vag. Bleeding: None.  Movement: Present. Denies leaking of fluid.   The following portions of the patient's history were reviewed and updated as appropriate: allergies, current medications, past family history, past medical history, past social history, past surgical history and problem list.   Objective:   Vitals:   12/22/22 1325  BP: (!) 146/85  Pulse: 98  Weight: 294 lb (133.4 kg)    Fetal Status:     Movement: Present     General:  Alert, oriented and cooperative. Patient is in no acute distress.  Skin: Skin is warm and dry. No rash noted.   Cardiovascular: Normal heart rate noted  Respiratory: Normal respiratory effort, no problems with respiration noted  Abdomen: Soft, gravid, appropriate for gestational age.  Pain/Pressure: Absent     Pelvic: Cervical exam deferred        Extremities: Normal range of motion.  Edema: None  Mental Status: Normal mood and affect. Normal behavior. Normal judgment and thought content.   Assessment and Plan:  Pregnancy: [id number] at [redacted]w[redacted]d 1. [redacted] weeks gestation of pregnancy Normal dopplers today  2. Pregnancy with type 2 diabetes mellitus in third trimester FBS 102, PP  110-120  3. Supervision of high risk pregnancy, antepartum   4. Obesity affecting pregnancy, antepartum, unspecified obesity type   5. Benign essential hypertension Increase labetalol  6. Diabetes mellitus affecting pregnancy in second trimester  - insulin NPH Human (NOVOLIN N) 100 UNIT/ML injection; Inject 0.08 mLs (8 Units total) into the skin at bedtime.  Dispense: 10 mL; Refill: 11 - metFORMIN (GLUCOPHAGE) 500 MG tablet; Take 2 tablets (1,000 mg total) by mouth daily with breakfast.  Dispense: 60 tablet; Refill: 5  7. 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 Please refer to After Visit Summary for other counseling recommendations.   Return in about 1 week (around 12/29/2022).  Future Appointments  Date Time Provider Department Center  12/28/2022 10:35 AM Adam Phenix, MD [facility] None  01/05/2023 10:35 AM Warden Fillers, MD [facility] None  01/05/2023  1:15 PM [facility]URSE [facility]  01/05/2023  1:30 PM [facility] US2 [facility]  01/19/2023 10:35 AM Constant, Peggy, MD [facility] None  01/19/2023  1:00 PM [facility] NURSE [facility]  01/19/2023  1:15 PM [facility] NST [facility]  01/26/2023  8:15 AM [facility] NURSE [facility]  01/26/2023  8:30 AM [facility] US5 [facility]  02/02/2023  8:30 AM [facility] NURSE [facility]  02/02/2023  8:45 AM [facility] NST [facility]  02/02/2023 10:35 AM Constant, Gigi Gin, MD [facility] None  02/09/2023 10:35 AM Lennart Pall, MD [facility] None  02/16/2023 10:35 AM Berton Lan,  Caren Hazy, MD [facility] None    Scheryl Darter, MD

## 2022-12-22 NOTE — Progress Notes (Addendum)
ROB: Denies any concerns   Muscle strain from coughing   Pt left MFM earlier, therefore no FHT's obtained

## 2022-12-28 ENCOUNTER — Ambulatory Visit (INDEPENDENT_AMBULATORY_CARE_PROVIDER_SITE_OTHER): Payer: BC Managed Care – PPO | Admitting: Obstetrics & Gynecology

## 2022-12-28 VITALS — BP 133/77 | HR 96 | Wt 298.6 lb

## 2022-12-28 DIAGNOSIS — F411 Generalized anxiety disorder: Secondary | ICD-10-CM

## 2022-12-28 DIAGNOSIS — O0990 Supervision of high risk pregnancy, unspecified, unspecified trimester: Secondary | ICD-10-CM

## 2022-12-28 DIAGNOSIS — O24113 Pre-existing diabetes mellitus, type 2, in pregnancy, third trimester: Secondary | ICD-10-CM

## 2022-12-28 DIAGNOSIS — Z3A31 31 weeks gestation of pregnancy: Secondary | ICD-10-CM

## 2022-12-28 DIAGNOSIS — O10013 Pre-existing essential hypertension complicating pregnancy, third trimester: Secondary | ICD-10-CM

## 2022-12-28 DIAGNOSIS — O99343 Other mental disorders complicating pregnancy, third trimester: Secondary | ICD-10-CM

## 2022-12-28 DIAGNOSIS — O10919 Unspecified pre-existing hypertension complicating pregnancy, unspecified trimester: Secondary | ICD-10-CM

## 2022-12-28 DIAGNOSIS — E119 Type 2 diabetes mellitus without complications: Secondary | ICD-10-CM

## 2022-12-28 MED ORDER — ESCITALOPRAM OXALATE 10 MG PO TABS
10.0000 mg | ORAL_TABLET | Freq: Every day | ORAL | 3 refills | Status: DC
Start: 2022-12-28 — End: 2023-03-15

## 2022-12-28 NOTE — Progress Notes (Signed)
   PRENATAL VISIT NOTE  Subjective:  Marilyn Cooper is a 32 y.o. [phone] at [redacted]w[redacted]d being seen today for ongoing prenatal care.  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  Patient reports  increased anxiety and mood issues, h/o anxiety on no treatment currently .  Contractions: Irregular. Vag. Bleeding: None.  Movement: Present. Denies leaking of fluid.   The following portions of the patient's history were reviewed and updated as appropriate: allergies, current medications, past family history, past medical history, past social history, past surgical history and problem list.   Objective:   Vitals:   12/28/22 1111  BP: 133/77  Pulse: 96  Weight: 298 lb 9.6 oz (135.4 kg)    Fetal Status: Fetal Heart Rate (bpm): 144   Movement: Present     General:  Alert, oriented and cooperative. Patient is in no acute distress.  Skin: Skin is warm and dry. No rash noted.   Cardiovascular: Normal heart rate noted  Respiratory: Normal respiratory effort, no problems with respiration noted  Abdomen: Soft, gravid, appropriate for gestational age.  Pain/Pressure: Absent     Pelvic: Cervical exam deferred        Extremities: Normal range of motion.  Edema: None  Mental Status: Normal mood and affect. Normal behavior. Normal judgment and thought content.   Assessment and Plan:  Pregnancy: [id number] at [redacted]w[redacted]d 1. Supervision of high risk pregnancy, antepartum Weekly  fetal testing 32 weeks  2. Pregnancy with type 2 diabetes mellitus in third trimester FBS in the 90's with current management  3. Chronic hypertension affecting pregnancy Well controled  4. Generalized anxiety disorder Restart Lexapro and refer for counseling - Ambulatory referral to [facility] - escitalopram (LEXAPRO) 10 MG tablet; Take 1 tablet (10 mg total) by mouth daily.  Dispense: 30 tablet; Refill: 3  Preterm labor symptoms and general obstetric precautions including but not limited to vaginal bleeding, contractions, leaking of fluid and fetal movement were reviewed in detail with the patient. Please refer to After Visit Summary for other counseling recommendations.   Return in about 1 week (around 01/04/2023).  Future Appointments  Date Time Provider Department Center  01/05/2023 10:35 AM Warden Fillers, MD [facility] None  01/05/2023  1:15 PM [facility] NURSE [facility]  01/05/2023  1:30 PM [facility] US2 [facility]  01/19/2023 10:35 AM Constant, Peggy, MD [facility] None  01/19/2023  1:00 PM [facility] NURSE [facility]  01/19/2023  1:15 PM [facility] NST [facility]  01/26/2023  8:15 AM [facility] NURSE [facility]  01/26/2023  8:30 AM [facility] US5 [facility]  02/02/2023  8:30 AM [facility] NURSE [facility]  02/02/2023  8:45 AM [facility] NST [facility]  02/02/2023 10:35 AM Constant, Gigi Gin, MD [facility] None  02/09/2023 10:35 AM Lennart Pall, MD [facility] None  02/16/2023 10:35 AM Lennart Pall, MD [facility] None    Scheryl Darter, MD

## 2023-01-05 ENCOUNTER — Telehealth (INDEPENDENT_AMBULATORY_CARE_PROVIDER_SITE_OTHER): Payer: BC Managed Care – PPO | Admitting: Obstetrics and Gynecology

## 2023-01-05 ENCOUNTER — Ambulatory Visit: Payer: Self-pay | Admitting: Licensed Clinical Social Worker

## 2023-01-05 ENCOUNTER — Telehealth: Payer: Self-pay

## 2023-01-05 ENCOUNTER — Ambulatory Visit: Payer: BC Managed Care – PPO

## 2023-01-05 VITALS — BP 131/74

## 2023-01-05 DIAGNOSIS — O360130 Maternal care for anti-D [Rh] antibodies, third trimester, not applicable or unspecified: Secondary | ICD-10-CM

## 2023-01-05 DIAGNOSIS — O24113 Pre-existing diabetes mellitus, type 2, in pregnancy, third trimester: Secondary | ICD-10-CM

## 2023-01-05 DIAGNOSIS — O360920 Maternal care for other rhesus isoimmunization, second trimester, not applicable or unspecified: Secondary | ICD-10-CM

## 2023-01-05 DIAGNOSIS — Z6841 Body Mass Index (BMI) 40.0 and over, adult: Secondary | ICD-10-CM

## 2023-01-05 DIAGNOSIS — O34219 Maternal care for unspecified type scar from previous cesarean delivery: Secondary | ICD-10-CM

## 2023-01-05 DIAGNOSIS — Z3A32 32 weeks gestation of pregnancy: Secondary | ICD-10-CM

## 2023-01-05 DIAGNOSIS — O10919 Unspecified pre-existing hypertension complicating pregnancy, unspecified trimester: Secondary | ICD-10-CM

## 2023-01-05 NOTE — Telephone Encounter (Signed)
Patient called - will not make ultrasound appointment today - has a sick child she needs to take to the doctor - I have moved her follow up ultrasound to next weeks appointment.  Dr. Darra Lis aware of this.

## 2023-01-05 NOTE — Progress Notes (Signed)
OBSTETRICS PRENATAL VIRTUAL VISIT ENCOUNTER NOTE  Provider location: [facility]   Patient location: Home  I connected with Marilyn Cooper on 01/05/23 at 10:35 AM EST by MyChart Video Encounter and verified that I am speaking with the correct person using two identifiers. I discussed the limitations, risks, security and privacy concerns of performing an evaluation and management service virtually and the availability of in person appointments. I also discussed with the patient that there may be a patient responsible charge related to this service. The patient expressed understanding and agreed to proceed. Subjective:  Marilyn Cooper is a 32 y.o. [id number] at [redacted]w[redacted]d being seen today for ongoing prenatal care.  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  Patient reports no complaints.  Contractions: Not present. Vag. Bleeding: None.  Movement: Present. Denies any leaking of fluid.   The following portions of the patient's history were reviewed and updated as appropriate: allergies, current medications, past family history, past medical history, past social history, past surgical history and problem list.   Objective:   Vitals:   01/05/23 1038  BP: 131/74    Fetal Status:     Movement: Present     General:  Alert, oriented and cooperative. Patient is in no acute distress.  Respiratory: Normal respiratory effort, no problems  with respiration noted  Mental Status: Normal mood and affect. Normal behavior. Normal judgment and thought content.  Rest of physical exam deferred due to type of encounter  Imaging: [place] MFM OB LIMITED Result Date: 12/22/2022 ----------------------------------------------------------------------  OBSTETRICS REPORT                       (Signed Final 12/22/2022 12:35 pm) ---------------------------------------------------------------------- Patient Info  ID #:       [id number]                          D.O.B.:  09/01/1990 (32 yrs)(F)  Name:       Marilyn Cooper                Visit Date: 12/22/2022 10:34 am ---------------------------------------------------------------------- Performed By  Attending:        Ma Rings MD         Ref. Address:     [address]                                                             [phone]  [place]                                                             [address]  Performed By:     Reinaldo Raddle            [facility]  Referred By:      [facility] ---------------------------------------------------------------------- Orders  #  Description                           Code        Ordered By  1  [place] MFM OB LIMITED                     76815.01    [facility]  2  [place] MFM MCA DOPPLER                    76821.01    [facility] ----------------------------------------------------------------------  #  Order #                     Accession #                Episode #  1  [id number]                   [id number]                 [id number]  2  [id number]                   [id number]                 [id number]  ---------------------------------------------------------------------- Indications  Anti-E isoimmunization affecting pregnancy     O36.0930  in 3rd trimester  Obesity complicating pregnancy, third          O99.213  trimester  Pre-existing diabetes, type 2, in pregnancy,   O24.113  third trimester  Hypertension - Chronic/Pre-existing            O10.019  (labetalol)  Previous cesarean delivery, antepartum (x4)    O34.219  Poor obstetric history: Previous  O09.299  preeclampsia (x2)  Poor obstetric history: Previous preterm       O09.219  delivery, antepartum (34w and 36w, both  C/S due to pre-eclampsia)  Fetal or maternal indication (History of       O35.8XX0  surgical repair of umbilical hernia)  Declined genetic screening  [redacted] weeks gestation of pregnancy                Z3A.30 ---------------------------------------------------------------------- Fetal Evaluation  Num Of Fetuses:         1  Fetal Heart Rate(bpm):  152  Cardiac Activity:       Observed  Presentation:           Cephalic  Placenta:               Anterior  P. Cord Insertion:      Previously seen  Amniotic Fluid  AFI FV:      Within normal limits  AFI Sum(cm)     %Tile       Largest Pocket(cm)  17.71           66          6.07  RUQ(cm)       RLQ(cm)       LUQ(cm)        LLQ(cm)  6.07          3.03          2.95           5.66 ---------------------------------------------------------------------- OB History  Gravidity:    9         Term:   2        Prem:   2        SAB:   1  TOP:          3       Ectopic:  0        Living: 4 ---------------------------------------------------------------------- Gestational Age  LMP:           30w 4d        Date:  05/22/22                   EDD:   02/26/23  Best:          30w 4d     Det. By:  LMP  (05/22/22)          EDD:   02/26/23 ---------------------------------------------------------------------- Anatomy  Diaphragm:             Appears normal         Bladder:                Appears normal  Stomach:                Appears normal, left                         sided ---------------------------------------------------------------------- Doppler - Fetal Vessels  Middle Cerebral Artery                                                       PSV   MoM                                                     (  cm/s)                                                      47.71  1.16 ---------------------------------------------------------------------- Comments  This patient was seen due to maternal obesity with a BMI of  58, pregestational diabetes treated with metformin, and  chronic hypertension treated with labetalol.  She has also  screened positive for anti-Duffy and anti-E antibodies.  Based on her Unity cell free DNA test, the fetus is predicted  to have the E antigen on it's red blood cells. The Duffy (Fya)  antigen was not detected.  She denies any problems since her last exam.  There was normal amniotic fluid noted on today's exam.  The peak systolic velocity of the middle cerebral artery was  less than 1.5 multiple of the median for her gestational age,  indicating that her fetus is not anemic at this time.  There were no signs of fetal hydrops noted on today's exam.  The patient was reassured by today's findings.  She will return in 2 weeks for another MCA Doppler study and  growth scan.  We will start weekly fetal testing at around 32 weeks. ----------------------------------------------------------------------                   Ma Rings, MD Electronically Signed Final Report   12/22/2022 12:35 pm ----------------------------------------------------------------------   [place] MFM MCA DOPPLER Result Date: 12/22/2022 ----------------------------------------------------------------------  OBSTETRICS REPORT                       (Signed Final 12/22/2022 12:35 pm) ---------------------------------------------------------------------- Patient Info  ID #:       [id number]                          D.O.B.:  08-14-1990 (32 yrs)(F)  Name:        Marilyn Cooper                Visit Date: 12/22/2022 10:34 am ---------------------------------------------------------------------- Performed By  Attending:        Ma Rings MD         Ref. Address:     [address]                                                             [address]                                                             [place]  [address]  Performed By:     Reinaldo Raddle            [facility]  Referred By:      [facility] ---------------------------------------------------------------------- Orders  #  Description                           Code        Ordered By  1  [place] MFM OB LIMITED                     76815.01    [facility]  2  [place] MFM MCA DOPPLER                    76821.01    [facility] ----------------------------------------------------------------------  #  Order #                     Accession #                Episode #  1  [id number]                   [id number]                 [id number]  2  [id number]                   [id number]                 [id number] ---------------------------------------------------------------------- Indications  Anti-E isoimmunization affecting pregnancy     O36.0930  in 3rd trimester  Obesity complicating pregnancy, third          O99.213  trimester  Pre-existing diabetes, type 2, in pregnancy,   O24.113  third trimester  Hypertension - Chronic/Pre-existing            O10.019  (labetalol)  Previous cesarean delivery, antepartum (x4)    O34.219  Poor obstetric history: Previous               O09.299  preeclampsia (x2)  Poor obstetric history: Previous preterm       O09.219  delivery,  antepartum (34w and 36w, both  C/S due to pre-eclampsia)  Fetal or maternal indication (History of       O35.8XX0  surgical repair of umbilical hernia)  Declined genetic screening  [redacted] weeks gestation of pregnancy                Z3A.30 ---------------------------------------------------------------------- Fetal Evaluation  Num Of Fetuses:         1  Fetal Heart Rate(bpm):  152  Cardiac Activity:       Observed  Presentation:           Cephalic  Placenta:               Anterior  P. Cord Insertion:      Previously seen  Amniotic Fluid  AFI FV:      Within normal limits  AFI Sum(cm)     %Tile       Largest Pocket(cm)  17.71           66          6.07  RUQ(cm)       RLQ(cm)       LUQ(cm)        LLQ(cm)  6.07          3.03          2.95           5.66 ---------------------------------------------------------------------- OB History  Gravidity:    9         Term:   2        Prem:   2        SAB:   1  TOP:          3       Ectopic:  0        Living: 4 ---------------------------------------------------------------------- Gestational Age  LMP:           30w 4d        Date:  05/22/22                   EDD:   02/26/23  Best:          30w 4d     Det. By:  LMP  (05/22/22)          EDD:   02/26/23 ---------------------------------------------------------------------- Anatomy  Diaphragm:             Appears normal         Bladder:                Appears normal  Stomach:               Appears normal, left                         sided ---------------------------------------------------------------------- Doppler - Fetal Vessels  Middle Cerebral Artery                                                       PSV   MoM                                                     (cm/s)  47.71  1.16 ---------------------------------------------------------------------- Comments  This patient was seen due to maternal obesity with a BMI of  58, pregestational diabetes treated with metformin, and   chronic hypertension treated with labetalol.  She has also  screened positive for anti-Duffy and anti-E antibodies.  Based on her Unity cell free DNA test, the fetus is predicted  to have the E antigen on it's red blood cells. The Duffy (Fya)  antigen was not detected.  She denies any problems since her last exam.  There was normal amniotic fluid noted on today's exam.  The peak systolic velocity of the middle cerebral artery was  less than 1.5 multiple of the median for her gestational age,  indicating that her fetus is not anemic at this time.  There were no signs of fetal hydrops noted on today's exam.  The patient was reassured by today's findings.  She will return in 2 weeks for another MCA Doppler study and  growth scan.  We will start weekly fetal testing at around 32 weeks. ----------------------------------------------------------------------                   Ma Rings, MD Electronically Signed Final Report   12/22/2022 12:35 pm ----------------------------------------------------------------------   [place] MFM OB FOLLOW UP Result Date: 12/08/2022 ----------------------------------------------------------------------  OBSTETRICS REPORT                       (Signed Final 12/08/2022 03:07 pm) ---------------------------------------------------------------------- Patient Info  ID #:       [id number]                          D.O.B.:  12-28-1990 (32 yrs)(F)  Name:       Marilyn Cooper                Visit Date: 12/08/2022 01:23 pm ---------------------------------------------------------------------- Performed By  Attending:        Noralee Space MD        Ref. Address:     [address]                                                             [address]                                                             [place]  [address]  Performed By:     Reinaldo Raddle            [facility]  Referred By:      [facility] ---------------------------------------------------------------------- Orders  #  Description                           Code        Ordered By  1  [place] MFM OB FOLLOW UP                   [phone]    YU FANG  2  [place] MFM MCA DOPPLER                    [id number]    YU FANG ----------------------------------------------------------------------  #  Order #                     Accession #                Episode #  1  [id number]                   [id number]                 [id number]  2  [id number]                   [id number]                 [id number] ---------------------------------------------------------------------- Indications  Obesity complicating pregnancy, second         O99.212  trimester (BMI 58)  Pre-existing diabetes, type 2, in pregnancy,   O24.112  second trimester (Metformin)  Hypertension - Chronic/Pre-existing            O10.019  (labetalol)  Anti-E isoimmunization affecting pregnancy     O36.0920  in 2nd trimester  Short interval between pregnancies, 2nd        O09.892  trimester (delivered 05/04/21)  Maternal care for Anti-Fy (a) Duffy            O36.0120  antibodies, second trimester, unspecified  Previous cesarean delivery, antepartum (x4)    O34.219  Poor obstetric history: Previous               O09.299  preeclampsia (x2)  Poor  obstetric history: Previous preterm       O09.219  delivery, antepartum (34w and 36w, both  C/S due to pre-eclampsia)  Fetal or maternal indication (History of       O35.8XX0  surgical repair of umbilical hernia)  [redacted] weeks gestation of pregnancy                Z3A.28  Declined genetic screening ---------------------------------------------------------------------- Fetal Evaluation  Num Of Fetuses:         1  Fetal  Heart Rate(bpm):  162  Cardiac Activity:       Observed  Presentation:           Cephalic  Placenta:               Anterior  P. Cord Insertion:      Previously seen  Amniotic Fluid  AFI FV:      Within normal limits  AFI Sum(cm)     %Tile       Largest Pocket(cm)  15.12           53          9.92  RUQ(cm)       RLQ(cm)       LUQ(cm)        LLQ(cm)  0.75          9.92          0              4.45 ---------------------------------------------------------------------- Biometry  BPD:      71.4  mm     G. Age:  28w 5d         41  %    CI:        74.04   %    70 - 86                                                          FL/HC:      18.9   %    19.6 - 20.8  HC:      263.5  mm     G. Age:  28w 5d         21  %    HC/AC:      1.00        0.99 - 1.21  AC:      263.8  mm     G. Age:  30w 4d         91  %    FL/BPD:     69.6   %    71 - 87  FL:       49.7  mm     G. Age:  26w 5d        3.5  %    FL/AC:      18.8   %    20 - 24  CER:      34.6  mm     G. Age:  29w 1d         68  %  LV:        5.5  mm  CM:        8.3  mm  Est. FW:    1319  gm    2 lb 15 oz  53  % ---------------------------------------------------------------------- OB History  Gravidity:    9         Term:   2        Prem:   2        SAB:   1  TOP:          3       Ectopic:  0        Living: 4 ---------------------------------------------------------------------- Gestational Age  LMP:           28w 4d        Date:  05/22/22                   EDD:   02/26/23  U/S Today:     28w 5d                                        EDD:   02/25/23  Best:          28w 4d     Det. By:  LMP  (05/22/22)          EDD:   02/26/23 ---------------------------------------------------------------------- Anatomy  Cranium:               Appears normal         LVOT:                   Not well visualized  Cavum:                 Appears normal         Aortic Arch:            Not well visualized  Ventricles:            Appears normal         Ductal Arch:            Not well visualized   Choroid Plexus:        Appears normal         Diaphragm:              Appears normal  Cerebellum:            Appears normal         Stomach:                Appears normal, left                                                                        sided  Posterior Fossa:       Appears normal         Abdomen:                Appears normal  Nuchal Fold:           Not well visualized    Abdominal Wall:         Previously seen  Face:                  Appears normal         Cord Vessels:  Previously seen                         (orbits and profile)  Lips:                  Appears normal         Kidneys:                Appear normal  Palate:                Not well visualized    Bladder:                Appears normal  Thoracic:              Appears normal         Spine:                  Not well visualized  Heart:                 Previously seen        Upper Extremities:      Previously seen  RVOT:                  Appears normal         Lower Extremities:      Previously seen  Other:  Technically difficult due to maternal habitus and fetal position. Fetus          appears to be female. Heels and Feet prev visualized. ---------------------------------------------------------------------- Doppler - Fetal Vessels  Middle Cerebral Artery                                                       PSV   MoM                                                     (cm/s)                                                      50.15  1.34 ---------------------------------------------------------------------- Cervix Uterus Adnexa  Cervix  Not visualized (advanced GA >24wks) ---------------------------------------------------------------------- Impression  Pregestational diabetes.  Chronic hypertension.  Patient takes labetalol.  Blood  pressure today at our office is 144/74 mmHg.  Anti-Fya (Duffy) antibodies. Too weak to titer.  Her husband  screened positive for Fya and Fyb antigens (in her previous  pregnancy). Not screened for E  antigen.  Anti-E antibodies (too weak to titer).  [facility] cell-free fetal DNA screening showed fetus has E-  antigen but does not have Fya antigen.  Fetal growth is appropriate for gestational age. Amniotic fluid  is normal good fetal activity seen.  Middle cerebral artery  Doppler showed normal peak systolic velocity measurements  (no evidence of fetal anemia).  We reassured the patient of the findings. ---------------------------------------------------------------------- Recommendations  MCA Doppler in 2 weeks. ----------------------------------------------------------------------  Noralee Space, MD Electronically Signed Final Report   12/08/2022 03:07 pm ----------------------------------------------------------------------   [place] MFM MCA DOPPLER Result Date: 12/08/2022 ----------------------------------------------------------------------  OBSTETRICS REPORT                       (Signed Final 12/08/2022 03:07 pm) ---------------------------------------------------------------------- Patient Info  ID #:       [id number]                          D.O.B.:  1990-11-14 (32 yrs)(F)  Name:       Marilyn Cooper                Visit Date: 12/08/2022 01:23 pm ---------------------------------------------------------------------- Performed By  Attending:        Noralee Space MD        Ref. Address:     [address]                                                             [address]                                                             [place]                                                             [address]  Performed By:     Reinaldo Raddle            [facility]  Referred By:      [facility]  ---------------------------------------------------------------------- Orders  #  Description                           Code        Ordered By  1  [place] MFM OB FOLLOW UP                   [phone]    YU FANG  2  [place] MFM MCA DOPPLER                    [phone]    YU FANG ----------------------------------------------------------------------  #  Order #                     Accession #                Episode #  1  [id number]                   [id number]                 [id number]  2  [id number]                   [id number]                 [id number] ---------------------------------------------------------------------- Indications  Obesity complicating pregnancy, second         O99.212  trimester (BMI 58)  Pre-existing diabetes, type 2, in pregnancy,   O24.112  second trimester (Metformin)  Hypertension - Chronic/Pre-existing            O10.019  (labetalol)  Anti-E isoimmunization affecting pregnancy     O36.0920  in 2nd trimester  Short interval between pregnancies, 2nd        O09.892  trimester (delivered 05/04/21)  Maternal care for Anti-Fy (a) Duffy            O36.0120  antibodies, second trimester, unspecified  Previous cesarean delivery, antepartum (x4)    O34.219  Poor obstetric history: Previous               O09.299  preeclampsia (x2)  Poor obstetric history: Previous preterm       O09.219  delivery, antepartum (34w and 36w, both  C/S due to pre-eclampsia)  Fetal or maternal indication (History of       O35.8XX0  surgical repair of umbilical hernia)  [redacted] weeks gestation of pregnancy                Z3A.28  Declined genetic screening ---------------------------------------------------------------------- Fetal Evaluation  Num Of Fetuses:         1  Fetal Heart Rate(bpm):  162  Cardiac Activity:       Observed  Presentation:           Cephalic  Placenta:               Anterior  P. Cord Insertion:      Previously seen  Amniotic Fluid  AFI FV:      Within normal limits  AFI Sum(cm)     %Tile       Largest Pocket(cm)  15.12            53          9.92  RUQ(cm)       RLQ(cm)       LUQ(cm)  LLQ(cm)  0.75          9.92          0              4.45 ---------------------------------------------------------------------- Biometry  BPD:      71.4  mm     G. Age:  28w 5d         41  %    CI:        74.04   %    70 - 86                                                          FL/HC:      18.9   %    19.6 - 20.8  HC:      263.5  mm     G. Age:  28w 5d         21  %    HC/AC:      1.00        0.99 - 1.21  AC:      263.8  mm     G. Age:  30w 4d         91  %    FL/BPD:     69.6   %    71 - 87  FL:       49.7  mm     G. Age:  26w 5d        3.5  %    FL/AC:      18.8   %    20 - 24  CER:      34.6  mm     G. Age:  29w 1d         68  %  LV:        5.5  mm  CM:        8.3  mm  Est. FW:    1319  gm    2 lb 15 oz      53  % ---------------------------------------------------------------------- OB History  Gravidity:    9         Term:   2        Prem:   2        SAB:   1  TOP:          3       Ectopic:  0        Living: 4 ---------------------------------------------------------------------- Gestational Age  LMP:           28w 4d        Date:  05/22/22                   EDD:   02/26/23  U/S Today:     28w 5d                                        EDD:   02/25/23  Best:          28w 4d     Det. By:  LMP  (05/22/22)          EDD:   02/26/23 ---------------------------------------------------------------------- Anatomy  Cranium:               Appears normal         LVOT:                   Not well visualized  Cavum:                 Appears normal         Aortic Arch:            Not well visualized  Ventricles:            Appears normal         Ductal Arch:            Not well visualized  Choroid Plexus:        Appears normal         Diaphragm:              Appears normal  Cerebellum:            Appears normal         Stomach:                Appears normal, left                                                                        sided  Posterior Fossa:        Appears normal         Abdomen:                Appears normal  Nuchal Fold:           Not well visualized    Abdominal Wall:         Previously seen  Face:                  Appears normal         Cord Vessels:           Previously seen                         (orbits and profile)  Lips:                  Appears normal         Kidneys:                Appear normal  Palate:                Not well visualized    Bladder:                Appears normal  Thoracic:              Appears normal         Spine:                  Not well visualized  Heart:                 Previously seen        Upper Extremities:      Previously seen  RVOT:  Appears normal         Lower Extremities:      Previously seen  Other:  Technically difficult due to maternal habitus and fetal position. Fetus          appears to be female. Heels and Feet prev visualized. ---------------------------------------------------------------------- Doppler - Fetal Vessels  Middle Cerebral Artery                                                       PSV   MoM                                                     (cm/s)                                                      50.15  1.34 ---------------------------------------------------------------------- Cervix Uterus Adnexa  Cervix  Not visualized (advanced GA >24wks) ---------------------------------------------------------------------- Impression  Pregestational diabetes.  Chronic hypertension.  Patient takes labetalol.  Blood  pressure today at our office is 144/74 mmHg.  Anti-Fya (Duffy) antibodies. Too weak to titer.  Her husband  screened positive for Fya and Fyb antigens (in her previous  pregnancy). Not screened for E antigen.  Anti-E antibodies (too weak to titer).  [facility] cell-free fetal DNA screening showed fetus has E-  antigen but does not have Fya antigen.  Fetal growth is appropriate for gestational age. Amniotic fluid  is normal good fetal activity seen.  Middle cerebral artery   Doppler showed normal peak systolic velocity measurements  (no evidence of fetal anemia).  We reassured the patient of the findings. ---------------------------------------------------------------------- Recommendations  MCA Doppler in 2 weeks. ----------------------------------------------------------------------                  Noralee Space, MD Electronically Signed Final Report   12/08/2022 03:07 pm ----------------------------------------------------------------------    Assessment and Plan:  Pregnancy: [id number] at [redacted]w[redacted]d 1. [redacted] weeks gestation of pregnancy (Primary)   2. Chronic hypertension affecting pregnancy Pt notes continued use of labetalol, baby ASA and weekly testing  3. Pregnancy with type 2 diabetes mellitus in third trimester Per pt FBS: 90s PPBS: 110s  4. 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 BMI 50.0-59.9, adult (HCC)   6. hx c section x 4 Pt to have repeat c/s #5 likely at 36-37 weeks pending fetal/maternal status  7. 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 I discussed the assessment and treatment plan with the patient. The patient was provided an opportunity to ask questions and all were answered. The patient agreed with the plan and demonstrated an understanding of the instructions. The patient was advised to call  back or seek an in-person office evaluation/[facility] to [facility] for any urgent or concerning symptoms. Please refer to After Visit Summary for other counseling recommendations.   I  provided 10 minutes of face-to-face time during this encounter.  Return in about 2 weeks (around 01/19/2023) for [facility], in person.  Future Appointments  Date Time Provider Department Center  01/09/2023  9:15 AM Russ Halo, LCSWA [facility] None  01/12/2023 11:15 AM [facility] NURSE [facility]  01/12/2023 11:30 AM [facility] US2 [facility]  01/19/2023 10:35 AM Constant, Peggy, MD [facility] None  01/19/2023  1:00 PM [facility] NURSE [facility]  01/19/2023  1:15 PM [facility] NST [facility]  01/26/2023  8:15 AM [facility] NURSE [facility]  01/26/2023  8:30 AM [facility] US5 [facility]  02/02/2023  8:30 AM [facility] NURSE [facility]  02/02/2023  8:45 AM [facility] NST [facility]  02/02/2023 10:35 AM Constant, Gigi Gin, MD [facility] None  02/09/2023 10:35 AM Lennart Pall, MD [facility] None  02/16/2023 10:35 AM Lennart Pall, MD [facility] None    Warden Fillers, MD [facility], [facility]

## 2023-01-05 NOTE — Progress Notes (Signed)
TC to patient for virtual visit- BP this morning 131/74. Reports BG in the 90s before meals and 110s-120s after meals.  Wants to discuss induction timeline.

## 2023-01-09 ENCOUNTER — Ambulatory Visit: Payer: Self-pay | Admitting: Licensed Clinical Social Worker

## 2023-01-09 DIAGNOSIS — F4323 Adjustment disorder with mixed anxiety and depressed mood: Secondary | ICD-10-CM

## 2023-01-09 NOTE — BH Specialist Note (Signed)
Integrated Behavioral Health via Telemedicine Visit  01/09/2023 Marilyn Cooper [id number]  Number of Integrated Behavioral Health Clinician visits: 1- Initial Visit  Session Start time: 0915   Session End time: 1009  Total time in minutes: 54   Referring Provider: Dr. Debroah Loop  Patient/Family location: At [facility] Provider location: [facility] All persons participating in visit: Patient and [facility] Types of Service: Individual psychotherapy  I connected with Marilyn Cooper and/or Marilyn Cooper's patient via  Telephone or Engineer, civil (consulting)  (Video is Caregility application) and verified that I am speaking with the correct person using two identifiers. Discussed confidentiality: Yes   I discussed the limitations of telemedicine and the availability of in person appointments.  Discussed there is a possibility of technology failure and discussed alternative modes of communication if that failure occurs.  I discussed that engaging in this telemedicine visit, they consent to the provision of behavioral healthcare and the services will be billed under their insurance.  Patient and/or legal guardian expressed understanding and consented to Telemedicine visit: Yes   Presenting Concerns: Patient and/or family reports the following symptoms/concerns: Increased depression and anxiety symptoms, conflict with biological mother.  Duration of problem: Months; Severity of problem: moderate  Patient and/or Family's Strengths/Protective Factors: Social and Emotional competence, Concrete supports in place (healthy food, safe environments, etc.), and Physical Health (exercise, healthy diet, medication compliance, etc.)  Goals Addressed: Patient will:  Reduce symptoms of: anxiety and depression   Increase knowledge and/or ability of: coping skills and healthy habits   Demonstrate ability to: Increase healthy adjustment to current life circumstances  Progress towards  Goals: Ongoing  Interventions: Interventions utilized:  Solution-Focused Strategies, Supportive Counseling, Psychoeducation and/or Health Education, Communication Skills, and Supportive Reflection Standardized Assessments completed: Not Needed  Patient and/or Family Response: Patient reports she is currently pregnant with her fifth child, with an estimated due date at the end of January. She has been married for 14 years and has a longstanding history of depression and anxiety. Patient reports she is currently taking Lexapro and feels it is effective in managing her symptoms. She denies noncompliance with medications.  Patient expresses increased depressive and anxiety symptoms, which she attributes to recognizing she was not mentally prepared for her baby's arrival. Patient identifies additional stressors, including her husband's recent job loss and other personal adjustments, as contributing factors. Patient also reports experiencing anxiety about raising five children and how life will change with the new baby.  She discussed challenges in her relationship with her mother, including struggles with setting healthy boundaries. She shared that her mother's unsolicited advice and critical comments often upset her and she acknowledges the importance of establishing and maintaining appropriate boundaries within her relationships.    Assessment: Patient currently experiencing increased symptoms of depression and anxiety due to situational stressors, including her husbands recent job loss, the impending birth of her fifth baby and challenges setting healthy boundaries with her mother. She feels unprepared for the baby's arrival and is overwhelmed by the thought of raising five children. Despite these stressors, she demonstrates insight into her challenges and a willingness to work towards creating healthier boundaries within her family  system. .   Patient may benefit from continued support of integrated  behavioral health to support healthy adjustment and decrease symptoms.  Plan: Follow up with behavioral health clinician on : 01/23/2023 Behavioral recommendations: Continue working on setting healthy boundaries. Instead of saying No, you can say "Not at this time, that is not acceptable,  please dont do that, what does not work for you; works for me, not at this time, thank you for your advice; I'll be sure to ask you when I need it". Continue with medication management. Implement stress free techniques such as mindfulness and relaxation exercises.  Referral(s): Integrated [employer] (In Clinic)  I discussed the assessment and treatment plan with the patient and/or parent/guardian. They were provided an opportunity to ask questions and all were answered. They agreed with the plan and demonstrated an understanding of the instructions.   They were advised to call back or seek an in-person evaluation if the symptoms worsen or if the condition fails to improve as anticipated.  Marilyn Cooper, LCSWA

## 2023-01-12 ENCOUNTER — Ambulatory Visit: Payer: BC Managed Care – PPO | Attending: Obstetrics

## 2023-01-12 ENCOUNTER — Ambulatory Visit: Payer: BC Managed Care – PPO | Admitting: *Deleted

## 2023-01-12 ENCOUNTER — Other Ambulatory Visit: Payer: Self-pay

## 2023-01-12 ENCOUNTER — Encounter: Payer: Self-pay | Admitting: *Deleted

## 2023-01-12 VITALS — BP 147/78 | HR 87

## 2023-01-12 DIAGNOSIS — O99213 Obesity complicating pregnancy, third trimester: Secondary | ICD-10-CM | POA: Diagnosis not present

## 2023-01-12 DIAGNOSIS — O24113 Pre-existing diabetes mellitus, type 2, in pregnancy, third trimester: Secondary | ICD-10-CM | POA: Diagnosis not present

## 2023-01-12 DIAGNOSIS — Z3A33 33 weeks gestation of pregnancy: Secondary | ICD-10-CM

## 2023-01-12 DIAGNOSIS — O24112 Pre-existing diabetes mellitus, type 2, in pregnancy, second trimester: Secondary | ICD-10-CM | POA: Insufficient documentation

## 2023-01-12 DIAGNOSIS — O99212 Obesity complicating pregnancy, second trimester: Secondary | ICD-10-CM | POA: Diagnosis not present

## 2023-01-12 DIAGNOSIS — O360920 Maternal care for other rhesus isoimmunization, second trimester, not applicable or unspecified: Secondary | ICD-10-CM | POA: Insufficient documentation

## 2023-01-12 DIAGNOSIS — O10913 Unspecified pre-existing hypertension complicating pregnancy, third trimester: Secondary | ICD-10-CM | POA: Insufficient documentation

## 2023-01-12 DIAGNOSIS — O09293 Supervision of pregnancy with other poor reproductive or obstetric history, third trimester: Secondary | ICD-10-CM

## 2023-01-12 DIAGNOSIS — O0990 Supervision of high risk pregnancy, unspecified, unspecified trimester: Secondary | ICD-10-CM

## 2023-01-12 DIAGNOSIS — O10013 Pre-existing essential hypertension complicating pregnancy, third trimester: Secondary | ICD-10-CM | POA: Diagnosis not present

## 2023-01-12 DIAGNOSIS — O360930 Maternal care for other rhesus isoimmunization, third trimester, not applicable or unspecified: Secondary | ICD-10-CM | POA: Diagnosis not present

## 2023-01-12 DIAGNOSIS — O34219 Maternal care for unspecified type scar from previous cesarean delivery: Secondary | ICD-10-CM

## 2023-01-12 DIAGNOSIS — E669 Obesity, unspecified: Secondary | ICD-10-CM

## 2023-01-19 ENCOUNTER — Other Ambulatory Visit: Payer: Self-pay

## 2023-01-19 ENCOUNTER — Ambulatory Visit: Payer: BC Managed Care – PPO | Admitting: Obstetrics and Gynecology

## 2023-01-19 ENCOUNTER — Ambulatory Visit: Payer: BC Managed Care – PPO

## 2023-01-19 ENCOUNTER — Ambulatory Visit: Payer: BC Managed Care – PPO | Attending: Obstetrics | Admitting: *Deleted

## 2023-01-19 ENCOUNTER — Ambulatory Visit: Payer: BC Managed Care – PPO | Admitting: *Deleted

## 2023-01-19 ENCOUNTER — Encounter: Payer: Self-pay | Admitting: Obstetrics and Gynecology

## 2023-01-19 VITALS — BP 152/81 | HR 82

## 2023-01-19 VITALS — BP 157/94 | HR 93 | Wt 298.0 lb

## 2023-01-19 DIAGNOSIS — Z3A34 34 weeks gestation of pregnancy: Secondary | ICD-10-CM

## 2023-01-19 DIAGNOSIS — O99213 Obesity complicating pregnancy, third trimester: Secondary | ICD-10-CM | POA: Diagnosis not present

## 2023-01-19 DIAGNOSIS — O24113 Pre-existing diabetes mellitus, type 2, in pregnancy, third trimester: Secondary | ICD-10-CM

## 2023-01-19 DIAGNOSIS — O09293 Supervision of pregnancy with other poor reproductive or obstetric history, third trimester: Secondary | ICD-10-CM | POA: Insufficient documentation

## 2023-01-19 DIAGNOSIS — Z98891 History of uterine scar from previous surgery: Secondary | ICD-10-CM | POA: Diagnosis not present

## 2023-01-19 DIAGNOSIS — O10919 Unspecified pre-existing hypertension complicating pregnancy, unspecified trimester: Secondary | ICD-10-CM

## 2023-01-19 DIAGNOSIS — O99212 Obesity complicating pregnancy, second trimester: Secondary | ICD-10-CM

## 2023-01-19 DIAGNOSIS — Z79899 Other long term (current) drug therapy: Secondary | ICD-10-CM | POA: Insufficient documentation

## 2023-01-19 DIAGNOSIS — O0993 Supervision of high risk pregnancy, unspecified, third trimester: Secondary | ICD-10-CM

## 2023-01-19 DIAGNOSIS — O0990 Supervision of high risk pregnancy, unspecified, unspecified trimester: Secondary | ICD-10-CM

## 2023-01-19 DIAGNOSIS — O99210 Obesity complicating pregnancy, unspecified trimester: Secondary | ICD-10-CM

## 2023-01-19 DIAGNOSIS — Z23 Encounter for immunization: Secondary | ICD-10-CM | POA: Diagnosis not present

## 2023-01-19 DIAGNOSIS — O10013 Pre-existing essential hypertension complicating pregnancy, third trimester: Secondary | ICD-10-CM

## 2023-01-19 DIAGNOSIS — E1169 Type 2 diabetes mellitus with other specified complication: Secondary | ICD-10-CM | POA: Diagnosis not present

## 2023-01-19 DIAGNOSIS — O360920 Maternal care for other rhesus isoimmunization, second trimester, not applicable or unspecified: Secondary | ICD-10-CM

## 2023-01-19 DIAGNOSIS — O10913 Unspecified pre-existing hypertension complicating pregnancy, third trimester: Secondary | ICD-10-CM | POA: Diagnosis not present

## 2023-01-19 DIAGNOSIS — E669 Obesity, unspecified: Secondary | ICD-10-CM

## 2023-01-19 DIAGNOSIS — O24112 Pre-existing diabetes mellitus, type 2, in pregnancy, second trimester: Secondary | ICD-10-CM

## 2023-01-19 DIAGNOSIS — O09213 Supervision of pregnancy with history of pre-term labor, third trimester: Secondary | ICD-10-CM

## 2023-01-19 DIAGNOSIS — O34219 Maternal care for unspecified type scar from previous cesarean delivery: Secondary | ICD-10-CM

## 2023-01-19 MED ORDER — LABETALOL HCL 300 MG PO TABS: 600.0000 mg | ORAL_TABLET | Freq: Two times a day (BID) | ORAL | 1 refills | Status: DC

## 2023-01-19 NOTE — Progress Notes (Signed)
 PRENATAL VISIT NOTE  Subjective:  Marilyn Cooper is a 33 y.o. [id number] at [redacted]w[redacted]d being seen today for ongoing prenatal care.  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  Patient reports no complaints.  Contractions: Irritability. Vag. Bleeding: None.  Movement: Present. Denies leaking of fluid.   The following portions of the patient's history were reviewed and updated as appropriate: allergies, current medications, past family history, past medical history, past social history, past surgical history and problem list.   Objective:   Vitals:   01/19/23 1049 01/19/23 1056  BP: (!) 150/88 (!) 157/94  Pulse: 98 93  Weight: 298 lb (135.2 kg) 298 lb (135.2 kg)    Fetal Status: Fetal Heart Rate (bpm): 143   Movement: Present     General:  Alert, oriented and cooperative. Patient is in no acute distress.  Skin: Skin is warm and dry. No rash noted.   Cardiovascular: Normal heart rate noted  Respiratory: Normal respiratory effort, no problems with respiration noted  Abdomen: Soft, gravid, appropriate for gestational age.  Pain/Pressure: Present     Pelvic: Cervical exam deferred        Extremities: Normal range of motion.  Edema: None  Mental Status: Normal mood and affect. Normal behavior. Normal judgment and thought content.   Assessment and Plan:  Pregnancy: [id number] at [redacted]w[redacted]d 1. Supervision of high risk pregnancy, antepartum (Primary) Patient is  doing well without complaints - Tdap vaccine greater than or equal to 7yo IM  2. Pregnancy with type 2 diabetes mellitus in third trimester Patient did not bring CBG log and reports fasting in the low 90;s and pp less than 120.  Will continue current regimen Patient reports that CBG reading increases to 130's after her 2 hour meal which also corresponds to elevated BP reading. She admits to being under stress as husband recently lost employment for 3 weeks.   3. 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 hx c section x 4 Will plan for repeat c-section at 37 weeks pending MFM recommendations  5. Obesity affecting pregnancy, antepartum, unspecified obesity type   6. Chronic hypertension affecting pregnancy Patient reports taking extra doses of labetalol  when she notices elevated BP reading. Labetalol  changed to 600 mg BID. Patient was advised to increase to 600 mg TID if elevated BP consistently (rather than dosing prn as she has been doing)  7. [redacted] weeks gestation of pregnancy  - Tdap vaccine greater than or equal to 7yo IM  Preterm labor symptoms and general obstetric precautions including but not limited to vaginal bleeding, contractions, leaking of fluid and fetal movement were reviewed in detail with the patient. Please refer to After Visit Summary for other counseling recommendations.   Return in about 2 weeks (around 02/02/2023) for in person, ROB, High risk.  Future Appointments  Date Time Provider Department Center  01/19/2023  1:00 PM [facility] NURSE [facility]  01/19/2023  1:15 PM [facility] NST [facility]  01/23/2023  9:15 AM Sherrine,  Cquadayshia L, LCSWA [facility] None  01/26/2023  8:15 AM [facility] NURSE [facility]  01/26/2023  8:30 AM [facility] US5 [facility]  02/02/2023  8:30 AM [facility] NURSE [facility]  02/02/2023  8:45 AM [facility] NST [facility]   02/02/2023 10:35 AM Carlos Heber, MD [facility] None  02/09/2023 10:35 AM Erik Kieth BROCKS, MD [facility] None  02/10/2023  1:15 PM [facility] NURSE [facility]  02/10/2023  1:30 PM [facility] US4 [facility]  02/16/2023 10:35 AM Erik Kieth BROCKS, MD [facility] None    Winton Felt, MD

## 2023-01-19 NOTE — Procedures (Signed)
 Marilyn Cooper 05-02-1990 [redacted]w[redacted]d  Fetus A Non-Stress Test Interpretation for 01/19/23  Indication: Chronic Hypertenstion, DM  Fetal Heart Rate A Mode:  (US ) Baseline Rate (A): 140 bpm (audible FHR 140's with US -unable to monitor due to excessive FM and body habitus) Multiple birth?: No     Interpretation (Fetal Testing) Comments: Dr. Arna aware unable to monitor FHR for NST-BPP added

## 2023-01-19 NOTE — Progress Notes (Signed)
 ROB, Pt wants to change BP Rx.   Pt c/o BS spikes and back spasms.  NST is scheduled for 1 pm Today.

## 2023-01-21 ENCOUNTER — Other Ambulatory Visit: Payer: Self-pay | Admitting: Obstetrics & Gynecology

## 2023-01-21 DIAGNOSIS — Z30017 Encounter for initial prescription of implantable subdermal contraceptive: Secondary | ICD-10-CM

## 2023-01-21 DIAGNOSIS — Z98891 History of uterine scar from previous surgery: Secondary | ICD-10-CM

## 2023-01-21 DIAGNOSIS — F411 Generalized anxiety disorder: Secondary | ICD-10-CM

## 2023-01-23 ENCOUNTER — Telehealth (HOSPITAL_COMMUNITY): Payer: Self-pay | Admitting: *Deleted

## 2023-01-23 ENCOUNTER — Encounter (HOSPITAL_COMMUNITY): Payer: Self-pay

## 2023-01-23 ENCOUNTER — Other Ambulatory Visit: Payer: Self-pay | Admitting: Obstetrics and Gynecology

## 2023-01-23 ENCOUNTER — Other Ambulatory Visit: Payer: Self-pay | Admitting: *Deleted

## 2023-01-23 ENCOUNTER — Ambulatory Visit (INDEPENDENT_AMBULATORY_CARE_PROVIDER_SITE_OTHER): Payer: BC Managed Care – PPO | Admitting: Licensed Clinical Social Worker

## 2023-01-23 DIAGNOSIS — O99212 Obesity complicating pregnancy, second trimester: Secondary | ICD-10-CM

## 2023-01-23 DIAGNOSIS — O360920 Maternal care for other rhesus isoimmunization, second trimester, not applicable or unspecified: Secondary | ICD-10-CM

## 2023-01-23 DIAGNOSIS — O24112 Pre-existing diabetes mellitus, type 2, in pregnancy, second trimester: Secondary | ICD-10-CM

## 2023-01-23 DIAGNOSIS — O99213 Obesity complicating pregnancy, third trimester: Secondary | ICD-10-CM

## 2023-01-23 DIAGNOSIS — F4323 Adjustment disorder with mixed anxiety and depressed mood: Secondary | ICD-10-CM | POA: Diagnosis not present

## 2023-01-23 DIAGNOSIS — O10913 Unspecified pre-existing hypertension complicating pregnancy, third trimester: Secondary | ICD-10-CM

## 2023-01-23 NOTE — Telephone Encounter (Signed)
 Preadmission screen

## 2023-01-23 NOTE — BH Specialist Note (Signed)
 Integrated Behavioral Health via Telemedicine Visit  01/25/2023 Marilyn Cooper [id number]  Number of Integrated Behavioral Health Clinician visits: 2- Second Visit  Session Start time: 0915  Session End time: 0950  Total time in minutes: 35   Referring Provider: Dr. Eveline Patient/Family location: At [facility] Provider location: [facility] All persons participating in visit: Patient and [facility] Types of Service: Individual psychotherapy and Video visit  I connected with Marilyn Cooper and/or Marilyn Cooper's patient via  Telephone or Engineer, Civil (consulting)  (Video is Caregility application) and verified that I am speaking with the correct person using two identifiers. Discussed confidentiality: Yes   I discussed the limitations of telemedicine and the availability of in person appointments.  Discussed there is a possibility of technology failure and discussed alternative modes of communication if that failure occurs.  I discussed that engaging in this telemedicine visit, they consent to the provision of behavioral healthcare and the services will be billed under their insurance.  Patient and/or legal guardian expressed understanding and consented to Telemedicine visit: Yes   Presenting Concerns: Patient and/or family reports the following symptoms/concerns: Improvements with depression and anxiety Duration of problem: Months; Severity of problem: moderate  Patient and/or Family's Strengths/Protective Factors: Social and Emotional competence, Concrete supports in place (healthy food, safe environments, etc.), Physical Health (exercise, healthy diet, medication compliance, etc.), Caregiver has knowledge of parenting & child development, and Parental Resilience  Goals Addressed: Patient will:  Reduce symptoms of: anxiety and depression   Increase knowledge and/or ability of: coping skills and healthy habits   Demonstrate ability to: Increase healthy adjustment  to current life circumstances  Progress towards Goals: Ongoing  Interventions: Interventions utilized:  Solution-Focused Strategies, Supportive Counseling, Psychoeducation and/or Health Education, Communication Skills, and Supportive Reflection Standardized Assessments completed: Not Needed  Patient and/or Family Response: The patient attended today's virtual session and reported decreased symptoms of depression and anxiety. She expressed feeling more prepared for the arrival of her baby, having recently received a scheduled C-section date of January 20th. The patient shared that she and her husband have been actively working to complete tasks from their to-do list, which has given her a sense of accomplishment. The patient reported continued compliance with her prescribed medications for depression and anxiety. She also noted successfully setting healthy and appropriate boundaries with her mother and father, resulting in a positive impact on her overall symptoms. The patient described feeling more empowered and in control of her home environment and parenting responsibilities. The patient processed her progress in managing depression and anxiety symptoms and was provided with psychoeducation on boundary-setting styles. Strategies for implementing and maintaining boundaries across relationships were discussed, and the patient expressed eagerness to apply these strategies moving forward.   Assessment: Patient currently demonstrating progress in managing her symptoms and actively engaging in strategies to enhance her emotional and relational well-being. She appears motivated and empowered to make positive changes in her life. .   Patient may benefit from continued support of integrated behavioral health to support healthy adjustment and decrease symptoms.  .  Plan: Follow up with behavioral health clinician on : 02/03/23 at 5:30p Behavioral recommendations: [name] will continue to practice and  maintain healthy boundary setting with family and in other relationships to support emotional well-being. Continue medication management as prescribed and continue knocking out your to do list for preparation of baby's arrival. Referral(s): Integrated [employer] (In Clinic)  I discussed the assessment and treatment plan with the patient and/or  parent/guardian. They were provided an opportunity to ask questions and all were answered. They agreed with the plan and demonstrated an understanding of the instructions.   They were advised to call back or seek an in-person evaluation if the symptoms worsen or if the condition fails to improve as anticipated.  Marilyn Cooper Seats, LCSWA

## 2023-01-26 ENCOUNTER — Other Ambulatory Visit: Payer: Self-pay

## 2023-01-26 ENCOUNTER — Ambulatory Visit: Payer: BC Managed Care – PPO | Attending: Obstetrics

## 2023-01-26 ENCOUNTER — Ambulatory Visit: Payer: BC Managed Care – PPO

## 2023-01-26 VITALS — BP 148/74

## 2023-01-26 DIAGNOSIS — O24113 Pre-existing diabetes mellitus, type 2, in pregnancy, third trimester: Secondary | ICD-10-CM

## 2023-01-26 DIAGNOSIS — O10913 Unspecified pre-existing hypertension complicating pregnancy, third trimester: Secondary | ICD-10-CM | POA: Diagnosis not present

## 2023-01-26 DIAGNOSIS — O99213 Obesity complicating pregnancy, third trimester: Secondary | ICD-10-CM | POA: Insufficient documentation

## 2023-01-26 DIAGNOSIS — O10013 Pre-existing essential hypertension complicating pregnancy, third trimester: Secondary | ICD-10-CM | POA: Diagnosis not present

## 2023-01-26 DIAGNOSIS — Z3A35 35 weeks gestation of pregnancy: Secondary | ICD-10-CM

## 2023-01-26 DIAGNOSIS — O34219 Maternal care for unspecified type scar from previous cesarean delivery: Secondary | ICD-10-CM

## 2023-01-26 DIAGNOSIS — O360930 Maternal care for other rhesus isoimmunization, third trimester, not applicable or unspecified: Secondary | ICD-10-CM | POA: Insufficient documentation

## 2023-01-26 DIAGNOSIS — O09293 Supervision of pregnancy with other poor reproductive or obstetric history, third trimester: Secondary | ICD-10-CM

## 2023-01-26 DIAGNOSIS — E669 Obesity, unspecified: Secondary | ICD-10-CM

## 2023-01-26 DIAGNOSIS — O09213 Supervision of pregnancy with history of pre-term labor, third trimester: Secondary | ICD-10-CM

## 2023-01-31 ENCOUNTER — Other Ambulatory Visit: Payer: Self-pay | Admitting: Family Medicine

## 2023-01-31 DIAGNOSIS — O34219 Maternal care for unspecified type scar from previous cesarean delivery: Secondary | ICD-10-CM

## 2023-02-02 ENCOUNTER — Other Ambulatory Visit (HOSPITAL_COMMUNITY)
Admission: RE | Admit: 2023-02-02 | Discharge: 2023-02-02 | Disposition: A | Discharge status: Home / Self Care | Payer: BC Managed Care – PPO | Source: Ambulatory Visit | Attending: Obstetrics and Gynecology | Admitting: Obstetrics and Gynecology

## 2023-02-02 ENCOUNTER — Ambulatory Visit: Payer: BC Managed Care – PPO | Admitting: *Deleted

## 2023-02-02 ENCOUNTER — Ambulatory Visit (INDEPENDENT_AMBULATORY_CARE_PROVIDER_SITE_OTHER): Payer: BC Managed Care – PPO | Admitting: Obstetrics and Gynecology

## 2023-02-02 ENCOUNTER — Encounter: Payer: Self-pay | Admitting: Obstetrics and Gynecology

## 2023-02-02 VITALS — BP 143/81

## 2023-02-02 VITALS — BP 156/83 | HR 92

## 2023-02-02 VITALS — BP 149/95 | HR 93 | Wt 261.1 lb

## 2023-02-02 DIAGNOSIS — O10013 Pre-existing essential hypertension complicating pregnancy, third trimester: Secondary | ICD-10-CM

## 2023-02-02 DIAGNOSIS — E669 Obesity, unspecified: Secondary | ICD-10-CM

## 2023-02-02 DIAGNOSIS — Z3A36 36 weeks gestation of pregnancy: Secondary | ICD-10-CM | POA: Insufficient documentation

## 2023-02-02 DIAGNOSIS — E119 Type 2 diabetes mellitus without complications: Secondary | ICD-10-CM | POA: Insufficient documentation

## 2023-02-02 DIAGNOSIS — O0990 Supervision of high risk pregnancy, unspecified, unspecified trimester: Secondary | ICD-10-CM

## 2023-02-02 DIAGNOSIS — O24113 Pre-existing diabetes mellitus, type 2, in pregnancy, third trimester: Secondary | ICD-10-CM | POA: Insufficient documentation

## 2023-02-02 DIAGNOSIS — O99213 Obesity complicating pregnancy, third trimester: Secondary | ICD-10-CM | POA: Insufficient documentation

## 2023-02-02 DIAGNOSIS — O34219 Maternal care for unspecified type scar from previous cesarean delivery: Secondary | ICD-10-CM

## 2023-02-02 DIAGNOSIS — O99210 Obesity complicating pregnancy, unspecified trimester: Secondary | ICD-10-CM

## 2023-02-02 DIAGNOSIS — O10919 Unspecified pre-existing hypertension complicating pregnancy, unspecified trimester: Secondary | ICD-10-CM

## 2023-02-02 NOTE — Progress Notes (Signed)
Pt presents for ROB. No questions or concerns. Pt has been checking CBGs. Normal values per pt.

## 2023-02-02 NOTE — Procedures (Signed)
Marilyn Cooper October 06, 1990 [redacted]w[redacted]d  Fetus A Non-Stress Test Interpretation for 02/02/23 (NST only)  Indication: Diabetes and Morbid obesity  Fetal Heart Rate A Mode: External Baseline Rate (A): 150 bpm Variability: Moderate Accelerations: 15 x 15 Decelerations: None Multiple birth?: No  Uterine Activity Mode: Palpation Contraction Frequency (min): None Resting Tone Palpated: Relaxed Resting Time: Adequate  Interpretation (Fetal Testing) Nonstress Test Interpretation: Reactive Comments: Dr. Parke Poisson reviewed tracing.

## 2023-02-02 NOTE — Progress Notes (Signed)
   PRENATAL VISIT NOTE  Subjective:  Marilyn Cooper is a 33 y.o. [id number] at [redacted]w[redacted]d being seen today for ongoing prenatal care.  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  Patient reports no complaints.  Contractions: Not present. Vag. Bleeding: None.  Movement: Present. Denies leaking of fluid.   The following portions of the patient's history were reviewed and updated as appropriate: allergies, current medications, past family history, past medical history, past social history, past surgical history and problem list.   Objective:   Vitals:   02/02/23 1028 02/02/23 1034  BP: (!) 151/86 (!) 149/95  Pulse: 93   Weight: 261 lb 1.6 oz (118.4 kg)     Fetal Status:     Movement: Present     General:  Alert, oriented and cooperative. Patient is in no acute distress.  Skin: Skin is warm and dry. No rash noted.   Cardiovascular: Normal heart rate noted  Respiratory: Normal respiratory effort, no problems with respiration noted  Abdomen: Soft, gravid, appropriate for gestational age.  Pain/Pressure: Present     Pelvic: Cervical exam deferred        Extremities: Normal range of motion.  Edema: Trace  Mental Status: Normal mood and affect. Normal behavior. Normal judgment and thought content.   Assessment and Plan:  Pregnancy: [id number] at [redacted]w[redacted]d 1. Supervision of high risk pregnancy, antepartum (Primary) Patient is doing well without complaints Cultures  today  2. [redacted] weeks gestation of pregnancy   3. Chronic hypertension affecting pregnancy Patient took BP medication prior to appointment Will continue with TID dosing   4. Pregnancy with type 2 diabetes mellitus in third trimester Patient did not bring CBG log and reports all values within range without any more spiking values Continue insulin at current dosing NST reactive with MFM this morning  5. hx c section x 4 Scheduled for repeat on 02/06/23  6. Obesity affecting pregnancy, antepartum, unspecified obesity type BPP 8/8/ on 1/9  Preterm labor symptoms and general obstetric precautions including but not limited to vaginal bleeding, contractions, leaking of fluid and fetal movement were reviewed in detail with the patient. Please refer to After Visit Summary for other counseling recommendations.   Return in about 11 days (around 02/13/2023).  Future Appointments  Date Time Provider Department Center  02/03/2023  9:30 AM [facility] 1 [facility] None  02/03/2023  5:30 PM Russ Halo, LCSWA [facility] None  02/09/2023 10:35 AM Lennart Pall, MD [facility] None  02/16/2023 10:35 AM Lennart Pall, MD [facility] None    Catalina Antigua, MD

## 2023-02-03 ENCOUNTER — Encounter (HOSPITAL_COMMUNITY)
Admission: RE | Admit: 2023-02-03 | Discharge: 2023-02-03 | Disposition: A | Discharge status: Home / Self Care | Payer: BC Managed Care – PPO | Source: Ambulatory Visit | Attending: Obstetrics & Gynecology | Admitting: Obstetrics & Gynecology

## 2023-02-03 ENCOUNTER — Ambulatory Visit (INDEPENDENT_AMBULATORY_CARE_PROVIDER_SITE_OTHER): Payer: BC Managed Care – PPO | Admitting: Licensed Clinical Social Worker

## 2023-02-03 DIAGNOSIS — Z4682 Encounter for fitting and adjustment of non-vascular catheter: Secondary | ICD-10-CM | POA: Diagnosis not present

## 2023-02-03 DIAGNOSIS — O360930 Maternal care for other rhesus isoimmunization, third trimester, not applicable or unspecified: Secondary | ICD-10-CM | POA: Diagnosis not present

## 2023-02-03 DIAGNOSIS — F4323 Adjustment disorder with mixed anxiety and depressed mood: Secondary | ICD-10-CM | POA: Diagnosis not present

## 2023-02-03 DIAGNOSIS — Z01812 Encounter for preprocedural laboratory examination: Secondary | ICD-10-CM | POA: Insufficient documentation

## 2023-02-03 DIAGNOSIS — Z833 Family history of diabetes mellitus: Secondary | ICD-10-CM | POA: Diagnosis not present

## 2023-02-03 DIAGNOSIS — O99284 Endocrine, nutritional and metabolic diseases complicating childbirth: Secondary | ICD-10-CM | POA: Diagnosis not present

## 2023-02-03 DIAGNOSIS — R109 Unspecified abdominal pain: Secondary | ICD-10-CM | POA: Diagnosis not present

## 2023-02-03 DIAGNOSIS — O34211 Maternal care for low transverse scar from previous cesarean delivery: Secondary | ICD-10-CM | POA: Diagnosis not present

## 2023-02-03 DIAGNOSIS — Z888 Allergy status to other drugs, medicaments and biological substances status: Secondary | ICD-10-CM | POA: Diagnosis not present

## 2023-02-03 DIAGNOSIS — Z8249 Family history of ischemic heart disease and other diseases of the circulatory system: Secondary | ICD-10-CM | POA: Diagnosis not present

## 2023-02-03 DIAGNOSIS — O34219 Maternal care for unspecified type scar from previous cesarean delivery: Secondary | ICD-10-CM | POA: Insufficient documentation

## 2023-02-03 DIAGNOSIS — R110 Nausea: Secondary | ICD-10-CM | POA: Diagnosis not present

## 2023-02-03 DIAGNOSIS — O24424 Gestational diabetes mellitus in childbirth, insulin controlled: Secondary | ICD-10-CM | POA: Diagnosis not present

## 2023-02-03 DIAGNOSIS — R188 Other ascites: Secondary | ICD-10-CM | POA: Diagnosis not present

## 2023-02-03 DIAGNOSIS — O9962 Diseases of the digestive system complicating childbirth: Secondary | ICD-10-CM | POA: Diagnosis not present

## 2023-02-03 DIAGNOSIS — K56600 Partial intestinal obstruction, unspecified as to cause: Secondary | ICD-10-CM | POA: Diagnosis not present

## 2023-02-03 DIAGNOSIS — Z3A37 37 weeks gestation of pregnancy: Secondary | ICD-10-CM | POA: Diagnosis not present

## 2023-02-03 DIAGNOSIS — E119 Type 2 diabetes mellitus without complications: Secondary | ICD-10-CM | POA: Diagnosis not present

## 2023-02-03 DIAGNOSIS — K56609 Unspecified intestinal obstruction, unspecified as to partial versus complete obstruction: Secondary | ICD-10-CM | POA: Diagnosis not present

## 2023-02-03 DIAGNOSIS — O99214 Obesity complicating childbirth: Secondary | ICD-10-CM | POA: Diagnosis not present

## 2023-02-03 DIAGNOSIS — R162 Hepatomegaly with splenomegaly, not elsewhere classified: Secondary | ICD-10-CM | POA: Diagnosis not present

## 2023-02-03 DIAGNOSIS — O99344 Other mental disorders complicating childbirth: Secondary | ICD-10-CM | POA: Diagnosis not present

## 2023-02-03 DIAGNOSIS — O115 Pre-existing hypertension with pre-eclampsia, complicating the puerperium: Secondary | ICD-10-CM | POA: Diagnosis not present

## 2023-02-03 DIAGNOSIS — E860 Dehydration: Secondary | ICD-10-CM | POA: Diagnosis not present

## 2023-02-03 DIAGNOSIS — K432 Incisional hernia without obstruction or gangrene: Secondary | ICD-10-CM | POA: Diagnosis not present

## 2023-02-03 DIAGNOSIS — O9902 Anemia complicating childbirth: Secondary | ICD-10-CM | POA: Diagnosis not present

## 2023-02-03 DIAGNOSIS — O1092 Unspecified pre-existing hypertension complicating childbirth: Secondary | ICD-10-CM | POA: Diagnosis not present

## 2023-02-03 DIAGNOSIS — Z794 Long term (current) use of insulin: Secondary | ICD-10-CM | POA: Diagnosis not present

## 2023-02-03 DIAGNOSIS — M7989 Other specified soft tissue disorders: Secondary | ICD-10-CM | POA: Diagnosis not present

## 2023-02-03 DIAGNOSIS — O1002 Pre-existing essential hypertension complicating childbirth: Secondary | ICD-10-CM | POA: Diagnosis not present

## 2023-02-03 DIAGNOSIS — O2412 Pre-existing diabetes mellitus, type 2, in childbirth: Secondary | ICD-10-CM | POA: Diagnosis not present

## 2023-02-03 DIAGNOSIS — K5669 Other partial intestinal obstruction: Secondary | ICD-10-CM | POA: Diagnosis not present

## 2023-02-03 DIAGNOSIS — O1093 Unspecified pre-existing hypertension complicating the puerperium: Secondary | ICD-10-CM | POA: Diagnosis not present

## 2023-02-03 DIAGNOSIS — Z7982 Long term (current) use of aspirin: Secondary | ICD-10-CM | POA: Diagnosis not present

## 2023-02-03 DIAGNOSIS — K420 Umbilical hernia with obstruction, without gangrene: Secondary | ICD-10-CM | POA: Diagnosis not present

## 2023-02-03 DIAGNOSIS — K430 Incisional hernia with obstruction, without gangrene: Secondary | ICD-10-CM | POA: Diagnosis not present

## 2023-02-03 LAB — CERVICOVAGINAL ANCILLARY ONLY
Chlamydia: NEGATIVE
Comment: NEGATIVE
Comment: NORMAL
Neisseria Gonorrhea: NEGATIVE

## 2023-02-03 LAB — CBC
HCT: 32.1 % — ABNORMAL LOW (ref 36.0–46.0)
Hemoglobin: 10.3 g/dL — ABNORMAL LOW (ref 12.0–15.0)
MCH: 25.8 pg — ABNORMAL LOW (ref 26.0–34.0)
MCHC: 32.1 g/dL (ref 30.0–36.0)
MCV: 80.3 fL (ref 80.0–100.0)
Platelets: 262 10*3/uL (ref 150–400)
RBC: 4 MIL/uL (ref 3.87–5.11)
RDW: 14.1 % (ref 11.5–15.5)
WBC: 9 10*3/uL (ref 4.0–10.5)
nRBC: 0 % (ref 0.0–0.2)

## 2023-02-03 LAB — RPR: RPR Ser Ql: NONREACTIVE

## 2023-02-03 NOTE — BH Specialist Note (Signed)
Integrated Behavioral Health via Telemedicine Visit  02/11/2023 Marilyn Cooper [id number]  Number of Integrated Behavioral Health Clinician visits: 3- Third Visit  Session Start time: 1730   Session End time: 1806  Total time in minutes: 36   Referring Provider: Dr. Debroah Loop  Patient/Family location: At [facility] Provider location: [facility]  All persons participating in visit: Patient and [facility] Types of Service: Individual psychotherapy and Video visit  I connected with Marilyn Cooper and/or Marilyn Cooper's patient via  Telephone or Engineer, civil (consulting)  (Video is Caregility application) and verified that I am speaking with the correct person using two identifiers. Discussed confidentiality: Yes   I discussed the limitations of telemedicine and the availability of in person appointments.  Discussed there is a possibility of technology failure and discussed alternative modes of communication if that failure occurs.  I discussed that engaging in this telemedicine visit, they consent to the provision of behavioral healthcare and the services will be billed under their insurance.  Patient and/or legal guardian expressed understanding and consented to Telemedicine visit: Yes   Presenting Concerns: Patient and/or family reports the following symptoms/concerns: Ongoing improvements with anxiety and depression. Looking forward to the birth of her new baby Duration of problem: Months; Severity of problem: moderate  Patient and/or Family's Strengths/Protective Factors: Social and Emotional competence, Concrete supports in place (healthy food, safe environments, etc.), Physical Health (exercise, healthy diet, medication compliance, etc.), Caregiver has knowledge of parenting & child development, and Parental Resilience  Goals Addressed: Patient will:  Reduce symptoms of: anxiety and depression   Increase knowledge and/or ability of: coping skills and healthy  habits   Demonstrate ability to: Increase healthy adjustment to current life circumstances  Progress towards Goals: Ongoing  Interventions: Interventions utilized:  Solution-Focused Strategies, Mindfulness or Management consultant, Supportive Counseling, Psychoeducation and/or Health Education, Communication Skills, and Supportive Reflection Standardized Assessments completed: Not Needed  Patient and/or Family Response: The patient was present for today's virtual session. She reports feeling excited and ready for her baby's arrival. She noted a decrease in symptoms of depression and anxiety since the last session. The patient continues to adhere to her medication management regimen and reports positive progress in this area. In the session, the patient discussed ongoing efforts to set healthy and appropriate boundaries with her mother and friends, acknowledging both challenges and progress. She reflected on her upcoming C-section scheduled for 02/06/23, processing her feelings and discussing her preparations for the event. The patient also shared that her family has been actively preparing for the baby's arrival, including cleaning the house and managing laundry. She reported working with her parents on a schedule to ensure they will be available to supervise and care for her older children while she is in the hospital. Overall, the patient appeared to be coping well with her upcoming transition, demonstrating positive emotional readiness and effective planning for both the birth and family support.   Assessment: Patient currently experiencing excitement and readiness for her baby's arrival, with a decrease in symptoms of depression and anxiety. She is actively preparing for her upcoming C-section, working on setting healthy boundaries with her family, and coordinating support for her older children during her hospital stay.   Patient may benefit from continued support of integrated behavioral health to  support healthy adjustment and decrease symptoms. .  Plan: Follow up with behavioral health clinician on : 02/17/23 Behavioral recommendations: Liandra is encouraged to continue prioritizing self-care and maintaining adherence to her medication management to  sustain her improved emotional well-being. It is also recommended that she continue working on setting and reinforcing healthy boundaries with her family to support her mental and emotional health during this transition.  Referral(s): Integrated [employer] (In Clinic)  I discussed the assessment and treatment plan with the patient and/or parent/guardian. They were provided an opportunity to ask questions and all were answered. They agreed with the plan and demonstrated an understanding of the instructions.   They were advised to call back or seek an in-person evaluation if the symptoms worsen or if the condition fails to improve as anticipated.  Makale Pindell Cruzita Lederer, LCSWA

## 2023-02-06 ENCOUNTER — Inpatient Hospital Stay (HOSPITAL_COMMUNITY)
Admission: RE | Admit: 2023-02-06 | Discharge: 2023-02-10 | DRG: 786 | Disposition: A | Discharge status: Home / Self Care | Payer: BC Managed Care – PPO | Attending: Obstetrics and Gynecology | Admitting: Obstetrics and Gynecology

## 2023-02-06 ENCOUNTER — Inpatient Hospital Stay (HOSPITAL_COMMUNITY): Payer: BC Managed Care – PPO | Admitting: Anesthesiology

## 2023-02-06 ENCOUNTER — Other Ambulatory Visit: Payer: Self-pay

## 2023-02-06 ENCOUNTER — Encounter (HOSPITAL_COMMUNITY): Payer: Self-pay | Admitting: Family Medicine

## 2023-02-06 ENCOUNTER — Encounter (HOSPITAL_COMMUNITY)
Admission: RE | Disposition: A | Discharge status: Home / Self Care | Payer: Self-pay | Source: Home / Self Care | Attending: Family Medicine

## 2023-02-06 DIAGNOSIS — O115 Pre-existing hypertension with pre-eclampsia, complicating the puerperium: Secondary | ICD-10-CM | POA: Diagnosis not present

## 2023-02-06 DIAGNOSIS — O1092 Unspecified pre-existing hypertension complicating childbirth: Secondary | ICD-10-CM | POA: Diagnosis not present

## 2023-02-06 DIAGNOSIS — Z794 Long term (current) use of insulin: Secondary | ICD-10-CM | POA: Diagnosis not present

## 2023-02-06 DIAGNOSIS — Z9049 Acquired absence of other specified parts of digestive tract: Secondary | ICD-10-CM

## 2023-02-06 DIAGNOSIS — Z888 Allergy status to other drugs, medicaments and biological substances status: Secondary | ICD-10-CM

## 2023-02-06 DIAGNOSIS — E119 Type 2 diabetes mellitus without complications: Secondary | ICD-10-CM | POA: Diagnosis present

## 2023-02-06 DIAGNOSIS — Z3A37 37 weeks gestation of pregnancy: Secondary | ICD-10-CM

## 2023-02-06 DIAGNOSIS — O0990 Supervision of high risk pregnancy, unspecified, unspecified trimester: Secondary | ICD-10-CM

## 2023-02-06 DIAGNOSIS — O1002 Pre-existing essential hypertension complicating childbirth: Secondary | ICD-10-CM | POA: Diagnosis not present

## 2023-02-06 DIAGNOSIS — O2412 Pre-existing diabetes mellitus, type 2, in childbirth: Secondary | ICD-10-CM | POA: Diagnosis present

## 2023-02-06 DIAGNOSIS — M7989 Other specified soft tissue disorders: Secondary | ICD-10-CM | POA: Diagnosis not present

## 2023-02-06 DIAGNOSIS — O24424 Gestational diabetes mellitus in childbirth, insulin controlled: Secondary | ICD-10-CM | POA: Diagnosis not present

## 2023-02-06 DIAGNOSIS — Z8249 Family history of ischemic heart disease and other diseases of the circulatory system: Secondary | ICD-10-CM

## 2023-02-06 DIAGNOSIS — Z833 Family history of diabetes mellitus: Secondary | ICD-10-CM | POA: Diagnosis not present

## 2023-02-06 DIAGNOSIS — O360930 Maternal care for other rhesus isoimmunization, third trimester, not applicable or unspecified: Secondary | ICD-10-CM | POA: Diagnosis present

## 2023-02-06 DIAGNOSIS — O34211 Maternal care for low transverse scar from previous cesarean delivery: Principal | ICD-10-CM | POA: Diagnosis present

## 2023-02-06 DIAGNOSIS — K430 Incisional hernia with obstruction, without gangrene: Secondary | ICD-10-CM | POA: Diagnosis present

## 2023-02-06 DIAGNOSIS — O9962 Diseases of the digestive system complicating childbirth: Secondary | ICD-10-CM | POA: Diagnosis present

## 2023-02-06 DIAGNOSIS — K420 Umbilical hernia with obstruction, without gangrene: Secondary | ICD-10-CM | POA: Diagnosis present

## 2023-02-06 DIAGNOSIS — O1093 Unspecified pre-existing hypertension complicating the puerperium: Secondary | ICD-10-CM | POA: Diagnosis present

## 2023-02-06 DIAGNOSIS — O99344 Other mental disorders complicating childbirth: Secondary | ICD-10-CM | POA: Diagnosis not present

## 2023-02-06 DIAGNOSIS — K56609 Unspecified intestinal obstruction, unspecified as to partial versus complete obstruction: Secondary | ICD-10-CM

## 2023-02-06 DIAGNOSIS — O99284 Endocrine, nutritional and metabolic diseases complicating childbirth: Secondary | ICD-10-CM | POA: Diagnosis present

## 2023-02-06 DIAGNOSIS — O10919 Unspecified pre-existing hypertension complicating pregnancy, unspecified trimester: Secondary | ICD-10-CM | POA: Diagnosis present

## 2023-02-06 DIAGNOSIS — Z98891 History of uterine scar from previous surgery: Secondary | ICD-10-CM

## 2023-02-06 DIAGNOSIS — Z01818 Encounter for other preprocedural examination: Principal | ICD-10-CM

## 2023-02-06 DIAGNOSIS — K56600 Partial intestinal obstruction, unspecified as to cause: Secondary | ICD-10-CM | POA: Diagnosis present

## 2023-02-06 DIAGNOSIS — Z7982 Long term (current) use of aspirin: Secondary | ICD-10-CM | POA: Diagnosis not present

## 2023-02-06 DIAGNOSIS — Z3490 Encounter for supervision of normal pregnancy, unspecified, unspecified trimester: Secondary | ICD-10-CM

## 2023-02-06 DIAGNOSIS — E860 Dehydration: Secondary | ICD-10-CM | POA: Diagnosis not present

## 2023-02-06 DIAGNOSIS — O9902 Anemia complicating childbirth: Secondary | ICD-10-CM | POA: Diagnosis present

## 2023-02-06 DIAGNOSIS — O99214 Obesity complicating childbirth: Secondary | ICD-10-CM | POA: Diagnosis not present

## 2023-02-06 LAB — CULTURE, BETA STREP (GROUP B ONLY): Strep Gp B Culture: NEGATIVE

## 2023-02-06 LAB — COMPREHENSIVE METABOLIC PANEL
ALT: 10 U/L (ref 0–44)
AST: 13 U/L — ABNORMAL LOW (ref 15–41)
Albumin: 2.7 g/dL — ABNORMAL LOW (ref 3.5–5.0)
Alkaline Phosphatase: 88 U/L (ref 38–126)
Anion gap: 11 (ref 5–15)
BUN: 5 mg/dL — ABNORMAL LOW (ref 6–20)
CO2: 17 mmol/L — ABNORMAL LOW (ref 22–32)
Calcium: 8.5 mg/dL — ABNORMAL LOW (ref 8.9–10.3)
Chloride: 107 mmol/L (ref 98–111)
Creatinine, Ser: 0.52 mg/dL (ref 0.44–1.00)
GFR, Estimated: >60 mL/min (ref >60)
Glucose, Bld: 112 mg/dL — ABNORMAL HIGH (ref 70–99)
Potassium: 3.7 mmol/L (ref 3.5–5.1)
Sodium: 135 mmol/L (ref 135–145)
Total Bilirubin: <0.2 mg/dL (ref 0.0–1.2)
Total Protein: 7 g/dL (ref 6.5–8.1)

## 2023-02-06 LAB — PREPARE RBC (CROSSMATCH)

## 2023-02-06 LAB — GLUCOSE, CAPILLARY
Glucose-Capillary: 100 mg/dL — ABNORMAL HIGH (ref 70–99)
Glucose-Capillary: 111 mg/dL — ABNORMAL HIGH (ref 70–99)
Glucose-Capillary: 126 mg/dL — ABNORMAL HIGH (ref 70–99)
Glucose-Capillary: 117 mg/dL — ABNORMAL HIGH (ref 70–99)

## 2023-02-06 SURGERY — Surgical Case
Anesthesia: Spinal | Site: Abdomen

## 2023-02-06 MED ORDER — METOCLOPRAMIDE HCL 5 MG/ML IJ SOLN
INTRAMUSCULAR | Status: DC | PRN
  Administered 2023-02-06: 10 mg via INTRAVENOUS

## 2023-02-06 MED ORDER — MORPHINE SULFATE (PF) 0.5 MG/ML IJ SOLN
INTRAMUSCULAR | Status: AC
  Filled 2023-02-06: qty 10

## 2023-02-06 MED ORDER — POVIDONE-IODINE 10 % EX SWAB: 2.0000 | Freq: Once | CUTANEOUS | Status: DC

## 2023-02-06 MED ORDER — NALOXONE HCL 0.4 MG/ML IJ SOLN: 0.4000 mg | INTRAMUSCULAR | Status: DC | PRN

## 2023-02-06 MED ORDER — CEFAZOLIN SODIUM-DEXTROSE 2-4 GM/100ML-% IV SOLN
INTRAVENOUS | Status: AC
  Filled 2023-02-06: qty 100

## 2023-02-06 MED ORDER — HYDRALAZINE HCL 20 MG/ML IJ SOLN
10.0000 mg | INTRAMUSCULAR | Status: DC | PRN
Start: 2023-02-06 — End: 2023-02-06

## 2023-02-06 MED ORDER — LABETALOL HCL 200 MG PO TABS
400.0000 mg | ORAL_TABLET | Freq: Two times a day (BID) | ORAL | Status: DC
  Administered 2023-02-06 – 2023-02-07 (×2): 400 mg via ORAL
  Filled 2023-02-06 (×2): qty 2

## 2023-02-06 MED ORDER — OXYCODONE HCL 5 MG/5ML PO SOLN: 5.0000 mg | Freq: Once | ORAL | Status: DC | PRN

## 2023-02-06 MED ORDER — HYDROMORPHONE HCL 1 MG/ML IJ SOLN: 0.2500 mg | INTRAMUSCULAR | Status: DC | PRN

## 2023-02-06 MED ORDER — KETOROLAC TROMETHAMINE 30 MG/ML IJ SOLN: 30.0000 mg | Freq: Four times a day (QID) | INTRAMUSCULAR | Status: DC | PRN

## 2023-02-06 MED ORDER — MEPERIDINE HCL 25 MG/ML IJ SOLN: 6.2500 mg | INTRAMUSCULAR | Status: DC | PRN

## 2023-02-06 MED ORDER — HYDRALAZINE HCL 20 MG/ML IJ SOLN
5.0000 mg | INTRAMUSCULAR | Status: DC | PRN
Start: 2023-02-06 — End: 2023-02-06
  Administered 2023-02-06: 5 mg via INTRAVENOUS

## 2023-02-06 MED ORDER — OXYTOCIN-SODIUM CHLORIDE 30-0.9 UT/500ML-% IV SOLN: 2.5000 [IU]/h | INTRAVENOUS | Status: AC

## 2023-02-06 MED ORDER — DIPHENHYDRAMINE HCL 25 MG PO CAPS: 25.0000 mg | ORAL_CAPSULE | ORAL | Status: DC | PRN

## 2023-02-06 MED ORDER — KETOROLAC TROMETHAMINE 30 MG/ML IJ SOLN
INTRAMUSCULAR | Status: AC
  Filled 2023-02-06: qty 1

## 2023-02-06 MED ORDER — CEFAZOLIN SODIUM-DEXTROSE 2-4 GM/100ML-% IV SOLN
2.0000 g | INTRAVENOUS | Status: AC
  Administered 2023-02-06: 3 g via INTRAVENOUS

## 2023-02-06 MED ORDER — FENTANYL CITRATE (PF) 100 MCG/2ML IJ SOLN
INTRAMUSCULAR | Status: AC
  Filled 2023-02-06: qty 2

## 2023-02-06 MED ORDER — PRENATAL MULTIVITAMIN CH
1.0000 | ORAL_TABLET | Freq: Every day | ORAL | Status: DC
  Administered 2023-02-07 – 2023-02-08 (×2): 1 via ORAL
  Filled 2023-02-06 (×2): qty 1

## 2023-02-06 MED ORDER — HYDRALAZINE HCL 20 MG/ML IJ SOLN
INTRAMUSCULAR | Status: AC
Start: 2023-02-06 — End: ?
  Filled 2023-02-06: qty 1

## 2023-02-06 MED ORDER — PHENYLEPHRINE HCL-NACL 20-0.9 MG/250ML-% IV SOLN
INTRAVENOUS | Status: AC
Start: 2023-02-06 — End: ?
  Filled 2023-02-06: qty 250

## 2023-02-06 MED ORDER — WITCH HAZEL-GLYCERIN EX PADS: 1.0000 | MEDICATED_PAD | CUTANEOUS | Status: DC | PRN

## 2023-02-06 MED ORDER — ALBUMIN HUMAN 5 % IV SOLN
INTRAVENOUS | Status: AC
  Filled 2023-02-06: qty 250

## 2023-02-06 MED ORDER — SCOPOLAMINE 1 MG/3DAYS TD PT72
MEDICATED_PATCH | TRANSDERMAL | Status: DC | PRN
  Administered 2023-02-06: 1 via TRANSDERMAL

## 2023-02-06 MED ORDER — STERILE WATER FOR IRRIGATION IR SOLN
Status: DC | PRN
  Administered 2023-02-06: 1000 mL

## 2023-02-06 MED ORDER — ESCITALOPRAM OXALATE 10 MG PO TABS
10.0000 mg | ORAL_TABLET | Freq: Every day | ORAL | Status: DC
Start: 2023-02-07 — End: 2023-02-10
  Administered 2023-02-07 – 2023-02-10 (×4): 10 mg via ORAL
  Filled 2023-02-06 (×4): qty 1

## 2023-02-06 MED ORDER — FENTANYL CITRATE (PF) 100 MCG/2ML IJ SOLN
INTRAMUSCULAR | Status: DC | PRN
  Administered 2023-02-06: 15 ug via INTRATHECAL

## 2023-02-06 MED ORDER — NIFEDIPINE ER OSMOTIC RELEASE 30 MG PO TB24: 60.0000 mg | ORAL_TABLET | Freq: Every day | ORAL | Status: DC

## 2023-02-06 MED ORDER — SODIUM CHLORIDE 0.9 % IR SOLN
Status: DC | PRN
  Administered 2023-02-06: 1000 mL

## 2023-02-06 MED ORDER — ACETAMINOPHEN 10 MG/ML IV SOLN
INTRAVENOUS | Status: DC | PRN
  Administered 2023-02-06: 1000 mg via INTRAVENOUS

## 2023-02-06 MED ORDER — PHENYLEPHRINE 80 MCG/ML (10ML) SYRINGE FOR IV PUSH (FOR BLOOD PRESSURE SUPPORT)
PREFILLED_SYRINGE | INTRAVENOUS | Status: AC
  Filled 2023-02-06: qty 10

## 2023-02-06 MED ORDER — DIPHENHYDRAMINE HCL 50 MG/ML IJ SOLN: 12.5000 mg | INTRAMUSCULAR | Status: DC | PRN

## 2023-02-06 MED ORDER — CEFAZOLIN SODIUM 3 G IV SOLR
INTRAVENOUS | Status: AC
  Filled 2023-02-06: qty 3

## 2023-02-06 MED ORDER — PHENYLEPHRINE HCL (PRESSORS) 10 MG/ML IV SOLN
INTRAVENOUS | Status: DC | PRN
  Administered 2023-02-06: 120 ug via INTRAVENOUS
  Administered 2023-02-06 (×2): 160 ug via INTRAVENOUS
  Administered 2023-02-06: 80 ug via INTRAVENOUS

## 2023-02-06 MED ORDER — INSULIN ASPART 100 UNIT/ML IJ SOLN
0.0000 [IU] | Freq: Three times a day (TID) | INTRAMUSCULAR | Status: DC
  Administered 2023-02-06: 2 [IU] via SUBCUTANEOUS

## 2023-02-06 MED ORDER — SCOPOLAMINE 1 MG/3DAYS TD PT72
MEDICATED_PATCH | TRANSDERMAL | Status: AC
  Filled 2023-02-06: qty 1

## 2023-02-06 MED ORDER — ALBUMIN HUMAN 5 % IV SOLN: INTRAVENOUS | Status: DC | PRN

## 2023-02-06 MED ORDER — ONDANSETRON HCL 4 MG/2ML IJ SOLN: 4.0000 mg | Freq: Once | INTRAMUSCULAR | Status: DC | PRN

## 2023-02-06 MED ORDER — MENTHOL 3 MG MT LOZG: 1.0000 | LOZENGE | OROMUCOSAL | Status: DC | PRN

## 2023-02-06 MED ORDER — ONDANSETRON HCL 4 MG/2ML IJ SOLN
INTRAMUSCULAR | Status: AC
  Filled 2023-02-06: qty 2

## 2023-02-06 MED ORDER — IBUPROFEN 600 MG PO TABS
600.0000 mg | ORAL_TABLET | Freq: Four times a day (QID) | ORAL | Status: DC
  Administered 2023-02-07 – 2023-02-08 (×4): 600 mg via ORAL
  Filled 2023-02-06 (×4): qty 1

## 2023-02-06 MED ORDER — OXYTOCIN-SODIUM CHLORIDE 30-0.9 UT/500ML-% IV SOLN
INTRAVENOUS | Status: AC
  Filled 2023-02-06: qty 500

## 2023-02-06 MED ORDER — LABETALOL HCL 5 MG/ML IV SOLN
40.0000 mg | INTRAVENOUS | Status: DC | PRN
Start: 2023-02-06 — End: 2023-02-06

## 2023-02-06 MED ORDER — NALOXONE HCL 4 MG/10ML IJ SOLN: 1.0000 ug/kg/h | INTRAVENOUS | Status: DC | PRN

## 2023-02-06 MED ORDER — ENOXAPARIN SODIUM 80 MG/0.8ML IJ SOSY
70.0000 mg | PREFILLED_SYRINGE | INTRAMUSCULAR | Status: DC
  Administered 2023-02-07 – 2023-02-10 (×3): 70 mg via SUBCUTANEOUS
  Filled 2023-02-06 (×3): qty 0.8

## 2023-02-06 MED ORDER — BUPIVACAINE IN DEXTROSE 0.75-8.25 % IT SOLN
INTRATHECAL | Status: DC | PRN
  Administered 2023-02-06: 1.5 mL via INTRATHECAL

## 2023-02-06 MED ORDER — INSULIN ASPART 100 UNIT/ML IJ SOLN: 0.0000 [IU] | Freq: Every day | INTRAMUSCULAR | Status: DC

## 2023-02-06 MED ORDER — DIBUCAINE (PERIANAL) 1 % EX OINT: 1.0000 | TOPICAL_OINTMENT | CUTANEOUS | Status: DC | PRN

## 2023-02-06 MED ORDER — SIMETHICONE 80 MG PO CHEW
80.0000 mg | CHEWABLE_TABLET | Freq: Three times a day (TID) | ORAL | Status: DC
  Administered 2023-02-07 – 2023-02-10 (×9): 80 mg via ORAL
  Filled 2023-02-06 (×8): qty 1

## 2023-02-06 MED ORDER — SIMETHICONE 80 MG PO CHEW: 80.0000 mg | CHEWABLE_TABLET | ORAL | Status: DC | PRN

## 2023-02-06 MED ORDER — KETOROLAC TROMETHAMINE 30 MG/ML IJ SOLN
30.0000 mg | Freq: Four times a day (QID) | INTRAMUSCULAR | Status: AC
  Administered 2023-02-06 – 2023-02-07 (×3): 30 mg via INTRAVENOUS
  Filled 2023-02-06 (×3): qty 1

## 2023-02-06 MED ORDER — ONDANSETRON HCL 4 MG/2ML IJ SOLN
INTRAMUSCULAR | Status: DC | PRN
  Administered 2023-02-06: 4 mg via INTRAVENOUS

## 2023-02-06 MED ORDER — COCONUT OIL OIL: 1.0000 | TOPICAL_OIL | Status: DC | PRN

## 2023-02-06 MED ORDER — DIPHENHYDRAMINE HCL 25 MG PO CAPS
25.0000 mg | ORAL_CAPSULE | Freq: Four times a day (QID) | ORAL | Status: DC | PRN
Start: 2023-02-06 — End: 2023-02-10

## 2023-02-06 MED ORDER — METOCLOPRAMIDE HCL 5 MG/ML IJ SOLN
INTRAMUSCULAR | Status: AC
  Filled 2023-02-06: qty 2

## 2023-02-06 MED ORDER — SENNOSIDES-DOCUSATE SODIUM 8.6-50 MG PO TABS
2.0000 | ORAL_TABLET | Freq: Every day | ORAL | Status: DC
  Administered 2023-02-07 – 2023-02-10 (×4): 2 via ORAL
  Filled 2023-02-06 (×4): qty 2

## 2023-02-06 MED ORDER — LACTATED RINGERS IV SOLN: INTRAVENOUS | Status: DC

## 2023-02-06 MED ORDER — ONDANSETRON HCL 4 MG/2ML IJ SOLN
4.0000 mg | Freq: Three times a day (TID) | INTRAMUSCULAR | Status: DC | PRN
  Administered 2023-02-08: 4 mg via INTRAVENOUS
  Filled 2023-02-06: qty 2

## 2023-02-06 MED ORDER — LABETALOL HCL 5 MG/ML IV SOLN
20.0000 mg | INTRAVENOUS | Status: DC | PRN
Start: 2023-02-06 — End: 2023-02-06

## 2023-02-06 MED ORDER — AMISULPRIDE (ANTIEMETIC) 5 MG/2ML IV SOLN: 10.0000 mg | Freq: Once | INTRAVENOUS | Status: DC | PRN

## 2023-02-06 MED ORDER — MORPHINE SULFATE (PF) 0.5 MG/ML IJ SOLN
INTRAMUSCULAR | Status: DC | PRN
  Administered 2023-02-06: 150 ug via INTRATHECAL

## 2023-02-06 MED ORDER — ACETAMINOPHEN 500 MG PO TABS: 1000.0000 mg | ORAL_TABLET | Freq: Four times a day (QID) | ORAL | Status: DC

## 2023-02-06 MED ORDER — FERROUS SULFATE 325 (65 FE) MG PO TABS
325.0000 mg | ORAL_TABLET | Freq: Every day | ORAL | Status: DC
  Administered 2023-02-07: 325 mg via ORAL
  Filled 2023-02-06: qty 1

## 2023-02-06 MED ORDER — TRANEXAMIC ACID-NACL 1000-0.7 MG/100ML-% IV SOLN
1000.0000 mg | Freq: Once | INTRAVENOUS | Status: AC
  Administered 2023-02-06: 1000 mg via INTRAVENOUS

## 2023-02-06 MED ORDER — ACETAMINOPHEN 500 MG PO TABS
1000.0000 mg | ORAL_TABLET | Freq: Four times a day (QID) | ORAL | Status: DC
  Administered 2023-02-06 – 2023-02-10 (×11): 1000 mg via ORAL
  Filled 2023-02-06 (×12): qty 2

## 2023-02-06 MED ORDER — ZOLPIDEM TARTRATE 5 MG PO TABS
5.0000 mg | ORAL_TABLET | Freq: Every evening | ORAL | Status: DC | PRN
Start: 2023-02-06 — End: 2023-02-09

## 2023-02-06 MED ORDER — DEXAMETHASONE SODIUM PHOSPHATE 4 MG/ML IJ SOLN
INTRAMUSCULAR | Status: AC
Start: 2023-02-06 — End: ?
  Filled 2023-02-06: qty 2

## 2023-02-06 MED ORDER — OXYCODONE HCL 5 MG PO TABS
5.0000 mg | ORAL_TABLET | ORAL | Status: DC | PRN
  Administered 2023-02-08: 10 mg via ORAL
  Filled 2023-02-06: qty 2

## 2023-02-06 MED ORDER — OXYTOCIN-SODIUM CHLORIDE 30-0.9 UT/500ML-% IV SOLN
INTRAVENOUS | Status: DC | PRN
  Administered 2023-02-06: 300 mL via INTRAVENOUS

## 2023-02-06 MED ORDER — SODIUM CHLORIDE 0.9% FLUSH
3.0000 mL | INTRAVENOUS | Status: DC | PRN
  Administered 2023-02-08: 3 mL via INTRAVENOUS

## 2023-02-06 MED ORDER — KETOROLAC TROMETHAMINE 30 MG/ML IJ SOLN
30.0000 mg | Freq: Once | INTRAMUSCULAR | Status: AC | PRN
  Administered 2023-02-06: 30 mg via INTRAVENOUS

## 2023-02-06 MED ORDER — FUROSEMIDE 20 MG PO TABS
20.0000 mg | ORAL_TABLET | Freq: Every day | ORAL | Status: DC
  Administered 2023-02-07 – 2023-02-08 (×2): 20 mg via ORAL
  Filled 2023-02-06 (×2): qty 1

## 2023-02-06 MED ORDER — METFORMIN HCL 500 MG PO TABS
1000.0000 mg | ORAL_TABLET | Freq: Every day | ORAL | Status: DC
  Administered 2023-02-07 – 2023-02-10 (×3): 1000 mg via ORAL
  Filled 2023-02-06 (×3): qty 2

## 2023-02-06 MED ORDER — OXYCODONE HCL 5 MG PO TABS: 5.0000 mg | ORAL_TABLET | Freq: Once | ORAL | Status: DC | PRN

## 2023-02-06 MED ORDER — PHENYLEPHRINE HCL-NACL 20-0.9 MG/250ML-% IV SOLN
INTRAVENOUS | Status: DC | PRN
  Administered 2023-02-06: 30 ug/min via INTRAVENOUS

## 2023-02-06 MED ORDER — LORATADINE 10 MG PO TABS
10.0000 mg | ORAL_TABLET | Freq: Every day | ORAL | Status: DC
  Administered 2023-02-07 – 2023-02-10 (×4): 10 mg via ORAL
  Filled 2023-02-06 (×4): qty 1

## 2023-02-06 MED ORDER — SCOPOLAMINE 1 MG/3DAYS TD PT72: 1.0000 | MEDICATED_PATCH | Freq: Once | TRANSDERMAL | Status: DC

## 2023-02-06 SURGICAL SUPPLY — 39 items
BENZOIN TINCTURE PRP APPL 2/3 (GAUZE/BANDAGES/DRESSINGS) IMPLANT
CHLORAPREP W/TINT 26 (MISCELLANEOUS) ×6 IMPLANT
CLAMP UMBILICAL CORD (MISCELLANEOUS) ×3 IMPLANT
CLOTH BEACON ORANGE TIMEOUT ST (SAFETY) ×3 IMPLANT
DERMABOND ADVANCED .7 DNX12 (GAUZE/BANDAGES/DRESSINGS) ×6 IMPLANT
DRESSING PREVENA PLUS CUSTOM (GAUZE/BANDAGES/DRESSINGS) IMPLANT
DRSG OPSITE POSTOP 4X10 (GAUZE/BANDAGES/DRESSINGS) ×3 IMPLANT
DRSG PREVENA PLUS CUSTOM (GAUZE/BANDAGES/DRESSINGS) ×2
ELECT REM PT RETURN 9FT ADLT (ELECTROSURGICAL) ×2
ELECTRODE REM PT RTRN 9FT ADLT (ELECTROSURGICAL) ×3 IMPLANT
EXCISOR BIOPSY CONE FISHER (MISCELLANEOUS) IMPLANT
EXTENDER TRAXI PANNICULUS (MISCELLANEOUS) IMPLANT
EXTRACTOR VACUUM M CUP 4 TUBE (SUCTIONS) IMPLANT
GAUZE SPONGE 4X4 12PLY STRL LF (GAUZE/BANDAGES/DRESSINGS) IMPLANT
GLOVE BIOGEL PI IND STRL 7.0 (GLOVE) ×6 IMPLANT
GLOVE BIOGEL PI IND STRL 7.5 (GLOVE) ×6 IMPLANT
GLOVE ECLIPSE 7.5 STRL STRAW (GLOVE) ×3 IMPLANT
GOWN STRL REUS W/TWL LRG LVL3 (GOWN DISPOSABLE) ×9 IMPLANT
KIT ABG SYR 3ML LUER SLIP (SYRINGE) IMPLANT
NDL HYPO 25X5/8 SAFETYGLIDE (NEEDLE) IMPLANT
NDL KEITH (NEEDLE) IMPLANT
NEEDLE HYPO 25X5/8 SAFETYGLIDE (NEEDLE) IMPLANT
NEEDLE KEITH (NEEDLE) ×2 IMPLANT
NS IRRIG 1000ML POUR BTL (IV SOLUTION) ×3 IMPLANT
PACK C SECTION WH (CUSTOM PROCEDURE TRAY) ×3 IMPLANT
PAD OB MATERNITY 4.3X12.25 (PERSONAL CARE ITEMS) ×3 IMPLANT
RETRACTOR TRAXI PANNICULUS (MISCELLANEOUS) IMPLANT
RETRACTOR WND ALEXIS 25 LRG (MISCELLANEOUS) IMPLANT
RTRCTR C-SECT PINK 25CM LRG (MISCELLANEOUS) ×3 IMPLANT
RTRCTR WOUND ALEXIS 25CM LRG (MISCELLANEOUS) ×2
SUT MNCRL 0 VIOLET CTX 36 (SUTURE) ×6 IMPLANT
SUT PDS AB 0 CTX 60 (SUTURE) IMPLANT
SUT VIC AB 0 CTX36XBRD ANBCTRL (SUTURE) ×3 IMPLANT
SUT VIC AB 2-0 CT1 (SUTURE) IMPLANT
SUT VIC AB 2-0 CT1 TAPERPNT 27 (SUTURE) ×3 IMPLANT
SUT VIC AB 4-0 KS 27 (SUTURE) ×3 IMPLANT
TOWEL OR 17X24 6PK STRL BLUE (TOWEL DISPOSABLE) ×3 IMPLANT
TRAY FOLEY W/BAG SLVR 14FR LF (SET/KITS/TRAYS/PACK) ×3 IMPLANT
WATER STERILE IRR 1000ML POUR (IV SOLUTION) ×3 IMPLANT

## 2023-02-06 NOTE — Anesthesia Procedure Notes (Deleted)
Spinal  Patient location during procedure: OR Staffing Performed by: Graciela Husbands, CRNA Authorized by: [name], DO   Preanesthetic Checklist Completed: patient identified, IV checked, site marked, risks and benefits discussed, surgical consent, monitors and equipment checked, pre-op evaluation and timeout performed Spinal Block Patient position: sitting Prep: DuraPrep Patient monitoring: cardiac monitor, continuous pulse ox, blood pressure and heart rate Approach: midline Location: L3-4 Injection technique: catheter Needle Needle type: Tuohy and Sprotte  Needle gauge: 24 G Needle length: 12.7 cm Needle insertion depth: 7 cm Catheter type: closed end flexible Catheter size: 19 g Catheter at skin depth: 13 cm Additional Notes  CSF return with Sprotte needle. Sprotte needle 24G .  No CSF return with Tuohy needle. Tuohy needle 18G, 89cm

## 2023-02-06 NOTE — Op Note (Signed)
Marilyn Cooper PROCEDURE DATE: 02/06/2023  PREOPERATIVE DIAGNOSIS: Intrauterine pregnancy at  [redacted]w[redacted]d weeks gestation;  4 prior cesarean section, chronic hypertension, type 2 diabetes, BMI 52  POSTOPERATIVE DIAGNOSIS: The same  PROCEDURE: Repeat Low Transverse Cesarean Section  SURGEON:  Dr. Candelaria Celeste  ASSISTANT: Dr Rayann Heman  An experienced assistant was required given the standard of surgical care given the complexity of the case.  This assistant was needed for exposure, dissection, suctioning, retraction, instrument exchange, assisting with delivery with administration of fundal pressure, and for overall help during the procedure.   INDICATIONS: Marilyn Cooper is a 33 y.o. [id number] at [redacted]w[redacted]d scheduled for cesarean section secondary to  4 prior cesarean section, chronic hypertension, type 2 diabetes, BMI 52 .  The risks of cesarean section discussed with the patient included but were not limited to: bleeding which may require transfusion or reoperation; infection which may require antibiotics; injury to bowel, bladder, ureters or other surrounding organs; injury to the fetus; need for additional procedures including hysterectomy in the event of a life-threatening hemorrhage; placental abnormalities wth subsequent pregnancies, incisional problems, thromboembolic phenomenon and other postoperative/anesthesia complications. The patient concurred with the proposed plan, giving informed written consent for the procedure.    FINDINGS:  Viable girl infant in vertex presentation.  Apgars 8 and 8, weight, 7 pounds and 8.6 ounces.  Clear amniotic fluid.  Intact placenta, three vessel cord.  2cm uterine window. Normal uterus, fallopian tubes and ovaries bilaterally.  ANESTHESIA:    Spinal INTRAVENOUS FLUIDS: 2000 ml crystaloid, colloid ESTIMATED BLOOD LOSS: 412 ml URINE OUTPUT:  40 ml SPECIMENS: Placenta sent to L&D COMPLICATIONS: None immediate  PROCEDURE IN DETAIL:  The patient  received intravenous antibiotics and had sequential compression devices applied to her lower extremities while in the preoperative area.  She was then taken to the operating room where spinal anesthesia was administered and was found to be adequate. She was then placed in a dorsal supine position with a leftward tilt, and prepped and draped in a sterile manner.  A foley catheter was placed into her bladder and attached to constant gravity, which drained clear fluid throughout.  After an adequate timeout was performed, a Pfannenstiel skin incision was made with scalpel and carried through to the underlying layer of fascia. The fascia was incised in the midline and this incision was extended bilaterally using the Mayo scissors. The fascia was noted to be densely adhered to the underlying rectus muscles. Kocher clamps were applied to the superior aspect of the fascial incision and the underlying rectus muscles were dissected off sharply. The rectus muscles were separated in the midline bluntly and the peritoneum was entered bluntly.   An large Alexis retractor was placed to aid in visualization of the uterus.  Attention was turned to the lower uterine segment. On inspection there was a 2cm window noticed on the right side of the previous incision. A transverse hysterotomy was made with a scalpel superior to her old incision and extended bilaterally bluntly. The infant was successfully delivered, and cord was clamped and cut and infant was handed over to awaiting neonatology team. Uterine massage was then administered and the placenta delivered intact with three-vessel cord. The uterus was then cleared of clot and debris.  The hysterotomy was closed with 0 Monocryl in a running fashion. Overall, excellent hemostasis was noted. The abdomen and the pelvis were cleared of all clot and debris and the Jon Gills was removed. Hemostasis was confirmed on all surfaces.  Due  to the large amount of intra-abdominal pressure, a  large amount of intestines and omentum extruded from her abdominal. The patient was tipped up in trendelenburg and the abdominal contents were replaced with difficulty. The peritoneum was reapproximated using 2-0 vicryl running stitches.   The fascia was then closed using 0 Vicryl in a running fashion. The subcutaneous layer was reapproximated with plain gut and the skin was closed with 4-0 vicryl. The patient tolerated the procedure well. Sponge, lap, instrument and needle counts were correct x 2. She was taken to the recovery room in stable condition.    Levie Heritage, DO 02/06/2023 1:37 PM

## 2023-02-06 NOTE — Anesthesia Procedure Notes (Signed)
Epidural Patient location during procedure: OB Start time: 02/06/2023 12:07 PM End time: 02/06/2023 12:11 PM  Staffing Anesthesiologist: [name], DO Performed: anesthesiologist and other anesthesia staff   Preanesthetic Checklist Completed: patient identified, IV checked, risks and benefits discussed, monitors and equipment checked, pre-op evaluation and timeout performed  Epidural Patient position: sitting Prep: DuraPrep and site prepped and draped Patient monitoring: continuous pulse ox, blood pressure, heart rate and cardiac monitor Approach: midline Location: L3-L4 Injection technique: LOR air  Needle:  Needle type: Tuohy  Needle gauge: 17 G Needle length: 9 cm Needle insertion depth: 7 cm Catheter type: closed end flexible Catheter size: 19 Gauge Catheter at skin depth: 13 cm Test dose: negative  Assessment Sensory level: T8 Events: blood not aspirated, no cerebrospinal fluid, injection not painful, no injection resistance, no paresthesia and negative IV test  Additional Notes Patient identified. Risks/Benefits/Options discussed with patient including but not limited to bleeding, infection, nerve damage, paralysis, failed block, incomplete pain control, headache, blood pressure changes, nausea, vomiting, reactions to medication both or allergic, itching and postpartum back pain. Confirmed with bedside nurse the patient's most recent platelet count. Confirmed with patient that they are not currently taking any anticoagulation, have any bleeding history or any family history of bleeding disorders. Patient expressed understanding and wished to proceed. All questions were answered. Sterile technique was used throughout the entire procedure. Please see nursing notes for vital signs. Test dose was given through epidural catheter and negative prior to continuing to dose epidural or start infusion. Warning signs of high block given to the patient including shortness of  breath, tingling/numbness in hands, complete motor block, or any concerning symptoms with instructions to call for help. Patient was given instructions on fall risk and not to get out of bed. All questions and concerns addressed with instructions to call with any issues or inadequate analgesia.     CSE for c section performed by srna under direct supervision. 24G pencan advanced through tuohy, clear CSF, no issues. Catheter threaded without resistance. Reason for block:procedure for pain

## 2023-02-06 NOTE — Anesthesia Postprocedure Evaluation (Signed)
Anesthesia Post Note  Patient: Marilyn Cooper  Procedure(s) Performed: CESAREAN SECTION (Abdomen) APPLICATION OF CELL SAVER     Patient location during evaluation: PACU Anesthesia Type: Spinal Level of consciousness: awake and alert and oriented Pain management: pain level controlled Vital Signs Assessment: post-procedure vital signs reviewed and stable Respiratory status: spontaneous breathing, nonlabored ventilation and respiratory function stable Cardiovascular status: blood pressure returned to baseline and stable Postop Assessment: no headache, no backache, spinal receding and patient able to bend at knees Anesthetic complications: no   No notable events documented.  Last Vitals:  Vitals:   02/06/23 1454 02/06/23 1458  BP: (!) 151/77   Pulse: 75 80  Resp: 17 19  Temp:    SpO2: 97% 99%    Last Pain:  Vitals:   02/06/23 1430  TempSrc: Oral  PainSc: 0-No pain   Pain Goal:    LLE Motor Response: Purposeful movement (02/06/23 1430) LLE Sensation: Tingling (02/06/23 1430) RLE Motor Response: Purposeful movement (02/06/23 1430) RLE Sensation: Tingling (02/06/23 1430)     Epidural/Spinal Function Cutaneous sensation: Tingles (02/06/23 1430), Patient able to flex knees: Yes (02/06/23 1430), Patient able to lift hips off bed: No (02/06/23 1430), Back pain beyond tenderness at insertion site: No (02/06/23 1430), Progressively worsening motor and/or sensory loss: No (02/06/23 1430), Bowel and/or bladder incontinence post epidural: No (02/06/23 1430)  [name]

## 2023-02-06 NOTE — Anesthesia Preprocedure Evaluation (Addendum)
Anesthesia Evaluation  Patient identified by MRN, date of birth, ID band Patient awake    Reviewed: Allergy & Precautions, NPO status , Patient's Chart, lab work & pertinent test results, reviewed documented beta blocker date and time   Airway Mallampati: IV  TM Distance: >3 FB Neck ROM: Full    Dental  (+) Teeth Intact, Dental Advisory Given   Pulmonary neg pulmonary ROS   Pulmonary exam normal breath sounds clear to auscultation       Cardiovascular hypertension (SBPs 160s preop), Pt. on medications and Pt. on home beta blockers Normal cardiovascular exam Rhythm:Regular Rate:Normal     Neuro/Psych  PSYCHIATRIC DISORDERS Anxiety Depression    negative neurological ROS     GI/Hepatic negative GI ROS, Neg liver ROS,,,  Endo/Other  diabetes, Well Controlled, Gestational, Insulin Dependent  Class 3 obesityBMI 53  Renal/GU negative Renal ROS  negative genitourinary   Musculoskeletal negative musculoskeletal ROS (+)    Abdominal  (+) + obese  Peds  Hematology  (+) Blood dyscrasia, anemia Hb 10.3, plt 262   Anesthesia Other Findings   Reproductive/Obstetrics (+) Pregnancy RCS x 5: 4 prior sections + 2 prior exlaps                              Anesthesia Physical Anesthesia Plan  ASA: 3  Anesthesia Plan: Combined Spinal and Epidural   Post-op Pain Management: Regional block, Ofirmev IV (intra-op)* and Toradol IV (intra-op)*   Induction:   PONV Risk Score and Plan: 3 and Ondansetron, Dexamethasone and Treatment may vary due to age or medical condition  Airway Management Planned: Natural Airway and Nasal Cannula  Additional Equipment: None  Intra-op Plan:   Post-operative Plan:   Informed Consent: I have reviewed the patients History and Physical, chart, labs and discussed the procedure including the risks, benefits and alternatives for the proposed anesthesia with the patient or  authorized representative who has indicated his/her understanding and acceptance.       Plan Discussed with: CRNA  Anesthesia Plan Comments: (Cross x 2 units available- multiple antibodies; per blood bank would be difficult to get more than the two units we currently have. As such, will request cell saver be present. CSE for possible inc length of operative time. )        Anesthesia Quick Evaluation

## 2023-02-06 NOTE — H&P (Signed)
Faculty Practice H&P  Marilyn Cooper is a 33 y.o. female [phone] with IUP at 103w1d presenting for repeat cesarean section for 4 prior cesarean deliveries, CHTN, type 2 diabetes. Pregnancy was been complicated by isoimmunization, type 2 diabetes, HTN, depression/anxiety.    Pt states she has been having no contractions, no vaginal bleeding, intact membranes, with normal fetal movement.     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  She plans to breastfeed, plans to bottle feed  Prenatal labs and studies: ABO, Rh: --/--/A POS (01/17 0935) Antibody: POS (01/17 0935) Rubella: 4.80 (08/06 1543) RPR: NON REACTIVE (01/17 0934)  HBsAg: Negative (08/06 1543)  HIV: Non Reactive (08/06 1543)  GBS: Negative/-- (01/16 1056)  2hr Glucola: not done because type 2 diabetic Genetic screening: normal Anatomy US: normal  Past Medical History:  Past Medical History:  Diagnosis Date   Anemia    Anxiety    Asthma    childhood   Binge eating disorder 07/05/2022   Candidiasis of skin 07/05/2022   Chronic hypertension    Depressed mood 04/29/2020   Depression    see social work consult,  doing really well right now 'is in a good place" 3/29   Gestational diabetes    History of pre-eclampsia 07/05/2022   G1 39w, severe g2 @ w 36w delivery, ASA 81mg    Hx of varicella    Major depressive disorder with single episode, in partial remission (HCC) 11/30/2020   Obese    Panic disorder 08/03/2015   Status post repeat low transverse cesarean section 05/04/2021   Superficial thrombophlebitis of left upper extremity 06/02/2021   Vaginal Pap smear, abnormal     Past Surgical History:  Past Surgical History:  Procedure Laterality Date   BLADDER REPAIR N/A 08/29/2019   Procedure: BLADDER REPAIR;  Surgeon: Waynard Reeds, MD;  Location: [facility];  Service: Gynecology;  Laterality: N/A;   CESAREAN SECTION N/A 03/09/2012   Procedure: CESAREAN SECTION;  Surgeon: Freddrick March. Tenny Craw, MD;  Location: [facility]S;  Service: Obstetrics;  Laterality: N/A;   CESAREAN SECTION N/A 12/03/2014   Procedure: CESAREAN SECTION;  Surgeon: Marlow Baars, MD;  Location: [facility] ORS;  Service: Obstetrics;  Laterality: N/A;   CESAREAN SECTION N/A 08/29/2019   Procedure: CESAREAN SECTION;  Surgeon: Waynard Reeds, MD;  Location: [facility];  Service: Obstetrics;  Laterality: N/A;   CESAREAN SECTION N/A 05/04/2021   Procedure: CESAREAN SECTION;  Surgeon: Warden Fillers, MD;  Location: [facility];  Service: Obstetrics;  Laterality: N/A;   COLPOSCOPY     CYSTOSCOPY N/A 08/29/2019   Procedure: CYSTOSCOPY;  Surgeon: Waynard Reeds, MD;  Location: [facility];  Service: Gynecology;  Laterality: N/A;   LAPAROTOMY N/A 08/29/2019   Procedure: EXPLORATORY  LAPAROTOMY;  Surgeon: Waynard Reeds, MD;  Location: [facility] LD ORS;  Service: Gynecology;  Laterality: N/A;   TONSILLECTOMY     UMBILICAL HERNIA REPAIR N/A 06/21/2014   Procedure: EXPLORATORY LAPAROTOMY,  SMALL BOWEL RESECTION, PRIMARY REPAIR INCARCERATED VENTRAL HERNIA;  Surgeon: Darnell Level, MD;  Location: [facility] ORS;  Service: General;  Laterality: N/A;    Obstetrical History:  OB History      Gravida  9   Para  4   Term  2   Preterm  2   AB  4   Living  4      SAB  1   IAB  3   Ectopic  0   Multiple  0   Live Births  4           Gynecological History:  OB History     Gravida  9   Para  4   Term  2   Preterm  2   AB  4   Living  4      SAB  1   IAB  3   Ectopic  0   Multiple  0   Live Births  4           Social History:  Social History   Socioeconomic History   Marital status: Married    Spouse name: Not on file   Number of children: Not on file   Years of education: Not on file   Highest education level: Not on file  Occupational History   Not on file  Tobacco Use   Smoking status: Never   Smokeless tobacco: Never  Vaping Use   Vaping status: Never Used  Substance and Sexual Activity   Alcohol use: No   Drug use: No   Sexual activity: Yes    Birth control/protection: Condom, Rhythm  Other Topics Concern   Not on file  Social History Narrative   Not on file   Social Drivers of Health   Financial Resource Strain: Not on file  Food Insecurity: No Food Insecurity (02/06/2023)   Hunger Vital Sign    Worried About Running Out of Food in the Last Year: Never true    Ran Out of Food in the Last Year: Never true  Transportation Needs: No Transportation Needs (02/06/2023)   PRAPARE - Administrator, Civil Service (Medical): No    Lack of Transportation (Non-Medical): No  Physical Activity: Not on file  Stress: Not on file  Social Connections: Not on file    Family History:  Family History  Problem Relation Age of Onset   Anemia Mother    Hypertension Mother    Vision loss Father    Diabetes Father    Anemia Sister    Birth defects Brother        heart disorder?   ADD / ADHD Brother    Hypertension Maternal Grandmother    Diabetes Maternal Grandmother    Hypertension Maternal Grandfather    Diabetes Maternal Grandfather    Hypertension Paternal Grandmother    Diabetes Paternal  Grandmother    Hypertension Paternal Grandfather    Diabetes Paternal Grandfather    Hypertension Maternal Aunt    Stomach cancer Neg Hx    Colon cancer Neg Hx    Esophageal cancer Neg Hx    Pancreatic disease Neg Hx     Medications:  Prenatal vitamins,  Current Facility-Administered Medications  Medication Dose Route Frequency Provider Last Rate Last Admin  ceFAZolin (ANCEF) IVPB 2g/100 mL premix  2 g Intravenous On Call to OR Levie Heritage, DO       hydrALAZINE (APRESOLINE) injection 5 mg  5 mg Intravenous PRN Levie Heritage, DO   5 mg at 02/06/23 5784   And   hydrALAZINE (APRESOLINE) injection 10 mg  10 mg Intravenous PRN Levie Heritage, DO       And   labetalol (NORMODYNE) injection 20 mg  20 mg Intravenous PRN Levie Heritage, DO       And   labetalol (NORMODYNE) injection 40 mg  40 mg Intravenous PRN Levie Heritage, DO       lactated ringers infusion   Intravenous Continuous Levie Heritage, DO 125 mL/hr at 02/06/23 1105 New Bag at 02/06/23 1105   povidone-iodine 10 % swab 2 Application  2 Application Topical Once Roza Creamer J, DO       tranexamic acid (CYKLOKAPRON) IVPB 1,000 mg  1,000 mg Intravenous Once [place] J, DO        Allergies: No Known Allergies  Review of Systems: -  negative  Physical Exam: Blood pressure (!) 164/91, pulse 90, temperature 98.1 F (36.7 C), temperature source Oral, resp. rate 18, height 5\' 3"  (1.6 m), weight 134.3 kg, last menstrual period 05/22/2022, not currently breastfeeding. GENERAL: Well-developed, well-nourished female in no acute distress.  LUNGS: Clear to auscultation bilaterally.  HEART: Regular rate and rhythm. ABDOMEN: Soft, nontender, nondistended, gravid. EFW 7 lbs EXTREMITIES: Nontender, no edema, 2+ distal pulses.    Pertinent Labs/Studies:   Lab Results  Component Value Date   WBC 9.0 02/03/2023   HGB 10.3 (L) 02/03/2023   HCT 32.1 (L) 02/03/2023   MCV 80.3 02/03/2023   PLT 262 02/03/2023     Assessment : Adama L Journell is a 33 y.o. [id number] at [redacted]w[redacted]d being admitted for cesarean section secondary to DM2, HTN, prior cesarean delivery  Plan: The risks of cesarean section discussed with the patient included but were not limited to: bleeding which may require transfusion or reoperation; infection which may require antibiotics; injury to bowel, bladder, ureters or other surrounding organs; injury to the fetus; need for additional procedures including hysterectomy in the event of a life-threatening hemorrhage; placental abnormalities wth subsequent pregnancies, incisional problems, thromboembolic phenomenon and other postoperative/anesthesia complications. The patient concurred with the proposed plan, giving informed written consent for the procedure.   Patient has been NPO since last night and will remain NPO for procedure.  Preoperative prophylactic Ancef ordered on call to the OR.    Levie Heritage, DO 02/06/2023, 11:27 AM

## 2023-02-06 NOTE — Transfer of Care (Signed)
Immediate Anesthesia Transfer of Care Note  Patient: Marilyn Cooper  Procedure(s) Performed: CESAREAN SECTION (Abdomen) APPLICATION OF CELL SAVER  Patient Location: PACU  Anesthesia Type:Spinal and Epidural  Level of Consciousness: awake, alert , and oriented  Airway & Oxygen Therapy: Patient Spontanous Breathing  Post-op Assessment: Report given to RN and Post -op Vital signs reviewed and stable  Post vital signs: Reviewed and stable  Last Vitals:  Vitals Value Taken Time  BP 128/86 02/06/23 1351  Temp    Pulse 81 02/06/23 1356  Resp 19 02/06/23 1356  SpO2 99 % 02/06/23 1356  Vitals shown include unfiled device data.  Last Pain:  Vitals:   02/06/23 1044  TempSrc: Oral         Complications: No notable events documented.

## 2023-02-06 NOTE — Progress Notes (Signed)
Dr.Stinson notified that Maternal Pt reacts to Procardia with red neck/face with itching.MD to order an alternative.

## 2023-02-06 NOTE — Anesthesia Procedure Notes (Deleted)
Spinal  Patient location during procedure: OR Reason for block: surgical anesthesia Staffing Performed: other anesthesia staff  Performed by: Graciela Husbands, CRNA Authorized by: [name], DO   Preanesthetic Checklist Completed: patient identified, IV checked, site marked, risks and benefits discussed, surgical consent, monitors and equipment checked, pre-op evaluation and timeout performed Spinal Block Patient position: sitting Prep: DuraPrep Patient monitoring: cardiac monitor, continuous pulse ox, blood pressure and heart rate Approach: midline Location: L3-4 Injection technique: catheter Needle Needle type: Tuohy and Sprotte  Needle gauge: 24 G Needle length: 9 cm Catheter type: closed end flexible Catheter size: 19 g Assessment Events: CSF return

## 2023-02-06 NOTE — Anesthesia Procedure Notes (Deleted)
Epidural Patient location during procedure: OB  Staffing Performed: other anesthesia staff   Preanesthetic Checklist Completed: patient identified, IV checked, site marked, risks and benefits discussed, surgical consent, monitors and equipment checked, pre-op evaluation and timeout performed  Epidural Patient position: sitting Prep: DuraPrep and site prepped and draped Patient monitoring: continuous pulse ox, blood pressure and heart rate Approach: midline Location: L3-L4 Injection technique: LOR air  Needle:  Needle type: Tuohy  Needle gauge: 18 G Needle length: 9 cm and 9 Needle insertion depth: 7 cm Catheter type: closed end flexible Catheter size: 19 Gauge Catheter at skin depth: 10 cm  Assessment Events: blood not aspirated, no cerebrospinal fluid, injection not painful, no injection resistance, no paresthesia and negative IV test  Additional Notes CSEReason for block:surgical anesthesia

## 2023-02-07 LAB — CBC
HCT: 23.5 % — ABNORMAL LOW (ref 36.0–46.0)
HCT: 28.1 % — ABNORMAL LOW (ref 36.0–46.0)
Hemoglobin: 7.7 g/dL — ABNORMAL LOW (ref 12.0–15.0)
Hemoglobin: 9.4 g/dL — ABNORMAL LOW (ref 12.0–15.0)
MCH: 25.7 pg — ABNORMAL LOW (ref 26.0–34.0)
MCH: 26.4 pg (ref 26.0–34.0)
MCHC: 32.8 g/dL (ref 30.0–36.0)
MCHC: 33.5 g/dL (ref 30.0–36.0)
MCV: 78.3 fL — ABNORMAL LOW (ref 80.0–100.0)
MCV: 78.9 fL — ABNORMAL LOW (ref 80.0–100.0)
Platelets: 222 10*3/uL (ref 150–400)
Platelets: 236 10*3/uL (ref 150–400)
RBC: 3 MIL/uL — ABNORMAL LOW (ref 3.87–5.11)
RBC: 3.56 MIL/uL — ABNORMAL LOW (ref 3.87–5.11)
RDW: 14.2 % (ref 11.5–15.5)
RDW: 14.7 % (ref 11.5–15.5)
WBC: 11.1 10*3/uL — ABNORMAL HIGH (ref 4.0–10.5)
WBC: 8.8 10*3/uL (ref 4.0–10.5)
nRBC: 0 % (ref 0.0–0.2)
nRBC: 0 % (ref 0.0–0.2)

## 2023-02-07 LAB — COMPREHENSIVE METABOLIC PANEL
ALT: 16 U/L (ref 0–44)
AST: 39 U/L (ref 15–41)
Albumin: 2.8 g/dL — ABNORMAL LOW (ref 3.5–5.0)
Alkaline Phosphatase: 73 U/L (ref 38–126)
Anion gap: 13 (ref 5–15)
BUN: 13 mg/dL (ref 6–20)
CO2: 20 mmol/L — ABNORMAL LOW (ref 22–32)
Calcium: 9.6 mg/dL (ref 8.9–10.3)
Chloride: 105 mmol/L (ref 98–111)
Creatinine, Ser: 0.8 mg/dL (ref 0.44–1.00)
GFR, Estimated: >60 mL/min (ref >60)
Glucose, Bld: 122 mg/dL — ABNORMAL HIGH (ref 70–99)
Potassium: 3.8 mmol/L (ref 3.5–5.1)
Sodium: 138 mmol/L (ref 135–145)
Total Bilirubin: 0.3 mg/dL (ref 0.0–1.2)
Total Protein: 6.3 g/dL — ABNORMAL LOW (ref 6.5–8.1)

## 2023-02-07 LAB — TYPE AND SCREEN
ABO/RH(D): A POS
Antibody Screen: POSITIVE
Unit division: 0
Unit division: 0

## 2023-02-07 LAB — BPAM RBC
Blood Product Expiration Date: 202502182359
Blood Product Expiration Date: 202502212359
Unit Type and Rh: 6200
Unit Type and Rh: 6200

## 2023-02-07 LAB — GLUCOSE, CAPILLARY
Glucose-Capillary: 111 mg/dL — ABNORMAL HIGH (ref 70–99)
Glucose-Capillary: 93 mg/dL (ref 70–99)
Glucose-Capillary: 110 mg/dL — ABNORMAL HIGH (ref 70–99)
Glucose-Capillary: 133 mg/dL — ABNORMAL HIGH (ref 70–99)

## 2023-02-07 LAB — PREPARE RBC (CROSSMATCH)

## 2023-02-07 MED ORDER — LABETALOL HCL 5 MG/ML IV SOLN: 40.0000 mg | INTRAVENOUS | Status: DC | PRN

## 2023-02-07 MED ORDER — HYDRALAZINE HCL 20 MG/ML IJ SOLN: 10.0000 mg | INTRAMUSCULAR | Status: DC | PRN

## 2023-02-07 MED ORDER — FUROSEMIDE 10 MG/ML IJ SOLN
40.0000 mg | Freq: Once | INTRAMUSCULAR | Status: AC
  Administered 2023-02-07: 40 mg via INTRAVENOUS
  Filled 2023-02-07: qty 4

## 2023-02-07 MED ORDER — LABETALOL HCL 5 MG/ML IV SOLN: 20.0000 mg | INTRAVENOUS | Status: DC | PRN

## 2023-02-07 MED ORDER — LACTATED RINGERS IV SOLN: INTRAVENOUS | Status: AC

## 2023-02-07 MED ORDER — LABETALOL HCL 5 MG/ML IV SOLN: 80.0000 mg | INTRAVENOUS | Status: DC | PRN

## 2023-02-07 MED ORDER — LISINOPRIL 10 MG PO TABS
10.0000 mg | ORAL_TABLET | Freq: Once | ORAL | Status: AC
  Administered 2023-02-07: 10 mg via ORAL
  Filled 2023-02-07 (×2): qty 1

## 2023-02-07 MED ORDER — LISINOPRIL 10 MG PO TABS
10.0000 mg | ORAL_TABLET | Freq: Every day | ORAL | Status: DC
  Administered 2023-02-07: 10 mg via ORAL
  Filled 2023-02-07: qty 1

## 2023-02-07 MED ORDER — SODIUM CHLORIDE 0.9 % IV SOLN
500.0000 mg | Freq: Once | INTRAVENOUS | Status: AC
  Administered 2023-02-07: 500 mg via INTRAVENOUS
  Filled 2023-02-07: qty 25

## 2023-02-07 MED ORDER — SODIUM CHLORIDE 0.9% IV SOLUTION: Freq: Once | INTRAVENOUS | Status: AC

## 2023-02-07 MED ORDER — INSULIN ASPART 100 UNIT/ML IJ SOLN
0.0000 [IU] | Freq: Three times a day (TID) | INTRAMUSCULAR | Status: DC
  Administered 2023-02-07: 2 [IU] via SUBCUTANEOUS

## 2023-02-07 MED ORDER — LABETALOL HCL 5 MG/ML IV SOLN
20.0000 mg | INTRAVENOUS | Status: DC | PRN
  Administered 2023-02-07 – 2023-02-09 (×5): 20 mg via INTRAVENOUS
  Filled 2023-02-07 (×7): qty 4

## 2023-02-07 MED ORDER — MAGNESIUM SULFATE 40 GM/1000ML IV SOLN
2.0000 g/h | INTRAVENOUS | Status: DC
  Administered 2023-02-08: 2 g/h via INTRAVENOUS
  Filled 2023-02-07: qty 1000

## 2023-02-07 MED ORDER — HYDRALAZINE HCL 20 MG/ML IJ SOLN
10.0000 mg | INTRAMUSCULAR | Status: DC | PRN
  Administered 2023-02-10: 10 mg via INTRAVENOUS
  Filled 2023-02-07: qty 1

## 2023-02-07 MED ORDER — MAGNESIUM SULFATE BOLUS VIA INFUSION
6.0000 g | Freq: Once | INTRAVENOUS | Status: AC
  Administered 2023-02-08: 6 g via INTRAVENOUS
  Filled 2023-02-07: qty 1000

## 2023-02-07 MED ORDER — LISINOPRIL 10 MG PO TABS
20.0000 mg | ORAL_TABLET | Freq: Every day | ORAL | Status: DC
  Administered 2023-02-08 – 2023-02-10 (×3): 20 mg via ORAL
  Filled 2023-02-07 (×3): qty 2

## 2023-02-07 MED ORDER — LABETALOL HCL 5 MG/ML IV SOLN
40.0000 mg | INTRAVENOUS | Status: DC | PRN
  Administered 2023-02-07 – 2023-02-09 (×3): 40 mg via INTRAVENOUS
  Filled 2023-02-07 (×3): qty 8

## 2023-02-07 MED FILL — Heparin Sodium (Porcine) Inj 1000 Unit/ML: INTRAMUSCULAR | Qty: 30 | Status: AC

## 2023-02-07 MED FILL — Sodium Chloride IV Soln 0.9%: INTRAVENOUS | Qty: 1000 | Status: AC

## 2023-02-07 NOTE — Progress Notes (Addendum)
Post Partum Day 1 Subjective: no complaints, up ad lib, voiding, tolerating PO, and + flatus, denies dizziness, headache, visual changes, or ruq pain.  Objective: Blood pressure (!) 143/80, pulse 69, temperature 97.9 F (36.6 C), temperature source Oral, resp. rate 18, height 5\' 3"  (1.6 m), weight 134.3 kg, last menstrual period 05/22/2022, SpO2 98%, unknown if currently breastfeeding.  Physical Exam:  General: alert, cooperative, and appears stated age 33: appropriate Uterine Fundus: firm Incision: healing well, no significant drainage, no dehiscence DVT Evaluation: No evidence of DVT seen on physical exam.  Recent Labs    02/07/23 0445  HGB 7.7*  HCT 23.5*    Assessment/Plan: Plan for discharge tomorrow and Contraception Nexplanon in office. Continue to watch hemoglobin, will discuss management in rounds.   LOS: 1 day   Iantha Fallen, Student-MidWife 02/07/2023, 7:40 AM   Attestation of Supervision of Student:  I confirm that I have verified the information documented in the nurse midwife student's note and that I have also personally reperformed the history, physical exam and all medical decision making activities.  I have verified that all services and findings are accurately documented in this student's note; and I agree with management and plan as outlined in the documentation. I have also made any necessary editorial changes.  Post-op repeat C/S w/ large panus, DM and BMI 52. Prophylactic wound vac in place: Sealed well. No drainage in canister. Dressing CDI.   Symptomatic anemia w/ isoimmunization: Pt reports severe fatigue, mild HA. Has not noticed near-syncope but has only been out of bed briefly. Has required a transfusion in the past and feels the same. There is also the challenge of pt having antibodies making crossmatching blood more difficult. Discussed with attending. Discussed R/B/I of transfusion vs IV iron vs expectant management with pt. Attending feels that  I Unit of blood is indicated with pt being symptomatic, post-op, breastfeeding and difficult to cross match in an emergency. Blood is already cross matched. Pt strongly desires transfusion. Will also order Venofer.    CHTN Uncontrolled on Labetalol: Discussed need to improve blood pressure control. Pt reports significant and flushing with Procardia in the past. Will D/C Labetalol and start Lisinopril Discussed importance of avoiding pregnancy on Lisinopril. Pt plans Nexplanon at [facility] visit (for AUB and BC) and FOB plans Vasectomy. Reports mild HA. Low suspicion for Pre-E. Will see how HA feels after blood transfusion and pain meds.   T2DM: Currently on Metformin and SSI. Has not ben needing SSI. Will D/C and likely send pt home om Metformin. Has PCP top F/U with.   Dorathy Kinsman, CNM [facility] [employer], [facility] 02/07/2023 9:58 AM

## 2023-02-07 NOTE — Progress Notes (Signed)
POSTPARTUM PROGRESS NOTE  Subjective: Marilyn Cooper is a 33 y.o. [id number] s/p rLTCS at [redacted]w[redacted]d for cHTN, T2DM. Called by RN for severe range pressures. Patient denies HA, vision changes, RUQ, CP, SOB. Feels well. Notes that she has been on labetalol through pregnancy and procardia previously (although did not feel well on this). Started lisinopril PP as she plans no more pregnancies. Has been on magnesium PP with 2 of her other pregnancies.  Has gotten labetalol protocol this evening. IV labetalol x3 so far.  Objective: Blood pressure (!) 180/85, pulse 69, temperature 97.9 F (36.6 C), temperature source Axillary, resp. rate 18, height 5\' 3"  (1.6 m), weight 134.3 kg, last menstrual period 05/22/2022, SpO2 100%, unknown if currently breastfeeding.  Physical Exam:  General: alert, cooperative and no distress Chest: no respiratory distress Abdomen: soft, non-tender  Uterine Fundus: firm and at level of umbilicus Extremities: No calf swelling or tenderness  Trace edema  Recent Labs    02/07/23 0445  HGB 7.7*  HCT 23.5*    Assessment/Plan: Marilyn Cooper is a 33 y.o. [id number] s/p rLTCS at [redacted]w[redacted]d.  #cHTN w/SIPE: Now having persistently severe range pressures. Asymptomatic. - Increase lisinopril to 20 mg every day. Additional 10 mg given tonight - CBC, CMP now - Continue labetalol protocol - Start mag and transfer to antepartum unit. Plan for 24 hours of mag  #T2DM - Restart sliding scale  Marilyn Gavel, MD OB Fellow 02/07/2023 10:47 PM

## 2023-02-08 ENCOUNTER — Other Ambulatory Visit: Payer: Self-pay

## 2023-02-08 ENCOUNTER — Inpatient Hospital Stay (HOSPITAL_COMMUNITY): Payer: BC Managed Care – PPO

## 2023-02-08 LAB — COMPREHENSIVE METABOLIC PANEL
ALT: 19 U/L (ref 0–44)
AST: 33 U/L (ref 15–41)
Albumin: 2.7 g/dL — ABNORMAL LOW (ref 3.5–5.0)
Alkaline Phosphatase: 72 U/L (ref 38–126)
Anion gap: 10 (ref 5–15)
BUN: 8 mg/dL (ref 6–20)
CO2: 21 mmol/L — ABNORMAL LOW (ref 22–32)
Calcium: 8.5 mg/dL — ABNORMAL LOW (ref 8.9–10.3)
Chloride: 107 mmol/L (ref 98–111)
Creatinine, Ser: 0.55 mg/dL (ref 0.44–1.00)
GFR, Estimated: >60 mL/min (ref >60)
Glucose, Bld: 121 mg/dL — ABNORMAL HIGH (ref 70–99)
Potassium: 3.9 mmol/L (ref 3.5–5.1)
Sodium: 138 mmol/L (ref 135–145)
Total Bilirubin: 0.3 mg/dL (ref 0.0–1.2)
Total Protein: 6.2 g/dL — ABNORMAL LOW (ref 6.5–8.1)

## 2023-02-08 LAB — LACTIC ACID, PLASMA
Lactic Acid, Venous: 1.1 mmol/L (ref 0.5–1.9)
Lactic Acid, Venous: 1 mmol/L (ref 0.5–1.9)

## 2023-02-08 LAB — CBC
HCT: 30.9 % — ABNORMAL LOW (ref 36.0–46.0)
Hemoglobin: 10.2 g/dL — ABNORMAL LOW (ref 12.0–15.0)
MCH: 26 pg (ref 26.0–34.0)
MCHC: 33 g/dL (ref 30.0–36.0)
MCV: 78.8 fL — ABNORMAL LOW (ref 80.0–100.0)
Platelets: 266 10*3/uL (ref 150–400)
RBC: 3.92 MIL/uL (ref 3.87–5.11)
RDW: 14.7 % (ref 11.5–15.5)
WBC: 10.3 10*3/uL (ref 4.0–10.5)
nRBC: 0 % (ref 0.0–0.2)

## 2023-02-08 LAB — MAGNESIUM: Magnesium: 3.7 mg/dL — ABNORMAL HIGH (ref 1.7–2.4)

## 2023-02-08 LAB — GLUCOSE, CAPILLARY
Glucose-Capillary: 109 mg/dL — ABNORMAL HIGH (ref 70–99)
Glucose-Capillary: 111 mg/dL — ABNORMAL HIGH (ref 70–99)
Glucose-Capillary: 95 mg/dL (ref 70–99)

## 2023-02-08 MED ORDER — IOHEXOL 350 MG/ML SOLN
75.0000 mL | Freq: Once | INTRAVENOUS | Status: AC | PRN
  Administered 2023-02-08: 75 mL via INTRAVENOUS

## 2023-02-08 MED ORDER — LABETALOL HCL 200 MG PO TABS
200.0000 mg | ORAL_TABLET | Freq: Two times a day (BID) | ORAL | Status: DC
  Administered 2023-02-08: 200 mg via ORAL
  Filled 2023-02-08: qty 1

## 2023-02-08 MED ORDER — METOCLOPRAMIDE HCL 5 MG/ML IJ SOLN
5.0000 mg | Freq: Four times a day (QID) | INTRAMUSCULAR | Status: DC | PRN
  Administered 2023-02-08: 5 mg via INTRAVENOUS
  Filled 2023-02-08: qty 2

## 2023-02-08 MED ORDER — LABETALOL HCL 200 MG PO TABS
400.0000 mg | ORAL_TABLET | Freq: Two times a day (BID) | ORAL | Status: DC
  Administered 2023-02-09 – 2023-02-10 (×3): 400 mg via ORAL
  Filled 2023-02-08 (×3): qty 2

## 2023-02-08 MED ORDER — IBUPROFEN 600 MG PO TABS: 600.0000 mg | ORAL_TABLET | Freq: Four times a day (QID) | ORAL | Status: DC | PRN

## 2023-02-08 MED ORDER — LACTATED RINGERS IV SOLN: INTRAVENOUS | Status: DC

## 2023-02-08 MED ORDER — HYDROMORPHONE HCL 1 MG/ML IJ SOLN
1.0000 mg | INTRAMUSCULAR | Status: DC | PRN
  Administered 2023-02-08 (×2): 2 mg via INTRAVENOUS
  Filled 2023-02-08 (×2): qty 2

## 2023-02-08 MED ORDER — DIATRIZOATE MEGLUMINE & SODIUM 66-10 % PO SOLN
90.0000 mL | Freq: Once | ORAL | Status: AC
  Administered 2023-02-09: 90 mL via NASOGASTRIC
  Filled 2023-02-08: qty 90

## 2023-02-08 NOTE — Progress Notes (Signed)
Post Partum Day 2 s/p RLTCS/BTS Subjective: Patient with new onset upper abdominal pain in the area of her hernia (known hernia s/p native tissue repair by Dr. Gerrit Friends in 2026). Pain is associated with nausea and vomiting. Felt some numbness in hands but was bilateral. She denies BM but notes flatus.   Objective: Blood pressure (!) 156/76, pulse 74, temperature (!) 97.5 F (36.4 C), temperature source Oral, resp. rate 16, height 5\' 3"  (1.6 m), weight 134.3 kg, last menstrual period 05/22/2022, SpO2 97%, unknown if currently breastfeeding.  Physical Exam:  General: alert, cooperative, and mild distress Lochia: appropriate Uterine Fundus: firm Incision: wound vac in place Abdomen: tender in the area of her hernia which is large and both above her umbilicus and periumbilical.  DVT Evaluation: No evidence of DVT seen on physical exam.     Latest Ref Rng & Units 02/08/2023    2:19 PM 02/07/2023   10:51 PM 02/07/2023    4:45 AM  CBC  WBC 4.0 - 10.5 K/uL 10.3  8.8  11.1   Hemoglobin 12.0 - 15.0 g/dL 53.6  9.4  7.7   Hematocrit 36.0 - 46.0 % 30.9  28.1  23.5   Platelets 150 - 400 K/uL 266  236  222       Latest Ref Rng & Units 02/08/2023    2:19 PM 02/07/2023   10:51 PM 02/06/2023   11:07 AM  CMP  Glucose 70 - 99 mg/dL 644  034  742   BUN 6 - 20 mg/dL 8  13  5    Creatinine 0.44 - 1.00 mg/dL 5.95  6.38  7.56   Sodium 135 - 145 mmol/L 138  138  135   Potassium 3.5 - 5.1 mmol/L 3.9  3.8  3.7   Chloride 98 - 111 mmol/L 107  105  107   CO2 22 - 32 mmol/L 21  20  17    Calcium 8.9 - 10.3 mg/dL 8.5  9.6  8.5   Total Protein 6.5 - 8.1 g/dL 6.2  6.3  7.0   Total Bilirubin 0.0 - 1.2 mg/dL 0.3  0.3  <4.3   Alkaline Phos 38 - 126 U/L 72  73  88   AST 15 - 41 U/L 33  39  13   ALT 0 - 44 U/L 19  16  10     Mag level 3.7  Assessment/Plan: Acute onset pain - Initially d/t N/V I had concern for Magnesium toxicity, but her mag level was normal and her CMP is unremarkable. CBC was checked to ensure no  internal bleeding but this was normal with HgB 10.2 which is stable from yesterday. Given that her pain is in the area of her hernia, I spoke with general surgery team. They recommended CT A/P with IV and PO contrast. We will contact the on call physician (Dr. Azucena Cecil) once the results are back. I will keep her NPO until after the CT.  - IV dilaudid order placed and anti-emetics ordered.    LOS: 2 days   Milas Hock 02/08/2023, 4:12 PM

## 2023-02-08 NOTE — Progress Notes (Signed)
MOB was referred for history of depression/anxiety. * Referral screened out by Clinical Social Worker because none of the following criteria appear to apply: ~ History of anxiety/depression during this pregnancy, or of post-partum depression following prior delivery. ~ Diagnosis of anxiety and/or depression within last 3 years OR * MOB's symptoms currently being treated with medication and/or therapy. Per chart review, MOB is currently prescribed/taking Lexapro.  *CSW screening out consult for "FOB laid off during pregnancy". Per chart review, MOB addressed this stressor during IBH appointment and no financial stressors are noted in the chart.    Please contact the Clinical Social Worker if needs arise, by [facility] request, or if MOB scores greater than 9/yes to question 10 on Edinburgh Postpartum Depression Screen.  Celso Sickle, LCSW Clinical Social Worker [facility] Cell#: [phone]

## 2023-02-08 NOTE — Progress Notes (Signed)
This RN conferred with Dr. Earlene Plater, who made the decision to transfer this patient to [facility] due to elevated BP in the severe ranges and 3 doses of PRN IV labetalol. Patient was transferred to [facility] into the care of Lenoria Farrier RN at 2330. Last CBG was 110 after treatment with 2 units of Novalog and last BP before transfer was 138/79. Patient was left in stable condition and report given to [facility] RN with all updates on occurrences including blood transfusion, Venofer transfusion, 3 doses of labetalol and associated BP, insulin dosage and administration of lisinopril.

## 2023-02-08 NOTE — Progress Notes (Signed)
Reexamined patient, abdominal exam stable. Patient revealed during this examination that she has similar episodes of this pain/discomfort/N/V 1-2 a week and has one severe episode that brings her to tears/debilitates her once a month or so. Although still concerning, is somewhat reassuring that there is not an acute abdominal emergency.   Her initial lactate was 1.1 and repeat 1.0. Vitals remain stable. Patient appears somewhat dehydrated (dry MM) on exam but UOP has picked up with IVF.  May need bolus if UOP drops off. Continues to pass flatus.  NGT placed, will decompress then begin enteral contrast. Patient, husband, bedside RN and charge RN all know to page me if any acute changes or if patient does not tolerate clamping with enteral contrast.

## 2023-02-08 NOTE — Consult Note (Signed)
Marilyn Cooper November 29, 1990  [id number].    Requesting MD: Para March, MD Chief Complaint/Reason for Consult: SBO, incisional hernia   HPI:  Marilyn Cooper is a 33 y/o F with PMH diabetes, HTN, depression, multiple abdominal surgeries, listed below,  and known ventral incisional hernia who was admitted to the hospital 02/06/23 for repeat cesarean section. On POD#2 she developed tenderness over her ventral hernia along with vomiting. Labs as well as CT scan of the abdomen were obtained that initially read as possible enteritis and ileus with mild small bowel thickening, possible developing SBO from adhesive disease and small pockets of pneumoperitoneum likely post surgical. No PO contrast noted on scan.  Patient states that she initially was doing well post caesarean section, having BM and passing flatus but starting earlier today, began to have abdominal tenderness and nausea. This prompting CT scan. Confirmed with patient she was not given PO contrast.  She states she is continuing to pass flatus but has not had BM since yesterday.  WBC 10.3, within normal limits.  Surgical history: 2014 c- section 2016 ex lap, SBR, primary umbilical hernia repair for incarcerated umbilical hernia (Dr. Gerrit Friends) 2016- c-section 2021 c- section, complicated by bladder injury requiring take back to OR, ex lap, bladder repair  2023 c-section   ROS: Review of Systems  All other systems reviewed and are negative.   Family History  Problem Relation Age of Onset   Anemia Mother    Hypertension Mother    Vision loss Father    Diabetes Father    Anemia Sister    Birth defects Brother        heart disorder?   ADD / ADHD Brother    Hypertension Maternal Grandmother    Diabetes Maternal Grandmother    Hypertension Maternal Grandfather    Diabetes Maternal Grandfather    Hypertension Paternal Grandmother    Diabetes Paternal Grandmother    Hypertension Paternal Grandfather    Diabetes Paternal  Grandfather    Hypertension Maternal Aunt    Stomach cancer Neg Hx    Colon cancer Neg Hx    Esophageal cancer Neg Hx    Pancreatic disease Neg Hx     Past Medical History:  Diagnosis Date   Anemia    Anxiety    Asthma    childhood   Binge eating disorder 07/05/2022   Candidiasis of skin 07/05/2022   Chronic hypertension    Depressed mood 04/29/2020   Depression    see social work consult, doing really well right now 'is in a good place" 3/29   History of pre-eclampsia 07/05/2022   G1 39w, severe g2 @ w 36w delivery, ASA 81mg    Hx of varicella    Major depressive disorder with single episode, 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 Tenny Craw, MD;  Location: [facility];  Service: Obstetrics;  Laterality: N/A;   CESAREAN SECTION N/A 12/03/2014   Procedure: CESAREAN SECTION;  Surgeon: Marlow Baars, MD;  Location: [facility];  Service: Obstetrics;  Laterality: N/A;   CESAREAN SECTION N/A 08/29/2019   Procedure: CESAREAN SECTION;  Surgeon: Waynard Reeds, MD;  Location: [facility];  Service: Obstetrics;  Laterality: N/A;   CESAREAN SECTION N/A 05/04/2021   Procedure: CESAREAN SECTION;  Surgeon: Warden Fillers, MD;  Location: [facility];  Service: Obstetrics;  Laterality: N/A;   CESAREAN SECTION N/A 02/06/2023   Procedure: CESAREAN SECTION;  Surgeon: Levie Heritage, DO;  Location: [facility];  Service: Obstetrics;  Laterality: N/A;   COLPOSCOPY     CYSTOSCOPY N/A 08/29/2019   Procedure:  CYSTOSCOPY;  Surgeon: Waynard Reeds, MD;  Location: [facility];  Service: Gynecology;  Laterality: N/A;   LAPAROTOMY N/A 08/29/2019   Procedure: EXPLORATORY LAPAROTOMY;  Surgeon: Waynard Reeds, MD;  Location: [facility];  Service: Gynecology;  Laterality: N/A;   TONSILLECTOMY     UMBILICAL HERNIA REPAIR N/A 06/21/2014   Procedure: EXPLORATORY LAPAROTOMY,  SMALL BOWEL RESECTION, PRIMARY REPAIR INCARCERATED VENTRAL HERNIA;  Surgeon: Darnell Level, MD;  Location: [facility];  Service: General;  Laterality: N/A;    Social History:  reports that she has never smoked. She has never used smokeless tobacco. She reports that she does not drink alcohol and does not use drugs.  Allergies:  Allergies  Allergen Reactions   Procardia [Nifedipine] Itching and Rash    Intolerance, pt reports itching and facial/neck redness    Medications Prior to Admission  Medication Sig Dispense Refill   aspirin EC 81 MG tablet Take 1 tablet (81 mg total) by mouth daily. Swallow whole. 30 tablet 5   cetirizine (ZYRTEC) 10 MG tablet Take 10 mg by mouth daily as needed for allergies.     escitalopram (LEXAPRO) 10 MG tablet Take 1 tablet (10 mg total) by mouth daily. 30 tablet 3   ferrous sulfate 325 (65 FE) MG tablet Take 325 mg by mouth daily with breakfast.     insulin NPH Human (NOVOLIN N) 100 UNIT/ML injection Inject 0.08 mLs (8 Units total) into the skin at bedtime. 10 mL 11   labetalol (NORMODYNE) 300 MG tablet Take 2 tablets (600 mg total) by mouth 2 (two) times daily. 120 tablet 1   metFORMIN (GLUCOPHAGE) 500 MG tablet Take 2 tablets (1,000 mg total) by mouth daily with breakfast. 60 tablet 5   Prenatal Vit-Fe Fumarate-FA (PRENATAL VITAMIN PO) Take 1 tablet by mouth daily.     Accu-Chek Softclix Lancets lancets 100 each by Other route 4 (four) times daily. 100 each 12   Blood Glucose Monitoring Suppl (ACCU-CHEK GUIDE) w/Device KIT 1 kit by Does not apply route daily. 1 kit 0   glucose blood (ACCU-CHEK GUIDE) test strip  Four times daily 100 each 12   INSULIN SYRINGE 1CC/29G 29G X 1/2" 1 ML MISC Take as directed 50 each 5     Physical Exam: Blood pressure (!) 156/76, pulse 74, temperature (!) 97.5 F (36.4 C), temperature source Oral, resp. rate 16, height 5\' 3"  (1.6 m), weight 134.3 kg, last menstrual period 05/22/2022, SpO2 97%, unknown if currently breastfeeding. General: Pleasant morbidly obese female laying on hospital bed, appears stated age, NAD. HEENT: head -normocephalic, atraumatic; Eyes: PERRLA, no conjunctival injection; mucous membranes dry Neck- Trachea is midline CV- Regular rate and rhythm Pulm- breathing is non-labored on room air, equal chest rise Abd- soft, nondistended, bowel containing ventral hernia, mild tenderness to palpation, no rebound, no guarding, no peritoneal signs. GU- deferred  MSK- UE/LE symmetrical, no  cyanosis, clubbing, or edema. Neuro- No focal neurologic deficits Psych- Alert and Oriented x3 with appropriate affect Skin: warm and dry, no rashes or lesions   Results for orders placed or performed during the hospital encounter of 02/06/23 (from the past 48 hours)  Glucose, capillary     Status: Abnormal   Collection Time: 02/06/23  6:26 PM  Result Value Ref Range   Glucose-Capillary 126 (H) 70 - 99 mg/dL    Comment: Glucose reference range applies only to samples taken after fasting for at least 8 hours.  Glucose, capillary     Status: Abnormal   Collection Time: 02/06/23 10:15 PM  Result Value Ref Range   Glucose-Capillary 117 (H) 70 - 99 mg/dL    Comment: Glucose reference range applies only to samples taken after fasting for at least 8 hours.  CBC     Status: Abnormal   Collection Time: 02/07/23  4:45 AM  Result Value Ref Range   WBC 11.1 (H) 4.0 - 10.5 K/uL   RBC 3.00 (L) 3.87 - 5.11 MIL/uL   Hemoglobin 7.7 (L) 12.0 - 15.0 g/dL   HCT 19.1 (L) 47.8 - 29.5 %   MCV 78.3 (L) 80.0 - 100.0 fL   MCH 25.7 (L) 26.0 - 34.0 pg   MCHC 32.8 30.0 - 36.0 g/dL   RDW  62.1 30.8 - 65.7 %   Platelets 222 150 - 400 K/uL   nRBC 0.0 0.0 - 0.2 %    Comment: Performed at [facility], [address], [place], [place] [address]  Glucose, capillary     Status: Abnormal   Collection Time: 02/07/23  5:32 AM  Result Value Ref Range   Glucose-Capillary 111 (H) 70 - 99 mg/dL    Comment: Glucose reference range applies only to samples taken after fasting for at least 8 hours.  Glucose, capillary     Status: None   Collection Time: 02/07/23  9:29 AM  Result Value Ref Range   Glucose-Capillary 93 70 - 99 mg/dL    Comment: Glucose reference range applies only to samples taken after fasting for at least 8 hours.  Prepare RBC (crossmatch)     Status: None   Collection Time: 02/07/23  3:34 PM  Result Value Ref Range   Order Confirmation      ORDER PROCESSED BY BLOOD BANK Performed at [facility], [address], [place], [place] [address]   Type and screen [facility]     Status: None (Preliminary result)   Collection Time: 02/07/23  4:10 PM  Result Value Ref Range   ABO/RH(D) A POS    Antibody Screen POS    Sample Expiration 02/10/2023,2359    Antibody Identification ANTI FYA (Duffy a) ANTI E    Unit Number [id number]    Blood Component Type RED CELLS,LR    Unit division 00    Status of Unit ISSUED,FINAL    Transfusion Status OK TO TRANSFUSE    Crossmatch Result COMPATIBLE    Unit Number [id number]    Blood Component Type RED CELLS,LR    Unit division 00    Status of Unit ALLOCATED    Transfusion Status OK TO TRANSFUSE    Crossmatch Result COMPATIBLE   Glucose, capillary     Status: Abnormal   Collection Time: 02/07/23 10:35 PM  Result Value Ref Range   Glucose-Capillary 133 (H) 70 - 99 mg/dL    Comment: Glucose reference range applies only to samples taken  after fasting for at least 8 hours.  CBC     Status: Abnormal   Collection Time: 02/07/23 10:51 PM  Result Value Ref Range   WBC 8.8 4.0 - 10.5 K/uL   RBC 3.56  (L) 3.87 - 5.11 MIL/uL   Hemoglobin 9.4 (L) 12.0 - 15.0 g/dL   HCT 57.8 (L) 46.9 - 62.9 %   MCV 78.9 (L) 80.0 - 100.0 fL   MCH 26.4 26.0 - 34.0 pg   MCHC 33.5 30.0 - 36.0 g/dL   RDW 52.8 41.3 - 24.4 %   Platelets 236 150 - 400 K/uL   nRBC 0.0 0.0 - 0.2 %    Comment: Performed at [facility], [address] [place], [place] [address]  Comprehensive metabolic panel     Status: Abnormal   Collection Time: 02/07/23 10:51 PM  Result Value Ref Range   Sodium 138 135 - 145 mmol/L   Potassium 3.8 3.5 - 5.1 mmol/L   Chloride 105 98 - 111 mmol/L   CO2 20 (L) 22 - 32 mmol/L   Glucose, Bld 122 (H) 70 - 99 mg/dL    Comment: Glucose reference range applies only to samples taken after fasting for at least 8 hours.   BUN 13 6 - 20 mg/dL   Creatinine, Ser 2.53 0.44 - 1.00 mg/dL   Calcium 9.6 8.9 - 66.4 mg/dL   Total Protein 6.3 (L) 6.5 - 8.1 g/dL   Albumin 2.8 (L) 3.5 - 5.0 g/dL   AST 39 15 - 41 U/L   ALT 16 0 - 44 U/L   Alkaline Phosphatase 73 38 - 126 U/L   Total Bilirubin 0.3 0.0 - 1.2 mg/dL   GFR, Estimated >40 >34 mL/min    Comment: (NOTE) Calculated using the CKD-EPI Creatinine Equation (2021)    Anion gap 13 5 - 15    Comment: Performed at [facility], [address], [place], [place] [address]  Glucose, capillary     Status: Abnormal   Collection Time: 02/07/23 11:25 PM  Result Value Ref Range   Glucose-Capillary 110 (H) 70 - 99 mg/dL    Comment: Glucose reference range applies only to samples taken after fasting for at least 8 hours.  Glucose, capillary     Status: Abnormal   Collection Time: 02/08/23  8:05 AM  Result Value Ref Range   Glucose-Capillary 109 (H) 70 - 99 mg/dL    Comment: Glucose reference range applies only to samples taken after fasting for at least 8 hours.  Glucose, capillary     Status: Abnormal   Collection Time: 02/08/23  1:18 PM  Result Value Ref Range   Glucose-Capillary 111 (H) 70 - 99 mg/dL    Comment: Glucose reference range applies  only to samples taken after fasting for at least 8 hours.  CBC     Status: Abnormal   Collection Time: 02/08/23  2:19 PM  Result Value Ref Range   WBC 10.3 4.0 - 10.5 K/uL   RBC 3.92 3.87 - 5.11 MIL/uL   Hemoglobin 10.2 (L) 12.0 - 15.0 g/dL   HCT 56.3 (L) 87.5 - 64.3 %   MCV 78.8 (L) 80.0 - 100.0 fL   MCH 26.0 26.0 - 34.0 pg   MCHC 33.0 30.0 - 36.0 g/dL   RDW 32.9 51.8 - 84.1 %   Platelets 266 150 - 400 K/uL   nRBC 0.0 0.0 - 0.2 %    Comment: Performed at [facility],  [address], [place], [place] [address]  Comprehensive metabolic panel     Status: Abnormal   Collection Time: 02/08/23  2:19 PM  Result Value Ref Range   Sodium 138 135 - 145 mmol/L   Potassium 3.9 3.5 - 5.1 mmol/L   Chloride 107 98 - 111 mmol/L   CO2 21 (L) 22 - 32 mmol/L   Glucose, Bld 121 (H) 70 - 99 mg/dL    Comment: Glucose reference range applies only to samples taken after fasting for at least 8 hours.   BUN 8 6 - 20 mg/dL   Creatinine, Ser 7.56 0.44 - 1.00 mg/dL   Calcium 8.5 (L) 8.9 - 10.3 mg/dL   Total Protein 6.2 (L) 6.5 - 8.1 g/dL   Albumin 2.7 (L) 3.5 - 5.0 g/dL   AST 33 15 - 41 U/L   ALT 19 0 - 44 U/L   Alkaline Phosphatase 72 38 - 126 U/L   Total Bilirubin 0.3 0.0 - 1.2 mg/dL   GFR, Estimated >43 >32 mL/min    Comment: (NOTE) Calculated using the CKD-EPI Creatinine Equation (2021)    Anion gap 10 5 - 15    Comment: Performed at [facility], [address] [place], [place] [address]  Magnesium     Status: Abnormal   Collection Time: 02/08/23  2:19 PM  Result Value Ref Range   Magnesium 3.7 (H) 1.7 - 2.4 mg/dL    Comment: Performed at [facility], [address], [place], [place] [address]   No results found.    Assessment/Plan 33 yo female with HTN, DM, morbid obesity, depression and complex surgical history with ventral incisional hernia and SBO who is POD 2 from c-section.  - NGT placement, LIWS - Small bowel obstruction protocol--orders placed - Lactic acid  STAT now and repeat in 3 hours - NPO, IVF  I personally reviewed imaging radiology read. I called reading physician, Dr. Gwenyth Bender directly to discuss imaging. The small area of bowel thickening may be reactive but on repeat review cannot rule out early pneumatosis. No definitive signs of ischemia or bowel compromise.  Given patient's complex surgical history and BMI, would anticipate exploratory laparotomy to be high risk for complications including bleeding, infection, abscess, recurrence, possible need for temporary wound closure device, damage to surrounding structures with high risk of unavoidable enterotomies. Given reassuring physical exam reasonable to try nonoperative approach. Discussed with my partner Dr. Derrell Lolling for second opinion and agrees reasonable to observe patient with SBOP, lactic acid and serial exams.  Discussed plan directly with OBGYN faculty Dr. Donavan Foil, bedside RN and charge RN.  If any change in clinical status, concerning vitals (hypotension/tachycardia) or labs (significantly elevated initial lactic acid or uptrending LA on serial labs), or acute worsening of abdominal exam may require emergent surgery overnight.   Additionally, even if remains clinically stable with reassuring labs, vitals and abdominal exam, may still require exploratory laparotomy if SBOP reveals persistent SBO.  Discussed with patient and her husband at bedside about plan. They voice understanding and are agreeable to plan. Patient also states she would agree to surgery if indicated.   I reviewed last 24 h vitals and pain scores, last 48 h intake and output, last 24 h labs and trends, last 24 h imaging results, and OBGYN provider notes .  This required high level of medical decision making.  Donata Duff, MD [facility]

## 2023-02-08 NOTE — Progress Notes (Signed)
Subjective: Postpartum Day 2: Cesarean Delivery Patient reports feeling well. She denies HA, visual cahnges, RUQ/epigastric pain, nausea or emesis.  Patient is ambulating, voiding and tolerating a regular diet  Objective: Vital signs in last 24 hours: Temp:  [97.5 F (36.4 C)-98.1 F (36.7 C)] 97.8 F (36.6 C) (01/22 0432) Pulse Rate:  [61-85] 73 (01/22 0715) Resp:  [14-18] 16 (01/22 0715) BP: (117-180)/(52-85) 143/66 (01/22 0715) SpO2:  [96 %-100 %] 96 % (01/22 0101)  Physical Exam:  General: alert, cooperative, and no distress Lochia: appropriate Uterine Fundus: firm Incision: Prevena wound vac in place, intact and functioning DVT Evaluation: No evidence of DVT seen on physical exam.  Recent Labs    02/07/23 0445 02/07/23 2251  HGB 7.7* 9.4*  HCT 23.5* 28.1*    Assessment/Plan: Status post Cesarean section. Doing well postoperatively.  Will continue magnesium for seizure prophylaxis given severe range BP overnight Continue antihypertensive Continue close monitoring Continue current care.  Catalina Antigua, MD 02/08/2023, 7:57 AM

## 2023-02-09 ENCOUNTER — Inpatient Hospital Stay (HOSPITAL_COMMUNITY): Payer: BC Managed Care – PPO

## 2023-02-09 ENCOUNTER — Encounter: Payer: BC Managed Care – PPO | Admitting: Obstetrics and Gynecology

## 2023-02-09 ENCOUNTER — Other Ambulatory Visit: Payer: Self-pay | Admitting: Obstetrics & Gynecology

## 2023-02-09 ENCOUNTER — Encounter (HOSPITAL_COMMUNITY): Payer: Self-pay | Admitting: Family Medicine

## 2023-02-09 DIAGNOSIS — Z9049 Acquired absence of other specified parts of digestive tract: Secondary | ICD-10-CM

## 2023-02-09 DIAGNOSIS — I10 Essential (primary) hypertension: Secondary | ICD-10-CM

## 2023-02-09 LAB — COMPREHENSIVE METABOLIC PANEL
ALT: 18 U/L (ref 0–44)
AST: 24 U/L (ref 15–41)
Albumin: 2.5 g/dL — ABNORMAL LOW (ref 3.5–5.0)
Alkaline Phosphatase: 65 U/L (ref 38–126)
Anion gap: 9 (ref 5–15)
BUN: 8 mg/dL (ref 6–20)
CO2: 22 mmol/L (ref 22–32)
Calcium: 8.2 mg/dL — ABNORMAL LOW (ref 8.9–10.3)
Chloride: 108 mmol/L (ref 98–111)
Creatinine, Ser: 0.59 mg/dL (ref 0.44–1.00)
GFR, Estimated: >60 mL/min (ref >60)
Glucose, Bld: 169 mg/dL — ABNORMAL HIGH (ref 70–99)
Potassium: 3.7 mmol/L (ref 3.5–5.1)
Sodium: 139 mmol/L (ref 135–145)
Total Bilirubin: 0.3 mg/dL (ref 0.0–1.2)
Total Protein: 5.8 g/dL — ABNORMAL LOW (ref 6.5–8.1)

## 2023-02-09 LAB — CBC
HCT: 30.1 % — ABNORMAL LOW (ref 36.0–46.0)
Hemoglobin: 9.8 g/dL — ABNORMAL LOW (ref 12.0–15.0)
MCH: 26.2 pg (ref 26.0–34.0)
MCHC: 32.6 g/dL (ref 30.0–36.0)
MCV: 80.5 fL (ref 80.0–100.0)
Platelets: 245 10*3/uL (ref 150–400)
RBC: 3.74 MIL/uL — ABNORMAL LOW (ref 3.87–5.11)
RDW: 14.9 % (ref 11.5–15.5)
WBC: 9.1 10*3/uL (ref 4.0–10.5)
nRBC: 0 % (ref 0.0–0.2)

## 2023-02-09 LAB — GLUCOSE, CAPILLARY
Glucose-Capillary: 101 mg/dL — ABNORMAL HIGH (ref 70–99)
Glucose-Capillary: 82 mg/dL (ref 70–99)
Glucose-Capillary: 101 mg/dL — ABNORMAL HIGH (ref 70–99)

## 2023-02-09 MED ORDER — HYDRALAZINE HCL 20 MG/ML IJ SOLN: 10.0000 mg | INTRAMUSCULAR | Status: DC | PRN

## 2023-02-09 MED ORDER — LACTATED RINGERS IV BOLUS
1000.0000 mL | Freq: Once | INTRAVENOUS | Status: AC
  Administered 2023-02-09: 1000 mL via INTRAVENOUS

## 2023-02-09 MED ORDER — BENZOCAINE 20 % MT AERO: INHALATION_SPRAY | Freq: Two times a day (BID) | OROMUCOSAL | Status: DC | PRN

## 2023-02-09 MED ORDER — LABETALOL HCL 5 MG/ML IV SOLN: 80.0000 mg | INTRAVENOUS | Status: DC | PRN

## 2023-02-09 MED ORDER — LABETALOL HCL 5 MG/ML IV SOLN: 40.0000 mg | INTRAVENOUS | Status: DC | PRN

## 2023-02-09 MED ORDER — LABETALOL HCL 5 MG/ML IV SOLN: 20.0000 mg | INTRAVENOUS | Status: DC | PRN

## 2023-02-09 NOTE — Lactation Note (Signed)
This note was copied from a baby's chart. Lactation Consultation Note  Patient Name: Marilyn Cooper [id number] Date: 02/09/2023 Age:32 hours Reason for consult: Initial assessment;Early term 37-38.6wks;Maternal endocrine disorder  Visited with family of 14 hours old ETI female; Marilyn Cooper has been pumping for baby "Marilyn Cooper" her milk already came in, praised her for her efforts. She requested a NS since she used one with her other babies, she's afraid that Marilyn Cooper might have nipple confusion since she's been on bottles shortly after birth. Resized her flanges to # 18 and provided a # 20 NS but when this LC offered latch assistance Marilyn Cooper voiced that baby just ate and that she wasn't due for another feeding. She feels comfortable practicing independent breastfeeding as she breastfed her other kids, she's a P5. Reviewed normal ETI behavior, feeding cues, pumping schedule, pumping log, supplementation and anticipatory guidelines.  Maternal Data Has patient been taught Hand Expression?: Yes Does the patient have breastfeeding experience prior to this delivery?: Yes How long did the patient breastfeed?: 4-8 months with her other babies  Feeding Mother's Current Feeding Choice: Breast Milk and Formula  Lactation Tools Discussed/Used Tools: Pump;Coconut oil;Flanges;Nipple Shields Nipple shield size: 20 Flange Size: 18 Breast pump type: Manual;Double-Electric Breast Pump Pump Education: Setup, frequency, and cleaning;Milk Storage Reason for Pumping: mother's choice, supplementation to breastfeeding Pumping frequency: q 3 hours (recommended) Pumped volume: 60 mL  Interventions Interventions: Breast feeding basics reviewed;Coconut oil;Education;LC Services brochure;Hand pump  Plan Encouraged to put baby to breast +8 times/24 hours or sooner if feeding cues are present using NS # 20 PRN She'll pump after feedings/attempts at the breast Breast massage, hand expression and coconut oil were also encouraged  prior pumping Parents will continue supplementing with EBM/formula per feeding choice on admission; will try following supplementation guidelines per baby's age in hours Verify Stork pump issuance   FOB present. All questions and concerns answered, family to contact [facility] services PRN.  Discharge Discharge Education: Engorgement and breast care Pump: Personal (Medela DEBP at home from her last baby, submitted Stork pump request)  Consult Status Consult Status: Follow-up Date: 02/10/23 Follow-up type: In-patient   Marilyn Cooper 02/09/2023, 5:26 PM

## 2023-02-09 NOTE — Progress Notes (Signed)
3 Days Post-Op  Subjective: Doing well this morning.  No NGT output.  + flatus and BM.  Pain has resolved.  ROS: See above, otherwise other systems negative  Objective: Vital signs in last 24 hours: Temp:  [97.5 F (36.4 C)-98.9 F (37.2 C)] 98.1 F (36.7 C) (01/23 0504) Pulse Rate:  [68-92] 88 (01/23 0817) Resp:  [16-17] 16 (01/23 0504) BP: (143-185)/(62-93) 185/93 (01/23 0817) SpO2:  [96 %-99 %] 96 % (01/23 0755)    Intake/Output from previous day: 01/22 0701 - 01/23 0700 In: 1429.2 [P.O.:1020; I.V.:409.2] Out: 2400 [Urine:2200; Emesis/NG output:200] Intake/Output this shift: No intake/output data recorded.  PE: Abd: soft, obese, hernia palpable and nontender, NGT with no output  Lab Results:  Recent Labs    02/07/23 2251 02/08/23 1419  WBC 8.8 10.3  HGB 9.4* 10.2*  HCT 28.1* 30.9*  PLT 236 266   BMET Recent Labs    02/07/23 2251 02/08/23 1419  NA 138 138  K 3.8 3.9  CL 105 107  CO2 20* 21*  GLUCOSE 122* 121*  BUN 13 8  CREATININE 0.80 0.55  CALCIUM 9.6 8.5*   PT/INR No results for input(s): "LABPROT", "INR" in the last 72 hours. CMP     Component Value Date/Time   NA 138 02/08/2023 1419   NA 136 08/23/2022 1543   K 3.9 02/08/2023 1419   CL 107 02/08/2023 1419   CO2 21 (L) 02/08/2023 1419   GLUCOSE 121 (H) 02/08/2023 1419   BUN 8 02/08/2023 1419   BUN 7 08/23/2022 1543   CREATININE 0.55 02/08/2023 1419   CREATININE 0.63 06/15/2016 1516   CALCIUM 8.5 (L) 02/08/2023 1419   PROT 6.2 (L) 02/08/2023 1419   PROT 7.3 08/23/2022 1543   ALBUMIN 2.7 (L) 02/08/2023 1419   ALBUMIN 3.9 08/23/2022 1543   AST 33 02/08/2023 1419   ALT 19 02/08/2023 1419   ALKPHOS 72 02/08/2023 1419   BILITOT 0.3 02/08/2023 1419   BILITOT <0.2 08/23/2022 1543   GFRNONAA >60 02/08/2023 1419   GFRNONAA >89 06/15/2016 1516   GFRAA >60 08/30/2019 0455   GFRAA >89 06/15/2016 1516   Lipase     Component Value Date/Time   LIPASE 27 08/05/2020 1627        Studies/Results: DG Abd Portable 1V Result Date: 02/09/2023 CLINICAL DATA:  [id number].  Nausea. EXAM: PORTABLE ABDOMEN - 1 VIEW COMPARISON:  Portable abdomen film yesterday at 10:02 p.m., CT with IV contrast yesterday at 5:50 p.m. FINDINGS: NGT is in place with tip in the proximal antrum of stomach. Enteric contrast is now seen within mid to lower abdominal small bowel segments and throughout the large intestine to the rectum. Little if any change is noted in dilated left abdominal small bowel measuring up to 3.5 cm. IMPRESSION: 1. Enteric contrast is now seen within mid to lower abdominal small bowel segments and throughout the large intestine to the rectum. 2. Little if any change in dilated left abdominal small bowel measuring up to 3.5 cm. Electronically Signed   By: Almira Bar M.D.   On: 02/09/2023 04:02   DG Abd Portable 1V-Small Bowel Protocol-Position Verification Result Date: 02/08/2023 CLINICAL DATA:  Confirm nasogastric tube placement EXAM: PORTABLE ABDOMEN - 1 VIEW COMPARISON:  CT earlier today FINDINGS: Satisfactory position of the NG tube with tip and side-port in the stomach. Remainder unchanged from same day CT abdomen and pelvis IMPRESSION: Satisfactory position of the NG tube. Electronically Signed   By: Angelique Holm.D.  On: 02/08/2023 22:33   CT ABDOMEN PELVIS W CONTRAST Addendum Date: 02/08/2023 ADDENDUM REPORT: 02/08/2023 19:11 ADDENDUM: Please note there is inflammatory changes and thickening of a loop of bowel in the lower abdomen at or just above the neck of the hernia. Tiny scattered pockets of air in the periphery of the bowel may be intraluminal. However, early pneumatosis is not excluded. Clinical correlation recommended to evaluate for possibility of developing bowel ischemia. These results were discussed by telephone with Dr. Corrin Parker on 02/08/2023 at 7:10 pm who verbally acknowledged these results. Electronically Signed   By: Elgie Collard M.D.   On:  02/08/2023 19:11   Result Date: 02/08/2023 CLINICAL DATA:  Abdominal pain. History of hernia. Status post C-section. EXAM: CT ABDOMEN AND PELVIS WITH CONTRAST TECHNIQUE: Multidetector CT imaging of the abdomen and pelvis was performed using the standard protocol following bolus administration of intravenous contrast. RADIATION DOSE REDUCTION: This exam was performed according to the departmental dose-optimization program which includes automated exposure control, adjustment of the mA and/or kV according to patient size and/or use of iterative reconstruction technique. CONTRAST:  75mL OMNIPAQUE IOHEXOL 350 MG/ML SOLN COMPARISON:  Pelvis dated 08/05/2020. FINDINGS: Lower chest: The visualized lung bases are clear. There is trace right pleural effusion. Small pockets of pneumoperitoneum in the lower abdomen as well as a small ascites, likely postsurgical. Hepatobiliary: Indeterminate faint focus of hypoenhancement in the posterior right lobe of the liver (30/3). The liver is enlarged measuring approximately 21 cm in midclavicular length. No biliary dilatation. The gallbladder is unremarkable. Pancreas: Unremarkable. No pancreatic ductal dilatation or surrounding inflammatory changes. Spleen: Splenomegaly measuring 16 cm in length. Adrenals/Urinary Tract: The adrenal glands unremarkable. The kidneys, visualized ureters, and urinary bladder appear unremarkable. Stomach/Bowel: There is a midline anterior pelvic ventral hernia containing several loops of small bowel. The neck of the hernia defect measures approximately 10 cm in transverse axial diameter. There is postsurgical changes of bowel with anastomotic staple line in the small bowel in the anterior lower abdomen. There is mild thickening of a loop of small bowel in the lower abdomen with associated inflammatory changes. There is mild dilatation of small-bowel loops measuring up to approximately 3 cm. A transition point suspected at the neck of the hernia were  there is abutment of adjacent small bowel loops likely representing adhesions (coronal 98/6 and axial 63/3). The appendix is normal. Vascular/Lymphatic: The abdominal aorta and IVC are unremarkable. No portal venous gas. There is no adenopathy. Reproductive: The uterus is enlarged.  No suspicious adnexal masses. Other: Postsurgical changes of the anterior pelvic wall. No drainable fluid collection/abscess. Musculoskeletal: No acute osseous pathology. Small pockets of air in the posterior paraspinal subcutaneous fat, likely iatrogenic. IMPRESSION: 1. Findings may represent enteritis and associated ileus but concerning for developing small bowel obstruction due to adhesion. Small-bowel series may provide better evaluation. 2. Midline anterior pelvic ventral hernia containing several loops of small bowel. 3. Small pockets of pneumoperitoneum in the lower abdomen as well as a small ascites, likely postsurgical. 4. Hepatosplenomegaly. Electronically Signed: By: Elgie Collard M.D. On: 02/08/2023 18:25    Anti-infectives: Anti-infectives (From admission, onward)    Start     Dose/Rate Route Frequency Ordered Stop   02/06/23 1130  ceFAZolin (ANCEF) IVPB 2g/100 mL premix        2 g 200 mL/hr over 30 Minutes Intravenous On call to O.R. 02/06/23 1036 02/06/23 1225        Assessment/Plan Chronic ventral hernia, ?psbo -passed SBO protocol and pain  resolved -will DC NGT and allow CLD, ADAT to soft -patient would like to discuss elective weight loss surgery possibilities after recovery to assist in weight loss prior to hernia surgery. -will cont to monitor   FEN - CLD, ADAT to soft VTE - lovenox ID - none currently needed  S/p c-section  I reviewed last 24 h vitals and pain scores, last 48 h intake and output, last 24 h labs and trends, last 24 h imaging results, and OBGYN notes  .   LOS: 3 days    Letha Cape , [facility] Surgery 02/09/2023, 8:20 AM Please see Amion for pager  number during day hours 7:00am-4:30pm or 7:00am -11:30am on weekends

## 2023-02-09 NOTE — Discharge Instructions (Addendum)
Continue to check a blood sugar in the morning (fasting) and before a meal or two each day. Let us know if the morning fasting and before numbers are consistently above 110   Continue to check your blood pressures twice a day Call the office for blood pressures that are consistently above 150 for the top number or 100 for the bottom number   Hypertension During Pregnancy Hypertension is also called high blood pressure. High blood pressure means that the force of your blood moving in your body is too strong. It can cause problems for you and your baby. Different types of high blood pressure can happen during pregnancy. The types are: High blood pressure before you got pregnant. This is called chronic hypertension.  This can continue during your pregnancy. Your doctor will want to keep checking your blood pressure. You may need medicine to keep your blood pressure under control while you are pregnant. You will need follow-up visits after you have your baby. High blood pressure that goes up during pregnancy when it was normal before. This is called gestational hypertension. It will usually get better after you have your baby, but your doctor will need to watch your blood pressure to make sure that it is getting better. Very high blood pressure during pregnancy. This is called preeclampsia. Very high blood pressure is an emergency that needs to be checked and treated right away. You may develop very high blood pressure after giving birth. This is called postpartum preeclampsia. This usually occurs within 48 hours after childbirth but may occur up to 6 weeks after giving birth. This is rare. How does this affect me? If you have high blood pressure during pregnancy, you have a higher chance of developing high blood pressure: As you get older. If you get pregnant again. In some cases, high blood pressure during pregnancy can cause: Stroke. Heart attack. Damage to the kidneys, lungs, or  liver. Preeclampsia. Jerky movements you cannot control (convulsions or seizures). Problems with the placenta.  What can I do to lower my risk?  Keep a healthy weight. Eat a healthy diet. Follow what your doctor tells you about treating any medical problems that you had before becoming pregnant. It is very important to go to all of your doctor visits. Your doctor will check your blood pressure and make sure that your pregnancy is progressing as it should. Treatment should start early if a problem is found.  Follow these instructions at home:  Take your blood pressure 1-2 times per day. Call the office if your blood pressure is 155 or higher for the top number or 105 or higher for the bottom number.    Eating and drinking  Drink enough fluid to keep your pee (urine) pale yellow. Avoid caffeine. Lifestyle Do not use any products that contain nicotine or tobacco, such as cigarettes, e-cigarettes, and chewing tobacco. If you need help quitting, ask your doctor. Do not use alcohol or drugs. Avoid stress. Rest and get plenty of sleep. Regular exercise can help. Ask your doctor what kinds of exercise are best for you. General instructions Take over-the-counter and prescription medicines only as told by your doctor. Keep all prenatal and follow-up visits as told by your doctor. This is important. Contact a doctor if: You have symptoms that your doctor told you to watch for, such as: Headaches. Nausea. Vomiting. Belly (abdominal) pain. Dizziness. Light-headedness. Get help right away if: You have: Very bad belly pain that does not get better with treatment. A  very bad headache that does not get better. Vomiting that does not get better. Sudden, fast weight gain. Sudden swelling in your hands, ankles, or face. Blood in your pee. Blurry vision. Double vision. Shortness of breath. Chest pain. Weakness on one side of your body. Trouble talking. Summary High blood pressure is  also called hypertension. High blood pressure means that the force of your blood moving in your body is too strong. High blood pressure can cause problems for you and your baby. Keep all follow-up visits as told by your doctor. This is important. This information is not intended to replace advice given to you by your health care provider. Make sure you discuss any questions you have with your health care provider. Document Released: 02/05/2010 Document Revised: 04/26/2018 Document Reviewed: 01/30/2018 Elsevier Patient Education  2020 [employer].          Bariatric Surgery You have so much to gain by losing weight. You may have already tried every diet and exercise plan imaginable. And, you may have sought advice from your family physician, too.  Sometimes, in spite of such diligent efforts, you may not be able to achieve long-term results by yourself. In cases of severe obesity, bariatric or weight loss surgery is a proven method of achieving long-term weight control.  Our Services Our bariatric surgery programs offer our patients new hope and a long-term weight-loss solution. Since introducing our services in 2003, we have conducted more than 2,500 successful procedures. Our program is designated as an Financial risk analyst and Bariatric Surgery Accreditation and Quality Improvement Program  and is also designated as a Engineer, manufacturing systems by [employer].  Our exceptional weight-loss surgery team specializes in diagnosis, treatment, follow-up care and ongoing support for our patients with severe weight loss challenges.  We currently offer gastric bypass, and sleeve gastrectomy.    Attend our Bariatrics Seminar Choosing to undergo a bariatric procedure is a big decision, and one that should not be taken lightly. You now have two options in how you learn more about weight-loss surgery - in person or online. Our objective is to ensure you have all of the information that you need to  evaluate the advantages and obligations of this life-changing procedure. Please note that you are not alone in this process, and our experienced team is ready to assist and answer all of your questions.  There are several ways to register for a seminar (either on-line or in person):   1) Call [phone] 2) Go on-line to [facility] and register for either type of  seminar.    [email or web address]

## 2023-02-09 NOTE — Progress Notes (Signed)
Daily Postpartum Note  Admission Date: 02/06/2023 Current Date: 02/09/2023 8:44 AM  Marilyn Cooper is a 33 y.o. [id number] POD#3 s/p scheduled repeat low transverse c-section at [redacted]w[redacted]d for DM2 (on insulin), CHTN (on labetalol tid).  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 NG tube out  Subjective:  Patient states she's feeling much better now that the NG tube is out. She states she had a BM (loose) and void at 0300. No s/s of pre-eclampsia.   Objective:    Current Vital Signs 24h Vital Sign Ranges  T 98.1 F (36.7 C) Temp  Avg: 98.1 F (36.7 C)  Min: 97.5 F (36.4 C)  Max: 98.9 F (37.2 C)  BP (!) 148/64 BP  Min: 143/79  Max: 185/93  HR 84 Pulse  Avg: 80.1  Min: 68  Max: 92  RR 16 Resp  Avg: 16.3  Min: 16  Max: 17  SaO2 96 % Room Air SpO2  Avg: 97.5 %  Min: 96 %  Max: 99 %       24 Hour I/O Current Shift I/O  Time Ins Outs 01/22 0701 - 01/23 0700 In: 1429.2 [P.O.:1020; I.V.:409.2] Out: 2400 [Urine:2200] 01/23 0701 - 01/23 1900 In: -  Out: 250 [Urine:250]   Patient Vitals for the past 24 hrs:  BP Temp Temp src Pulse Resp SpO2  02/09/23 0835 (!) 148/64 -- -- 84 -- --  02/09/23 0823 (!) 162/73 -- -- 84 -- --   02/09/23 0817 (!) 185/93 -- -- 88 -- --  02/09/23 0755 (!) 169/65 -- -- 68 -- 96 %  02/09/23 0745 (!) 171/76 -- -- 86 -- --  02/09/23 0520 (!) 159/73 -- -- -- -- --  02/09/23 0504 (!) 162/74 98.1 F (36.7 C) Oral 80 16 99 %  02/08/23 2345 (!) 143/79 97.9 F (36.6 C) Oral 86 17 98 %  02/08/23 2011 (!) 149/76 98.9 F (37.2 C) Oral 92 16 98 %  02/08/23 1500 -- -- -- -- 16 --  02/08/23 1400 -- -- -- -- 17 --  02/08/23 1324 (!) 156/76 (!) 97.5 F (36.4 C) Oral 74 -- --  02/08/23 1300 -- -- -- -- 17 --  02/08/23 1218 (!) 143/62 -- -- 77 16 97 %  02/08/23 1150 -- -- -- -- -- 97 %  02/08/23 0952 (!) 159/64 -- -- 68 16 --  02/08/23 0857 (!) 183/85 -- -- 74 16 --   Physical exam: General: Well nourished, well developed female in no acute distress. Abdomen: obese. Large, at least 10cm supra-umbilical hernia present, nttp. Prevena in place and functioning correctly over her low transverse skin incision. Abdomen diffusely nttp, +BS Cardiovascular: S1, S2 normal, no murmur, rub or gallop, regular rate and rhythm Respiratory: CTAB Skin: Warm and dry.   Medications: Current Facility-Administered Medications  Medication Dose Route Frequency Provider Last Rate Last Admin   acetaminophen (TYLENOL) tablet 1,000 mg  1,000 mg Oral Q6H Levie Heritage, DO   1,000 mg at 02/08/23 1610   Benzocaine (HURRCAINE) 20 % mouth spray   Mouth/Throat BID PRN Lysle Rubens, MD       coconut oil  1 Application Topical PRN Levie Heritage, DO       witch hazel-glycerin (TUCKS) pad 1 Application  1 Application Topical PRN Levie Heritage, DO       And   dibucaine (NUPERCAINAL) 1 % rectal ointment 1 Application  1 Application Rectal PRN Levie Heritage, DO       diphenhydrAMINE (BENADRYL) injection 12.5 mg  12.5 mg Intravenous Q4H PRN Lacy Duverney M, DO       Or   diphenhydrAMINE (BENADRYL) capsule 25 mg  25 mg Oral Q4H PRN Lacy Duverney M, DO       diphenhydrAMINE (BENADRYL) capsule 25 mg  25  mg Oral Q6H PRN Stinson, Jacob J, DO       enoxaparin (LOVENOX) injection 70 mg  70 mg Subcutaneous Q24H Stinson, Jacob J, DO   70 mg at 02/08/23 0600   escitalopram (LEXAPRO) tablet 10 mg  10 mg Oral Daily Levie Heritage, DO   10 mg at 02/08/23 9604   labetalol (NORMODYNE) injection 20 mg  20 mg Intravenous PRN Joanne Gavel, MD   20 mg at 02/09/23 5409   And   labetalol (NORMODYNE) injection 40 mg  40 mg Intravenous PRN Joanne Gavel, MD   40 mg at 02/09/23 0825   And   labetalol (NORMODYNE) injection 80 mg  80 mg Intravenous PRN Joanne Gavel, MD       And   hydrALAZINE (APRESOLINE) injection 10 mg  10 mg Intravenous PRN Joanne Gavel, MD       HYDROmorphone (DILAUDID) injection 1-2 mg  1-2 mg Intravenous Q3H PRN Milas Hock, MD   2 mg at 02/08/23 2026   ibuprofen (ADVIL) tablet 600 mg  600 mg Oral Q6H PRN Milas Hock, MD       insulin aspart (novoLOG) injection 0-15 Units  0-15 Units Subcutaneous TID WC Joanne Gavel, MD   2 Units at 02/07/23 2251   labetalol (NORMODYNE) tablet 400 mg  400 mg Oral BID Milas Hock, MD       lactated ringers infusion   Intravenous Continuous Lysle Rubens, MD 125 mL/hr at 02/08/23 2033 New Bag at 02/08/23 2033   lisinopril (ZESTRIL) tablet 20 mg  20 mg Oral Daily Joanne Gavel, MD   20 mg at 02/08/23 0926   loratadine (CLARITIN) tablet 10 mg  10 mg Oral Daily Levie Heritage, DO   10 mg at 02/08/23 8119   menthol-cetylpyridinium (CEPACOL) lozenge 3 mg  1 lozenge Oral Q2H PRN Levie Heritage, DO       metFORMIN (GLUCOPHAGE) tablet 1,000 mg  1,000 mg Oral Q breakfast Levie Heritage, DO   1,000 mg at 02/08/23 1478   metoCLOPramide (REGLAN) injection 5 mg  5 mg Intravenous Q6H PRN Milas Hock, MD   5 mg at 02/08/23 1508   naloxone [facility]) injection 0.4 mg  0.4 mg Intravenous PRN [name], DO       And   sodium chloride flush (NS) 0.9 % injection 3 mL  3 mL Intravenous PRN [name], DO   3  mL at 02/08/23 1610  naloxone HCl (NARCAN) 2 mg in dextrose 5 % 250 mL infusion  1-2 mcg/kg/hr Intravenous Continuous PRN [name], DO       ondansetron [facility]) injection 4 mg  4 mg Intravenous Q8H PRN [name], DO   4 mg at 02/08/23 1319   oxyCODONE (Oxy IR/ROXICODONE) immediate release tablet 5-10 mg  5-10 mg Oral Q4H PRN Levie Heritage, DO   10 mg at 02/08/23 0935   scopolamine (TRANSDERM-SCOP) 1 MG/3DAYS 1.5 mg  1 patch Transdermal Once Lacy Duverney M, DO       senna-docusate (Senokot-S) tablet 2 tablet  2 tablet Oral Daily Levie Heritage, DO   2 tablet at 02/08/23 1610   simethicone (MYLICON) chewable tablet 80 mg  80 mg Oral TID PC Levie Heritage, DO   80 mg at 02/08/23 1207   simethicone (MYLICON) chewable tablet 80 mg  80 mg Oral PRN Levie Heritage, DO       Labs:  Lactic acid neg x 4 (last at 2134) 1/22: Mg 3.7 Recent Labs  Lab 02/07/23 0445 02/07/23 2251 02/08/23 1419  WBC 11.1* 8.8 10.3  HGB 7.7* 9.4* 10.2*  HCT 23.5* 28.1* 30.9*  PLT 222 236 266   Recent Labs  Lab 02/06/23 1107 02/07/23 2251 02/08/23 1419  NA 135 138 138  K 3.7 3.8 3.9  CL 107 105 107  CO2 17* 20* 21*  BUN 5* 13 8  CREATININE 0.52 0.80 0.55  CALCIUM 8.5* 9.6 8.5*  PROT 7.0 6.3* 6.2*  BILITOT <0.2 0.3 0.3  ALKPHOS 88 73 72  ALT 10 16 19   AST 13* 39 33  GLUCOSE 112* 122* 121*    Latest Reference Range & Units 02/07/23 22:35 02/07/23 23:25 02/08/23 08:05 02/08/23 13:18 02/08/23 23:46  Glucose-Capillary 70 - 99 mg/dL 960 (H) 454 (H) 098 (H) 111 (H) 95   Radiology:  Narrative & Impression  CLINICAL DATA:  [id number].  Nausea.   EXAM: PORTABLE ABDOMEN - 1 VIEW   COMPARISON:  Portable abdomen film yesterday at 10:02 p.m., CT with IV contrast yesterday at 5:50 p.m.   FINDINGS: NGT is in place with tip in the proximal antrum of stomach. Enteric contrast is now seen within mid to lower abdominal small bowel segments and throughout the large intestine to  the rectum.   Little if any change is noted in dilated left abdominal small bowel measuring up to 3.5 cm.   IMPRESSION: 1. Enteric contrast is now seen within mid to lower abdominal small bowel segments and throughout the large intestine to the rectum. 2. Little if any change in dilated left abdominal small bowel measuring up to 3.5 cm.     Electronically Signed   By: Almira Bar M.D.   On: 02/09/2023 04:02     Narrative & Impression  CLINICAL DATA:  Confirm nasogastric tube placement   EXAM: PORTABLE ABDOMEN - 1 VIEW   COMPARISON:  CT earlier today   FINDINGS: Satisfactory position of the NG tube with tip and side-port in the stomach. Remainder unchanged from same day CT abdomen and pelvis   IMPRESSION: Satisfactory position of the NG tube.     Electronically Signed   By: Minerva Fester M.D.   On: 02/08/2023 22:33   ADDENDUM REPORT: 02/08/2023 19:11   ADDENDUM: Please note there is inflammatory changes and thickening of a loop of bowel in the lower abdomen at or just above the neck of the hernia. Tiny scattered pockets of air  in the periphery of the bowel may be intraluminal. However, early pneumatosis is not excluded. Clinical correlation recommended to evaluate for possibility of developing bowel ischemia.   These results were discussed by telephone with Dr. Corrin Parker on 02/08/2023 at 7:10 pm who verbally acknowledged these results.     Electronically Signed   By: Elgie Collard M.D.   On: 02/08/2023 19:11    Addended by Elgie Collard, MD on 02/08/2023  7:13 PM     Study Result  Narrative & Impression  CLINICAL DATA:  Abdominal pain. History of hernia. Status post C-section.   EXAM: CT ABDOMEN AND PELVIS WITH CONTRAST   TECHNIQUE: Multidetector CT imaging of the abdomen and pelvis was performed using the standard protocol following bolus administration of intravenous contrast.   RADIATION DOSE REDUCTION: This exam was performed according  to the departmental dose-optimization program which includes automated exposure control, adjustment of the mA and/or kV according to patient size and/or use of iterative reconstruction technique.   CONTRAST:  75mL OMNIPAQUE IOHEXOL 350 MG/ML SOLN   COMPARISON:  Pelvis dated 08/05/2020.   FINDINGS: Lower chest: The visualized lung bases are clear. There is trace right pleural effusion.   Small pockets of pneumoperitoneum in the lower abdomen as well as a small ascites, likely postsurgical.   Hepatobiliary: Indeterminate faint focus of hypoenhancement in the posterior right lobe of the liver (30/3). The liver is enlarged measuring approximately 21 cm in midclavicular length. No biliary dilatation. The gallbladder is unremarkable.   Pancreas: Unremarkable. No pancreatic ductal dilatation or surrounding inflammatory changes.   Spleen: Splenomegaly measuring 16 cm in length.   Adrenals/Urinary Tract: The adrenal glands unremarkable. The kidneys, visualized ureters, and urinary bladder appear unremarkable.   Stomach/Bowel: There is a midline anterior pelvic ventral hernia containing several loops of small bowel. The neck of the hernia defect measures approximately 10 cm in transverse axial diameter.   There is postsurgical changes of bowel with anastomotic staple line in the small bowel in the anterior lower abdomen. There is mild thickening of a loop of small bowel in the lower abdomen with associated inflammatory changes. There is mild dilatation of small-bowel loops measuring up to approximately 3 cm. A transition point suspected at the neck of the hernia were there is abutment of adjacent small bowel loops likely representing adhesions (coronal 98/6 and axial 63/3). The appendix is normal.   Vascular/Lymphatic: The abdominal aorta and IVC are unremarkable. No portal venous gas. There is no adenopathy.   Reproductive: The uterus is enlarged.  No suspicious adnexal masses.    Other: Postsurgical changes of the anterior pelvic wall. No drainable fluid collection/abscess.   Musculoskeletal: No acute osseous pathology. Small pockets of air in the posterior paraspinal subcutaneous fat, likely iatrogenic.   IMPRESSION: 1. Findings may represent enteritis and associated ileus but concerning for developing small bowel obstruction due to adhesion. Small-bowel series may provide better evaluation. 2. Midline anterior pelvic ventral hernia containing several loops of small bowel. 3. Small pockets of pneumoperitoneum in the lower abdomen as well as a small ascites, likely postsurgical. 4. Hepatosplenomegaly.   Electronically Signed: By: Elgie Collard M.D. On: 02/08/2023 18:25    Assessment & Plan:  Patient improving *Postpartum/post op: routine care.Routine labs ordered for today (cbc/cmp) -formula/nexplanon (pt would like inpatient if possible but was told her insurance wouldn't cover inpatient)/rpr neg/girl baby/A+ (anti big E, & Duffy small a).  *FEN/GI: Gen Surg following and appreciate recommendations. Advanced to clears and if does okay can  ADAT to soft *CHTN: BPs elevated due to not getting PO meds yesterday. BP in room improved and to start scheduled PO meds later this morning. Continue to follow, but no s/s of pre-eclampsia.  *DM2: CBGs okay, continue achs checks, continue metformin.  *Anemia: no current s/s. -s/p 1U prbc on 1/21 and venofer 500 on 1/21 *Anx/dep: no issues *PPx: continue lovenox, OOB ad lib *Dispo: potentially tomorrow; has 1/30 RN post op visit on 1/30.   Cornelia Copa MD Attending Center for [facility] GYN Consult Phone: [phone] (M-F, [phone]) & [phone]  (Off hours, weekends, holidays)

## 2023-02-10 ENCOUNTER — Ambulatory Visit: Payer: BC Managed Care – PPO

## 2023-02-10 ENCOUNTER — Other Ambulatory Visit (HOSPITAL_COMMUNITY): Payer: Self-pay

## 2023-02-10 ENCOUNTER — Inpatient Hospital Stay (HOSPITAL_COMMUNITY): Payer: BC Managed Care – PPO

## 2023-02-10 DIAGNOSIS — M7989 Other specified soft tissue disorders: Secondary | ICD-10-CM | POA: Diagnosis not present

## 2023-02-10 LAB — CBC
HCT: 26.2 % — ABNORMAL LOW (ref 36.0–46.0)
Hemoglobin: 8.5 g/dL — ABNORMAL LOW (ref 12.0–15.0)
MCH: 26.3 pg (ref 26.0–34.0)
MCHC: 32.4 g/dL (ref 30.0–36.0)
MCV: 81.1 fL (ref 80.0–100.0)
Platelets: 209 10*3/uL (ref 150–400)
RBC: 3.23 MIL/uL — ABNORMAL LOW (ref 3.87–5.11)
RDW: 14.9 % (ref 11.5–15.5)
WBC: 7.9 10*3/uL (ref 4.0–10.5)
nRBC: 0 % (ref 0.0–0.2)

## 2023-02-10 LAB — GLUCOSE, CAPILLARY
Glucose-Capillary: 105 mg/dL — ABNORMAL HIGH (ref 70–99)
Glucose-Capillary: 78 mg/dL (ref 70–99)

## 2023-02-10 LAB — COMPREHENSIVE METABOLIC PANEL
ALT: 16 U/L (ref 0–44)
AST: 18 U/L (ref 15–41)
Albumin: 2.3 g/dL — ABNORMAL LOW (ref 3.5–5.0)
Alkaline Phosphatase: 56 U/L (ref 38–126)
Anion gap: 7 (ref 5–15)
BUN: 10 mg/dL (ref 6–20)
CO2: 23 mmol/L (ref 22–32)
Calcium: 8.3 mg/dL — ABNORMAL LOW (ref 8.9–10.3)
Chloride: 109 mmol/L (ref 98–111)
Creatinine, Ser: 0.53 mg/dL (ref 0.44–1.00)
GFR, Estimated: >60 mL/min (ref >60)
Glucose, Bld: 123 mg/dL — ABNORMAL HIGH (ref 70–99)
Potassium: 3.5 mmol/L (ref 3.5–5.1)
Sodium: 139 mmol/L (ref 135–145)
Total Bilirubin: 0.5 mg/dL (ref 0.0–1.2)
Total Protein: 5.4 g/dL — ABNORMAL LOW (ref 6.5–8.1)

## 2023-02-10 MED ORDER — LABETALOL HCL 200 MG PO TABS
400.0000 mg | ORAL_TABLET | Freq: Three times a day (TID) | ORAL | 1 refills | Status: DC
  Filled 2023-02-10: qty 90, 15d supply, fill #0

## 2023-02-10 MED ORDER — ENOXAPARIN SODIUM 120 MG/0.8ML IJ SOSY
120.0000 mg | PREFILLED_SYRINGE | Freq: Two times a day (BID) | INTRAMUSCULAR | 0 refills | Status: DC
  Filled 2023-02-10: qty 24, 15d supply, fill #0

## 2023-02-10 MED ORDER — IBUPROFEN 600 MG PO TABS
600.0000 mg | ORAL_TABLET | Freq: Four times a day (QID) | ORAL | 0 refills | Status: AC | PRN
  Filled 2023-02-10: qty 30, 8d supply, fill #0

## 2023-02-10 MED ORDER — SENNOSIDES-DOCUSATE SODIUM 8.6-50 MG PO TABS
2.0000 | ORAL_TABLET | Freq: Every evening | ORAL | 0 refills | Status: DC | PRN
  Filled 2023-02-10: qty 14, 7d supply, fill #0

## 2023-02-10 MED ORDER — ENOXAPARIN SODIUM 60 MG/0.6ML IJ SOSY
60.0000 mg | PREFILLED_SYRINGE | Freq: Once | INTRAMUSCULAR | Status: AC
  Administered 2023-02-10: 60 mg via SUBCUTANEOUS
  Filled 2023-02-10: qty 0.6

## 2023-02-10 MED ORDER — LISINOPRIL 20 MG PO TABS
20.0000 mg | ORAL_TABLET | Freq: Every day | ORAL | 1 refills | Status: DC
  Filled 2023-02-10: qty 30, 30d supply, fill #0

## 2023-02-10 MED ORDER — SIMETHICONE 80 MG PO CHEW
80.0000 mg | CHEWABLE_TABLET | ORAL | 0 refills | Status: AC | PRN
  Filled 2023-02-10: qty 30, 30d supply, fill #0

## 2023-02-10 MED ORDER — FERROUS SULFATE 325 (65 FE) MG PO TABS: 325.0000 mg | ORAL_TABLET | ORAL | Status: DC

## 2023-02-10 MED ORDER — LABETALOL HCL 200 MG PO TABS
400.0000 mg | ORAL_TABLET | Freq: Three times a day (TID) | ORAL | Status: DC
  Administered 2023-02-10: 400 mg via ORAL
  Filled 2023-02-10: qty 2

## 2023-02-10 MED ORDER — POLYETHYLENE GLYCOL 3350 17 GM/SCOOP PO POWD
17.0000 g | Freq: Every day | ORAL | 1 refills | Status: AC
  Filled 2023-02-10: qty 238, 14d supply, fill #0

## 2023-02-10 MED ORDER — ENOXAPARIN SODIUM 150 MG/ML IJ SOSY
1.0000 mg/kg | PREFILLED_SYRINGE | Freq: Two times a day (BID) | INTRAMUSCULAR | Status: DC
  Filled 2023-02-10: qty 0.9

## 2023-02-10 MED ORDER — OXYCODONE-ACETAMINOPHEN 5-325 MG PO TABS
1.0000 | ORAL_TABLET | Freq: Four times a day (QID) | ORAL | 0 refills | Status: DC | PRN
  Filled 2023-02-10: qty 30, 5d supply, fill #0

## 2023-02-10 NOTE — Discharge Summary (Signed)
Postpartum Discharge Summary  Date of Service updated***     Patient Name: Marilyn Cooper DOB: 1990-12-09 MRN: [id number]  Date of admission: 02/06/2023 Delivery date:02/06/2023 Delivering provider: Levie Heritage Date of discharge: 02/10/2023  Admitting diagnosis: Pregnancy [Z34.90] Status post cesarean delivery [Z98.891] Intrauterine pregnancy: [redacted]w[redacted]d     Secondary diagnosis:  Active Problems:   Chronic hypertension affecting pregnancy   Supervision of high risk pregnancy, antepartum   Type 2 diabetes mellitus (HCC)   Status post cesarean delivery   History of resection of small bowel with strangulation (2016)  Additional problems: ***    Discharge diagnosis: {DX.:23714}                                              Post partum procedures:{Postpartum procedures:23558} Augmentation: {Augmentation:20782} Complications: {OB Labor/Delivery Complications:20784}  Hospital course: {Courses:23701}  Magnesium Sulfate received: {Mag received:30440022} BMZ received: {BMZ received:30440023} Rhophylac:{Rhophylac received:30440032} NWG:{NFA:21308657} T-DaP:{Tdap:23962} Flu: {QIO:96295} RSV Vaccine received: {RSV:31013} Transfusion:{Transfusion received:30440034} Immunizations administered: Immunization History  Administered Date(s) Administered   Tdap 03/11/2012, 12/05/2014, 08/19/2019, 02/16/2021, 01/19/2023   Unspecified SARS-COV-2 Vaccination 10/09/2019    Physical exam  Vitals:   02/10/23 0901 02/10/23 0921 02/10/23 1227 02/10/23 1521  BP: (!) 182/74 (!) 152/59 139/67 137/62  Pulse: 85 91 95 88  Resp:  19 18 17   Temp:   97.7 F (36.5 C) 98 F (36.7 C)  TempSrc:   Oral Oral  SpO2:   98% 98%  Weight:      Height:       General: {Exam; general:21111117} Lochia: {Desc; appropriate/inappropriate:30686::"appropriate"} Uterine Fundus: {Desc; firm/soft:30687} Incision: {Exam; incision:21111123} DVT Evaluation: {Exam; dvt:2111122} Labs: Lab Results  Component  Value Date   WBC 7.9 02/10/2023   HGB 8.5 (L) 02/10/2023   HCT 26.2 (L) 02/10/2023   MCV 81.1 02/10/2023   PLT 209 02/10/2023      Latest Ref Rng & Units 02/10/2023    4:28 AM  CMP  Glucose 70 - 99 mg/dL 284   BUN 6 - 20 mg/dL 10   Creatinine 1.32 - 1.00 mg/dL 4.40   Sodium 102 - 725 mmol/L 139   Potassium 3.5 - 5.1 mmol/L 3.5   Chloride 98 - 111 mmol/L 109   CO2 22 - 32 mmol/L 23   Calcium 8.9 - 10.3 mg/dL 8.3   Total Protein 6.5 - 8.1 g/dL 5.4   Total Bilirubin 0.0 - 1.2 mg/dL 0.5   Alkaline Phos 38 - 126 U/L 56   AST 15 - 41 U/L 18   ALT 0 - 44 U/L 16    Edinburgh Score:    02/08/2023    2:13 PM  Edinburgh Postnatal Depression Scale Screening Tool  I have been able to laugh and see the funny side of things. 0  I have looked forward with enjoyment to things. 0  I have blamed myself unnecessarily when things went wrong. 2  I have been anxious or worried for no good reason. 1  I have felt scared or panicky for no good reason. 0  Things have been getting on top of me. 2  I have been so unhappy that I have had difficulty sleeping. 0  I have felt sad or miserable. 1  I have been so unhappy that I have been crying. 1  The thought of harming myself has occurred to me. 0  Edinburgh Postnatal Depression Scale Total 7      After visit meds:  Allergies as of 02/10/2023       Reactions   Procardia [nifedipine] Itching, Rash   Intolerance, pt reports itching and facial/neck redness        Medication List     STOP taking these medications    aspirin EC 81 MG tablet   insulin NPH Human 100 UNIT/ML injection Commonly known as: NovoLIN N   INSULIN SYRINGE 1CC/29G 29G X 1/2" 1 ML Misc       TAKE these medications    Accu-Chek Guide test strip Generic drug: glucose blood Four times daily   Accu-Chek Guide w/Device Kit 1 kit by Does not apply route daily.   Accu-Chek Softclix Lancets lancets 100 each by Other route 4 (four) times daily.   cetirizine 10 MG  tablet Commonly known as: ZYRTEC Take 10 mg by mouth daily as needed for allergies.   enoxaparin 120 MG/0.8ML injection Commonly known as: LOVENOX Inject 0.8 mLs (120 mg total) into the skin every 12 (twelve) hours.   escitalopram 10 MG tablet Commonly known as: Lexapro Take 1 tablet (10 mg total) by mouth daily.   ferrous sulfate 325 (65 FE) MG tablet Take 1 tablet (325 mg total) by mouth every other day for 42 doses. What changed: when to take this   ibuprofen 600 MG tablet Commonly known as: ADVIL Take 1 tablet (600 mg total) by mouth every 6 (six) hours as needed for mild pain (pain score 1-3).   Labetalol HCl 400 MG Tabs Take 400 mg by mouth every 8 (eight) hours. What changed:  medication strength how much to take when to take this   lisinopril 20 MG tablet Commonly known as: ZESTRIL Take 1 tablet (20 mg total) by mouth daily. Start taking on: February 11, 2023   metFORMIN 500 MG tablet Commonly known as: GLUCOPHAGE Take 2 tablets (1,000 mg total) by mouth daily with breakfast.   oxyCODONE-acetaminophen 5-325 MG tablet Commonly known as: PERCOCET/ROXICET Take 1-2 tablets by mouth every 6 (six) hours as needed.   polyethylene glycol 17 g packet Commonly known as: MiraLax Take 17 g by mouth daily.   PRENATAL VITAMIN PO Take 1 tablet by mouth daily.   senna-docusate 8.6-50 MG tablet Commonly known as: Senokot-S Take 2 tablets by mouth at bedtime as needed for moderate constipation.   simethicone 80 MG chewable tablet Commonly known as: MYLICON Chew 1 tablet (80 mg total) by mouth as needed for flatulence.         Discharge home in stable condition Infant Feeding: {Baby feeding:23562} Infant Disposition:{CHL IP OB HOME WITH ZOXWRU:04540} Discharge instruction: per After Visit Summary and Postpartum booklet. Activity: Advance as tolerated. Pelvic rest for 6 weeks.  Diet: {OB diet:21111121} Anticipated Birth Control: {Birth Control:23956} Postpartum  Appointment:{Outpatient follow up:23559} Additional Postpartum F/U: {PP Procedure:23957} Future Appointments: Future Appointments  Date Time Provider Department Center  02/16/2023 10:35 AM [facility] NURSE [facility] None  02/17/2023  3:30 PM Cedric Fishman, Cquadayshia L, LCSWA [facility] None   Follow up Visit:  Follow-up Information     [facility]. Go on 02/16/2023.   Why: blood pressure and incision check appointment Contact information: [address] [place] [address] [phone]                    02/10/2023 [place], MD

## 2023-02-10 NOTE — Lactation Note (Signed)
This note was copied from a baby's chart. Lactation Consultation Note  Patient Name: Marilyn Cooper VHQIO'[id number] Date: 02/10/2023 Age:32 days Reason for consult: Follow-up assessment;Early term 37-38.6wks;Maternal endocrine disorder;Other (Comment) (cHTN)  Visited with family of 51 days old ETI female; Marilyn Cooper is a P5 and reports she's been pumping and her supply continuous to increase, praised her for her efforts. She also voiced that she has taken baby "Marilyn Cooper" to breast using the NS # 20 and that she latched on, no LATCH score documented overnight though; parents have been mainly working on pumping and bottle feeding during hospital stay. She got her Spectra S2 Stork pump today, discussed about getting inserts for her pump, she voiced she had some from her last baby. Couplet is going home today. Reviewed discharge education and the importance of pumping whenever baby is getting a bottle to protect her supply. Encouraged +8 feedings/24 hours and pumping afterwards. Parents politely declined a referral to [facility] OP, but they have their contact info in case they need to reach out. FOB present. All questions and concerns answered, family to contact [facility] services PRN.  Feeding Mother's Current Feeding Choice: Breast Milk and Formula  Lactation Tools Discussed/Used Tools: Pump;Coconut oil;Flanges;Nipple Shields Nipple shield size: 20 Flange Size: 18 Breast pump type: Double-Electric Breast Pump Pump Education: Setup, frequency, and cleaning;Milk Storage Reason for Pumping: mother's choice, supplementation to breastfeeding Pumping frequency: q 3 hours (recommended) Pumped volume: 60 mL (60-75 ml)  Interventions Interventions: Breast feeding basics reviewed;DEBP;Education;Coconut oil  Discharge Discharge Education: Engorgement and breast care;Warning signs for feeding baby;Outpatient recommendation Pump: Personal;Stork Pump (Medela DEBP from her last baby and [employer])  Consult Status Consult  Status: Complete Date: 02/10/23 Follow-up type: Call as needed   Marilyn Cooper Venetia Constable 02/10/2023, 3:06 PM

## 2023-02-10 NOTE — Progress Notes (Addendum)
Daily Postpartum Note  Admission Date: 02/06/2023 Current Date: 02/10/2023 8:23 AM  Arwilda L Fairbairn is a 33 y.o. [id number] POD#4 s/p scheduled repeat low transverse c-section at [redacted]w[redacted]d for DM2 (on insulin), CHTN (on labetalol tid).  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 +BMs and flatus since yesterday, pain control. No s/s of pre-eclampsia.   Objective:    Current Vital Signs 24h Vital Sign Ranges  T 98.2 F (36.8 C) Temp  Avg: 98.1 F (36.7 C)  Min: 97.6 F (36.4 C)  Max: 98.5 F (36.9 C)  BP (!) 146/69 BP  Min: 141/66  Max: 159/61  HR 84 Pulse  Avg: 82.7  Min: 78  Max: 86  RR 16 Resp  Avg: 16.8  Min: 16  Max: 18  SaO2 98 % Room Air SpO2  Avg: 98.3 %  Min: 98 %  Max: 99 %       24 Hour I/O Current Shift I/O  Time Ins Outs 01/23 0701 - 01/24 0700 In: 4140.8 [P.O.:2740; I.V.:1400.8] Out: 925 [Urine:925] No intake/output data recorded.   Patient Vitals for the past 24 hrs:  BP Temp Temp src Pulse Resp SpO2  02/10/23 0310 (!) 146/69 98.2 F (36.8 C) Oral 84 16 --  02/09/23 2314 (!) 147/63 98.5 F (36.9 C) Oral 84 16 --  02/09/23 2033 (!) 159/61 98.3 F (36.8 C)  Oral 86 17 98 %  02/09/23 1632 (!) 141/66 97.8 F (36.6 C) Oral 78 18 99 %  02/09/23 1031 (!) 147/67 97.6 F (36.4 C) Oral 80 17 98 %  02/09/23 0835 (!) 148/64 -- -- 84 -- --   Physical exam: General: Well nourished, well developed female in no acute distress. Ext: above right wrist, induration and mild erythema, minimally ttp Abdomen: obese. Large, at least 10cm supra-umbilical hernia present, nttp. Prevena in place and functioning correctly over her low transverse skin incision. Abdomen diffusely nttp, +BS Cardiovascular: S1, S2 normal, no murmur, rub or gallop, regular rate and rhythm Respiratory: CTAB Skin: Warm and dry.   Medications: Current Facility-Administered Medications  Medication Dose Route Frequency Provider Last Rate Last Admin   acetaminophen (TYLENOL) tablet 1,000 mg  1,000 mg Oral Q6H Levie Heritage, DO   1,000 mg at 02/10/23 4540   Benzocaine (HURRCAINE) 20 % mouth spray   Mouth/Throat BID PRN Lysle Rubens, MD       coconut oil  1 Application Topical PRN Levie Heritage, DO       witch hazel-glycerin (TUCKS) pad 1 Application  1 Application Topical PRN Levie Heritage, DO       And   dibucaine (NUPERCAINAL) 1 % rectal ointment 1 Application  1 Application Rectal PRN Levie Heritage, DO  diphenhydrAMINE (BENADRYL) injection 12.5 mg  12.5 mg Intravenous Q4H PRN Lacy Duverney M, DO       Or   diphenhydrAMINE (BENADRYL) capsule 25 mg  25 mg Oral Q4H PRN Lacy Duverney M, DO       diphenhydrAMINE (BENADRYL) capsule 25 mg  25 mg Oral Q6H PRN Levie Heritage, DO       enoxaparin (LOVENOX) injection 70 mg  70 mg Subcutaneous Q24H Levie Heritage, DO   70 mg at 02/10/23 0617   escitalopram (LEXAPRO) tablet 10 mg  10 mg Oral Daily Levie Heritage, DO   10 mg at 02/09/23 1032   labetalol (NORMODYNE) injection 20 mg  20 mg Intravenous PRN Joanne Gavel, MD   20 mg at 02/09/23 5784   And   labetalol (NORMODYNE) injection 40 mg  40 mg Intravenous  PRN Joanne Gavel, MD   40 mg at 02/09/23 0825   And   labetalol (NORMODYNE) injection 80 mg  80 mg Intravenous PRN Joanne Gavel, MD       And   hydrALAZINE (APRESOLINE) injection 10 mg  10 mg Intravenous PRN Joanne Gavel, MD       HYDROmorphone (DILAUDID) injection 1-2 mg  1-2 mg Intravenous Q3H PRN Milas Hock, MD   2 mg at 02/08/23 2026   ibuprofen (ADVIL) tablet 600 mg  600 mg Oral Q6H PRN Milas Hock, MD       insulin aspart (novoLOG) injection 0-15 Units  0-15 Units Subcutaneous TID WC Joanne Gavel, MD   2 Units at 02/07/23 2251   labetalol (NORMODYNE) tablet 400 mg  400 mg Oral BID Milas Hock, MD   400 mg at 02/09/23 2029   lisinopril (ZESTRIL) tablet 20 mg  20 mg Oral Daily Joanne Gavel, MD   20 mg at 02/09/23 1031   loratadine (CLARITIN) tablet 10 mg  10 mg Oral Daily Levie Heritage, DO   10 mg at 02/09/23 1032   menthol-cetylpyridinium (CEPACOL) lozenge 3 mg  1 lozenge Oral Q2H PRN Levie Heritage, DO       metFORMIN (GLUCOPHAGE) tablet 1,000 mg  1,000 mg Oral Q breakfast Levie Heritage, DO   1,000 mg at 02/08/23 6962   metoCLOPramide (REGLAN) injection 5 mg  5 mg Intravenous Q6H PRN Milas Hock, MD   5 mg at 02/08/23 1508   naloxone [facility]) injection 0.4 mg  0.4 mg Intravenous PRN [name], DO       And   sodium chloride flush (NS) 0.9 % injection 3 mL  3 mL Intravenous PRN [name], DO   3 mL at 02/08/23 1610   naloxone HCl (NARCAN) 2 mg in dextrose 5 % 250 mL infusion  1-2 mcg/kg/hr Intravenous Continuous PRN [name], DO       ondansetron [facility]) injection 4 mg  4 mg Intravenous Q8H PRN [name], DO   4 mg at 02/08/23 1319   oxyCODONE (Oxy IR/ROXICODONE) immediate release tablet 5-10 mg  5-10 mg Oral Q4H PRN Levie Heritage, DO   10 mg at 02/08/23 0935   scopolamine (TRANSDERM-SCOP) 1 MG/3DAYS 1.5 mg  1 patch Transdermal Once Finucane, Elizabeth M, DO       senna-docusate (Senokot-S)  tablet 2 tablet  2 tablet Oral Daily Levie Heritage, DO   2 tablet at 02/09/23 1031   simethicone (MYLICON) chewable tablet 80 mg  80 mg Oral TID PC Candelaria Celeste  J, DO   80 mg at 02/09/23 2029   simethicone (MYLICON) chewable tablet 80 mg  80 mg Oral PRN Levie Heritage, DO       Labs:  Recent Labs  Lab 02/08/23 1419 02/09/23 0936 02/10/23 0428  WBC 10.3 9.1 7.9  HGB 10.2* 9.8* 8.5*  HCT 30.9* 30.1* 26.2*  PLT 266 245 209   Recent Labs  Lab 02/08/23 1419 02/09/23 0936 02/10/23 0428  NA 138 139 139  K 3.9 3.7 3.5  CL 107 108 109  CO2 21* 22 23  BUN 8 8 10   CREATININE 0.55 0.59 0.53  CALCIUM 8.5* 8.2* 8.3*  PROT 6.2* 5.8* 5.4*  BILITOT 0.3 0.3 0.5  ALKPHOS 72 65 56  ALT 19 18 16   AST 33 24 18  GLUCOSE 121* 169* 123*    Latest Reference Range & Units 02/08/23 13:18 02/08/23 23:46 02/09/23 13:01 02/09/23 17:48 02/09/23 23:14  Glucose-Capillary 70 - 99 mg/dL 914 (H) 95 782 (H) 82 956 (H)   Radiology: no new imaging  Assessment & Plan:  Patient improving *Postpartum/post op: routine care. -formula/nexplanon (outpatient since not covered by insurance)/rpr neg/girl baby/A+ (anti big E, & Duffy small a).  *Heme: pt had similar episode one week postpartum/postop in April 2023 and was treated with bid treatment dose lovenox. Pt breast feeding. F/u RUE dopplers and pt w/o any cardiac or pulmonary s/s. Pharmacy consulted to help with dosing.  *FEN/GI: doing well. Continue soft diet. Saline lock IV *CHTN: elevated BPs this morning. Will increase labetalol to q8h and f/u in 19m after giving meds now; no s/s of pre-eclampsia.  *DM2: CBGs okay, continue achs checks, continue metformin.  *Anemia: no current s/s. -s/p 1U prbc on 1/21 and venofer 500 on 1/21 *Anx/dep: no issues *PPx: continue lovenox, OOB ad lib *Dispo: potentially late today; has 1/30 RN post op visit on 1/30.   Cornelia Copa MD Attending Center for [facility] (Faculty Practice) GYN Consult  Phone: [phone] (M-F, [phone]) & [phone]  (Off hours, weekends, holidays)

## 2023-02-10 NOTE — Plan of Care (Signed)
Problem: Education: Goal: Knowledge of General Education information will improve Description: Including pain rating scale, medication(s)/side effects and non-pharmacologic comfort measures Outcome: Adequate for Discharge   Problem: Health Behavior/Discharge Planning: Goal: Ability to manage health-related needs will improve Outcome: Adequate for Discharge   Problem: Clinical Measurements: Goal: Ability to maintain clinical measurements within normal limits will improve Outcome: Adequate for Discharge Goal: Will remain free from infection Outcome: Adequate for Discharge Goal: Diagnostic test results will improve Outcome: Adequate for Discharge Goal: Respiratory complications will improve Outcome: Adequate for Discharge Goal: Cardiovascular complication will be avoided Outcome: Adequate for Discharge   Problem: Activity: Goal: Risk for activity intolerance will decrease Outcome: Adequate for Discharge   Problem: Nutrition: Goal: Adequate nutrition will be maintained Outcome: Adequate for Discharge   Problem: Coping: Goal: Level of anxiety will decrease Outcome: Adequate for Discharge   Problem: Elimination: Goal: Will not experience complications related to bowel motility Outcome: Adequate for Discharge Goal: Will not experience complications related to urinary retention Outcome: Adequate for Discharge   Problem: Pain Managment: Goal: General experience of comfort will improve and/or be controlled Outcome: Adequate for Discharge   Problem: Safety: Goal: Ability to remain free from injury will improve Outcome: Adequate for Discharge   Problem: Skin Integrity: Goal: Risk for impaired skin integrity will decrease Outcome: Adequate for Discharge   Problem: Education: Goal: Knowledge of disease or condition will improve Outcome: Adequate for Discharge Goal: Knowledge of the prescribed therapeutic regimen will improve Outcome: Adequate for Discharge   Problem:  Fluid Volume: Goal: Peripheral tissue perfusion will improve Outcome: Adequate for Discharge   Problem: Clinical Measurements: Goal: Complications related to disease process, condition or treatment will be avoided or minimized Outcome: Adequate for Discharge   Problem: Education: Goal: Ability to describe self-care measures that may prevent or decrease complications (Diabetes Survival Skills Education) will improve Outcome: Adequate for Discharge Goal: Individualized Educational Video(s) Outcome: Adequate for Discharge   Problem: Coping: Goal: Ability to adjust to condition or change in health will improve Outcome: Adequate for Discharge   Problem: Fluid Volume: Goal: Ability to maintain a balanced intake and output will improve Outcome: Adequate for Discharge   Problem: Health Behavior/Discharge Planning: Goal: Ability to identify and utilize available resources and services will improve Outcome: Adequate for Discharge Goal: Ability to manage health-related needs will improve Outcome: Adequate for Discharge   Problem: Metabolic: Goal: Ability to maintain appropriate glucose levels will improve Outcome: Adequate for Discharge   Problem: Nutritional: Goal: Maintenance of adequate nutrition will improve Outcome: Adequate for Discharge Goal: Progress toward achieving an optimal weight will improve Outcome: Adequate for Discharge   Problem: Skin Integrity: Goal: Risk for impaired skin integrity will decrease Outcome: Adequate for Discharge   Problem: Tissue Perfusion: Goal: Adequacy of tissue perfusion will improve Outcome: Adequate for Discharge   Problem: Education: Goal: Knowledge of the prescribed therapeutic regimen will improve Outcome: Adequate for Discharge Goal: Understanding of sexual limitations or changes related to disease process or condition will improve Outcome: Adequate for Discharge Goal: Individualized Educational Video(s) Outcome: Adequate for  Discharge   Problem: Self-Concept: Goal: Communication of feelings regarding changes in body function or appearance will improve Outcome: Adequate for Discharge   Problem: Skin Integrity: Goal: Demonstration of wound healing without infection will improve Outcome: Adequate for Discharge   Problem: Education: Goal: Knowledge of condition will improve Outcome: Adequate for Discharge Goal: Individualized Educational Video(s) Outcome: Adequate for Discharge Goal: Individualized Newborn Educational Video(s) Outcome: Adequate for Discharge   Problem:  Activity: Goal: Will verbalize the importance of balancing activity with adequate rest periods Outcome: Adequate for Discharge Goal: Ability to tolerate increased activity will improve Outcome: Adequate for Discharge   Problem: Coping: Goal: Ability to identify and utilize available resources and services will improve Outcome: Adequate for Discharge   Problem: Life Cycle: Goal: Chance of risk for complications during the postpartum period will decrease Outcome: Adequate for Discharge   Problem: Role Relationship: Goal: Ability to demonstrate positive interaction with newborn will improve Outcome: Adequate for Discharge   Problem: Skin Integrity: Goal: Demonstration of wound healing without infection will improve Outcome: Adequate for Discharge

## 2023-02-10 NOTE — Progress Notes (Signed)
   02/10/23 1810  Departure Condition  Departure Condition Good  Mobility at [facility]  Patient/Caregiver Teaching Teach Back Method Used;Discharge instructions reviewed;Prescriptions reviewed;Follow-up care reviewed;Pain management discussed;Medications discussed;Patient/caregiver verbalized understanding  Departure Mode With significant other  Was procedural sedation performed on this patient during this visit? No   Patient alert and oriented x4, VS and pain stable.

## 2023-02-10 NOTE — Progress Notes (Signed)
Patient ID: Marilyn Cooper, female   DOB: 11/27/1990, 33 y.o.   MRN: [id number]   Acute Care Surgery Service Progress Note:    Chief Complaint/Subjective: No c/o No n/v Had a BM Tolerating diet  Objective: Vital signs in last 24 hours: Temp:  [97.8 F (36.6 C)-98.5 F (36.9 C)] 98.2 F (36.8 C) (01/24 0822) Pulse Rate:  [78-91] 91 (01/24 0921) Resp:  [16-19] 19 (01/24 0921) BP: (141-182)/(59-84) 152/59 (01/24 0921) SpO2:  [98 %-99 %] 98 % (01/24 0822)    Intake/Output from previous day: 01/23 0701 - 01/24 0700 In: 4140.8 [P.O.:2740; I.V.:1400.8] Out: 925 [Urine:925] Intake/Output this shift: No intake/output data recorded.  Lungs: nonlabored  Cardiovascular: reg  Abd: severe obesity; chronically incarcerated incisional hernia without guarding/rebound; no skin changes, min to NT  Extremities: no edema, +SCDs  Neuro: alert, nonfocal  Lab Results: CBC  Recent Labs    02/09/23 0936 02/10/23 0428  WBC 9.1 7.9  HGB 9.8* 8.5*  HCT 30.1* 26.2*  PLT 245 209   BMET Recent Labs    02/09/23 0936 02/10/23 0428  NA 139 139  K 3.7 3.5  CL 108 109  CO2 22 23  GLUCOSE 169* 123*  BUN 8 10  CREATININE 0.59 0.53  CALCIUM 8.2* 8.3*   LFT    Latest Ref Rng & Units 02/10/2023    4:28 AM 02/09/2023    9:36 AM 02/08/2023    2:19 PM  Hepatic Function  Total Protein 6.5 - 8.1 g/dL 5.4  5.8  6.2   Albumin 3.5 - 5.0 g/dL 2.3  2.5  2.7   AST 15 - 41 U/L 18  24  33   ALT 0 - 44 U/L 16  18  19    Alk Phosphatase 38 - 126 U/L 56  65  72   Total Bilirubin 0.0 - 1.2 mg/dL 0.5  0.3  0.3    PT/INR No results for input(s): "LABPROT", "INR" in the last 72 hours. ABG No results for input(s): "PHART", "HCO3" in the last 72 hours.  Invalid input(s): "PCO2", "PO2"  Studies/Results:  Anti-infectives: Anti-infectives (From admission, onward)    Start     Dose/Rate Route Frequency Ordered Stop   02/06/23 1130  ceFAZolin (ANCEF) IVPB 2g/100 mL premix        2 g 200 mL/hr  over 30 Minutes Intravenous On call to O.R. 02/06/23 1036 02/06/23 1225       Medications: Scheduled Meds:  acetaminophen  1,000 mg Oral Q6H   enoxaparin (LOVENOX) injection  1 mg/kg Subcutaneous Q12H   escitalopram  10 mg Oral Daily   insulin aspart  0-15 Units Subcutaneous TID WC   labetalol  400 mg Oral Q8H   lisinopril  20 mg Oral Daily   loratadine  10 mg Oral Daily   metFORMIN  1,000 mg Oral Q breakfast   scopolamine  1 patch Transdermal Once   senna-docusate  2 tablet Oral Daily   simethicone  80 mg Oral TID PC   Continuous Infusions:  naloxone HCl (NARCAN) 2 mg in dextrose 5 % 250 mL infusion     PRN Meds:.Benzocaine, coconut oil, witch hazel-glycerin **AND** dibucaine, diphenhydrAMINE **OR** diphenhydrAMINE, diphenhydrAMINE, labetalol **AND** labetalol **AND** labetalol **AND** hydrALAZINE **AND** Measure blood pressure, HYDROmorphone (DILAUDID) injection, [COMPLETED] ketorolac **FOLLOWED BY** ibuprofen, menthol-cetylpyridinium, metoCLOPramide (REGLAN) injection, naloxone **AND** sodium chloride flush, naloxone HCl (NARCAN) 2 mg in dextrose 5 % 250 mL infusion, ondansetron (ZOFRAN) IV, oxyCODONE, simethicone  Assessment/Plan: Patient Active Problem List  Diagnosis Date Noted   History of resection of small bowel with strangulation (2016) 02/09/2023   Status post cesarean delivery 02/06/2023   Anti-D antibodies present during pregnancy 2022-09-30   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 09/30/2022   Type 2 diabetes mellitus (HCC) 08/28/2022   Supervision of high risk pregnancy, antepartum 08/23/2022   History of pre-eclampsia 07/05/2022   Hernia of anterior abdominal wall 07/05/2022   BMI 50.0-59.9, adult (HCC) 12/29/2020   Maternal obesity affecting pregnancy, antepartum 12/01/2020   Chronic hypertension affecting pregnancy 08/20/2019   Anxiety and depression 02/04/2013   s/p Procedure(s): CESAREAN SECTION APPLICATION OF CELL SAVER  02/06/2023  Chronic ventral incarcerated incisional hernia, ?psbo -passed SBO protocol and pain resolved - soft diet -patient would like to discuss elective weight loss surgery possibilities after recovery to assist in weight loss prior to hernia surgery. -will cont to monitor    FEN - soft diet  VTE - lovenox ID - none currently needed   S/p c-section   I reviewed last 24 h vitals and pain scores, last 48 h intake and output, last 24 h labs and trends, last 24 h imaging results, and OBGYN notes   Disposition:  ok to discharge from a gen surgery standpoint POV re ventral hernia/psbo; bari surgery info on chart. Signing off. Pls call with questions   LOS: 4 days    Mary Sella. Andrey Campanile, MD, FACS General, Bariatric, & Minimally Invasive Surgery [phone] [facility]

## 2023-02-10 NOTE — Progress Notes (Signed)
PHARMACY - ANTICOAGULATION CONSULT NOTE  Pharmacy Consult for Lovenox dosing Indication:  thrombophlebitis with clots post partum in prior pregnancies  Allergies  Allergen Reactions   Procardia [Nifedipine] Itching and Rash    Intolerance, pt reports itching and facial/neck redness    Patient Measurements: Height: 5\' 3"  (160 cm) Weight: 134.3 kg (296 lb) IBW/kg (Calculated) : 52.4 Heparin Dosing Weight: 134.3 kg  Vital Signs: Temp: 98.2 F (36.8 C) (01/24 0822) Temp Source: Oral (01/24 0822) BP: 182/74 (01/24 0901) Pulse Rate: 85 (01/24 0901)  Labs: Recent Labs    02/08/23 1419 02/09/23 0936 02/10/23 0428  HGB 10.2* 9.8* 8.5*  HCT 30.9* 30.1* 26.2*  PLT 266 245 209  CREATININE 0.55 0.59 0.53    Estimated Creatinine Clearance: 137 mL/min (by C-G formula based on SCr of 0.53 mg/dL).   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   Panic disorder 08/03/2015   Superficial thrombophlebitis of left upper extremity 06/02/2021   type 2 diabetes    Vaginal Pap smear, abnormal     Medications:  Scheduled:   acetaminophen  1,000 mg Oral Q6H   enoxaparin (LOVENOX) injection  1 mg/kg Subcutaneous Q12H   enoxaparin (LOVENOX) injection  60 mg Subcutaneous Once   escitalopram  10 mg Oral Daily   insulin aspart  0-15 Units Subcutaneous TID WC   labetalol  400 mg Oral Q8H   lisinopril  20 mg Oral Daily   loratadine  10 mg Oral Daily   metFORMIN  1,000 mg Oral Q breakfast   scopolamine  1 patch Transdermal Once    senna-docusate  2 tablet Oral Daily   simethicone  80 mg Oral TID PC   Infusions:   naloxone HCl (NARCAN) 2 mg in dextrose 5 % 250 mL infusion     PRN: Benzocaine, coconut oil, witch hazel-glycerin **AND** dibucaine, diphenhydrAMINE **OR** diphenhydrAMINE, diphenhydrAMINE, labetalol **AND** labetalol **AND** labetalol **AND** hydrALAZINE **AND** Measure blood pressure, HYDROmorphone (DILAUDID) injection, [COMPLETED] ketorolac **FOLLOWED BY** ibuprofen, menthol-cetylpyridinium, metoCLOPramide (REGLAN) injection, naloxone **AND** sodium chloride flush, naloxone HCl (NARCAN) 2 mg in dextrose 5 % 250 mL infusion, ondansetron (ZOFRAN) IV, oxyCODONE, simethicone  Assessment: Due to previous clots post partum and new phlebitis at previous peripheral site, the patient will be started on treatment dosing of Lovenox.   Goal of Therapy:   Monitor platelets by anticoagulation protocol: Yes   Plan:  Lovenox 135 mg SQ twice daily  Vonzell Schlatter Hye T[place]ick 02/10/2023,9:17 AM

## 2023-02-11 LAB — TYPE AND SCREEN
ABO/RH(D): A POS
Antibody Screen: POSITIVE
Unit division: 0
Unit division: 0

## 2023-02-11 LAB — BPAM RBC
Blood Product Expiration Date: 202502182359
Blood Product Expiration Date: 202502212359
ISSUE DATE / TIME: 202501211753
Unit Type and Rh: 6200
Unit Type and Rh: 6200

## 2023-02-16 ENCOUNTER — Ambulatory Visit: Payer: BC Managed Care – PPO

## 2023-02-16 ENCOUNTER — Inpatient Hospital Stay (HOSPITAL_COMMUNITY)
Admission: AD | Admit: 2023-02-16 | Discharge: 2023-02-16 | Disposition: A | Discharge status: Home / Self Care | Payer: BC Managed Care – PPO | Attending: Obstetrics and Gynecology | Admitting: Obstetrics and Gynecology

## 2023-02-16 ENCOUNTER — Encounter (HOSPITAL_COMMUNITY): Payer: Self-pay | Admitting: Obstetrics and Gynecology

## 2023-02-16 VITALS — BP 170/86 | HR 63 | Ht 61.0 in | Wt 285.2 lb

## 2023-02-16 DIAGNOSIS — Z98891 History of uterine scar from previous surgery: Secondary | ICD-10-CM | POA: Diagnosis not present

## 2023-02-16 DIAGNOSIS — O1415 Severe pre-eclampsia, complicating the puerperium: Secondary | ICD-10-CM

## 2023-02-16 DIAGNOSIS — Z79899 Other long term (current) drug therapy: Secondary | ICD-10-CM | POA: Diagnosis not present

## 2023-02-16 DIAGNOSIS — O10919 Unspecified pre-existing hypertension complicating pregnancy, unspecified trimester: Secondary | ICD-10-CM

## 2023-02-16 DIAGNOSIS — O1003 Pre-existing essential hypertension complicating the puerperium: Secondary | ICD-10-CM | POA: Diagnosis not present

## 2023-02-16 LAB — COMPREHENSIVE METABOLIC PANEL
ALT: 17 U/L (ref 0–44)
AST: 16 U/L (ref 15–41)
Albumin: 2.6 g/dL — ABNORMAL LOW (ref 3.5–5.0)
Alkaline Phosphatase: 72 U/L (ref 38–126)
Anion gap: 10 (ref 5–15)
BUN: 7 mg/dL (ref 6–20)
CO2: 22 mmol/L (ref 22–32)
Calcium: 8.2 mg/dL — ABNORMAL LOW (ref 8.9–10.3)
Chloride: 108 mmol/L (ref 98–111)
Creatinine, Ser: 0.71 mg/dL (ref 0.44–1.00)
GFR, Estimated: >60 mL/min (ref >60)
Glucose, Bld: 95 mg/dL (ref 70–99)
Potassium: 3.7 mmol/L (ref 3.5–5.1)
Sodium: 140 mmol/L (ref 135–145)
Total Bilirubin: 0.5 mg/dL (ref 0.0–1.2)
Total Protein: 6 g/dL — ABNORMAL LOW (ref 6.5–8.1)

## 2023-02-16 LAB — CBC
HCT: 26.9 % — ABNORMAL LOW (ref 36.0–46.0)
Hemoglobin: 8.7 g/dL — ABNORMAL LOW (ref 12.0–15.0)
MCH: 26.4 pg (ref 26.0–34.0)
MCHC: 32.3 g/dL (ref 30.0–36.0)
MCV: 81.5 fL (ref 80.0–100.0)
Platelets: 255 10*3/uL (ref 150–400)
RBC: 3.3 MIL/uL — ABNORMAL LOW (ref 3.87–5.11)
RDW: 14.6 % (ref 11.5–15.5)
WBC: 6.5 10*3/uL (ref 4.0–10.5)
nRBC: 0 % (ref 0.0–0.2)

## 2023-02-16 LAB — URINALYSIS, ROUTINE W REFLEX MICROSCOPIC
Bilirubin Urine: NEGATIVE
Glucose, UA: NEGATIVE mg/dL
Ketones, ur: NEGATIVE mg/dL
Nitrite: NEGATIVE
Protein, ur: 100 mg/dL — AB
RBC / HPF: >50 RBC/hpf (ref 0–5)
Specific Gravity, Urine: 1.015 (ref 1.005–1.030)
pH: 6 (ref 5.0–8.0)

## 2023-02-16 MED ORDER — HYDRALAZINE HCL 25 MG PO TABS: 25.0000 mg | ORAL_TABLET | Freq: Four times a day (QID) | ORAL | 0 refills | Status: DC

## 2023-02-16 MED ORDER — LABETALOL HCL 100 MG PO TABS
400.0000 mg | ORAL_TABLET | Freq: Once | ORAL | Status: AC
  Administered 2023-02-16: 400 mg via ORAL
  Filled 2023-02-16: qty 4

## 2023-02-16 MED ORDER — KETOROLAC TROMETHAMINE 30 MG/ML IJ SOLN
30.0000 mg | Freq: Once | INTRAMUSCULAR | Status: AC
  Administered 2023-02-16: 30 mg via INTRAVENOUS
  Filled 2023-02-16: qty 1

## 2023-02-16 MED ORDER — CYCLOBENZAPRINE HCL 5 MG PO TABS
10.0000 mg | ORAL_TABLET | Freq: Once | ORAL | Status: AC
  Administered 2023-02-16: 10 mg via ORAL
  Filled 2023-02-16: qty 2

## 2023-02-16 MED ORDER — ACETAMINOPHEN 500 MG PO TABS
1000.0000 mg | ORAL_TABLET | Freq: Once | ORAL | Status: AC
  Administered 2023-02-16: 1000 mg via ORAL
  Filled 2023-02-16: qty 2

## 2023-02-16 MED ORDER — LABETALOL HCL 5 MG/ML IV SOLN
INTRAVENOUS | Status: AC
  Filled 2023-02-16: qty 4

## 2023-02-16 MED ORDER — LABETALOL HCL 5 MG/ML IV SOLN
20.0000 mg | INTRAVENOUS | Status: DC | PRN
  Administered 2023-02-16: 20 mg via INTRAVENOUS

## 2023-02-16 MED ORDER — LABETALOL HCL 5 MG/ML IV SOLN
80.0000 mg | INTRAVENOUS | Status: DC | PRN
  Administered 2023-02-16: 80 mg via INTRAVENOUS
  Filled 2023-02-16: qty 16

## 2023-02-16 MED ORDER — LABETALOL HCL 5 MG/ML IV SOLN
40.0000 mg | INTRAVENOUS | Status: DC | PRN
  Administered 2023-02-16: 40 mg via INTRAVENOUS
  Filled 2023-02-16: qty 8

## 2023-02-16 MED ORDER — LISINOPRIL 30 MG PO TABS: 30.0000 mg | ORAL_TABLET | Freq: Every day | ORAL | 0 refills | Status: DC

## 2023-02-16 MED ORDER — LABETALOL HCL 200 MG PO TABS: 600.0000 mg | ORAL_TABLET | Freq: Three times a day (TID) | ORAL | 0 refills | Status: DC

## 2023-02-16 MED ORDER — HYDRALAZINE HCL 20 MG/ML IJ SOLN
10.0000 mg | INTRAMUSCULAR | Status: DC | PRN
  Administered 2023-02-16: 10 mg via INTRAVENOUS
  Filled 2023-02-16: qty 1

## 2023-02-16 NOTE — Discharge Instructions (Signed)
New blood pressure regimen: - Lisinopril 30 mg daily - Hydralazine 25 mg four times per day. Can increase to 50 mg if BP >160/110 - STOP labetalol - BP check Monday or Tuesday at [facility]

## 2023-02-16 NOTE — Progress Notes (Signed)
Subjective:  Marilyn Cooper is a 33 y.o. female here for BP check.   Hypertension ROS: not taking medications regularly as instructed, no medication side effects noted, no TIA's, no chest pain on exertion, no dyspnea on exertion, and no swelling of ankles.    Objective:  LMP 05/22/2022   Appearance alert, well appearing, and in no distress and oriented to person, place, and time. General exam BP noted to be well controlled today in office.    Assessment:   Blood Pressure asymptomatic and poorly controlled.   Plan:  Pt states she has only been taking Labetalol 200mg  TID and Lisinopril 20mg  daily. Pt notified of error and instructed to take Labetalol 400mg  TID and Lisinopril 20mg  daily. Pt advised to start Labetalol 400mg  for her noon dose today and if BP reading remain high, go to [facility] this afternoon for PreE evaluation per Dr. Berton Lan. Pt verbalized understanding and will return to the office for repeat BP check on Monday 02/19/22  .   The wound is cleansed, debrided of foreign material as much as possible, and dressed. Wound vac removed. The patient is alerted to watch for any signs of infection (redness, pus, pain, increased swelling or fever) and call if such occurs. Home wound care instructions are provided. Tetanus vaccination status reviewed: last tetanus booster within 10 years.

## 2023-02-16 NOTE — MAU Provider Note (Signed)
MAU Provider Note  Chief Complaint: Hypertension  SUBJECTIVE HPI: Marilyn Cooper is a 33 y.o. [phone] who is POD#10 from rLTCS w/BTS presents to maternity admissions reporting HA and high BP. Pregnancy c/b cHTN, T2DM, PP preE w/SF. Receives [facility] with [facility].  Patient presents with pounding headache that wraps around the head. Some floaters in her vision. Started this AM. Then presented to PP BP check and found to have severely elevated BP. Took additional dose of labetalol this afternoon per provider instruction. HA persisted, and BP continued to rise, so presented to MAU for further care. Denies RUQ pain, extremity swelling, CP, SOB. Healing well from C/S.  Patient delivered at [redacted]w[redacted]d for cHTN on multiple agents. POD#1 had severe range pressures despite lisinopril and was started on magnesium. She was sent home on regimen of labetalol 400 mg Q8 hours and lisinopril 20 mg every day.  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 Tenny Craw, MD;  Location: [facility] ORS;  Service: Obstetrics;  Laterality: N/A;   CESAREAN SECTION  N/A 12/03/2014   Procedure: CESAREAN SECTION;  Surgeon: Marlow Baars, MD;  Location: [facility] ORS;  Service: Obstetrics;  Laterality: N/A;   CESAREAN SECTION N/A 08/29/2019   Procedure: CESAREAN SECTION;  Surgeon: Waynard Reeds, MD;  Location: [facility];  Service: Obstetrics;  Laterality: N/A;   CESAREAN SECTION N/A 05/04/2021   Procedure: CESAREAN SECTION;  Surgeon: Warden Fillers, MD;  Location: [facility];  Service: Obstetrics;  Laterality: N/A;   CESAREAN SECTION N/A 02/06/2023   Procedure: CESAREAN SECTION;  Surgeon: Levie Heritage, DO;  Location: [facility];  Service: Obstetrics;  Laterality: N/A;   COLPOSCOPY     CYSTOSCOPY N/A 08/29/2019   Procedure: CYSTOSCOPY;  Surgeon: Waynard Reeds, MD;  Location: [facility];  Service: Gynecology;  Laterality: N/A;   LAPAROTOMY N/A 08/29/2019   Procedure: EXPLORATORY LAPAROTOMY;  Surgeon: Waynard Reeds, MD;  Location: [facility];  Service: Gynecology;  Laterality: N/A;   TONSILLECTOMY     UMBILICAL HERNIA REPAIR N/A 06/21/2014   Procedure: EXPLORATORY LAPAROTOMY,  SMALL BOWEL RESECTION, PRIMARY REPAIR INCARCERATED VENTRAL HERNIA;  Surgeon: Darnell Level, MD;  Location: [facility] ORS;  Service: General;  Laterality: N/A;   Social History   Socioeconomic History   Marital status: Married    Spouse name: Not on file   Number of children: Not on file   Years of education: Not on file   Highest education level: Not on file  Occupational History  Not on file  Tobacco Use   Smoking status: Never   Smokeless tobacco: Never  Vaping Use   Vaping status: Never Used  Substance and Sexual Activity   Alcohol use: No   Drug use: No   Sexual activity: Not Currently    Birth control/protection: Condom, Rhythm  Other Topics Concern   Not on file  Social History Narrative   Not on file   Social Drivers of Health   Financial Resource Strain: Not on file  Food Insecurity: No Food Insecurity (02/06/2023)   Hunger Vital Sign    Worried About Running Out of Food in  the Last Year: Never true    Ran Out of Food in the Last Year: Never true  Transportation Needs: No Transportation Needs (02/06/2023)   PRAPARE - Administrator, Civil Service (Medical): No    Lack of Transportation (Non-Medical): No  Physical Activity: Not on file  Stress: Not on file  Social Connections: Not on file  Intimate Partner Violence: Not At Risk (02/06/2023)   Humiliation, Afraid, Rape, and Kick questionnaire    Fear of Current or Ex-Partner: No    Emotionally Abused: No    Physically Abused: No    Sexually Abused: No   No current facility-administered medications on file prior to encounter.   Current Outpatient Medications on File Prior to Encounter  Medication Sig Dispense Refill   cetirizine (ZYRTEC) 10 MG tablet Take 10 mg by mouth daily as needed for allergies.     enoxaparin (LOVENOX) 120 MG/0.8ML injection Inject 0.8 mLs (120 mg total) into the skin every 12 (twelve) hours. 67.2 mL 0   escitalopram (LEXAPRO) 10 MG tablet Take 1 tablet (10 mg total) by mouth daily. 30 tablet 3   ferrous sulfate 325 (65 FE) MG tablet Take 1 tablet (325 mg total) by mouth every other day for 42 doses.     ibuprofen (ADVIL) 600 MG tablet Take 1 tablet (600 mg total) by mouth every 6 (six) hours as needed for mild pain (pain score 1-3). 30 tablet 0   oxyCODONE-acetaminophen (PERCOCET/ROXICET) 5-325 MG tablet Take 1-2 tablets by mouth every 6 (six) hours as needed. 30 tablet 0   Prenatal Vit-Fe Fumarate-FA (PRENATAL VITAMIN PO) Take 1 tablet by mouth daily.     Accu-Chek Softclix Lancets lancets 100 each by Other route 4 (four) times daily. (Patient not taking: Reported on 02/16/2023) 100 each 12   Blood Glucose Monitoring Suppl (ACCU-CHEK GUIDE) w/Device KIT 1 kit by Does not apply route daily. (Patient not taking: Reported on 02/16/2023) 1 kit 0   glucose blood (ACCU-CHEK GUIDE) test strip Four times daily 100 each 12   metFORMIN (GLUCOPHAGE) 500 MG tablet Take 2 tablets (1,000 mg  total) by mouth daily with breakfast. (Patient not taking: Reported on 02/16/2023) 60 tablet 5   polyethylene glycol powder (GLYCOLAX/MIRALAX) 17 GM/SCOOP powder Take 1 capful (17 g) by mouth daily. 238 g 1   senna-docusate (SENOKOT-S) 8.6-50 MG tablet Take 2 tablets by mouth at bedtime as needed for moderate constipation. 14 tablet 0   simethicone (MYLICON) 80 MG chewable tablet Chew 1 tablet (80 mg total) by mouth as needed for flatulence. 30 tablet 0   Allergies  Allergen Reactions   Procardia [Nifedipine] Itching and Rash    Intolerance, pt reports itching and facial/neck redness    ROS:  Pertinent positives/negatives listed above.  I have reviewed patient's Past Medical Hx, Surgical Hx, Family Hx, Social Hx, medications and allergies.  Physical Exam  Patient Vitals for the past 24 hrs:  BP Temp Pulse Resp SpO2 Height Weight  02/16/23 2131 (!) 154/62 -- 69 -- -- -- --  02/16/23 2114 (!) 189/83 -- 69 -- -- -- --  02/16/23 2055 (!) 182/83 -- 75 -- 95 % -- --  02/16/23 2040 (!) 184/81 -- 75 -- 93 % -- --  02/16/23 2016 (!) 189/85 -- 74 -- -- -- --  02/16/23 1950 (!) 187/83 -- -- -- -- -- --  02/16/23 1946 -- 97.9 F (36.6 C) 66 18 97 % 5\' 1"  (1.549 m) 131.1 kg   Constitutional: Well-developed, well-nourished female in no acute distress  Cardiovascular: normal rate Respiratory: normal effort GI: Abd soft, non-tender MS: Extremities nontender, trace edema, normal ROM Neurologic: Alert and oriented x 4  GU: Neg CVAT Neuro: No gross abnormalities. Logical/linear thought process  LAB RESULTS Results for orders placed or performed during the hospital encounter of 02/16/23 (from the past 24 hours)  CBC     Status: Abnormal   Collection Time: 02/16/23  6:57 PM  Result Value Ref Range   WBC 6.5 4.0 - 10.5 K/uL   RBC 3.30 (L) 3.87 - 5.11 MIL/uL   Hemoglobin 8.7 (L) 12.0 - 15.0 g/dL   HCT 60.4 (L) 54.0 - 98.1 %   MCV 81.5 80.0 - 100.0 fL   MCH 26.4 26.0 - 34.0 pg   MCHC 32.3  30.0 - 36.0 g/dL   RDW 19.1 47.8 - 29.5 %   Platelets 255 150 - 400 K/uL   nRBC 0.0 0.0 - 0.2 %  Comprehensive metabolic panel     Status: Abnormal   Collection Time: 02/16/23  6:57 PM  Result Value Ref Range   Sodium 140 135 - 145 mmol/L   Potassium 3.7 3.5 - 5.1 mmol/L   Chloride 108 98 - 111 mmol/L   CO2 22 22 - 32 mmol/L   Glucose, Bld 95 70 - 99 mg/dL   BUN 7 6 - 20 mg/dL   Creatinine, Ser 6.21 0.44 - 1.00 mg/dL   Calcium 8.2 (L) 8.9 - 10.3 mg/dL   Total Protein 6.0 (L) 6.5 - 8.1 g/dL   Albumin 2.6 (L) 3.5 - 5.0 g/dL   AST 16 15 - 41 U/L   ALT 17 0 - 44 U/L   Alkaline Phosphatase 72 38 - 126 U/L   Total Bilirubin 0.5 0.0 - 1.2 mg/dL   GFR, Estimated >30 >86 mL/min   Anion gap 10 5 - 15    --/--/A POS (01/21 1610)  IMAGING   MAU Management/MDM: Orders Placed This Encounter  Procedures   CBC   Comprehensive metabolic panel   Urinalysis, Routine w reflex microscopic -Urine, Clean Catch   Notify physician (specify) Confirmatory reading of BP> 160/110 15 minutes later   Apply Hypertensive Disorders of Pregnancy Care Plan   Measure blood pressure    Meds ordered this encounter  Medications   AND Linked Order Group    labetalol (NORMODYNE) injection 20 mg    labetalol (NORMODYNE) injection 40 mg    labetalol (NORMODYNE) injection 80 mg    hydrALAZINE (APRESOLINE) injection 10 mg   labetalol (NORMODYNE) 5 MG/ML injection    [place], [place]: cabinet override   acetaminophen (TYLENOL) tablet 1,000 mg   ketorolac (TORADOL) 30 MG/ML injection 30 mg   cyclobenzaprine (FLEXERIL) tablet 10 mg   labetalol (NORMODYNE) tablet 400 mg   lisinopril (ZESTRIL) 30 MG tablet    Sig: Take 1  tablet (30 mg total) by mouth daily.    Dispense:  90 tablet    Refill:  0   DISCONTD: labetalol (NORMODYNE) 200 MG tablet    Sig: Take 3 tablets (600 mg total) by mouth 3 (three) times daily.    Dispense:  270 tablet    Refill:  0   hydrALAZINE (APRESOLINE) 25 MG tablet    Sig: Take 1  tablet (25 mg total) by mouth 4 (four) times daily.    Dispense:  120 tablet    Refill:  0     Available prenatal/delivery records reviewed.  Patient seen POD#10 from scheduled rCS for cHTN for elevated BP and HTN. Did also have PP preE w/SF and was on magnesium for 24 hours. She presents with severe range pressures today. She does not have neurologic symptoms to suggest CVA, PRESS, other complication of headache/HTN. Will treat HA with tylenol, toradol, flexeril. Additionally will treat BP with labetalol IV protocol and additional labetalol 400 mg PO. Obtain CBC, CMP.  CBC, CMP grossly wnl expect for stable anemia with Hgb 8.7. Reassuringly normal Cr, LFTs, platelets. Patient much more responsive to hydralazine than labetalol while working through hypertensive protocol.  Discussed patient with attending, Dr. Shawnie Pons. No indication to restart on magnesium at this time. BP and HA are improved and controlled at discharge. Will discharge home with new regimen of lisinopril 30 mg every day and hydralazine 25 mg QID. BP check in office on Monday with repeat BMP. Patient has BP cuff at home.  ASSESSMENT 1. Hypertension in pregnancy, preeclampsia, severe, delivered/postpartum   2. Chronic hypertension affecting pregnancy   3. Status post cesarean delivery     PLAN Discharge home with strict return precautions. Allergies as of 02/16/2023       Reactions   Procardia [nifedipine] Itching, Rash   Intolerance, pt reports itching and facial/neck redness        Medication List     STOP taking these medications    Accu-Chek Guide w/Device Kit   Accu-Chek Softclix Lancets lancets   labetalol 200 MG tablet Commonly known as: NORMODYNE   metFORMIN 500 MG tablet Commonly known as: GLUCOPHAGE       TAKE these medications    Accu-Chek Guide test strip Generic drug: glucose blood Four times daily   cetirizine 10 MG tablet Commonly known as: ZYRTEC Take 10 mg by mouth daily as needed  for allergies.   enoxaparin 120 MG/0.8ML injection Commonly known as: LOVENOX Inject 0.8 mLs (120 mg total) into the skin every 12 (twelve) hours.   escitalopram 10 MG tablet Commonly known as: Lexapro Take 1 tablet (10 mg total) by mouth daily.   ferrous sulfate 325 (65 FE) MG tablet Take 1 tablet (325 mg total) by mouth every other day for 42 doses.   hydrALAZINE 25 MG tablet Commonly known as: APRESOLINE Take 1 tablet (25 mg total) by mouth 4 (four) times daily.   ibuprofen 600 MG tablet Commonly known as: ADVIL Take 1 tablet (600 mg total) by mouth every 6 (six) hours as needed for mild pain (pain score 1-3).   lisinopril 30 MG tablet Commonly known as: ZESTRIL Take 1 tablet (30 mg total) by mouth daily. What changed:  medication strength how much to take   oxyCODONE-acetaminophen 5-325 MG tablet Commonly known as: PERCOCET/ROXICET Take 1-2 tablets by mouth every 6 (six) hours as needed.   polyethylene glycol powder 17 GM/SCOOP powder Commonly known as: GLYCOLAX/MIRALAX Take 1 capful (17 g) by mouth  daily.   PRENATAL VITAMIN PO Take 1 tablet by mouth daily.   Senna-S 8.6-50 MG tablet Generic drug: senna-docusate Take 2 tablets by mouth at bedtime as needed for moderate constipation.   simethicone 80 MG chewable tablet Commonly known as: MYLICON Chew 1 tablet (80 mg total) by mouth as needed for flatulence.         Wylene Simmer, MD OB Fellow 02/16/2023  9:36 PM

## 2023-02-16 NOTE — MAU Note (Addendum)
.  Marilyn Cooper is a 34 y.o. at postpartum here in MAU reporting HTN, floaters, H/A and SOB.Pt had repeat c/s on 02/06/23. Has CHTN. Ws seen in office this am and b/p was elevated. Was told to go home and take her BP meds. Advised symptoms of PreE to report which she has developed this evening. B/P has remained 170s/80s-90s at home.Took Ibuprofen 600mg  at 1630 and did not help h/a.  Has had Labetalol 400 this am, at lunch. And then took Labetalol 200mg  at 1500 and h/a and b/p went down but was back up in an hour   Onset of complaint: this am Pain score: 9 Vitals:   02/16/23 1946  Pulse: 66  Resp: 18  Temp: 97.9 F (36.6 C)  SpO2: 97%     FHT: n/a  Lab orders placed from triage: u/a

## 2023-02-17 ENCOUNTER — Ambulatory Visit: Payer: BC Managed Care – PPO | Admitting: Licensed Clinical Social Worker

## 2023-02-17 DIAGNOSIS — F4323 Adjustment disorder with mixed anxiety and depressed mood: Secondary | ICD-10-CM | POA: Diagnosis not present

## 2023-02-17 DIAGNOSIS — Z1339 Encounter for screening examination for other mental health and behavioral disorders: Secondary | ICD-10-CM | POA: Diagnosis not present

## 2023-02-17 NOTE — BH Specialist Note (Unsigned)
Integrated Behavioral Health via Telemedicine Visit  02/17/2023 Marilyn Cooper [id number]  Number of Integrated Behavioral Health Clinician visits: 3- Third Visit  Session Start time: 1730   Session End time: 1806  Total time in minutes: 36   Referring Provider: *** Patient/Family location: [facility] Provider location: *** All persons participating in visit: *** Types of Service: {CHL AMB TYPE OF [phone]}  I connected with Marilyn Cooper and/or Marilyn Cooper's {family members:20773} via  Telephone or Video Enabled Telemedicine Application  (Video is Caregility application) and verified that I am speaking with the correct person using two identifiers. Discussed confidentiality: {YES/NO:21197}  I discussed the limitations of telemedicine and the availability of in person appointments.  Discussed there is a possibility of technology failure and discussed alternative modes of communication if that failure occurs.  I discussed that engaging in this telemedicine visit, they consent to the provision of behavioral healthcare and the services will be billed under their insurance.  Patient and/or legal guardian expressed understanding and consented to Telemedicine visit: {YES/NO:21197}  Presenting Concerns: Patient and/or family reports the following symptoms/concerns: *** Duration of problem: ***; Severity of problem: {Mild/Moderate/Severe:20260}  Patient and/or Family's Strengths/Protective Factors: {CHL AMB BH PROTECTIVE FACTORS:[phone]}  Goals Addressed: Patient will:  Reduce symptoms of: {IBH Symptoms:21014056}   Increase knowledge and/or ability of: {IBH Patient Tools:21014057}   Demonstrate ability to: {IBH Goals:21014053}  Progress towards Goals: {CHL AMB BH PROGRESS TOWARDS WUJWJ:[id number]}  Interventions: Interventions utilized:  {IBH Interventions:21014054} Standardized Assessments completed: PHQ-SADS    02/17/2023    4:01 PM 08/23/2022    2:57 PM  08/23/2022    2:56 PM  PHQ-SADS Last 3 Score only  PHQ-15 Score 7    Total GAD-7 Score 2 1   PHQ Adolescent Score 5  0    Patient and/or Family Response: Some difficulties with managing her blood pressure this week.. Recently changed medications and now feels like her blood pressure is stable. Feels a lot better today.. but she does feel tired. 3 children under 35 years old.   No dizziness or blurred vision. No headaches today.  She and husband have created a plan for shifts so that they can get rest.  Breastfeeding and pumping.   She's eating well.   Feels like she's in survival mode and mind has not had time to wonder. She has not felt anxious or depressed. No negative emotional concerns. Everything is positive.   Looking forward to spending more time with the 2 yesr old.. Looking forward to [place] Wants them to play together ans having a positive relationship   Assessment: Patient currently experiencing ***.   Patient may benefit from ***.  Plan: Follow up with behavioral health clinician on : *** Behavioral recommendations: *** Referral(s): {IBH Referrals:21014055}  I discussed the assessment and treatment plan with the patient and/or parent/guardian. They were provided an opportunity to ask questions and all were answered. They agreed with the plan and demonstrated an understanding of the instructions.   They were advised to call back or seek an in-person evaluation if the symptoms worsen or if the condition fails to improve as anticipated.  Joon Pohle Cruzita Lederer, LCSWA

## 2023-02-20 ENCOUNTER — Ambulatory Visit: Payer: BC Managed Care – PPO

## 2023-02-23 ENCOUNTER — Encounter (HOSPITAL_COMMUNITY): Payer: Self-pay | Admitting: Obstetrics and Gynecology

## 2023-02-23 ENCOUNTER — Other Ambulatory Visit: Payer: Self-pay

## 2023-02-23 ENCOUNTER — Inpatient Hospital Stay (HOSPITAL_COMMUNITY)
Admission: AD | Admit: 2023-02-23 | Discharge: 2023-02-25 | DRG: 776 | Disposition: A | Discharge status: Home / Self Care | Payer: BC Managed Care – PPO | Attending: Obstetrics and Gynecology | Admitting: Obstetrics and Gynecology

## 2023-02-23 ENCOUNTER — Ambulatory Visit: Payer: BC Managed Care – PPO

## 2023-02-23 VITALS — BP 188/118 | HR 93

## 2023-02-23 DIAGNOSIS — O99215 Obesity complicating the puerperium: Secondary | ICD-10-CM | POA: Diagnosis not present

## 2023-02-23 DIAGNOSIS — O1495 Unspecified pre-eclampsia, complicating the puerperium: Secondary | ICD-10-CM | POA: Diagnosis not present

## 2023-02-23 DIAGNOSIS — O1093 Unspecified pre-existing hypertension complicating the puerperium: Secondary | ICD-10-CM | POA: Diagnosis not present

## 2023-02-23 DIAGNOSIS — O115 Pre-existing hypertension with pre-eclampsia, complicating the puerperium: Secondary | ICD-10-CM | POA: Diagnosis not present

## 2023-02-23 DIAGNOSIS — Z833 Family history of diabetes mellitus: Secondary | ICD-10-CM

## 2023-02-23 DIAGNOSIS — Z79899 Other long term (current) drug therapy: Secondary | ICD-10-CM | POA: Diagnosis not present

## 2023-02-23 DIAGNOSIS — O10919 Unspecified pre-existing hypertension complicating pregnancy, unspecified trimester: Secondary | ICD-10-CM | POA: Diagnosis present

## 2023-02-23 DIAGNOSIS — Z0131 Encounter for examination of blood pressure with abnormal findings: Secondary | ICD-10-CM

## 2023-02-23 DIAGNOSIS — Z98891 History of uterine scar from previous surgery: Secondary | ICD-10-CM

## 2023-02-23 DIAGNOSIS — R519 Headache, unspecified: Secondary | ICD-10-CM | POA: Diagnosis not present

## 2023-02-23 DIAGNOSIS — I10 Essential (primary) hypertension: Secondary | ICD-10-CM

## 2023-02-23 DIAGNOSIS — O2413 Pre-existing diabetes mellitus, type 2, in the puerperium: Secondary | ICD-10-CM | POA: Diagnosis present

## 2023-02-23 DIAGNOSIS — Z8249 Family history of ischemic heart disease and other diseases of the circulatory system: Secondary | ICD-10-CM

## 2023-02-23 DIAGNOSIS — E119 Type 2 diabetes mellitus without complications: Secondary | ICD-10-CM | POA: Diagnosis present

## 2023-02-23 DIAGNOSIS — Z6841 Body Mass Index (BMI) 40.0 and over, adult: Secondary | ICD-10-CM

## 2023-02-23 LAB — CBC
HCT: 35.1 % — ABNORMAL LOW (ref 36.0–46.0)
Hemoglobin: 11 g/dL — ABNORMAL LOW (ref 12.0–15.0)
MCH: 25.6 pg — ABNORMAL LOW (ref 26.0–34.0)
MCHC: 31.3 g/dL (ref 30.0–36.0)
MCV: 81.8 fL (ref 80.0–100.0)
Platelets: 219 10*3/uL (ref 150–400)
RBC: 4.29 MIL/uL (ref 3.87–5.11)
RDW: 15.6 % — ABNORMAL HIGH (ref 11.5–15.5)
WBC: 8.7 10*3/uL (ref 4.0–10.5)
nRBC: 0 % (ref 0.0–0.2)

## 2023-02-23 LAB — COMPREHENSIVE METABOLIC PANEL
ALT: 17 U/L (ref 0–44)
AST: 18 U/L (ref 15–41)
Albumin: 3.4 g/dL — ABNORMAL LOW (ref 3.5–5.0)
Alkaline Phosphatase: 73 U/L (ref 38–126)
Anion gap: 11 (ref 5–15)
BUN: 9 mg/dL (ref 6–20)
CO2: 22 mmol/L (ref 22–32)
Calcium: 8.8 mg/dL — ABNORMAL LOW (ref 8.9–10.3)
Chloride: 106 mmol/L (ref 98–111)
Creatinine, Ser: 0.72 mg/dL (ref 0.44–1.00)
GFR, Estimated: >60 mL/min (ref >60)
Glucose, Bld: 113 mg/dL — ABNORMAL HIGH (ref 70–99)
Potassium: 3.6 mmol/L (ref 3.5–5.1)
Sodium: 139 mmol/L (ref 135–145)
Total Bilirubin: 0.3 mg/dL (ref 0.0–1.2)
Total Protein: 7.1 g/dL (ref 6.5–8.1)

## 2023-02-23 LAB — GLUCOSE, CAPILLARY: Glucose-Capillary: 133 mg/dL — ABNORMAL HIGH (ref 70–99)

## 2023-02-23 MED ORDER — ENOXAPARIN SODIUM 120 MG/0.8ML IJ SOSY
120.0000 mg | PREFILLED_SYRINGE | Freq: Two times a day (BID) | INTRAMUSCULAR | Status: DC
  Administered 2023-02-23 – 2023-02-25 (×4): 120 mg via SUBCUTANEOUS
  Filled 2023-02-23 (×6): qty 0.8

## 2023-02-23 MED ORDER — MAGNESIUM SULFATE BOLUS VIA INFUSION
6.0000 g | Freq: Once | INTRAVENOUS | Status: AC
  Administered 2023-02-23: 6 g via INTRAVENOUS
  Filled 2023-02-23: qty 1000

## 2023-02-23 MED ORDER — ACETAMINOPHEN 325 MG PO TABS
650.0000 mg | ORAL_TABLET | ORAL | Status: DC | PRN
  Administered 2023-02-23: 650 mg via ORAL
  Filled 2023-02-23: qty 2

## 2023-02-23 MED ORDER — MAGNESIUM SULFATE 40 GM/1000ML IV SOLN
2.0000 g/h | INTRAVENOUS | Status: AC
  Administered 2023-02-24: 2 g/h via INTRAVENOUS
  Filled 2023-02-23 (×2): qty 1000

## 2023-02-23 MED ORDER — LABETALOL HCL 5 MG/ML IV SOLN: 20.0000 mg | INTRAVENOUS | Status: DC | PRN

## 2023-02-23 MED ORDER — LABETALOL HCL 200 MG PO TABS
400.0000 mg | ORAL_TABLET | Freq: Once | ORAL | Status: AC
  Administered 2023-02-23: 400 mg via ORAL
  Filled 2023-02-23: qty 2

## 2023-02-23 MED ORDER — LABETALOL HCL 200 MG PO TABS
600.0000 mg | ORAL_TABLET | Freq: Three times a day (TID) | ORAL | Status: DC
  Administered 2023-02-24 – 2023-02-25 (×4): 600 mg via ORAL
  Filled 2023-02-23 (×4): qty 3

## 2023-02-23 MED ORDER — PRENATAL MULTIVITAMIN CH
1.0000 | ORAL_TABLET | Freq: Every day | ORAL | Status: DC
  Administered 2023-02-24 – 2023-02-25 (×2): 1 via ORAL
  Filled 2023-02-23 (×2): qty 1

## 2023-02-23 MED ORDER — HYDRALAZINE HCL 20 MG/ML IJ SOLN
5.0000 mg | Freq: Once | INTRAMUSCULAR | Status: AC
  Administered 2023-02-23: 5 mg via INTRAVENOUS

## 2023-02-23 MED ORDER — NYSTATIN 100000 UNIT/GM EX POWD
Freq: Two times a day (BID) | CUTANEOUS | Status: DC
  Filled 2023-02-23: qty 15

## 2023-02-23 MED ORDER — LABETALOL HCL 5 MG/ML IV SOLN
80.0000 mg | INTRAVENOUS | Status: DC | PRN
  Administered 2023-02-23: 80 mg via INTRAVENOUS
  Filled 2023-02-23: qty 16

## 2023-02-23 MED ORDER — HYDRALAZINE HCL 20 MG/ML IJ SOLN: 10.0000 mg | INTRAMUSCULAR | Status: DC | PRN

## 2023-02-23 MED ORDER — HYDRALAZINE HCL 20 MG/ML IJ SOLN
5.0000 mg | INTRAMUSCULAR | Status: DC | PRN
  Administered 2023-02-23: 5 mg via INTRAVENOUS
  Filled 2023-02-23 (×2): qty 1

## 2023-02-23 MED ORDER — LABETALOL HCL 5 MG/ML IV SOLN: 40.0000 mg | INTRAVENOUS | Status: DC | PRN

## 2023-02-23 MED ORDER — FUROSEMIDE 20 MG PO TABS
20.0000 mg | ORAL_TABLET | Freq: Every day | ORAL | Status: DC
  Administered 2023-02-23 – 2023-02-25 (×3): 20 mg via ORAL
  Filled 2023-02-23 (×4): qty 1

## 2023-02-23 MED ORDER — LABETALOL HCL 5 MG/ML IV SOLN
20.0000 mg | INTRAVENOUS | Status: DC | PRN
  Administered 2023-02-23: 20 mg via INTRAVENOUS

## 2023-02-23 MED ORDER — LABETALOL HCL 100 MG PO TABS: 400.0000 mg | ORAL_TABLET | Freq: Three times a day (TID) | ORAL | Status: DC

## 2023-02-23 MED ORDER — LABETALOL HCL 5 MG/ML IV SOLN
40.0000 mg | INTRAVENOUS | Status: DC | PRN
  Filled 2023-02-23: qty 8

## 2023-02-23 MED ORDER — LACTATED RINGERS IV SOLN: INTRAVENOUS | Status: AC

## 2023-02-23 MED ORDER — LABETALOL HCL 5 MG/ML IV SOLN
INTRAVENOUS | Status: AC
  Administered 2023-02-23: 40 mg via INTRAVENOUS
  Filled 2023-02-23: qty 4

## 2023-02-23 MED ORDER — HYDRALAZINE HCL 20 MG/ML IJ SOLN
10.0000 mg | INTRAMUSCULAR | Status: DC | PRN
  Administered 2023-02-23: 10 mg via INTRAVENOUS
  Filled 2023-02-23: qty 1

## 2023-02-23 MED ORDER — CALCIUM CARBONATE ANTACID 500 MG PO CHEW: 2.0000 | CHEWABLE_TABLET | ORAL | Status: DC | PRN

## 2023-02-23 NOTE — H&P (Signed)
 History  CSN: [id number]   Arrival date and time: 02/23/23 1349    None       Chief Complaint  Patient presents with   Hypertension   Headache    Patient is a 33 y.o. [id number] female with recent rLCTS on 02/06/2023 who presents to MAU with hypertension and headache.    Patient was seen in outpatient clinic on 02/16/23 where she told provider that she was taking labetalol  200 mg TID and lisinopril  20 mg daily. She was counseled that she should be taking labetalol  400 mg TID per hospital discharge instructions. That afternoon, patient took one dose of labetalol  400 mg before presenting to the MAU same day. Patient was discharged on an amended antihypertensive regimen of hydralazine  25 mg QID and lisinopril  30 mg daily and counseled to increase her hydralazine  dose to 50 mg if her BP remained elevated.   Patient started taking hydralazine  25 mg QID and lisinopril  30 mg daily. Due to elevated BP around 180s/80-90s, she increased hydralazine  dose to 50 mg QID. Since then, she has remained on that dose due to persistent elevated BP.   Patient reported that one day since the MAU visit, she had an intolerable HA in the setting of elevated BP and wanted to avoid taking Percocet so she took an extra dose of labetalol  600 mg and then an extra dose of labetalol  400 mg which brought her BP down to 169/80s. She missed her follow-up outpatient appointment on 02/20/23 due to poor sleep in the setting of HA. Patient characterizes HA as constant and feels like an explosion keeps going off. Patient localizes HA as worst in the L temporal region and relatively less painful across her forehead. HA is better with labetalol  and Percocet, no change with Tylenol  or NSAIDs. Endorses floaters/spots in vision when HA pain is score of 9/10 and tolerable nausea. Denies photophobia, phonophobia, vomiting, RUQ pain.   Second most bothersome sx after her HA is increased SOB worse than during pregnancy. She also endorses occasional  palpitations, inability to stay hydrated despite at least 320 oz PO intake with golden/dark urine, and episodes of increased pitting BLE worse than during pregnancy.     OB History       Gravida  9   Para  5   Term  3   Preterm  2   AB  4   Living  5        SAB  1   IAB  3   Ectopic  0   Multiple  0   Live Births  5                 Past Medical History:  Diagnosis Date   Anemia     Anxiety     Asthma      childhood   Binge eating disorder 07/05/2022   Candidiasis of skin 07/05/2022   Chronic hypertension     Depressed mood 04/29/2020   Depression      see social work consult, doing really well right now 'is in a good place 3/29   History of pre-eclampsia 07/05/2022    G1 39w, severe g2 @ w 36w delivery, ASA 81mg    Hx of varicella     Major depressive disorder with single episode, 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 Okey, MD;  Location: [facility];  Service: Obstetrics;  Laterality: N/A;   CESAREAN SECTION N/A 12/03/2014    Procedure: CESAREAN SECTION;  Surgeon: Jolene Gaskins, MD;  Location: [facility];  Service: Obstetrics;  Laterality: N/A;   CESAREAN SECTION N/A 08/29/2019    Procedure: CESAREAN SECTION;  Surgeon: Okey Leader, MD;  Location: [facility];  Service: Obstetrics;  Laterality: N/A;   CESAREAN SECTION N/A 05/04/2021    Procedure: CESAREAN SECTION;  Surgeon: Zina Jerilynn LABOR, MD;  Location: [facility];  Service: Obstetrics;  Laterality: N/A;   CESAREAN SECTION N/A 02/06/2023    Procedure: CESAREAN  SECTION;  Surgeon: Barbra Lang PARAS, DO;  Location: [facility];  Service: Obstetrics;  Laterality: N/A;   COLPOSCOPY       CYSTOSCOPY N/A 08/29/2019    Procedure: CYSTOSCOPY;  Surgeon: Okey Leader, MD;  Location: [facility];  Service: Gynecology;  Laterality: N/A;   LAPAROTOMY N/A 08/29/2019    Procedure: EXPLORATORY LAPAROTOMY;  Surgeon: Okey Leader, MD;  Location: [facility];  Service: Gynecology;  Laterality: N/A;   TONSILLECTOMY       UMBILICAL HERNIA REPAIR N/A 06/21/2014    Procedure: EXPLORATORY LAPAROTOMY,  SMALL BOWEL RESECTION, PRIMARY REPAIR INCARCERATED VENTRAL HERNIA;  Surgeon: Krystal Spinner, MD;  Location: [facility];  Service: General;  Laterality: N/A;             Family History  Problem Relation Age of Onset   Anemia Mother     Hypertension Mother     Vision loss Father     Diabetes Father     Anemia Sister     Birth defects Brother          heart disorder?   ADD / ADHD Brother     Hypertension Maternal Grandmother     Diabetes Maternal Grandmother     Hypertension Maternal Grandfather     Diabetes Maternal Grandfather     Hypertension Paternal Grandmother     Diabetes Paternal Grandmother     Hypertension Paternal Grandfather     Diabetes Paternal Grandfather     Hypertension Maternal Aunt     Stomach cancer Neg Hx     Colon cancer Neg Hx     Esophageal cancer Neg Hx     Pancreatic disease Neg Hx          Social History  Social History        Tobacco Use   Smoking status: Never   Smokeless tobacco: Never  Vaping Use   Vaping status: Never Used  Substance Use Topics   Alcohol use: No   Drug use: No      Allergies:  Allergies       Allergies  Allergen Reactions   Procardia  [Nifedipine ] Itching and Rash      Intolerance, pt reports itching and facial/neck redness             Medications Prior to Admission  Medication Sig Dispense Refill Last Dose/Taking   cetirizine (ZYRTEC) 10 MG tablet Take 10 mg by mouth daily as needed for allergies.      02/22/2023 Evening   enoxaparin  (LOVENOX ) 120 MG/0.8ML injection Inject 0.8 mLs (120  mg total) into the skin every 12 (twelve) hours. 67.2 mL 0 02/23/2023 at 10:00 AM   escitalopram  (LEXAPRO ) 10 MG tablet Take 1 tablet (10 mg total) by mouth daily. 30 tablet 3 02/23/2023 at 10:00 AM   ferrous sulfate  325 (65 FE) MG tablet Take 1 tablet (325 mg total) by mouth every other day for 42 doses.     02/22/2023 Morning   hydrALAZINE  (APRESOLINE ) 25 MG tablet Take 1 tablet (25 mg total) by mouth 4 (four) times daily. (Patient taking differently: Take 50 mg by mouth 4 (four) times daily.) 120 tablet 0 02/23/2023 at 10:00 AM   ibuprofen  (ADVIL ) 600 MG tablet Take 1 tablet (600 mg total) by mouth every 6 (six) hours as needed for mild pain (pain score 1-3). 30 tablet 0 02/22/2023 at  7:00 PM   lisinopril  (ZESTRIL ) 30 MG tablet Take 1 tablet (30 mg total) by mouth daily. 90 tablet 0 02/23/2023 Morning   oxyCODONE -acetaminophen  (PERCOCET/ROXICET) 5-325 MG tablet Take 1-2 tablets by mouth every 6 (six) hours as needed. 30 tablet 0 02/22/2023 at  7:00 PM   Prenatal Vit-Fe Fumarate-FA (PRENATAL VITAMIN PO) Take 1 tablet by mouth daily.     02/22/2023 Evening   senna-docusate (SENOKOT-S) 8.6-50 MG tablet Take 2 tablets by mouth at bedtime as needed for moderate constipation. 14 tablet 0 Past Week   simethicone  (MYLICON) 80 MG chewable tablet Chew 1 tablet (80 mg total) by mouth as needed for flatulence. 30 tablet 0 Past Week   glucose blood (ACCU-CHEK GUIDE) test strip Four times daily 100 each 12     polyethylene glycol powder (GLYCOLAX /MIRALAX ) 17 GM/SCOOP powder Take 1 capful (17 g) by mouth daily. (Patient not taking: Reported on 02/23/2023) 238 g 1 Not Taking        Review of Systems  Respiratory:  Positive for shortness of breath.   Cardiovascular:  Positive for palpitations and leg swelling. Negative for chest pain.  Gastrointestinal:  Positive for nausea. Negative for abdominal pain and vomiting.  Genitourinary:  Positive for  decreased urine volume.  Neurological:  Positive for headaches. Negative for syncope and weakness.    Physical Exam    Blood pressure (!) 168/90, pulse 71, temperature 98 F (36.7 C), temperature source Oral, resp. rate 16, height 5' 3 (1.6 m), weight 125.3 kg, SpO2 98%, currently breastfeeding.   Physical Exam Vitals and nursing note reviewed.  Constitutional:      General: She is not in acute distress.    Appearance: She is not ill-appearing.  Cardiovascular:     Rate and Rhythm: Normal rate and regular rhythm.     Heart sounds: No murmur heard. Pulmonary:     Effort: Pulmonary effort is normal. No respiratory distress.     Breath sounds: Normal breath sounds.  Musculoskeletal:     Right lower leg: No edema.     Left lower leg: No edema.  Skin:    General: Skin is warm and dry.  Neurological:     General: No focal deficit present.     Mental Status: She is alert and oriented to person, place, and time.  Psychiatric:        Mood and Affect: Mood normal.        Behavior: Behavior normal.        Thought Content: Thought content normal.      MAU Course  MDM: high   This patient presents to the ED for concern of      Chief Complaint  Patient presents with   Hypertension   Headache    These complaints involves an extensive number of treatment options, and is a complaint that carries with it a high risk of complications and morbidity.  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  The pertinent results include:   Lab Results Last 24 Hours       Results for orders placed or performed during the hospital encounter of 02/23/23 (from the past 24 hours)  CBC     Status: Abnormal    Collection Time: 02/23/23  2:37 PM  Result Value Ref Range    WBC 8.7 4.0 - 10.5 K/uL    RBC 4.29 3.87 - 5.11 MIL/uL    Hemoglobin 11.0 (L) 12.0 - 15.0 g/dL    HCT 64.8 (L) 63.9 - 46.0 %    MCV 81.8 80.0 - 100.0 fL    MCH 25.6 (L) 26.0 - 34.0 pg    MCHC 31.3 30.0 - 36.0 g/dL    RDW 84.3 (H) 88.4 - 15.5 %    Platelets 219 150 - 400 K/uL    nRBC 0.0 0.0 - 0.2 %  Comprehensive metabolic panel     Status: Abnormal    Collection Time: 02/23/23  2:37 PM  Result Value Ref Range    Sodium 139 135 - 145 mmol/L    Potassium 3.6 3.5 - 5.1 mmol/L    Chloride 106 98 - 111 mmol/L    CO2 22 22 - 32 mmol/L    Glucose, Bld 113 (H) 70 - 99 mg/dL    BUN 9 6 - 20 mg/dL    Creatinine, Ser 9.27 0.44 - 1.00 mg/dL    Calcium  8.8 (L) 8.9 - 10.3 mg/dL    Total Protein 7.1 6.5 - 8.1 g/dL    Albumin  3.4 (L) 3.5 - 5.0 g/dL    AST 18 15 - 41 U/L    ALT 17 0 - 44 U/L    Alkaline Phosphatase 73 38 - 126 U/L    Total Bilirubin 0.3 0.0 - 1.2 mg/dL    GFR, Estimated >39 >39 mL/min    Anion gap 11 5 - 15      Medicines ordered and prescription drug management:             Medications:      Meds ordered this encounter  Medications   DISCONTD: labetalol  (NORMODYNE ) injection 20 mg    DISCONTD: labetalol  (NORMODYNE ) injection 40 mg   DISCONTD: labetalol  (NORMODYNE ) injection 80 mg   DISCONTD: hydrALAZINE  (APRESOLINE ) injection 10 mg   labetalol  (NORMODYNE ) 5  MG/ML injection      Richard, [facility] S: cabinet override   furosemide  (LASIX ) tablet 20 mg   magnesium  bolus via infusion 6 g   magnesium  sulfate 40 grams in SWI 1000 mL OB infusion   lactated ringers  infusion   AND Linked Order Group     hydrALAZINE  (APRESOLINE ) injection 5 mg     hydrALAZINE  (APRESOLINE ) injection 10 mg     labetalol  (NORMODYNE ) injection 20 mg     labetalol  (NORMODYNE ) injection 40 mg   acetaminophen  (TYLENOL ) tablet 650 mg   calcium  carbonate (TUMS - dosed in mg elemental calcium ) chewable tablet 400 mg of elemental calcium    prenatal multivitamin tablet 1 tablet   enoxaparin  (LOVENOX ) injection 120 mg   hydrALAZINE  (APRESOLINE ) injection 5 mg   DISCONTD: labetalol  (NORMODYNE ) tablet 400 mg   labetalol  (NORMODYNE ) tablet 400 mg   labetalol  (NORMODYNE ) tablet 600 mg    Reevaluation of the patient after these medicines showed that the patient has not significantly improved   Dispostion: admit to the hospital to [facility] specialty service   Assessment and Plan  Symptomatic poorly controlled hypertension -s/p labetalol  series with intermittently improved but persistent hypertensive readings -CMP reassuring without evidence of renal or hepatic injury -Discussed case with on-call gynecologist to admit to [facility] specialty care with magnesium  - During hospitalization will work on antihypertensive regimen to optimize treatment. -Labetalol  protocol for hypertension - Lasix  20 mg daily  Bearl Feeling 02/23/2023, 6:20 PM   Attending Attestation  I saw and evaluated the patient, performing the key elements of the service.I  personally performed or re-performed the history, physical exam, and medical decision making activities of this service and have verified that the service and findings are accurately  documented in the student's note. I developed the management plan that is described in the student's note, and I agree with the content, with my edits above.    Steffan Rover, MD Attending Family Medicine Physician, [facility] for [facility], [facility]

## 2023-02-23 NOTE — MAU Note (Signed)
...  Marilyn Cooper is a 33 y.o. at Unknown here in MAU reporting: Two weeks three days post partum via C/S. She reports she was sent over from the office for a BP of 188/118. She reports a current HA and reports she has only had one day in the past week where she has not had a HA. Denies RUQ/epigastric pain. Reports occasional visual floaters and bilateral swelling of her shins. Breastfeeding and formula.   Last med doses: Hydralazine  50 mg 1000 (first dose of the day between 0400-0500) Lisinopril  30 mg 1000 Lovenox  120 every day Oxycodone  last night - relieved HA  Onset of complaint: Today Pain score: 7/10 HA  Vitals:   02/23/23 1403  BP: (!) 181/98  Pulse: 93  Resp: 16  Temp: 98 F (36.7 C)  SpO2: 100%     Lab orders placed from triage: none

## 2023-02-23 NOTE — Progress Notes (Signed)
 Subjective:  Marilyn Cooper is a 33 y.o. female here for BP check   Hypertension ROS: taking medications as instructed, no medication side effects noted, no TIA's, no chest pain on exertion, no dyspnea on exertion, no swelling of ankles, and headache .     Objective:  There were no vitals taken for this visit.  Appearance alert, well appearing, and in no distress.   Assessment:   Blood Pressure today in office poorly controlled, needs further observation, and Per Dr.Ajewole Pt to report to MAU  .    Plan:  Pt to report to MAU due to BP at today's visit  .

## 2023-02-23 NOTE — Progress Notes (Signed)
 PHARMACY - ANTICOAGULATION CONSULT NOTE  Pharmacy Consult for Lovenox  Indication: Superficial clot postpartum  Allergies  Allergen Reactions   Procardia  [Nifedipine ] Itching and Rash    Intolerance, pt reports itching and facial/neck redness    Patient Measurements: Height: 5' 3 (160 cm) Weight: 125.3 kg (276 lb 4.8 oz) IBW/kg (Calculated) : 52.4   Vital Signs: Temp: 98 F (36.7 C) (02/06 1403) Temp Source: Oral (02/06 1403) BP: 168/90 (02/06 1801) Pulse Rate: 71 (02/06 1801)  Labs: Recent Labs    02/23/23 1437  HGB 11.0*  HCT 35.1*  PLT 219  CREATININE 0.72    Estimated Creatinine Clearance: 130.1 mL/min (by C-G formula based on SCr of 0.72 mg/dL).   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 Pt discharged on 02/10/23 on therapeutic lovenox  x 6 weeks for superficial clots at IV site Goal of Therapy:  Heparin  level 0.3-0.7 units/ml Monitor platelets by anticoagulation protocol: Yes   Plan:  Continue home regimen of Lovenox  120mg  sq q12h. Will continue to follow  Clayborne Alfonso MATSU 02/23/2023,6:25 PM

## 2023-02-23 NOTE — H&P (Signed)
 Obstetrics Admission History & Physical  02/23/2023 - 6:13 PM Primary OBGYN: [facility]  Chief Complaint: PP severe pre-eclampsia superimposed on poorly controlled CHTN  History of Present Illness  33 y.o. [id number] with above chief complaint. She is s/p a 1/20 scheduled 37wk repeat c-section and she was discharged on POD#4 after ?ileus and titration of BP meds. Pregnancy complicated by: BMI 50s, very large supraumbilical hernia, CHTN. DM2.  Patient sent from clinic due to severe range BPs. Patient states she took hydral 50 #2 dose of the day before going to clinic; she states that she was told to increase from 25 to 50mg  if her BPs were elevated, which she did, in addition to taking her lisinopril  30mg  qday dose today.  She is endorsing headache, no visual changes.   Review of Systems: as noted in the History of Present Illness.  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   Type 2 diabetes mellitus (HCC) 08/28/2022   Supervision of high risk pregnancy, antepartum 08/23/2022   History of pre-eclampsia 07/05/2022   Hernia of anterior abdominal wall 07/05/2022   BMI 50.0-59.9, adult (HCC) 12/29/2020   Maternal obesity affecting pregnancy, antepartum 12/01/2020   Chronic hypertension affecting pregnancy 08/20/2019   Anxiety and depression 02/04/2013     PMHx:  Past Medical History:  Diagnosis Date   Anemia    Anxiety    Asthma    childhood   Binge eating disorder 07/05/2022   Candidiasis of skin 07/05/2022   Chronic hypertension    Depressed mood 04/29/2020   Depression    see social work consult, doing really well right now 'is in a good place 3/29   History of pre-eclampsia  07/05/2022   G1 39w, severe g2 @ w 36w delivery, ASA 81mg    Hx of varicella    Major depressive disorder with single episode, 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 Okey, MD;  Location: [facility];  Service: Obstetrics;  Laterality: N/A;   CESAREAN SECTION N/A 12/03/2014   Procedure: CESAREAN SECTION;  Surgeon: Jolene Gaskins, MD;  Location: [facility];  Service: Obstetrics;  Laterality: N/A;   CESAREAN SECTION N/A 08/29/2019   Procedure: CESAREAN SECTION;  Surgeon: Okey Leader, MD;  Location: [facility];  Service: Obstetrics;  Laterality: N/A;   CESAREAN SECTION N/A 05/04/2021   Procedure: CESAREAN SECTION;  Surgeon: Zina Jerilynn LABOR, MD;  Location: [facility];  Service: Obstetrics;  Laterality: N/A;   CESAREAN SECTION N/A 02/06/2023   Procedure: CESAREAN SECTION;  Surgeon: Barbra Lang PARAS, DO;  Location: [facility];  Service: Obstetrics;  Laterality: N/A;   COLPOSCOPY     CYSTOSCOPY N/A 08/29/2019   Procedure: CYSTOSCOPY;  Surgeon: Okey Leader, MD;  Location: [facility];  Service: Gynecology;  Laterality: N/A;  LAPAROTOMY N/A 08/29/2019   Procedure: EXPLORATORY LAPAROTOMY;  Surgeon: Okey Leader, MD;  Location: [facility];  Service: Gynecology;  Laterality: N/A;   TONSILLECTOMY     UMBILICAL HERNIA REPAIR N/A 06/21/2014   Procedure: EXPLORATORY LAPAROTOMY,  SMALL BOWEL RESECTION, PRIMARY REPAIR INCARCERATED VENTRAL HERNIA;  Surgeon: Krystal Spinner, MD;  Location: [facility];  Service: General;  Laterality: N/A;   Medications:  Medications Prior to Admission   Medication Sig Dispense Refill Last Dose/Taking   cetirizine (ZYRTEC) 10 MG tablet Take 10 mg by mouth daily as needed for allergies.   02/22/2023 Evening   enoxaparin  (LOVENOX ) 120 MG/0.8ML injection Inject 0.8 mLs (120 mg total) into the skin every 12 (twelve) hours. 67.2 mL 0 02/23/2023 at 10:00 AM   escitalopram  (LEXAPRO ) 10 MG tablet Take 1 tablet (10 mg total) by mouth daily. 30 tablet 3 02/23/2023 at 10:00 AM   ferrous sulfate  325 (65 FE) MG tablet Take 1 tablet (325 mg total) by mouth every other day for 42 doses.   02/22/2023 Morning   hydrALAZINE  (APRESOLINE ) 25 MG tablet Take 1 tablet (25 mg total) by mouth 4 (four) times daily. (Patient taking differently: Take 50 mg by mouth 4 (four) times daily.) 120 tablet 0 02/23/2023 at 10:00 AM   ibuprofen  (ADVIL ) 600 MG tablet Take 1 tablet (600 mg total) by mouth every 6 (six) hours as needed for mild pain (pain score 1-3). 30 tablet 0 02/22/2023 at  7:00 PM   lisinopril  (ZESTRIL ) 30 MG tablet Take 1 tablet (30 mg total) by mouth daily. 90 tablet 0 02/23/2023 Morning   oxyCODONE -acetaminophen  (PERCOCET/ROXICET) 5-325 MG tablet Take 1-2 tablets by mouth every 6 (six) hours as needed. 30 tablet 0 02/22/2023 at  7:00 PM   Prenatal Vit-Fe Fumarate-FA (PRENATAL VITAMIN PO) Take 1 tablet by mouth daily.   02/22/2023 Evening   senna-docusate (SENOKOT-S) 8.6-50 MG tablet Take 2 tablets by mouth at bedtime as needed for moderate constipation. 14 tablet 0 Past Week   simethicone  (MYLICON) 80 MG chewable tablet Chew 1 tablet (80 mg total) by mouth as needed for flatulence. 30 tablet 0 Past Week   glucose blood (ACCU-CHEK GUIDE) test strip Four times daily 100 each 12    polyethylene glycol powder (GLYCOLAX /MIRALAX ) 17 GM/SCOOP powder Take 1 capful (17 g) by mouth daily. (Patient not taking: Reported on 02/23/2023) 238 g 1 Not Taking     Allergies: is allergic to procardia  [nifedipine ]. OBHx:  OB History  Gravida Para Term Preterm AB Living  9 5 3 2 4 5   SAB IAB Ectopic  Multiple Live Births  1 3 0 0 5    # Outcome Date GA Lbr Len/2nd Weight Sex Type Anes PTL Lv  9 Term 02/06/23 [redacted]w[redacted]d  3420 g F CS-LTranv Spinal  LIV  8 Term 05/04/21 [redacted]w[redacted]d  3190 g F CS-LTranv Spinal  LIV  7 Preterm 08/29/19 [redacted]w[redacted]d  2850 g M CS-LTranv Spinal  LIV     Birth Comments: preterm  6 Preterm 12/03/14 [redacted]w[redacted]d  3620 g F CS-LTranv EPI, Spinal  LIV     Birth Comments: Preeclampsia, mother treated with mag after birth  5 Term 03/09/12 [redacted]w[redacted]d   CHRISTELLA CS-LTranv EPI  LIV  4 IAB 2013          3 IAB 2012          2 IAB 2011          1 SAB  FHx:  Family History  Problem Relation Age of Onset   Anemia Mother    Hypertension Mother    Vision loss Father    Diabetes Father    Anemia Sister    Birth defects Brother        heart disorder?   ADD / ADHD Brother    Hypertension Maternal Grandmother    Diabetes Maternal Grandmother    Hypertension Maternal Grandfather    Diabetes Maternal Grandfather    Hypertension Paternal Grandmother    Diabetes Paternal Grandmother    Hypertension Paternal Grandfather    Diabetes Paternal Grandfather    Hypertension Maternal Aunt    Stomach cancer Neg Hx    Colon cancer Neg Hx    Esophageal cancer Neg Hx    Pancreatic disease Neg Hx    Soc Hx:  Social History   Socioeconomic History   Marital status: Married    Spouse name: Not on file   Number of children: Not on file   Years of education: Not on file   Highest education level: Not on file  Occupational History   Not on file  Tobacco Use   Smoking status: Never   Smokeless tobacco: Never  Vaping Use   Vaping status: Never Used  Substance and Sexual Activity   Alcohol use: No   Drug use: No   Sexual activity: Not Currently    Birth control/protection: Condom, Rhythm  Other Topics Concern   Not on file  Social History Narrative   Not on file   Social Drivers of Health   Financial Resource Strain: Not on file  Food Insecurity: No Food Insecurity (02/23/2023)    Hunger Vital Sign    Worried About Running Out of Food in the Last Year: Never true    Ran Out of Food in the Last Year: Never true  Transportation Needs: No Transportation Needs (02/23/2023)   PRAPARE - Administrator, Civil Service (Medical): No    Lack of Transportation (Non-Medical): No  Physical Activity: Not on file  Stress: Not on file  Social Connections: Not on file  Intimate Partner Violence: Not At Risk (02/23/2023)   Humiliation, Afraid, Rape, and Kick questionnaire    Fear of Current or Ex-Partner: No    Emotionally Abused: No    Physically Abused: No    Sexually Abused: No    Objective  Patient Vitals for the past 12 hrs:  BP Temp Temp src Pulse Resp SpO2 Height Weight  02/23/23 1731 (!) 150/87 -- -- 73 -- -- -- --  02/23/23 1716 (!) 159/83 -- -- 73 -- -- -- --  02/23/23 1701 (!) 152/82 -- -- 71 -- -- -- --  02/23/23 1646 (!) 152/80 -- -- 73 -- -- -- --  02/23/23 1631 (!) 150/70 -- -- 72 16 -- -- --  02/23/23 1616 (!) 175/94 -- -- 76 -- -- -- --  02/23/23 1601 (!) 159/78 -- -- 78 16 -- -- --  02/23/23 1546 (!) 157/80 -- -- 78 -- -- -- --  02/23/23 1532 (!) 157/84 -- -- 76 -- -- -- --  02/23/23 1516 (!) 149/83 -- -- 78 18 -- -- --  02/23/23 1501 (!) 160/80 -- -- 85 16 -- -- --  02/23/23 1450 (!) 164/84 -- -- 84 17 -- -- --  02/23/23 1427 (!) 184/100 -- -- 97 16 -- -- --  02/23/23 1425 -- -- -- -- -- 98 % -- --  02/23/23 1403 ROLLEN)  181/98 98 F (36.7 C) Oral 93 16 100 % 5' 3 (1.6 m) 125.3 kg    Current Vital Signs 24h Vital Sign Ranges  T 98 F (36.7 C) Temp  Avg: 98 F (36.7 C)  Min: 98 F (36.7 C)  Max: 98 F (36.7 C)  BP (!) 150/87 BP  Min: 149/83  Max: 188/118  HR 73 Pulse  Avg: 80  Min: 71  Max: 97  RR 16 Resp  Avg: 16.4  Min: 16  Max: 18  SaO2 98 % Room Air SpO2  Avg: 99 %  Min: 98 %  Max: 100 %       24 Hour I/O Current Shift I/O  Time Ins Outs No intake/output data recorded. No intake/output data recorded.   General: Well nourished, well  developed female in no acute distress.  Skin:  Warm and dry.  Cardiovascular: S1, S2 normal, no murmur, rub or gallop, regular rate and rhythm Respiratory:  Clear to auscultation bilateral. Normal respiratory effort Abdomen: obese, nttp, non distended. C/d/I incision with small separation of the skin on the right lateral edge. The incision is underneath her pannus and somewhat moist. No e/o infection.  Neuro/Psych:  Normal mood and affect.   Labs  Recent Labs  Lab 02/16/23 1857 02/23/23 1437  WBC 6.5 8.7  HGB 8.7* 11.0*  HCT 26.9* 35.1*  PLT 255 219    Recent Labs  Lab 02/16/23 1857 02/23/23 1437  NA 140 139  K 3.7 3.6  CL 108 106  CO2 22 22  BUN 7 9  CREATININE 0.71 0.72  CALCIUM  8.2* 8.8*  PROT 6.0* 7.1  BILITOT 0.5 0.3  ALKPHOS 72 73  ALT 17 17  AST 16 18  GLUCOSE 95 113*   Radiology none  Assessment & Plan   33 y.o. [id number] with PP severe pre-eclampsia *PP: routine care *Severe pre-eclampsia superimposed on CHTN: pt received 20/40/80 labetalol  and hydral 5 and 5 and BPs better. Will d/c her lisinopril  and hydral and put her back on labetalol  at a higher dose of 600 q8h. Patient can't tolerate procardia ; may consider amlodipine  as additional agent. F/u HELLP labs in AM and do Mg x 24h.  *DM2: currently on nothing. F/u [facility] CBGs *Analgesia: no current needs *FEN/GI: DM diet *PPx: bid lovenox  *Dispo: likely HD#3-4  Bebe Izell Overcast MD Attending [facility])

## 2023-02-23 NOTE — MAU Provider Note (Signed)
 History    CSN: [id number]  Arrival date and time: 02/23/23 1349   None    Chief Complaint  Patient presents with   Hypertension   Headache   Patient is a 33 y.o. [id number] female with recent rLCTS on 02/06/2023 who presents to MAU with hypertension and headache.   Patient was seen in outpatient clinic on 02/16/23 where she told provider that she was taking labetalol  200 mg TID and lisinopril  20 mg daily. She was counseled that she should be taking labetalol  400 mg TID per hospital discharge instructions. That afternoon, patient took one dose of labetalol  400 mg before presenting to the MAU same day. Patient was discharged on an amended antihypertensive regimen of hydralazine  25 mg QID and lisinopril  30 mg daily and counseled to increase her hydralazine  dose to 50 mg if her BP remained elevated.  Patient started taking hydralazine  25 mg QID and lisinopril  30 mg daily. Due to elevated BP around 180s/80-90s, she increased hydralazine  dose to 50 mg QID. Since then, she has remained on that dose due to persistent elevated BP.  Patient reported that one day since the MAU visit, she had an intolerable HA in the setting of elevated BP and wanted to avoid taking Percocet so she took an extra dose of labetalol  600 mg and then an extra dose of labetalol  400 mg which brought her BP down to 169/80s. She missed her follow-up outpatient appointment on 02/20/23 due to poor sleep in the setting of HA. Patient characterizes HA as constant and feels like an explosion keeps going off. Patient localizes HA as worst in the L temporal region and relatively less painful across her forehead. HA is better with labetalol  and Percocet, no change with Tylenol  or NSAIDs. Endorses floaters/spots in vision when HA pain is score of 9/10 and tolerable nausea. Denies photophobia, phonophobia, vomiting, RUQ pain.  Second most bothersome sx after her HA is increased SOB worse than during pregnancy. She also endorses occasional  palpitations, inability to stay hydrated despite at least 320 oz PO intake with golden/dark urine, and episodes of increased pitting BLE worse than during pregnancy.   OB History     Gravida  9   Para  5   Term  3   Preterm  2   AB  4   Living  5      SAB  1   IAB  3   Ectopic  0   Multiple  0   Live Births  5          Past Medical History:  Diagnosis Date   Anemia    Anxiety    Asthma    childhood   Binge eating disorder 07/05/2022   Candidiasis of skin 07/05/2022   Chronic hypertension    Depressed mood 04/29/2020   Depression    see social work consult, doing really well right now 'is in a good place 3/29   History of pre-eclampsia 07/05/2022   G1 39w, severe g2 @ w 36w delivery, ASA 81mg    Hx of varicella    Major depressive disorder with single episode, 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 Okey, MD;  Location: [facility];  Service: Obstetrics;  Laterality: N/A;   CESAREAN SECTION N/A 12/03/2014   Procedure: CESAREAN SECTION;  Surgeon: Jolene Gaskins, MD;  Location: [facility];  Service: Obstetrics;  Laterality: N/A;   CESAREAN SECTION N/A 08/29/2019   Procedure: CESAREAN SECTION;  Surgeon: Okey Marjorie, MD;  Location: [facility];  Service: Obstetrics;  Laterality: N/A;   CESAREAN SECTION N/A 05/04/2021   Procedure: CESAREAN SECTION;  Surgeon: Zina Jerilynn LABOR, MD;  Location: [facility];  Service: Obstetrics;  Laterality: N/A;   CESAREAN SECTION N/A 02/06/2023   Procedure: CESAREAN SECTION;  Surgeon: Barbra Lang PARAS, DO;  Location:  [facility];  Service: Obstetrics;  Laterality: N/A;   COLPOSCOPY     CYSTOSCOPY N/A 08/29/2019   Procedure: CYSTOSCOPY;  Surgeon: Okey Marjorie, MD;  Location: [facility];  Service: Gynecology;  Laterality: N/A;   LAPAROTOMY N/A 08/29/2019   Procedure: EXPLORATORY LAPAROTOMY;  Surgeon: Okey Marjorie, MD;  Location: [facility];  Service: Gynecology;  Laterality: N/A;   TONSILLECTOMY     UMBILICAL HERNIA REPAIR N/A 06/21/2014   Procedure: EXPLORATORY LAPAROTOMY,  SMALL BOWEL RESECTION, PRIMARY REPAIR INCARCERATED VENTRAL HERNIA;  Surgeon: Krystal Spinner, MD;  Location: [facility];  Service: General;  Laterality: N/A;   Family History  Problem Relation Age of Onset   Anemia Mother    Hypertension Mother    Vision loss Father    Diabetes Father    Anemia Sister    Birth defects Brother        heart disorder?   ADD / ADHD Brother    Hypertension Maternal Grandmother    Diabetes Maternal Grandmother    Hypertension Maternal Grandfather    Diabetes Maternal Grandfather    Hypertension Paternal Grandmother    Diabetes Paternal Grandmother    Hypertension Paternal Grandfather    Diabetes Paternal Grandfather    Hypertension Maternal Aunt    Stomach cancer Neg Hx    Colon cancer Neg Hx    Esophageal cancer Neg Hx    Pancreatic disease Neg Hx    Social History   Tobacco Use   Smoking status: Never   Smokeless tobacco: Never  Vaping Use   Vaping status: Never Used  Substance Use Topics   Alcohol use: No   Drug use: No   Allergies:  Allergies  Allergen Reactions   Procardia  [Nifedipine ] Itching and Rash    Intolerance, pt reports itching and facial/neck redness   Medications Prior to Admission  Medication Sig Dispense Refill Last Dose/Taking   cetirizine (ZYRTEC) 10 MG tablet Take 10 mg by mouth daily as needed for allergies.   02/22/2023 Evening   enoxaparin  (LOVENOX ) 120 MG/0.8ML injection Inject 0.8 mLs (120 mg total) into the skin every 12 (twelve) hours. 67.2 mL 0 02/23/2023 at 10:00 AM    escitalopram  (LEXAPRO ) 10 MG tablet Take 1 tablet (10 mg total) by mouth daily. 30 tablet 3 02/23/2023 at 10:00 AM   ferrous sulfate  325 (65 FE) MG tablet Take 1 tablet (325 mg total) by mouth every other day for 42 doses.   02/22/2023 Morning   hydrALAZINE  (APRESOLINE ) 25 MG tablet Take 1 tablet (25 mg total) by mouth 4 (four) times daily. (Patient taking differently: Take 50 mg by mouth 4 (four) times daily.) 120 tablet 0 02/23/2023 at 10:00 AM   ibuprofen  (ADVIL ) 600 MG tablet Take 1  tablet (600 mg total) by mouth every 6 (six) hours as needed for mild pain (pain score 1-3). 30 tablet 0 02/22/2023 at  7:00 PM   lisinopril  (ZESTRIL ) 30 MG tablet Take 1 tablet (30 mg total) by mouth daily. 90 tablet 0 02/23/2023 Morning   oxyCODONE -acetaminophen  (PERCOCET/ROXICET) 5-325 MG tablet Take 1-2 tablets by mouth every 6 (six) hours as needed. 30 tablet 0 02/22/2023 at  7:00 PM   Prenatal Vit-Fe Fumarate-FA (PRENATAL VITAMIN PO) Take 1 tablet by mouth daily.   02/22/2023 Evening   senna-docusate (SENOKOT-S) 8.6-50 MG tablet Take 2 tablets by mouth at bedtime as needed for moderate constipation. 14 tablet 0 Past Week   simethicone  (MYLICON) 80 MG chewable tablet Chew 1 tablet (80 mg total) by mouth as needed for flatulence. 30 tablet 0 Past Week   glucose blood (ACCU-CHEK GUIDE) test strip Four times daily 100 each 12    polyethylene glycol powder (GLYCOLAX /MIRALAX ) 17 GM/SCOOP powder Take 1 capful (17 g) by mouth daily. (Patient not taking: Reported on 02/23/2023) 238 g 1 Not Taking   Review of Systems  Respiratory:  Positive for shortness of breath.   Cardiovascular:  Positive for palpitations and leg swelling. Negative for chest pain.  Gastrointestinal:  Positive for nausea. Negative for abdominal pain and vomiting.  Genitourinary:  Positive for decreased urine volume.  Neurological:  Positive for headaches. Negative for syncope and weakness.   Physical Exam   Blood pressure (!) 168/90, pulse 71, temperature 98  F (36.7 C), temperature source Oral, resp. rate 16, height 5' 3 (1.6 m), weight 125.3 kg, SpO2 98%, currently breastfeeding.  Physical Exam Vitals and nursing note reviewed.  Constitutional:      General: She is not in acute distress.    Appearance: She is not ill-appearing.  Cardiovascular:     Rate and Rhythm: Normal rate and regular rhythm.     Heart sounds: No murmur heard. Pulmonary:     Effort: Pulmonary effort is normal. No respiratory distress.     Breath sounds: Normal breath sounds.  Musculoskeletal:     Right lower leg: No edema.     Left lower leg: No edema.  Skin:    General: Skin is warm and dry.  Neurological:     General: No focal deficit present.     Mental Status: She is alert and oriented to person, place, and time.  Psychiatric:        Mood and Affect: Mood normal.        Behavior: Behavior normal.        Thought Content: Thought content normal.    MAU Course  MDM: high  This patient presents to the ED for concern of   Chief Complaint  Patient presents with   Hypertension   Headache    These complaints involves an extensive number of treatment options, and is a complaint that carries with it a high risk of complications and morbidity.  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  The pertinent results include:   Results for orders placed or performed during the hospital encounter of 02/23/23 (from the past 24 hours)  CBC     Status: Abnormal   Collection Time: 02/23/23  2:37 PM  Result Value Ref Range   WBC 8.7 4.0 - 10.5 K/uL   RBC 4.29 3.87 - 5.11 MIL/uL   Hemoglobin 11.0 (L) 12.0 - 15.0 g/dL   HCT 64.8 (L) 63.9 - 53.9 %   MCV 81.8 80.0 - 100.0 fL   MCH 25.6 (L) 26.0 - 34.0 pg   MCHC 31.3 30.0 - 36.0 g/dL   RDW 84.3 (H) 88.4 - 84.4 %   Platelets 219 150 - 400 K/uL   nRBC 0.0 0.0 - 0.2 %  Comprehensive metabolic panel     Status: Abnormal   Collection Time: 02/23/23  2:37 PM  Result Value Ref Range   Sodium 139 135 - 145 mmol/L   Potassium 3.6 3.5 - 5.1 mmol/L   Chloride 106 98 - 111 mmol/L   CO2 22 22 - 32 mmol/L   Glucose, Bld 113 (H) 70 - 99 mg/dL   BUN 9 6 - 20 mg/dL   Creatinine, Ser 9.27 0.44 - 1.00 mg/dL   Calcium  8.8 (L) 8.9 - 10.3 mg/dL   Total Protein 7.1 6.5 - 8.1 g/dL   Albumin  3.4 (L) 3.5 - 5.0 g/dL   AST 18 15 - 41 U/L   ALT 17 0 - 44 U/L   Alkaline Phosphatase 73 38 - 126 U/L   Total Bilirubin 0.3 0.0 - 1.2 mg/dL   GFR, Estimated >39 >39 mL/min   Anion gap 11 5 - 15   Medicines ordered and prescription drug management:  Medications:  Meds ordered this encounter  Medications   DISCONTD: labetalol  (NORMODYNE ) injection 20 mg   DISCONTD: labetalol  (NORMODYNE ) injection 40 mg   DISCONTD: labetalol  (NORMODYNE ) injection 80 mg   DISCONTD: hydrALAZINE  (APRESOLINE ) injection 10 mg   labetalol  (NORMODYNE ) 5 MG/ML injection    Richard, [facility] S: cabinet override   furosemide  (LASIX ) tablet 20  mg   magnesium  bolus via infusion 6 g   magnesium  sulfate 40 grams in SWI 1000 mL OB infusion   lactated ringers  infusion   AND Linked Order Group    hydrALAZINE  (APRESOLINE ) injection 5 mg    hydrALAZINE  (APRESOLINE ) injection 10 mg    labetalol  (NORMODYNE ) injection 20 mg    labetalol  (NORMODYNE ) injection 40 mg   acetaminophen  (TYLENOL ) tablet 650 mg   calcium  carbonate (TUMS - dosed in mg elemental calcium ) chewable tablet 400 mg of elemental calcium    prenatal multivitamin tablet 1 tablet   enoxaparin  (LOVENOX ) injection 120 mg   hydrALAZINE  (APRESOLINE ) injection 5 mg   DISCONTD: labetalol  (NORMODYNE ) tablet 400 mg   labetalol  (NORMODYNE ) tablet 400 mg   labetalol  (NORMODYNE ) tablet 600 mg   Reevaluation of the patient after these medicines showed that the patient has not significantly improved  Dispostion: admit to the hospital to [facility] specialty service  Assessment and Plan  Symptomatic poorly controlled hypertension -s/p labetalol  series with intermittently improved but persistent hypertensive readings -  CMP reassuring without evidence of renal or hepatic injury -admit to [facility] specialty service with plan to administer Mg and optimize antihypertensive regimen  Bearl Feeling 02/23/2023, 6:20 PM

## 2023-02-24 DIAGNOSIS — O1495 Unspecified pre-eclampsia, complicating the puerperium: Secondary | ICD-10-CM

## 2023-02-24 LAB — COMPREHENSIVE METABOLIC PANEL
ALT: 15 U/L (ref 0–44)
AST: 14 U/L — ABNORMAL LOW (ref 15–41)
Albumin: 3.1 g/dL — ABNORMAL LOW (ref 3.5–5.0)
Alkaline Phosphatase: 61 U/L (ref 38–126)
Anion gap: 9 (ref 5–15)
BUN: 12 mg/dL (ref 6–20)
CO2: 21 mmol/L — ABNORMAL LOW (ref 22–32)
Calcium: 7.6 mg/dL — ABNORMAL LOW (ref 8.9–10.3)
Chloride: 106 mmol/L (ref 98–111)
Creatinine, Ser: 0.64 mg/dL (ref 0.44–1.00)
GFR, Estimated: >60 mL/min (ref >60)
Glucose, Bld: 136 mg/dL — ABNORMAL HIGH (ref 70–99)
Potassium: 3.5 mmol/L (ref 3.5–5.1)
Sodium: 136 mmol/L (ref 135–145)
Total Bilirubin: 0.4 mg/dL (ref 0.0–1.2)
Total Protein: 6.3 g/dL — ABNORMAL LOW (ref 6.5–8.1)

## 2023-02-24 LAB — CBC
HCT: 29.1 % — ABNORMAL LOW (ref 36.0–46.0)
Hemoglobin: 9.2 g/dL — ABNORMAL LOW (ref 12.0–15.0)
MCH: 25.8 pg — ABNORMAL LOW (ref 26.0–34.0)
MCHC: 31.6 g/dL (ref 30.0–36.0)
MCV: 81.5 fL (ref 80.0–100.0)
Platelets: 218 10*3/uL (ref 150–400)
RBC: 3.57 MIL/uL — ABNORMAL LOW (ref 3.87–5.11)
RDW: 15.7 % — ABNORMAL HIGH (ref 11.5–15.5)
WBC: 9.8 10*3/uL (ref 4.0–10.5)
nRBC: 0.3 % — ABNORMAL HIGH (ref 0.0–0.2)

## 2023-02-24 LAB — GLUCOSE, CAPILLARY: Glucose-Capillary: 124 mg/dL — ABNORMAL HIGH (ref 70–99)

## 2023-02-24 MED ORDER — ESCITALOPRAM OXALATE 10 MG PO TABS
10.0000 mg | ORAL_TABLET | Freq: Every day | ORAL | Status: DC
  Administered 2023-02-24 – 2023-02-25 (×2): 10 mg via ORAL
  Filled 2023-02-24 (×2): qty 1

## 2023-02-24 MED ORDER — AMLODIPINE BESYLATE 5 MG PO TABS
5.0000 mg | ORAL_TABLET | Freq: Every day | ORAL | Status: DC
  Administered 2023-02-24 – 2023-02-25 (×2): 5 mg via ORAL
  Filled 2023-02-24 (×2): qty 1

## 2023-02-24 NOTE — Progress Notes (Signed)
 Subjective: Patient reports feeling well this morning. She reports complete resolution of her headache. She states she slept well. She denies visual changes. She is ambulating and tolerating a regular diet  Objective: Blood pressure (!) 103/50, pulse 75, temperature 97.7 F (36.5 C), temperature source Oral, resp. rate 19, height 5' 3 (1.6 m), weight 125.3 kg, SpO2 97%, currently breastfeeding.  Physical Exam:  General: alert, cooperative, and no distress Lochia: appropriate Uterine Fundus: firm Incision: healing well DVT Evaluation: No evidence of DVT seen on physical exam.  Recent Labs    02/23/23 1437 02/24/23 0404  HGB 11.0* 9.2*  HCT 35.1* 29.1*    Assessment/Plan: 33 yo POD#18 s/p RLTCS with CHTN admitted with preemclampsia - Will continue magnesium  sulfate for seizure prophylaxis - BP stable on labetalol  600 mg QID - Patient reports only needing labetalol  300 BID and procardia  30 XL prior to her pregnancy - Will add Norvasc  for now and see if better results can be obtained with one medication - Continue current care   LOS: 1 day   Winton Felt, MD 02/24/2023, 9:41 AM

## 2023-02-24 NOTE — Inpatient Diabetes Management (Signed)
 Inpatient Diabetes Program Recommendations  AACE/ADA: New Consensus Statement on Inpatient Glycemic Control   Target Ranges:  Prepandial:   less than 140 mg/dL      Peak postprandial:   less than 180 mg/dL (1-2 hours)      Critically ill patients:  140 - 180 mg/dL    Latest Reference Range & Units 02/23/23 23:18 02/24/23 08:07  Glucose-Capillary 70 - 99 mg/dL 866 (H) 875 (H)    Latest Reference Range & Units 02/23/23 14:37 02/24/23 04:04  Glucose 70 - 99 mg/dL 886 (H) 863 (H)   Review of Glycemic Control  Diabetes history: DM2 Outpatient Diabetes medications: None Current orders for Inpatient glycemic control: None  Inpatient Diabetes Program Recommendations:    Insulin : Please consider ordering CBGs AC&HS and Novolog  0-9 units TID with meals and Novolog  0-5 units at bedtime.  Outpatient DM: May consider discharging on Metformin  500 mg BID.  Thanks, Earnie Gainer, RN, MSN, CDCES Diabetes Coordinator Inpatient Diabetes Program [phone] (Team Pager from 8am to 5pm)

## 2023-02-25 ENCOUNTER — Other Ambulatory Visit (HOSPITAL_COMMUNITY): Payer: Self-pay

## 2023-02-25 LAB — RESPIRATORY PANEL BY PCR

## 2023-02-25 LAB — GLUCOSE, CAPILLARY
Glucose-Capillary: 109 mg/dL — ABNORMAL HIGH (ref 70–99)
Glucose-Capillary: 116 mg/dL — ABNORMAL HIGH (ref 70–99)

## 2023-02-25 MED ORDER — NYSTATIN 100000 UNIT/GM EX POWD
Freq: Two times a day (BID) | CUTANEOUS | 11 refills | Status: AC
  Filled 2023-02-25: qty 15, 7d supply, fill #0

## 2023-02-25 MED ORDER — LABETALOL HCL 300 MG PO TABS
600.0000 mg | ORAL_TABLET | Freq: Three times a day (TID) | ORAL | 2 refills | Status: AC
  Filled 2023-02-25: qty 180, 30d supply, fill #0

## 2023-02-25 MED ORDER — FUROSEMIDE 20 MG PO TABS
20.0000 mg | ORAL_TABLET | Freq: Every day | ORAL | 0 refills | Status: DC
Start: 2023-02-26 — End: 2023-03-15
  Filled 2023-02-25: qty 5, 5d supply, fill #0

## 2023-02-25 MED ORDER — AMLODIPINE BESYLATE 5 MG PO TABS: 10.0000 mg | ORAL_TABLET | Freq: Every day | ORAL | Status: DC

## 2023-02-25 MED ORDER — AMLODIPINE BESYLATE 10 MG PO TABS
10.0000 mg | ORAL_TABLET | Freq: Every day | ORAL | 2 refills | Status: DC
  Filled 2023-02-25: qty 30, 30d supply, fill #0

## 2023-02-25 NOTE — Discharge Summary (Signed)
 Physician Discharge Summary  Patient ID: Marilyn Cooper MRN: [id number] DOB/AGE: [birth year] 33 y.o.  Admit date: 02/23/2023 Discharge date: 02/25/2023  Admission Diagnoses: Postpartum pre eclmapsia, SF,: BP cHTN A2DM Morbid obesity  Discharge Diagnoses:  Principal Problem:   Pre-eclampsia in postpartum period Active Problems:   Chronic hypertension affecting pregnancy   BMI 50.0-59.9, adult (HCC)   Type 2 diabetes mellitus (HCC)   Status post cesarean delivery   Discharged Condition: stable  Hospital Course:  Admitted PPD #12 with unacceptable BP levels Admitted for BP management + IV magnesium   Eventually settled on labetalol  600 TID + amlodipine  10 mg daily + lasix  20 every day with good control  Consults:   Significant Diagnostic Studies: labs: were all normal  Treatments: BP control and magnesium   Discharge Exam: Blood pressure (!) 144/74, pulse 83, temperature 98.3 F (36.8 C), temperature source Oral, resp. rate 18, height 5' 3 (1.6 m), weight 125.3 kg, SpO2 100%, currently breastfeeding. General appearance: alert, cooperative, and no distress GI: soft, non-tender; bowel sounds normal; no masses,  no organomegaly  Disposition: Discharge disposition: 01-Home or Self Care       Discharge Instructions     Call MD for:   Complete by: As directed    BP > 150/100   Call MD for:  persistant nausea and vomiting   Complete by: As directed    Call MD for:  severe uncontrolled pain   Complete by: As directed    Call MD for:  temperature >100.4   Complete by: As directed    Diet - low sodium heart healthy   Complete by: As directed       Allergies as of 02/25/2023       Reactions   Procardia  [nifedipine ] Itching, Rash   Intolerance, pt reports itching and facial/neck redness        Medication List     STOP taking these medications    Accu-Chek Guide test strip Generic drug: glucose blood   ferrous sulfate  325 (65 FE) MG tablet   hydrALAZINE   25 MG tablet Commonly known as: APRESOLINE        TAKE these medications    amLODipine  10 MG tablet Commonly known as: NORVASC  Take 1 tablet (10 mg total) by mouth daily. Start taking on: February 26, 2023   cetirizine 10 MG tablet Commonly known as: ZYRTEC Take 10 mg by mouth daily as needed for allergies.   enoxaparin  120 MG/0.8ML injection Commonly known as: LOVENOX  Inject 0.8 mLs (120 mg total) into the skin every 12 (twelve) hours.   escitalopram  10 MG tablet Commonly known as: Lexapro  Take 1 tablet (10 mg total) by mouth daily.   furosemide  20 MG tablet Commonly known as: LASIX  Take 1 tablet (20 mg total) by mouth daily. Start taking on: February 26, 2023   ibuprofen  600 MG tablet Commonly known as: ADVIL  Take 1 tablet (600 mg total) by mouth every 6 (six) hours as needed for mild pain (pain score 1-3).   labetalol  300 MG tablet Commonly known as: NORMODYNE  Take 2 tablets (600 mg total) by mouth every 8 (eight) hours.   lisinopril  30 MG tablet Commonly known as: ZESTRIL  Take 1 tablet (30 mg total) by mouth daily.   nystatin  powder Commonly known as: MYCOSTATIN /NYSTOP  Apply topically 2 (two) times daily.   oxyCODONE -acetaminophen  5-325 MG tablet Commonly known as: PERCOCET/ROXICET Take 1-2 tablets by mouth every 6 (six) hours as needed.   polyethylene glycol powder 17 GM/SCOOP powder Commonly known as: GLYCOLAX /MIRALAX   Take 1 capful (17 g) by mouth daily.   PRENATAL VITAMIN PO Take 1 tablet by mouth daily.   Senna-S 8.6-50 MG tablet Generic drug: senna-docusate Take 2 tablets by mouth at bedtime as needed for moderate constipation.   simethicone  80 MG chewable tablet Commonly known as: MYLICON Chew 1 tablet (80 mg total) by mouth as needed for flatulence.        Follow-up Information     [facility] Follow up on 03/03/2023.   Specialty: Obstetrics and Gynecology Why: in office BP check, nurse visit Contact information: [address], [address] [place]  [address] [phone]                Signed: Vonn VEAR Inch 02/25/2023, 10:01 AM

## 2023-02-28 LAB — BPAM RBC
Blood Product Expiration Date: 202502212359
Blood Product Expiration Date: 202503152359
Unit Type and Rh: 600
Unit Type and Rh: 6200

## 2023-02-28 LAB — TYPE AND SCREEN
ABO/RH(D): A POS
Antibody Screen: POSITIVE
Unit division: 0
Unit division: 0

## 2023-03-03 ENCOUNTER — Encounter: Payer: BC Managed Care – PPO | Admitting: Licensed Clinical Social Worker

## 2023-03-03 ENCOUNTER — Other Ambulatory Visit: Payer: Self-pay | Admitting: General Practice

## 2023-03-03 DIAGNOSIS — O24912 Unspecified diabetes mellitus in pregnancy, second trimester: Secondary | ICD-10-CM

## 2023-03-03 MED ORDER — METFORMIN HCL 500 MG PO TABS: 1000.0000 mg | ORAL_TABLET | Freq: Every day | ORAL | 5 refills | Status: DC

## 2023-03-03 NOTE — Progress Notes (Signed)
Rx for Metformin sent to pharmacy.

## 2023-03-15 ENCOUNTER — Ambulatory Visit: Payer: BC Managed Care – PPO | Admitting: Obstetrics & Gynecology

## 2023-03-15 ENCOUNTER — Encounter: Payer: Self-pay | Admitting: Obstetrics & Gynecology

## 2023-03-15 VITALS — BP 138/84 | HR 86 | Wt 278.0 lb

## 2023-03-15 DIAGNOSIS — E1169 Type 2 diabetes mellitus with other specified complication: Secondary | ICD-10-CM

## 2023-03-15 DIAGNOSIS — Z3202 Encounter for pregnancy test, result negative: Secondary | ICD-10-CM | POA: Diagnosis not present

## 2023-03-15 DIAGNOSIS — Z1339 Encounter for screening examination for other mental health and behavioral disorders: Secondary | ICD-10-CM

## 2023-03-15 DIAGNOSIS — Z98891 History of uterine scar from previous surgery: Secondary | ICD-10-CM

## 2023-03-15 DIAGNOSIS — Z7984 Long term (current) use of oral hypoglycemic drugs: Secondary | ICD-10-CM | POA: Diagnosis not present

## 2023-03-15 DIAGNOSIS — Z30017 Encounter for initial prescription of implantable subdermal contraceptive: Secondary | ICD-10-CM | POA: Diagnosis not present

## 2023-03-15 DIAGNOSIS — O10919 Unspecified pre-existing hypertension complicating pregnancy, unspecified trimester: Secondary | ICD-10-CM

## 2023-03-15 LAB — POCT URINE PREGNANCY: Preg Test, Ur: NEGATIVE

## 2023-03-15 MED ORDER — ETONOGESTREL 68 MG ~~LOC~~ IMPL
68.0000 mg | DRUG_IMPLANT | Freq: Once | SUBCUTANEOUS | Status: AC
  Administered 2023-03-15: 68 mg via SUBCUTANEOUS

## 2023-03-15 NOTE — Progress Notes (Signed)
 GYNECOLOGY OFFICE PROCEDURE NOTE  Marilyn Cooper is a 33 y.o. [id number] here for Nexplanon insertion.  Last pap smear was on 2022 and was normal.  No other gynecologic concerns.  Nexplanon Insertion Procedure Patient identified, informed consent performed, consent signed.   Patient does understand that irregular bleeding is a very common side effect of this medication. She was advised to have backup contraception for one week after placement. Pregnancy test in clinic today was negative.  Appropriate time out taken.  Patient's left arm was prepped and draped in the usual sterile fashion. The ruler used to measure and mark insertion area.  Patient was prepped with alcohol swab and then injected with 3 ml of 1% lidocaine.  She was prepped with betadine, Nexplanon removed from packaging,  Device confirmed in needle, then inserted full length of needle and withdrawn per handbook instructions. Nexplanon was able to palpated in the patient's arm; patient palpated the insert herself. There was minimal blood loss.  Patient insertion site covered with gauze and a pressure bandage to reduce any bruising.  The patient tolerated the procedure well and was given post procedure instructions.    Adam Phenix, MD Attending Obstetrician & Gynecologist, [facility]  03/15/2023

## 2023-03-15 NOTE — Progress Notes (Signed)
 Arlie Solomons Partum Visit Note  MURDIS FLITTON is a 33 y.o. [phone] female who presents for a postpartum visit. She is 5 weeks postpartum following a repeat cesarean section.  I have fully reviewed the prenatal and intrapartum course. The delivery was at 37.1 gestational weeks.  Anesthesia: spinal. Postpartum course has been good. Baby is doing well yes. Baby is feeding by both breast and bottle - Similac Sensitive RS. Bleeding moderate lochia. Bowel function is normal. Bladder function is normal. Patient is not sexually active. Contraception method is Nexplanon. Postpartum depression screening: positive.   The pregnancy intention screening data noted above was reviewed. Potential methods of contraception were discussed. The patient elected to proceed with No data recorded.    Health Maintenance Due  Topic Date Due   Pneumococcal Vaccine 66-5 Years old (1 of 2 - PCV) Never done   FOOT EXAM  Never done   OPHTHALMOLOGY EXAM  Never done   Diabetic kidney evaluation - Urine ACR  08/28/2020   INFLUENZA VACCINE  Never done   COVID-19 Vaccine (2 - 2024-25 season) 09/18/2022   HEMOGLOBIN A1C  02/23/2023    The following portions of the patient's history were reviewed and updated as appropriate: allergies, current medications, past family history, past medical history, past social history, past surgical history, and problem list.  Review of Systems Behavioral/Psych: positive for anxiety, bad mood, and depression  Objective:  BP (!) 148/82   Pulse 86   Breastfeeding Yes    General:  alert, cooperative, and no distress   Breasts:  not indicated  Lungs:   Heart:  regular rate and rhythm  Abdomen: soft, non-tender; bowel sounds normal; no masses,  no organomegaly   Wound well approximated incision  GU exam:  not indicated       Assessment:    There are no diagnoses linked to this encounter.  5 week postpartum exam with positive depression screen. Increase Lexapro to 20 mg and make  referral to [facility]  Plan:   Essential components of care per ACOG recommendations:  1.  Mood and well being: Patient with positive depression screening today. Reviewed local resources for support.  - Patient tobacco use? No.   - hx of drug use? No.    2. Infant care and feeding:  -Patient currently breastmilk feeding? Yes. Discussed returning to work and pumping.  -Social determinants of health (SDOH) reviewed in EPIC. No concerns 3. Sexuality, contraception and birth spacing - Patient does not want a pregnancy in the next year.  Desired family size is 4 children.  - Reviewed reproductive life planning. Reviewed contraceptive methods based on pt preferences and effectiveness.  Patient desired Hormonal Implant today.   - Discussed birth spacing of 18 months  4. Sleep and fatigue -Encouraged family/partner/community support of 4 hrs of uninterrupted sleep to help with mood and fatigue  5. Physical Recovery  - Discussed patients delivery and complications. She describes her labor as mixed. - Patient had a  postoperative ileus after CS .  - Patient has urinary incontinence? No. - Patient is safe to resume physical and sexual activity  6.  Health Maintenance - HM due items addressed Yes - Last pap smear  Diagnosis  Date Value Ref Range Status  11/04/2020   Final   - Negative for intraepithelial lesion or malignancy (NILM)   Pap smear not done at today's visit.  -Breast Cancer screening indicated? No.   7. Chronic Disease/Pregnancy Condition follow up: Hypertension and Diabetes [employer]  6 weeks Lovenox  - PCP follow up  Adam Phenix, MD  [facility] for [facility], [facility]

## 2023-05-18 ENCOUNTER — Other Ambulatory Visit (HOSPITAL_COMMUNITY): Payer: Self-pay

## 2023-06-09 DIAGNOSIS — R059 Cough, unspecified: Secondary | ICD-10-CM | POA: Diagnosis not present

## 2023-06-09 DIAGNOSIS — J189 Pneumonia, unspecified organism: Secondary | ICD-10-CM | POA: Diagnosis not present

## 2023-06-09 DIAGNOSIS — R062 Wheezing: Secondary | ICD-10-CM | POA: Diagnosis not present

## 2023-06-25 ENCOUNTER — Other Ambulatory Visit: Payer: Self-pay | Admitting: Obstetrics & Gynecology

## 2023-06-25 DIAGNOSIS — F411 Generalized anxiety disorder: Secondary | ICD-10-CM

## 2024-01-19 ENCOUNTER — Ambulatory Visit: Admitting: Family Medicine

## 2024-01-19 ENCOUNTER — Encounter: Payer: Self-pay | Admitting: Family Medicine

## 2024-01-19 DIAGNOSIS — Z7984 Long term (current) use of oral hypoglycemic drugs: Secondary | ICD-10-CM

## 2024-01-19 DIAGNOSIS — I10 Essential (primary) hypertension: Secondary | ICD-10-CM

## 2024-01-19 DIAGNOSIS — F419 Anxiety disorder, unspecified: Secondary | ICD-10-CM | POA: Diagnosis not present

## 2024-01-19 DIAGNOSIS — Z6841 Body Mass Index (BMI) 40.0 and over, adult: Secondary | ICD-10-CM | POA: Diagnosis not present

## 2024-01-19 DIAGNOSIS — E1169 Type 2 diabetes mellitus with other specified complication: Secondary | ICD-10-CM | POA: Diagnosis not present

## 2024-01-19 DIAGNOSIS — K439 Ventral hernia without obstruction or gangrene: Secondary | ICD-10-CM

## 2024-01-19 LAB — CBC WITH DIFFERENTIAL/PLATELET
Basophils Absolute: 0 K/uL (ref 0.0–0.1)
Basophils Relative: 0.3 % (ref 0.0–3.0)
Eosinophils Absolute: 0 K/uL (ref 0.0–0.7)
Eosinophils Relative: 0.3 % (ref 0.0–5.0)
HCT: 38 % (ref 36.0–46.0)
Hemoglobin: 12.4 g/dL (ref 12.0–15.0)
Lymphocytes Relative: 21.7 % (ref 12.0–46.0)
Lymphs Abs: 2.1 K/uL (ref 0.7–4.0)
MCHC: 32.6 g/dL (ref 30.0–36.0)
MCV: 77.6 fl — ABNORMAL LOW (ref 78.0–100.0)
Monocytes Absolute: 0.5 K/uL (ref 0.1–1.0)
Monocytes Relative: 5.6 % (ref 3.0–12.0)
Neutro Abs: 7 K/uL (ref 1.4–7.7)
Neutrophils Relative %: 72.1 % (ref 43.0–77.0)
Platelets: 360 K/uL (ref 150.0–400.0)
RBC: 4.9 Mil/uL (ref 3.87–5.11)
RDW: 15.3 % (ref 11.5–15.5)
WBC: 9.7 K/uL (ref 4.0–10.5)

## 2024-01-19 LAB — COMPREHENSIVE METABOLIC PANEL WITH GFR
ALT: 15 U/L (ref 3–35)
AST: 14 U/L (ref 5–37)
Albumin: 4.2 g/dL (ref 3.5–5.2)
Alkaline Phosphatase: 72 U/L (ref 39–117)
BUN: 11 mg/dL (ref 6–23)
CO2: 28 meq/L (ref 19–32)
Calcium: 8.7 mg/dL (ref 8.4–10.5)
Chloride: 102 meq/L (ref 96–112)
Creatinine, Ser: 0.58 mg/dL (ref 0.40–1.20)
GFR: 119.32 mL/min
Glucose, Bld: 133 mg/dL — ABNORMAL HIGH (ref 70–99)
Potassium: 4 meq/L (ref 3.5–5.1)
Sodium: 138 meq/L (ref 135–145)
Total Bilirubin: 0.2 mg/dL (ref 0.2–1.2)
Total Protein: 7.4 g/dL (ref 6.0–8.3)

## 2024-01-19 LAB — LIPID PANEL
Cholesterol: 157 mg/dL (ref 28–200)
HDL: 37.9 mg/dL — ABNORMAL LOW
LDL Cholesterol: 76 mg/dL (ref 10–99)
NonHDL: 119.01
Total CHOL/HDL Ratio: 4
Triglycerides: 217 mg/dL — ABNORMAL HIGH (ref 10.0–149.0)
VLDL: 43.4 mg/dL — ABNORMAL HIGH (ref 0.0–40.0)

## 2024-01-19 LAB — T4, FREE: Free T4: 0.72 ng/dL (ref 0.60–1.60)

## 2024-01-19 LAB — HEMOGLOBIN A1C: Hgb A1c MFr Bld: 6.9 % — ABNORMAL HIGH (ref 4.6–6.5)

## 2024-01-19 LAB — TSH: TSH: 1.44 u[IU]/mL (ref 0.35–5.50)

## 2024-01-19 MED ORDER — AMLODIPINE BESYLATE 10 MG PO TABS: 10.0000 mg | ORAL_TABLET | Freq: Every day | ORAL | 2 refills | Status: AC

## 2024-01-19 MED ORDER — ESCITALOPRAM OXALATE 20 MG PO TABS: 20.0000 mg | ORAL_TABLET | Freq: Every day | ORAL | 3 refills | Status: DC

## 2024-01-19 MED ORDER — LISINOPRIL 30 MG PO TABS: 30.0000 mg | ORAL_TABLET | Freq: Every day | ORAL | 0 refills | Status: AC

## 2024-01-19 MED ORDER — METFORMIN HCL 500 MG PO TABS: 1000.0000 mg | ORAL_TABLET | Freq: Every day | ORAL | 5 refills | Status: AC

## 2024-01-19 NOTE — Progress Notes (Signed)
 "  Subjective:     Patient ID: Marilyn Cooper, female    DOB: 06-Jan-1991, 34 y.o.   MRN: [id number]  Chief Complaint  Patient presents with   Medical Management of Chronic Issues    Would like letter for gastric surgery, has papers with her  Softball size hernia in bellybutton area, needs surgery in order to have hernia repair     HPI  Discussed the use of AI scribe software for clinical note transcription with the patient, who gave verbal consent to proceed.  History of Present Illness Marilyn Cooper is a 34 year old female who presents requesting a bariatric surgery letter.  Obesity and bariatric surgery evaluation - Obesity with need for bariatric surgery to facilitate weight loss prior to hernia repair - Completed online bariatric surgery seminar and required paperwork  Ventral hernia and abdominal pain - Ventral hernia initially repaired in 2016, with recurrence and progressive worsening, especially after most recent pregnancy - Large abdominal bulge approximately 6x6 inches, painful and worsened by coughing - Pain is significant, limits daily activities, and impairs ability to care for children, requiring modification of activities - Severe hernia pain during most recent pregnancy required hospitalization  Glycemic control - History of gestational diabetes with normalization of blood sugars postpartum - Most recent hemoglobin A1c was 7 - Not currently on diabetes medication - No recent laboratory studies  Hypertension - History of hypertension - Previously used lisinopril , discontinued when prescription was not renewed - Occasionally takes labetalol  when perceiving elevated blood pressure  Mood disorders - Anxiety and depression - Takes Lexapro , requires new prescription  Functional status and social impact - Mother of five; youngest child born January 2025 - Stopped breastfeeding six months ago - Uses vasectomy and Nexplanon  for contraception - Due to hernia  pain, 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 Moderate ventral hernia containing mesenteric fat small bowel and transverse colon but no evidence for obstruction or incarceration. Adjacent smaller fat containing ventral hernia. 2. Hepatosplenomegaly with probable steatosis 3. Focal narrowing at the rectosigmoid colon indeterminate for focal spasm or stricture. Follow-up outpatient colonoscopy may be considered.   Health Maintenance Due  Topic Date Due   FOOT EXAM  Never done   OPHTHALMOLOGY EXAM  Never done   Diabetic kidney evaluation - Urine ACR  Never done   Pneumococcal Vaccine (1 of 2 - PCV) Never done   Hepatitis B Vaccines 19-59 Average Risk (1 of 3 - 19+ 3-dose series) Never done   HPV VACCINES (1 - 3-dose SCDM series) Never done   COVID-19 Vaccine (2 - Mixed Product risk series) 11/06/2019   Influenza Vaccine  Never done   Cervical Cancer Screening (HPV/Pap Cotest)  11/05/2023    Past Medical History:  Diagnosis Date   Anemia    Anxiety    Asthma    childhood   Binge eating disorder 07/05/2022   Candidiasis of skin 07/05/2022   Chronic hypertension    Depressed mood 04/29/2020   Depression    see social work consult, doing really well right now 'is in a good place 3/29   History of pre-eclampsia 07/05/2022   G1 39w, severe g2 @ w 36w delivery, ASA 81mg    Hx of varicella    Major depressive disorder with single episode, 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 Okey,  MD;  Location: [facility];  Service: Obstetrics;  Laterality: N/A;   CESAREAN SECTION N/A 12/03/2014   Procedure: CESAREAN SECTION;  Surgeon: Jolene Gaskins, MD;  Location: [facility];  Service: Obstetrics;  Laterality: N/A;   CESAREAN SECTION N/A 08/29/2019   Procedure: CESAREAN SECTION;  Surgeon: Okey Leader, MD;  Location: [facility];  Service: Obstetrics;  Laterality: N/A;   CESAREAN SECTION N/A 05/04/2021   Procedure: CESAREAN SECTION;  Surgeon: Zina Jerilynn LABOR, MD;  Location: [facility];  Service: Obstetrics;  Laterality: N/A;   CESAREAN SECTION N/A 02/06/2023   Procedure: CESAREAN SECTION;  Surgeon: Barbra Lang PARAS, DO;  Location: [facility];  Service: Obstetrics;  Laterality: N/A;   COLPOSCOPY     CYSTOSCOPY N/A 08/29/2019   Procedure: CYSTOSCOPY;  Surgeon: Okey Leader, MD;  Location: [facility];  Service: Gynecology;  Laterality: N/A;   LAPAROTOMY N/A 08/29/2019   Procedure: EXPLORATORY LAPAROTOMY;  Surgeon: Okey Leader, MD;  Location: [facility];  Service: Gynecology;  Laterality: N/A;   TONSILLECTOMY     UMBILICAL HERNIA REPAIR N/A 06/21/2014   Procedure: EXPLORATORY LAPAROTOMY,  SMALL BOWEL RESECTION, PRIMARY REPAIR INCARCERATED VENTRAL HERNIA;  Surgeon: Krystal Spinner, MD;  Location: [facility];  Service: General;  Laterality: N/A;    Family History  Problem Relation Age of Onset   Anemia Mother    Hypertension Mother    Vision loss Father    Diabetes Father    Anemia Sister    Birth defects Brother        heart disorder?   ADD / ADHD Brother    Hypertension Maternal Grandmother    Diabetes Maternal Grandmother    Hypertension Maternal Grandfather    Diabetes Maternal Grandfather    Hypertension Paternal Grandmother    Diabetes Paternal Grandmother    Hypertension Paternal Grandfather    Diabetes Paternal Grandfather    Hypertension Maternal Aunt    Stomach cancer Neg Hx    Colon cancer Neg Hx    Esophageal cancer Neg Hx    Pancreatic disease Neg Hx     Social History    Socioeconomic History   Marital status: Married    Spouse name: Not on file   Number of children: Not on file   Years of education: Not on file   Highest education level: Some college, no degree  Occupational History   Not on file  Tobacco Use   Smoking status: Never   Smokeless tobacco: Never  Vaping Use   Vaping status: Never Used  Substance and Sexual Activity   Alcohol use: No   Drug use: No   Sexual activity: Not Currently    Birth control/protection: Condom, Rhythm  Other Topics Concern   Not on file  Social History Narrative   Not on file   Social Drivers of Health   Tobacco Use: Low Risk (01/19/2024)   Patient History    Smoking Tobacco Use: Never    Smokeless Tobacco Use: Never    Passive Exposure: Not on file  Financial Resource Strain: Low Risk (01/16/2024)   Overall Financial Resource Strain (CARDIA)  Difficulty of Paying Living Expenses: Not very hard  Food Insecurity: No Food Insecurity (01/16/2024)   Epic    Worried About Programme Researcher, Broadcasting/film/video in the Last Year: Never true    Ran Out of Food in the Last Year: Never true  Transportation Needs: No Transportation Needs (01/16/2024)   Epic    Lack of Transportation (Medical): No    Lack of Transportation (Non-Medical): No  Physical Activity: Insufficiently Active (01/16/2024)   Exercise Vital Sign    Days of Exercise per Week: 4 days    Minutes of Exercise per Session: 20 min  Stress: Stress Concern Present (01/16/2024)   [employer] of Occupational Health - Occupational Stress Questionnaire    Feeling of Stress: Rather much  Social Connections: Moderately Integrated (01/16/2024)   Social Connection and Isolation Panel    Frequency of Communication with Friends and Family: More than three times a week    Frequency of Social Gatherings with Friends and Family: Once a week    Attends Religious Services: 1 to 4 times per year    Active Member of [employer] or Organizations: No    Attends Museum/gallery Exhibitions Officer: Not on file    Marital Status: Married  Catering Manager Violence: Not At Risk (02/23/2023)   Humiliation, Afraid, Rape, and Kick questionnaire    Fear of Current or Ex-Partner: No    Emotionally Abused: No    Physically Abused: No    Sexually Abused: No  Depression (PHQ2-9): Medium Risk (01/19/2024)   Depression (PHQ2-9)    PHQ-2 Score: 8  Alcohol Screen: Low Risk (01/16/2024)   Alcohol Screen    Last Alcohol Screening Score (AUDIT): 0  Housing: Low Risk (01/16/2024)   Epic    Unable to Pay for Housing in the Last Year: No    Number of Times Moved in the Last Year: 0    Homeless in the Last Year: No  Utilities: Not At Risk (02/23/2023)   A[facility] Utilities    Threatened with loss of utilities: No  Health Literacy: Not on file    Outpatient Medications Prior to Visit  Medication Sig Dispense Refill   cetirizine (ZYRTEC) 10 MG tablet Take 10 mg by mouth daily as needed for allergies.     Etonogestrel  (NEXPLANON  [place]) Inject into the skin.     ibuprofen  (ADVIL ) 600 MG tablet Take 1 tablet (600 mg total) by mouth every 6 (six) hours as needed for mild pain (pain score 1-3). 30 tablet 0   labetalol  (NORMODYNE ) 300 MG tablet Take 2 tablets (600 mg total) by mouth every 8 (eight) hours. 180 tablet 2   Multiple Vitamin (MULTIVITAMIN PO) Take by mouth.     simethicone  (MYLICON) 80 MG chewable tablet Chew 1 tablet (80 mg total) by mouth as needed for flatulence. 30 tablet 0   nystatin  (MYCOSTATIN /NYSTOP ) powder Apply topically 2 (two) times daily. (Patient not taking: Reported on 01/19/2024) 15 g 11   amLODipine  (NORVASC ) 10 MG tablet Take 1 tablet (10 mg total) by mouth daily. (Patient not taking: Reported on 01/19/2024) 30 tablet 2   enoxaparin  (LOVENOX ) 120 MG/0.8ML injection Inject 0.8 mLs (120 mg total) into the skin every 12 (twelve) hours. 67.2 mL 0   escitalopram  (LEXAPRO ) 20 MG tablet Take 1 tablet (20 mg total) by mouth daily. (Patient not taking: Reported on 01/19/2024) 30  tablet 3   lisinopril  (ZESTRIL ) 30 MG tablet Take 1 tablet (30 mg total) by mouth daily. (Patient not taking: Reported on  01/19/2024) 90 tablet 0   metFORMIN  (GLUCOPHAGE ) 500 MG tablet Take 2 tablets (1,000 mg total) by mouth daily with breakfast. (Patient not taking: Reported on 01/19/2024) 60 tablet 5   oxyCODONE -acetaminophen  (PERCOCET/ROXICET) 5-325 MG tablet Take 1-2 tablets by mouth every 6 (six) hours as needed. (Patient not taking: Reported on 03/15/2023) 30 tablet 0   Prenatal Vit-Fe Fumarate-FA (PRENATAL VITAMIN PO) Take 1 tablet by mouth daily.     senna-docusate (SENOKOT-S) 8.6-50 MG tablet Take 2 tablets by mouth at bedtime as needed for moderate constipation. (Patient not taking: Reported on 03/15/2023) 14 tablet 0   No facility-administered medications prior to visit.    Allergies[1]  Review of Systems  Constitutional:  Negative for chills, fever, malaise/fatigue and weight loss.  Respiratory:  Negative for shortness of breath.   Cardiovascular:  Negative for chest pain, palpitations and leg swelling.  Gastrointestinal:  Positive for abdominal pain. Negative for constipation, diarrhea, nausea and vomiting.       Large hernia  Genitourinary:  Negative for dysuria, frequency and urgency.  Neurological:  Negative for dizziness, focal weakness and headaches.  Psychiatric/Behavioral:  Negative for depression and suicidal ideas. The patient is nervous/anxious.        Objective:    Physical Exam Constitutional:      General: She is not in acute distress.    Appearance: She is obese. She is not ill-appearing.  HENT:     Mouth/Throat:     Mouth: Mucous membranes are moist.     Pharynx: Oropharynx is clear.  Eyes:     Extraocular Movements: Extraocular movements intact.     Conjunctiva/sclera: Conjunctivae normal.  Cardiovascular:     Rate and Rhythm: Normal rate.  Pulmonary:     Effort: Pulmonary effort is normal.  Abdominal:     General: Bowel sounds are normal.      Palpations: Abdomen is soft.     Tenderness: There is abdominal tenderness. There is no guarding or rebound.     Hernia: A hernia is present. Hernia is present in the ventral area.     Comments: Large ventral hernia, no sign of obstruction or gangrene  Musculoskeletal:     Cervical back: Normal range of motion and neck supple.  Skin:    General: Skin is warm and dry.  Neurological:     General: No focal deficit present.     Mental Status: She is alert and oriented to person, place, and time.  Psychiatric:        Mood and Affect: Mood normal.        Behavior: Behavior normal.        Thought Content: Thought content normal.      BP 132/84   Pulse 95   Temp 97.6 F (36.4 C) (Temporal)   Ht 5' 3 (1.6 m)   Wt (!) 305 lb (138.3 kg)   SpO2 98%   Breastfeeding No   BMI 54.03 kg/m  Wt Readings from Last 3 Encounters:  01/19/24 (!) 305 lb (138.3 kg)  03/15/23 278 lb (126.1 kg)  02/23/23 276 lb 4.8 oz (125.3 kg)       Assessment & Plan:   Problem List Items Addressed This Visit   None Visit Diagnoses       Severe obesity (BMI >= 40) (HCC)    -  Primary   Relevant Medications   metFORMIN  (GLUCOPHAGE ) 500 MG tablet   Other Relevant Orders   CBC with Differential/Platelet (Completed)   Comprehensive metabolic panel  with GFR (Completed)   Hemoglobin A1c (Completed)   Lipid panel (Completed)   TSH (Completed)   T4, free (Completed)     Ventral hernia without obstruction or gangrene       Relevant Orders   Ambulatory referral to General Surgery     Type 2 diabetes mellitus associated with morbid obesity (HCC)       Relevant Medications   metFORMIN  (GLUCOPHAGE ) 500 MG tablet   lisinopril  (ZESTRIL ) 30 MG tablet   Other Relevant Orders   CBC with Differential/Platelet (Completed)   Comprehensive metabolic panel with GFR (Completed)   Hemoglobin A1c (Completed)   Lipid panel (Completed)   TSH (Completed)   T4, free (Completed)     Anxiety       Relevant Medications    escitalopram  (LEXAPRO ) 20 MG tablet     Primary hypertension       Relevant Medications   amLODipine  (NORVASC ) 10 MG tablet   lisinopril  (ZESTRIL ) 30 MG tablet   Other Relevant Orders   CBC with Differential/Platelet (Completed)   Comprehensive metabolic panel with GFR (Completed)   TSH (Completed)   T4, free (Completed)       Assessment and Plan Assessment & Plan Ventral hernia Recurrent ventral hernia at the umbilical region, previously repaired in 2016, with significant enlargement and symptoms including pain. Enlarging hernia causing significant lifestyle limitations. Previous advice to lose weight before repair, but current size and symptoms suggest urgency for surgical intervention. - Referred to [facility] for evaluation and potential repair of the ventral hernia. - Instructed to discuss with surgery team the possibility of concurrent bariatric surgery if indicated.  Severe obesity (BMI = 40) Severe obesity with BMI = 40, contributing to ventral hernia and other comorbidities. She is interested in bariatric surgery to aid in weight loss and hernia repair. Previous attempts to lose weight have been unsuccessful, and insurance does not cover GLP-1 agonists. - Referred to [facility] for consultation regarding bariatric surgery. - Checked labs to assess current health status.   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 Previous A1c was 7, indicating diabetes. She has not been on consistent medication due to lack of follow-up post-pregnancy. Insurance does not cover GLP-1 agonists, and she is not currently on any diabetes medication. - Checked labs to assess current blood glucose levels and A1c. - Discussed potential need for diabetes medication based on lab results.  Chronic hypertension Chronic hypertension, previously managed with labetalol  and amlodipine . She is not currently on  consistent antihypertensive therapy due to lack of prescription. Blood pressure management is crucial given comorbidities. - Prescribed lisinopril  for hypertension management.  Anxiety disorder Anxiety disorder, currently managed with Lexapro . She reports needing a new prescription for anxiety management. - Refilled Lexapro  prescription for anxiety management.     I have discontinued [facility] [employer] [facility] Prenatal Vit-Fe Fumarate-FA (PRENATAL VITAMIN PO), senna-docusate, oxyCODONE -acetaminophen , and enoxaparin . I am also having her maintain her cetirizine, ibuprofen , simethicone , labetalol , nystatin , Multiple Vitamin (MULTIVITAMIN PO), Etonogestrel  (NEXPLANON  [place]), amLODipine , escitalopram , metFORMIN , and lisinopril .  Meds ordered this encounter  Medications   amLODipine  (NORVASC ) 10 MG tablet    Sig: Take 1 tablet (10 mg total) by mouth daily.    Dispense:  30 tablet    Refill:  2    Supervising Provider:   ROLLENE NORRIS A [[id number]]   escitalopram  (LEXAPRO ) 20 MG tablet    Sig: Take 1 tablet (20 mg total) by mouth  daily.    Dispense:  30 tablet    Refill:  3    Supervising Provider:   ROLLENE NORRIS A [[id number]]   metFORMIN  (GLUCOPHAGE ) 500 MG tablet    Sig: Take 2 tablets (1,000 mg total) by mouth daily with breakfast.    Dispense:  60 tablet    Refill:  5    Supervising Provider:   ROLLENE NORRIS A [[id number]]   lisinopril  (ZESTRIL ) 30 MG tablet    Sig: Take 1 tablet (30 mg total) by mouth daily.    Dispense:  90 tablet    Refill:  0    Supervising Provider:   ROLLENE NORRIS A [[id number]]       [1]  Allergies Allergen Reactions   Procardia  [Nifedipine ] Itching and Rash    Intolerance, pt reports itching and facial/neck redness   "

## 2024-01-19 NOTE — Patient Instructions (Signed)
 Please go downstairs for labs before you leave.   Restart your medications and monitor your blood pressure and blood sugars at home.

## 2024-01-23 ENCOUNTER — Ambulatory Visit: Payer: Self-pay | Admitting: Family Medicine

## 2024-01-26 ENCOUNTER — Encounter: Admitting: Family Medicine

## 2024-02-10 ENCOUNTER — Other Ambulatory Visit: Payer: Self-pay | Admitting: Family Medicine

## 2024-02-16 ENCOUNTER — Encounter: Payer: Self-pay | Admitting: *Deleted

## 2024-02-16 NOTE — Progress Notes (Signed)
 Marilyn Cooper                                          MRN: [id number]   02/16/2024   The [facility] Quality Team Specialist reviewed this patient medical record for the purposes of chart review for care gap closure. The following were reviewed: chart review for care gap closure-glycemic status assessment and kidney health evaluation for diabetes:eGFR  and uACR.    [facility] Quality Team

## 2024-04-18 ENCOUNTER — Ambulatory Visit: Admitting: Family Medicine
